# Patient Record
Sex: Female | Born: 1980
Health system: Southern US, Community
[De-identification: ages and names within clinical notes are randomized; demographics above are authoritative.]

## PROBLEM LIST (undated history)

## (undated) ENCOUNTER — Inpatient Hospital Stay (HOSPITAL_COMMUNITY): Payer: Self-pay

## (undated) DIAGNOSIS — Z9889 Other specified postprocedural states: Secondary | ICD-10-CM

## (undated) DIAGNOSIS — K219 Gastro-esophageal reflux disease without esophagitis: Secondary | ICD-10-CM

## (undated) DIAGNOSIS — IMO0002 Reserved for concepts with insufficient information to code with codable children: Secondary | ICD-10-CM

## (undated) DIAGNOSIS — I509 Heart failure, unspecified: Secondary | ICD-10-CM

## (undated) DIAGNOSIS — Z8489 Family history of other specified conditions: Secondary | ICD-10-CM

## (undated) DIAGNOSIS — J9690 Respiratory failure, unspecified, unspecified whether with hypoxia or hypercapnia: Secondary | ICD-10-CM

## (undated) DIAGNOSIS — J45909 Unspecified asthma, uncomplicated: Secondary | ICD-10-CM

## (undated) DIAGNOSIS — F419 Anxiety disorder, unspecified: Secondary | ICD-10-CM

## (undated) DIAGNOSIS — R112 Nausea with vomiting, unspecified: Secondary | ICD-10-CM

## (undated) DIAGNOSIS — J42 Unspecified chronic bronchitis: Secondary | ICD-10-CM

## (undated) DIAGNOSIS — G709 Myoneural disorder, unspecified: Secondary | ICD-10-CM

## (undated) DIAGNOSIS — G8929 Other chronic pain: Secondary | ICD-10-CM

## (undated) DIAGNOSIS — M549 Dorsalgia, unspecified: Secondary | ICD-10-CM

## (undated) DIAGNOSIS — T4145XA Adverse effect of unspecified anesthetic, initial encounter: Secondary | ICD-10-CM

## (undated) DIAGNOSIS — G43909 Migraine, unspecified, not intractable, without status migrainosus: Secondary | ICD-10-CM

## (undated) DIAGNOSIS — C539 Malignant neoplasm of cervix uteri, unspecified: Secondary | ICD-10-CM

## (undated) DIAGNOSIS — J189 Pneumonia, unspecified organism: Secondary | ICD-10-CM

## (undated) DIAGNOSIS — T8859XA Other complications of anesthesia, initial encounter: Secondary | ICD-10-CM

## (undated) HISTORY — PX: CERVICAL CONE BIOPSY: SUR198

## (undated) HISTORY — PX: KNEE ARTHROSCOPY: SHX127

## (undated) HISTORY — PX: ANTERIOR LUMBAR FUSION: SHX1170

## (undated) HISTORY — PX: TONSILLECTOMY: SUR1361

## (undated) HISTORY — PX: LUMBAR FUSION: SHX111

## (undated) HISTORY — PX: ADENOIDECTOMY, TONSILLECTOMY AND MYRINGOTOMY WITH TUBE PLACEMENT: SHX5716

## (undated) HISTORY — DX: Respiratory failure, unspecified, unspecified whether with hypoxia or hypercapnia: J96.90

---
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
## 1998-03-04 ENCOUNTER — Emergency Department (HOSPITAL_COMMUNITY): Admission: EM | Admit: 1998-03-04 | Discharge: 1998-03-04 | Payer: Self-pay | Admitting: Emergency Medicine

## 1998-03-04 ENCOUNTER — Encounter: Payer: Self-pay | Admitting: Emergency Medicine

## 1998-05-22 ENCOUNTER — Emergency Department (HOSPITAL_COMMUNITY): Admission: EM | Admit: 1998-05-22 | Discharge: 1998-05-22 | Payer: Self-pay | Admitting: Emergency Medicine

## 1998-05-22 ENCOUNTER — Encounter: Payer: Self-pay | Admitting: Emergency Medicine

## 1998-06-01 ENCOUNTER — Encounter: Payer: Self-pay | Admitting: Neurology

## 1998-06-01 ENCOUNTER — Ambulatory Visit (HOSPITAL_COMMUNITY): Admission: RE | Admit: 1998-06-01 | Discharge: 1998-06-01 | Payer: Self-pay | Admitting: Neurology

## 1998-06-15 HISTORY — PX: LUMBAR DISC SURGERY: SHX700

## 1998-07-06 ENCOUNTER — Inpatient Hospital Stay (HOSPITAL_COMMUNITY): Admission: RE | Admit: 1998-07-06 | Discharge: 1998-07-09 | Payer: Self-pay | Admitting: Neurosurgery

## 1998-09-14 ENCOUNTER — Encounter: Admission: RE | Admit: 1998-09-14 | Discharge: 1998-12-13 | Payer: Self-pay | Admitting: Neurosurgery

## 1998-10-10 ENCOUNTER — Ambulatory Visit (HOSPITAL_COMMUNITY): Admission: RE | Admit: 1998-10-10 | Discharge: 1998-10-10 | Payer: Self-pay | Admitting: Neurosurgery

## 1998-10-26 ENCOUNTER — Inpatient Hospital Stay (HOSPITAL_COMMUNITY): Admission: RE | Admit: 1998-10-26 | Discharge: 1998-10-30 | Payer: Self-pay | Admitting: Neurosurgery

## 1998-11-10 ENCOUNTER — Encounter: Admission: RE | Admit: 1998-11-10 | Discharge: 1998-11-10 | Payer: Self-pay | Admitting: Obstetrics & Gynecology

## 1998-12-04 ENCOUNTER — Other Ambulatory Visit: Admission: RE | Admit: 1998-12-04 | Discharge: 1998-12-04 | Payer: Self-pay | Admitting: Obstetrics

## 1998-12-04 ENCOUNTER — Encounter (INDEPENDENT_AMBULATORY_CARE_PROVIDER_SITE_OTHER): Payer: Self-pay

## 1998-12-15 ENCOUNTER — Emergency Department (HOSPITAL_COMMUNITY): Admission: EM | Admit: 1998-12-15 | Discharge: 1998-12-15 | Payer: Self-pay | Admitting: Emergency Medicine

## 1999-01-12 ENCOUNTER — Encounter: Admission: RE | Admit: 1999-01-12 | Discharge: 1999-01-12 | Payer: Self-pay | Admitting: Neurosurgery

## 1999-03-30 ENCOUNTER — Encounter: Admission: RE | Admit: 1999-03-30 | Discharge: 1999-03-30 | Payer: Self-pay | Admitting: Neurosurgery

## 1999-04-09 ENCOUNTER — Encounter: Admission: RE | Admit: 1999-04-09 | Discharge: 1999-04-09 | Payer: Self-pay | Admitting: Obstetrics & Gynecology

## 1999-05-03 ENCOUNTER — Ambulatory Visit (HOSPITAL_COMMUNITY): Admission: RE | Admit: 1999-05-03 | Discharge: 1999-05-03 | Payer: Self-pay | Admitting: Obstetrics & Gynecology

## 1999-05-03 ENCOUNTER — Encounter (INDEPENDENT_AMBULATORY_CARE_PROVIDER_SITE_OTHER): Payer: Self-pay | Admitting: Specialist

## 1999-05-06 ENCOUNTER — Inpatient Hospital Stay (HOSPITAL_COMMUNITY): Admission: EM | Admit: 1999-05-06 | Discharge: 1999-05-06 | Payer: Self-pay | Admitting: Obstetrics & Gynecology

## 1999-05-18 ENCOUNTER — Encounter: Admission: RE | Admit: 1999-05-18 | Discharge: 1999-05-18 | Payer: Self-pay | Admitting: Obstetrics & Gynecology

## 1999-06-17 ENCOUNTER — Ambulatory Visit (HOSPITAL_COMMUNITY): Admission: RE | Admit: 1999-06-17 | Discharge: 1999-06-17 | Payer: Self-pay | Admitting: Neurosurgery

## 1999-07-06 ENCOUNTER — Encounter: Admission: RE | Admit: 1999-07-06 | Discharge: 1999-07-06 | Payer: Self-pay | Admitting: Obstetrics & Gynecology

## 1999-09-30 ENCOUNTER — Encounter: Admission: RE | Admit: 1999-09-30 | Discharge: 1999-09-30 | Payer: Self-pay | Admitting: Obstetrics

## 1999-12-23 ENCOUNTER — Encounter: Admission: RE | Admit: 1999-12-23 | Discharge: 1999-12-23 | Payer: Self-pay | Admitting: Obstetrics & Gynecology

## 1999-12-23 ENCOUNTER — Encounter: Admission: RE | Admit: 1999-12-23 | Discharge: 1999-12-23 | Payer: Self-pay | Admitting: Obstetrics

## 2000-02-01 ENCOUNTER — Encounter: Admission: RE | Admit: 2000-02-01 | Discharge: 2000-02-01 | Payer: Self-pay | Admitting: Obstetrics & Gynecology

## 2000-02-01 ENCOUNTER — Other Ambulatory Visit: Admission: RE | Admit: 2000-02-01 | Discharge: 2000-02-01 | Payer: Self-pay | Admitting: Obstetrics & Gynecology

## 2000-02-28 ENCOUNTER — Encounter (INDEPENDENT_AMBULATORY_CARE_PROVIDER_SITE_OTHER): Payer: Self-pay | Admitting: Specialist

## 2000-02-28 ENCOUNTER — Ambulatory Visit (HOSPITAL_COMMUNITY): Admission: RE | Admit: 2000-02-28 | Discharge: 2000-02-28 | Payer: Self-pay | Admitting: Obstetrics & Gynecology

## 2000-04-21 ENCOUNTER — Encounter: Admission: RE | Admit: 2000-04-21 | Discharge: 2000-04-21 | Payer: Self-pay | Admitting: Obstetrics & Gynecology

## 2000-06-19 ENCOUNTER — Encounter: Payer: Self-pay | Admitting: Emergency Medicine

## 2000-06-19 ENCOUNTER — Emergency Department (HOSPITAL_COMMUNITY): Admission: EM | Admit: 2000-06-19 | Discharge: 2000-06-19 | Payer: Self-pay | Admitting: Emergency Medicine

## 2000-07-13 ENCOUNTER — Encounter: Admission: RE | Admit: 2000-07-13 | Discharge: 2000-07-13 | Payer: Self-pay | Admitting: Obstetrics

## 2000-09-09 ENCOUNTER — Ambulatory Visit (HOSPITAL_COMMUNITY): Admission: RE | Admit: 2000-09-09 | Discharge: 2000-09-09 | Payer: Self-pay | Admitting: Neurosurgery

## 2000-09-26 ENCOUNTER — Ambulatory Visit (HOSPITAL_COMMUNITY): Admission: RE | Admit: 2000-09-26 | Discharge: 2000-09-26 | Payer: Self-pay | Admitting: Neurosurgery

## 2000-10-25 ENCOUNTER — Other Ambulatory Visit: Admission: RE | Admit: 2000-10-25 | Discharge: 2000-10-25 | Payer: Self-pay | Admitting: *Deleted

## 2000-10-27 ENCOUNTER — Encounter: Admission: RE | Admit: 2000-10-27 | Discharge: 2001-01-25 | Payer: Self-pay

## 2001-02-09 ENCOUNTER — Encounter: Admission: RE | Admit: 2001-02-09 | Discharge: 2001-05-10 | Payer: Self-pay | Admitting: Anesthesiology

## 2001-03-13 ENCOUNTER — Encounter: Admission: RE | Admit: 2001-03-13 | Discharge: 2001-03-13 | Payer: Self-pay | Admitting: Obstetrics & Gynecology

## 2001-04-30 ENCOUNTER — Encounter: Payer: Self-pay | Admitting: Emergency Medicine

## 2001-04-30 ENCOUNTER — Emergency Department (HOSPITAL_COMMUNITY): Admission: EM | Admit: 2001-04-30 | Discharge: 2001-04-30 | Payer: Self-pay

## 2001-05-15 ENCOUNTER — Encounter (INDEPENDENT_AMBULATORY_CARE_PROVIDER_SITE_OTHER): Payer: Self-pay | Admitting: *Deleted

## 2001-05-15 ENCOUNTER — Encounter: Admission: RE | Admit: 2001-05-15 | Discharge: 2001-05-15 | Payer: Self-pay | Admitting: *Deleted

## 2001-05-29 ENCOUNTER — Encounter: Admission: RE | Admit: 2001-05-29 | Discharge: 2001-05-29 | Payer: Self-pay | Admitting: *Deleted

## 2001-08-21 ENCOUNTER — Encounter: Admission: RE | Admit: 2001-08-21 | Discharge: 2001-08-21 | Payer: Self-pay | Admitting: *Deleted

## 2001-11-13 ENCOUNTER — Encounter: Admission: RE | Admit: 2001-11-13 | Discharge: 2001-11-13 | Payer: Self-pay | Admitting: *Deleted

## 2002-01-15 ENCOUNTER — Emergency Department (HOSPITAL_COMMUNITY): Admission: EM | Admit: 2002-01-15 | Discharge: 2002-01-15 | Payer: Self-pay | Admitting: Emergency Medicine

## 2002-02-03 ENCOUNTER — Emergency Department (HOSPITAL_COMMUNITY): Admission: EM | Admit: 2002-02-03 | Discharge: 2002-02-03 | Payer: Self-pay | Admitting: Emergency Medicine

## 2002-02-21 ENCOUNTER — Emergency Department (HOSPITAL_COMMUNITY): Admission: EM | Admit: 2002-02-21 | Discharge: 2002-02-21 | Payer: Self-pay | Admitting: Emergency Medicine

## 2002-03-21 ENCOUNTER — Encounter: Payer: Self-pay | Admitting: Emergency Medicine

## 2002-03-21 ENCOUNTER — Emergency Department (HOSPITAL_COMMUNITY): Admission: EM | Admit: 2002-03-21 | Discharge: 2002-03-21 | Payer: Self-pay | Admitting: Emergency Medicine

## 2002-04-04 ENCOUNTER — Other Ambulatory Visit: Admission: RE | Admit: 2002-04-04 | Discharge: 2002-04-04 | Payer: Self-pay | Admitting: Obstetrics and Gynecology

## 2002-04-04 ENCOUNTER — Encounter (INDEPENDENT_AMBULATORY_CARE_PROVIDER_SITE_OTHER): Payer: Self-pay | Admitting: *Deleted

## 2002-04-04 ENCOUNTER — Encounter: Admission: RE | Admit: 2002-04-04 | Discharge: 2002-04-04 | Payer: Self-pay | Admitting: Obstetrics and Gynecology

## 2002-05-01 ENCOUNTER — Ambulatory Visit: Admission: RE | Admit: 2002-05-01 | Discharge: 2002-05-01 | Payer: Self-pay | Admitting: Gynecologic Oncology

## 2002-07-30 ENCOUNTER — Encounter: Payer: Self-pay | Admitting: Emergency Medicine

## 2002-07-30 ENCOUNTER — Emergency Department (HOSPITAL_COMMUNITY): Admission: EM | Admit: 2002-07-30 | Discharge: 2002-07-30 | Payer: Self-pay | Admitting: Emergency Medicine

## 2003-08-19 ENCOUNTER — Emergency Department (HOSPITAL_COMMUNITY): Admission: EM | Admit: 2003-08-19 | Discharge: 2003-08-19 | Payer: Self-pay | Admitting: Emergency Medicine

## 2004-02-15 DIAGNOSIS — I509 Heart failure, unspecified: Secondary | ICD-10-CM

## 2004-02-15 HISTORY — DX: Heart failure, unspecified: I50.9

## 2004-03-03 ENCOUNTER — Ambulatory Visit (HOSPITAL_COMMUNITY): Admission: RE | Admit: 2004-03-03 | Discharge: 2004-03-03 | Payer: Self-pay | Admitting: Obstetrics

## 2004-03-15 ENCOUNTER — Inpatient Hospital Stay (HOSPITAL_COMMUNITY): Admission: AD | Admit: 2004-03-15 | Discharge: 2004-03-15 | Payer: Self-pay | Admitting: Obstetrics & Gynecology

## 2004-06-28 ENCOUNTER — Ambulatory Visit (HOSPITAL_COMMUNITY): Admission: RE | Admit: 2004-06-28 | Discharge: 2004-06-28 | Payer: Self-pay | Admitting: Obstetrics & Gynecology

## 2004-09-08 ENCOUNTER — Ambulatory Visit (HOSPITAL_COMMUNITY): Admission: RE | Admit: 2004-09-08 | Discharge: 2004-09-08 | Payer: Self-pay | Admitting: Obstetrics & Gynecology

## 2004-09-18 ENCOUNTER — Inpatient Hospital Stay (HOSPITAL_COMMUNITY): Admission: AD | Admit: 2004-09-18 | Discharge: 2004-09-22 | Payer: Self-pay | Admitting: Obstetrics

## 2004-09-19 ENCOUNTER — Encounter (INDEPENDENT_AMBULATORY_CARE_PROVIDER_SITE_OTHER): Payer: Self-pay | Admitting: *Deleted

## 2004-09-28 ENCOUNTER — Inpatient Hospital Stay (HOSPITAL_COMMUNITY): Admission: AD | Admit: 2004-09-28 | Discharge: 2004-10-01 | Payer: Self-pay | Admitting: Obstetrics & Gynecology

## 2004-09-28 ENCOUNTER — Encounter (INDEPENDENT_AMBULATORY_CARE_PROVIDER_SITE_OTHER): Payer: Self-pay | Admitting: Cardiovascular Disease

## 2004-09-28 ENCOUNTER — Encounter: Payer: Self-pay | Admitting: Emergency Medicine

## 2004-09-30 ENCOUNTER — Encounter: Payer: Self-pay | Admitting: Cardiovascular Disease

## 2005-05-18 ENCOUNTER — Encounter: Admission: RE | Admit: 2005-05-18 | Discharge: 2005-05-18 | Payer: Self-pay | Admitting: *Deleted

## 2005-08-31 ENCOUNTER — Emergency Department (HOSPITAL_COMMUNITY): Admission: EM | Admit: 2005-08-31 | Discharge: 2005-08-31 | Payer: Self-pay | Admitting: Emergency Medicine

## 2005-09-22 ENCOUNTER — Emergency Department (HOSPITAL_COMMUNITY): Admission: EM | Admit: 2005-09-22 | Discharge: 2005-09-22 | Payer: Self-pay | Admitting: Emergency Medicine

## 2006-02-02 ENCOUNTER — Emergency Department (HOSPITAL_COMMUNITY): Admission: EM | Admit: 2006-02-02 | Discharge: 2006-02-02 | Payer: Self-pay | Admitting: Emergency Medicine

## 2006-02-04 ENCOUNTER — Emergency Department (HOSPITAL_COMMUNITY): Admission: EM | Admit: 2006-02-04 | Discharge: 2006-02-05 | Payer: Self-pay | Admitting: Emergency Medicine

## 2006-03-12 ENCOUNTER — Emergency Department (HOSPITAL_COMMUNITY): Admission: EM | Admit: 2006-03-12 | Discharge: 2006-03-12 | Payer: Self-pay | Admitting: Emergency Medicine

## 2006-04-15 LAB — HM MAMMOGRAPHY: HM Mammogram: NEGATIVE

## 2006-05-14 ENCOUNTER — Emergency Department (HOSPITAL_COMMUNITY): Admission: EM | Admit: 2006-05-14 | Discharge: 2006-05-14 | Payer: Self-pay | Admitting: Emergency Medicine

## 2006-05-17 ENCOUNTER — Emergency Department (HOSPITAL_COMMUNITY): Admission: EM | Admit: 2006-05-17 | Discharge: 2006-05-18 | Payer: Self-pay | Admitting: Emergency Medicine

## 2006-05-23 ENCOUNTER — Emergency Department (HOSPITAL_COMMUNITY): Admission: EM | Admit: 2006-05-23 | Discharge: 2006-05-23 | Payer: Self-pay | Admitting: Emergency Medicine

## 2006-05-24 ENCOUNTER — Encounter: Admission: RE | Admit: 2006-05-24 | Discharge: 2006-05-24 | Payer: Self-pay | Admitting: *Deleted

## 2006-05-26 ENCOUNTER — Ambulatory Visit (HOSPITAL_COMMUNITY): Admission: RE | Admit: 2006-05-26 | Discharge: 2006-05-26 | Payer: Self-pay | Admitting: Emergency Medicine

## 2006-05-26 ENCOUNTER — Encounter: Admission: RE | Admit: 2006-05-26 | Discharge: 2006-05-26 | Payer: Self-pay | Admitting: *Deleted

## 2006-07-13 ENCOUNTER — Emergency Department (HOSPITAL_COMMUNITY): Admission: EM | Admit: 2006-07-13 | Discharge: 2006-07-14 | Payer: Self-pay | Admitting: Emergency Medicine

## 2006-07-24 ENCOUNTER — Encounter (INDEPENDENT_AMBULATORY_CARE_PROVIDER_SITE_OTHER): Payer: Self-pay | Admitting: Diagnostic Radiology

## 2006-07-24 ENCOUNTER — Encounter: Admission: RE | Admit: 2006-07-24 | Discharge: 2006-07-24 | Payer: Self-pay | Admitting: Family Medicine

## 2006-08-14 ENCOUNTER — Inpatient Hospital Stay (HOSPITAL_COMMUNITY): Admission: AD | Admit: 2006-08-14 | Discharge: 2006-08-14 | Payer: Self-pay | Admitting: Obstetrics & Gynecology

## 2006-08-23 ENCOUNTER — Ambulatory Visit: Payer: Self-pay | Admitting: Obstetrics and Gynecology

## 2006-09-01 ENCOUNTER — Ambulatory Visit: Payer: Self-pay | Admitting: Obstetrics & Gynecology

## 2006-11-17 ENCOUNTER — Encounter: Admission: RE | Admit: 2006-11-17 | Discharge: 2006-12-11 | Payer: Self-pay | Admitting: Neurosurgery

## 2006-12-07 ENCOUNTER — Emergency Department (HOSPITAL_COMMUNITY): Admission: EM | Admit: 2006-12-07 | Discharge: 2006-12-07 | Payer: Self-pay | Admitting: Emergency Medicine

## 2006-12-21 ENCOUNTER — Ambulatory Visit: Payer: Self-pay | Admitting: Cardiology

## 2007-01-08 ENCOUNTER — Ambulatory Visit (HOSPITAL_COMMUNITY): Admission: RE | Admit: 2007-01-08 | Discharge: 2007-01-08 | Payer: Self-pay | Admitting: Neurosurgery

## 2007-08-01 ENCOUNTER — Encounter (INDEPENDENT_AMBULATORY_CARE_PROVIDER_SITE_OTHER): Payer: Self-pay | Admitting: Orthopedic Surgery

## 2007-08-01 ENCOUNTER — Ambulatory Visit: Payer: Self-pay

## 2007-08-07 ENCOUNTER — Inpatient Hospital Stay (HOSPITAL_COMMUNITY): Admission: RE | Admit: 2007-08-07 | Discharge: 2007-08-12 | Payer: Self-pay | Admitting: Orthopedic Surgery

## 2008-03-25 ENCOUNTER — Encounter: Admission: RE | Admit: 2008-03-25 | Discharge: 2008-03-25 | Payer: Self-pay | Admitting: Orthopedic Surgery

## 2008-09-08 ENCOUNTER — Encounter: Admission: RE | Admit: 2008-09-08 | Discharge: 2008-09-08 | Payer: Self-pay | Admitting: Orthopedic Surgery

## 2008-10-27 ENCOUNTER — Emergency Department (HOSPITAL_COMMUNITY): Admission: EM | Admit: 2008-10-27 | Discharge: 2008-10-27 | Payer: Self-pay | Admitting: Emergency Medicine

## 2009-01-13 ENCOUNTER — Inpatient Hospital Stay (HOSPITAL_COMMUNITY): Admission: RE | Admit: 2009-01-13 | Discharge: 2009-01-16 | Payer: Self-pay | Admitting: Ophthalmology

## 2009-10-17 ENCOUNTER — Emergency Department (HOSPITAL_COMMUNITY): Admission: EM | Admit: 2009-10-17 | Discharge: 2009-10-17 | Payer: Self-pay | Admitting: Emergency Medicine

## 2009-11-13 ENCOUNTER — Emergency Department (HOSPITAL_COMMUNITY): Admission: EM | Admit: 2009-11-13 | Discharge: 2009-11-13 | Payer: Self-pay | Admitting: Emergency Medicine

## 2010-03-07 ENCOUNTER — Encounter: Payer: Self-pay | Admitting: Obstetrics & Gynecology

## 2010-03-07 ENCOUNTER — Encounter: Payer: Self-pay | Admitting: Orthopedic Surgery

## 2010-03-07 ENCOUNTER — Encounter: Payer: Self-pay | Admitting: Obstetrics

## 2010-03-08 ENCOUNTER — Encounter: Payer: Self-pay | Admitting: Orthopedic Surgery

## 2010-05-18 LAB — BASIC METABOLIC PANEL
BUN: 3 mg/dL — ABNORMAL LOW (ref 6–23)
CO2: 25 mEq/L (ref 19–32)
CO2: 28 mEq/L (ref 19–32)
Calcium: 8.1 mg/dL — ABNORMAL LOW (ref 8.4–10.5)
Calcium: 8.1 mg/dL — ABNORMAL LOW (ref 8.4–10.5)
Calcium: 8.3 mg/dL — ABNORMAL LOW (ref 8.4–10.5)
Chloride: 110 mEq/L (ref 96–112)
Chloride: 111 mEq/L (ref 96–112)
Creatinine, Ser: 0.54 mg/dL (ref 0.4–1.2)
Creatinine, Ser: 0.62 mg/dL (ref 0.4–1.2)
Creatinine, Ser: 0.67 mg/dL (ref 0.4–1.2)
GFR calc Af Amer: >60 mL/min (ref >60)
GFR calc Af Amer: >60 mL/min (ref >60)
GFR calc non Af Amer: >60 mL/min (ref >60)
Glucose, Bld: 124 mg/dL — ABNORMAL HIGH (ref 70–99)
Sodium: 141 mEq/L (ref 135–145)
Sodium: 141 mEq/L (ref 135–145)

## 2010-05-18 LAB — CBC
Hemoglobin: 9.7 g/dL — ABNORMAL LOW (ref 12.0–15.0)
MCHC: 33.7 g/dL (ref 30.0–36.0)
MCHC: 34.4 g/dL (ref 30.0–36.0)
MCV: 96 fL (ref 78.0–100.0)
Platelets: 182 10*3/uL (ref 150–400)
RBC: 2.98 MIL/uL — ABNORMAL LOW (ref 3.87–5.11)
RBC: 2.99 MIL/uL — ABNORMAL LOW (ref 3.87–5.11)
RBC: 3.19 MIL/uL — ABNORMAL LOW (ref 3.87–5.11)
RDW: 12.9 % (ref 11.5–15.5)
WBC: 7.4 10*3/uL (ref 4.0–10.5)

## 2010-05-18 LAB — VITAMIN D 25 HYDROXY (VIT D DEFICIENCY, FRACTURES): Vit D, 25-Hydroxy: 21 ng/mL — ABNORMAL LOW (ref 30–89)

## 2010-05-19 LAB — DIFFERENTIAL
Basophils Absolute: 0.1 10*3/uL (ref 0.0–0.1)
Eosinophils Relative: 6 % — ABNORMAL HIGH (ref 0–5)
Lymphocytes Relative: 39 % (ref 12–46)
Neutro Abs: 3.3 10*3/uL (ref 1.7–7.7)
Neutrophils Relative %: 48 % (ref 43–77)

## 2010-05-19 LAB — CBC
HCT: 40.5 % (ref 36.0–46.0)
MCV: 93.3 fL (ref 78.0–100.0)
RBC: 4.34 MIL/uL (ref 3.87–5.11)
WBC: 6.9 10*3/uL (ref 4.0–10.5)

## 2010-05-19 LAB — COMPREHENSIVE METABOLIC PANEL
BUN: 13 mg/dL (ref 6–23)
CO2: 24 mEq/L (ref 19–32)
Chloride: 108 mEq/L (ref 96–112)
Creatinine, Ser: 0.74 mg/dL (ref 0.4–1.2)
GFR calc non Af Amer: >60 mL/min (ref >60)
Glucose, Bld: 100 mg/dL — ABNORMAL HIGH (ref 70–99)
Total Bilirubin: 0.3 mg/dL (ref 0.3–1.2)

## 2010-05-19 LAB — URINALYSIS, ROUTINE W REFLEX MICROSCOPIC
Bilirubin Urine: NEGATIVE
Hgb urine dipstick: NEGATIVE
Specific Gravity, Urine: 1.028 (ref 1.005–1.030)
Urobilinogen, UA: 0.2 mg/dL (ref 0.0–1.0)

## 2010-05-19 LAB — PROTIME-INR: INR: 0.95 (ref 0.00–1.49)

## 2010-05-19 LAB — HCG, SERUM, QUALITATIVE: Preg, Serum: NEGATIVE

## 2010-05-19 LAB — TYPE AND SCREEN: Antibody Screen: NEGATIVE

## 2010-05-19 LAB — APTT: aPTT: 24 seconds (ref 24–37)

## 2010-06-29 NOTE — Assessment & Plan Note (Signed)
[facility]                            CARDIOLOGY OFFICE NOTE   [name], [name]                 MRN:          [id number]  DATE:12/21/2006                            DOB:          1980/10/10    REFERRING PHYSICIAN:  [name], M.D.   REASON FOR CONSULTATION:  History of cardiomyopathy.   HISTORY OF PRESENT ILLNESS:  The patient is a 30 year old woman  previously followed by [name], M.D. with a history of mild  cardiomyopathy documented by echocardiography.  I do not have complete  information at hand, but based on what I have been able to determine,  the patient was diagnosed with a mild cardiomyopathy soon after the  delivery of her son back in August of 2006.  An echocardiogram performed  at that time and read by Dr. [name] demonstrated an ejection fraction  of 40-50% with moderate hypokinesis of the mid septum.  No other major  valvular abnormalities were noted.  The patient was treated medically  and also referred for an inpatient Myoview which was performed in August  of 2006 demonstrating at that time a normal left ventricular ejection  fraction of 57% with no evidence of inducible ischemia.  The patient  states that she saw Dr. [name] back in the office until October of 2006  and then reports that she became uncomfortable continuing follow-up  there and stopped all of her medications.  She states that she has not  had any cardiac follow-up since that time.   The patient states that she was referred here due to concerns about a  low blood pressure although the most recent available records indicate  a blood pressure recorded at 112/76.   She states that there was  concern about whether she needed to continue on the medicines she was  previously placed on by Dr. [name] and comes in today to discuss this  issue further.   In terms of symptoms, the patient does describe occasional chest  tightness and wheezing  and reports a known history of asthma for which  she uses p.r.n. albuterol.  She states that she has not smoked since  2006.  She reports that these symptoms have not changed in terms of  frequency or consistency.  She otherwise has no progressive dyspnea on  exertion, orthopnea, PND, or lower extremity edema.  Her  electrocardiogram today in the office is normal showing sinus rhythm at  95 beats per minute.   After reviewing the available records and speaking with the patient, it  is certainly possible that what she was noted to have back in 2006 was a  mild peripartum cardiomyopathy.  I do not see this clearly delineated,  but would be suspicious of such.  She reports that she has no plans of  being pregnant again and is on Depo-Provera for prophylaxis.  Of note  however, the Myoview assessment of ejection fraction at that time was  normal.   ALLERGIES:  No known drug allergies.   CURRENT MEDICATIONS:  1. Depo-Provera.  2. Hydrocodone.  3. Valium.  4. Multivitamin.   PAST MEDICAL HISTORY:  As outlined above.  The patient reports previous  back and knee surgery in 2000 and also 2005.  No history of  cardiovascular disease with normal Myoview in 2006.  No obvious history  of diabetes mellitus or hyperlipidemia.   FAMILY HISTORY:  The patient reports that her brother had a myocardial  infarction at age 11 in the setting of methamphetamine use and crack  cocaine use.  She reports that her father in his 12's has heart  problems and high blood pressure.  No obvious other premature  cardiovascular disease identified.   REVIEW OF SYSTEMS:  As described in history of present illness.  The  patient has problems with seasonal allergies and asthma as already  indicated.  She complains of general fatigue at times.  Otherwise  noncontributory.   SOCIAL HISTORY:  The patient has one child.  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  She previously smoked cigarettes but quit in January of  2006.  She denies any recreational drug use or alcohol use at this time.  She  drinks one to two cans of caffeinated beverage a day.  She is not  exercising.   PHYSICAL EXAMINATION:  VITAL SIGNS:  Blood pressure today 121/80, heart  rate 94, weight 204 pounds.  With a chaperone the patient was otherwise  examined and found to be overweight.  HEENT:  Conjunctivae and lids are normal.  Oropharynx clear.  NECK:  Supple.  No elevated jugular venous pressure.  No loud bruits.  No thyromegaly or thyroid tenderness.  LUNGS:  Clear.  Unlabored breathing.  No wheezing noted.  HEART:  Regular rate and rhythm.  No loud murmur.  No S3 gallop or  pericardial rub.  ABDOMEN:  Nontender.  Normal active bowel sounds.  EXTREMITIES:  No pitting edema.  Distal pulses are 2+.  SKIN:  Warm and dry.  MUSCULOSKELETAL:  No kyphosis is noted.  NEUROPSYCHIATRIC:  The patient is alert and oriented x3.   IMPRESSION:  1. Previously documented left ventricular ejection fraction of 40-50%      based on echocardiography in 2006, possibly indicating a mild      peripartum cardiomyopathy, although of note the patient had a      normal ejection fraction of 57% by myocardial perfusion study at      the same time.  In any event she was treated medically by Dr.      [name] and had limited follow-up, although has been on no specific      medications over the last two years.  She has some symptoms of      shortness of breath, chest tightness, and wheezing in the setting      of asthma and reports no change in pattern or frequency with this      over the last few years.  She reports similar symptoms at the time      when she had a normal Myoview without evidence of active ischemia.      Her blood pressure is relatively normal at this point and I am not      aware of any other major cardiac risk factors.  She has stopped      smoking.  She is not certain whether she has had a cholesterol      obtained or not.  I discussed  all of these issues with her today      and I have recommended a follow-up two-dimensional echocardiogram      to reassess  cardiac structure and function.  If this study reveals      normal left ventricular function, I would anticipate basic risk      factor modification strategies.  In that case, she could continue      to follow up with Dr. Tamela Oddi and have a lipid profile      obtained.  Clearly continuing with smoking cessation is beneficial      and a regular exercise regimen would also be considered.  If she      has evidence of cardiomyopathy then we can discuss further medical      therapy.  2. We will inform the patient of her echocardiogram results and if      normal, anticipate continued follow-up with Dr. Tamela Oddi.      Otherwise we will plan to see her back in the office.     Jonelle Sidle, MD  Electronically Signed    SGM/MedQ  DD: 12/21/2006  DT: 12/21/2006  Job #: [id number]   cc:   [name], M.D.

## 2010-06-29 NOTE — Assessment & Plan Note (Signed)
NAMEMADALINE, LEFEBER NO.:  [id number]   MEDICAL RECORD NO.:  [id number]          PATIENT TYPE:  WOC   LOCATION:  [facility]         FACILITY:  [facility]   PHYSICIAN:  Elsie Lincoln, MD      DATE OF BIRTH:  29-May-1980   DATE OF SERVICE:  08/23/2006                                  CLINIC NOTE   The patient is a 30 year old female with chronic pelvic pain and normal  pelvic ultrasound.  Her IUD is in place; and she thinks that this could  possibly be contributing to her pelvic pain.  She would like it removed.  However, today, when we placed a speculum the string could not be  visualized.  We tried to straighten the uterine cervical canal by  placing a single-tooth tenaculum on the anterior lip of the cervix.   The patient was unable to tolerate any manipulation; and so she would  like to go to the operating room.  We will proceed with this.  She does  have a history of being on lots of narcotics, for her pelvic pain, so  her pain tolerance is rather low; anesthesia needs aware of this.  We  will schedule her shortly for hysteroscopy and removal of IUD.  H&P to  be done the day of surgery.           ______________________________  Elsie Lincoln, MD     KL/MEDQ  D:  08/23/2006  T:  08/24/2006  Job:  [id number]

## 2010-06-29 NOTE — Op Note (Signed)
NAMEALANNIE, [name] NO.:  [id number]   MEDICAL RECORD NO.:  [id number]          PATIENT TYPE:  INP   LOCATION:  3022                         FACILITY:  [facility]   PHYSICIAN:  Nelda Severe, MD      DATE OF BIRTH:  [birth year]   DATE OF PROCEDURE:  08/07/2007  DATE OF DISCHARGE:                               OPERATIVE REPORT   SURGEON:  Nelda Severe, MD.   ASSISTANT:  Lianne Cure, PA-C.   PREOPERATIVE DIAGNOSES:  1. Status post L5-S1 posterior body fusion with Ray cages, possible      pseudoarthrosis.  2. Lumbar spondylosis.  3. Right lumbar scoliosis.   POSTOPERATIVE DIAGNOSES:  1. Status post L5-S1 fusion - solid.  2. Lumbar spondylosis.  3. Lumbar scoliosis.   OPERATIVE NOTE:  The patient was placed under general endotracheal  anesthesia.  A Foley catheter was placed in the bladder.  Vancomycin was  infused prophylactically.  Sequential compression devices were placed on  both lower extremities.  The patient was positioned prone on the [place]  frame.  Care was taken to position the upper extremities so as to avoid  hyperflexion and abduction of the shoulders and so as to avoid  hyperflexion of the elbows.  The upper extremities were padded with foam  from axillae to hands.  The knees, shins, ankle, and thighs were  supported on pillows.   The proposed midline skin incision was marked with a skin marker  incorporating the previous posterior incision in an ellipse.  The lumbar  area was prepared using DuraPrep and draped in rectangular fashion.  The  drapes were secured with Ioban.  The skin incision was scored into the  dermis with a scalpel including elliptical excision pattern for the  previous midline scar.  The subcutaneous tissue was injected with a  mixture of 0.25% plain Marcaine and 1% lidocaine with epinephrine.  The  incision was then deepened into the subcutaneous fat and the ellipse  excised using cutting current.  Incision was carried  down to the  thoracolumbar fascia.  The thoracolumbar fascia was then detached  bilaterally from T11 through S2 and mobilized using a cutting current  Cobb elevators.  A cross-table lateral radiograph was taken with a  Kocher on spinous process and I was not sure whether it was actually on  the L4 or L3 spinous process.  Ultimately, a hole was made in the  pedicle of what turned out to be L4 and a cross-table lateral radiograph  taken positively identifying the level.  It was evident that there was  motion at the L4-5 level, but not at the L5-S1 level.  Subsequently in  the surgery, it became also evident that the L5-S1 facet joints were  solidly fused on either side.   Once we had the exposure which in the lumbar area was out lateral to the  transverse processes from L1 through the sacral ala bilaterally, we made  pedicle holes.  First on the right side, the pedicles were made starting  at T10 proximally and working our way distally to S1.  Each hole was  carefully palpated to  make sure it was circumferentially intact.  We  then placed screws from T10-S1.  4.5-mm diameter screws were used in the  thoracic and upper lumbar spine because the pedicles were extremely  small in cross-sectional diameter on the CT scan which had been done  preoperatively.  We subsequently stimulated each screw and distal EMG  activity was recorded.  In each instance, the amount of current  necessary to elicit distal EMG activity was above the threshold level  for concern.  In other words, it is highly unlikely, given the test  results that there would be any contact between screw and nerve root on  the right side at any level.   We then placed screws on the left side beginning of T10 proximally and  worked our way to S1.  The pedicles at L1-L2 were so small that although  I made an attempt to place a pedicle hole, I did not persist because it  was quite obvious that this would be technically impossible.   Therefore,  we did not place any screws at L1 and L2 on the left side.  Screws were  placed from T10 through S1.  In each case, each hole was carefully  palpated to make sure it was circumferentially intact.   We then took AP and lateral radiographs.  I was concerned about the  position of the right T10 screw and the right L1 screw.  The T10 screw  was removed and the hole palpated and there were no breaches of the  hole.  The screw was replaced.  I decided against removing the screw at  L1 because I was concerned that I would not have as good fixation  placing another screw and the only concern was that the tip might be in  the adjacent disk space,.   I then harvested a bone graft in the right posterior iliac crest through  a separate incision.  Incision was carried down onto the iliac crest.  The gluteal muscle was mobilized and a Cytogeneticist placed.  A  moderately large quantity of graft was harvested using an osteotome,  gouge, and acetabular reamer.  The bony void was packed with Gelfoam.  The closure was performed later in the case at the time of closure of  the central wound using three #1 Vicryl figure-of-eight interrupted  sutures.   Next, we contoured rods for both the right and left sides and placed  them provisionally.   Bone marrow, about 15 mL, was aspirated from the left posterior iliac  crest using an 18-gauge needle and mixed with 20 mL of beta-tricalcium  phosphate which was then mixed with the bone graft.  We also harvested  all the spinous processes and these were morselized.   Next, we used a high-speed bur to decorticate the lamina from T10-L5 and  to resect the facet joints and remove the articular surfaces of the  facet joints bilaterally from T10 through L4-5.  The beta-tricalcium  phosphate was mixed with the autogenous graft.  It was then packed in  posteriorly from T10-L5 and posterolaterally between the transverse  processes from L1-L5 bilaterally.   Cross-table and PA radiographs were  then taken which appeared satisfactory.  We then torqued all screws at  each level.   A 15-gauge Blake drain was then placed in the subfascial layer and  brought out through the skin to the right side in the central wound.  The thoracolumbar fascia was closed using continuous interrupted #1  Vicryl suture.  A 1/8th-inch Hemovac was placed in the subcutaneous  layer of the bone graft harvest wound into the central wound and out  through the skin proximally into the right side.  Both drains were  secured with 2-0 nylon in basket weave fashion.  The subcutaneous layer  in both wounds was then closed using multiple interrupted 0 and 2-0  Vicryl sutures.  The skin of both wounds was then closed using a  subcuticular 3-0 undyed Vicryl running suture.  The skin edges  reinforced with Steri-Strips.  Antibiotic ointment dressing was applied  and secured with OpSite.   The estimated blood loss 700 mL.  200 mL of Cell Saver blood (packed red  blood cells) was returned.  The sponge and needle counts were correct.  There were no intraoperative complications.   The patient was placed in her bed, awakened and transferred to recovery  room.  In the recovery room, she had good bilateral knee flexion, foot  and ankle dorsiflexion, and plantar flexion.      Nelda Severe, MD  Electronically Signed     MT/MEDQ  D:  08/07/2007  T:  08/08/2007  Job:  [id number]

## 2010-06-29 NOTE — H&P (Signed)
[name], [name]          ACCOUNT NO.:  [id number]   MEDICAL RECORD NO.:  [id number]          PATIENT TYPE:  INP   LOCATION:  NA                           FACILITY:  [facility]   PHYSICIAN:  Nelda Severe, MD      DATE OF BIRTH:  [birth year]   DATE OF ADMISSION:  DATE OF DISCHARGE:                              HISTORY & PHYSICAL   This is a 30 year old female who has thoracolumbar pain with posterior  bilateral leg pain.   ALLERGIES:  She has no known drug allergies.   CURRENT MEDICATIONS:  Include hydrocodone 10, 1-2 q.4 p.r.n. for pain  control and Valium 10 mg 1 q.6 p.r.n. for anxiety.   PAST MEDICAL HISTORY:  Depression, anxiety, and asthma.   PAST SURGICAL HISTORY:  Knee scope in March 2005 and C-section.   SOCIAL HISTORY:  She does smoke about 6 cigarettes a day.  She is trying  to quit.   REVIEW OF SYSTEMS:  She report shortness of breath at times with  activities, wheezing.  No fever or chills.  No bowel or bladder  incontinence.  No hemoptysis.  No chest pain.  No seizures, otherwise  negative.   PHYSICAL EXAMINATION:  VITAL SIGNS:  Today, she is 5 foot 5, weighs 205  pounds, her temperature is 98.7, pulse 85, respirations 18, and blood  pressure 119/84.  HEENT:  She is normocephalic.  In appearance, alert, and oriented x3.  pupils equal, round, and reactive to light.  NECK:  Supple.  No adenopathy noted.  Full range of motion.  CHEST:  Clear to auscultation.  There is no wheezing noted today.  HEART:  Regular rate and rhythm.  No murmurs noted.  ABDOMEN:  Soft and nontender to palpation.  Positive bowel sounds are  auscultated.  SKIN:  Clean and dry.  No rashes noted.  NEURO:  Grossly, she is neurovascularly motor intact, bilateral lower  extremities.   DIAGNOSES:  Scoliosis, spondylosis, and thoracolumbar spine.   PLAN:  Lumbar fusion T10 through S1 with iliac crest bone graft.      Lianne Cure, P.A.      Nelda Severe, MD  Electronically  Signed    MC/MEDQ  D:  08/01/2007  T:  08/02/2007  Job:  [id number]

## 2010-07-02 NOTE — Consult Note (Signed)
[facility]  Patient:    [name], [name] Visit Number: [id number] MRN: [id number]          Service Type: PMG Location: [facility] Attending Physician:  Sondra Come Dictated by:   Dr. Resa Miner. Date: 11/22/00 Admit Date:  10/27/2000                            Consultation Report  Ms. Shrieves returns to clinic today for re-evaluation.  She is status post left S1-2 transforaminal epidural steroid injection on 11/06/00, for persistent right lower extremity S1 radicular pain.  The patient states that she initially had no pain relief with the injection, however, on the third day she began to feel significant relief of the pain which she states lasted only one day.  She continues to take hydrocodone 7.5 mg b.i.d. or t.i.d., and has gone through 30 pills in two weeks.  She has not started taking Elavil as was prescribed on October 31, 2000, nor has she been to see the behavioral health psychologist.  She states that she is waiting to hear whether or not her insurance covers this.  Her pain today is a 6 to 7/10 on a subjective scale.  Her function and quality of life indices have improved modestly. Sleep is still fair to poor.  She continues to work.  She denies any bowel or bladder dysfunction.  Denies any new neurologic problems.  PHYSICAL EXAMINATION:  An obese female in no acute distress.  Increased lumbar lordosis.  Tenderness to palpation bilateral lumbar paraspinals.  Manual muscle testing is 5/5 bilateral lower extremities.  Sensory examination is decreased light touch on the posterior left thigh and posterolateral left calf and lateral foot.  Muscle stretch reflexes are 1+/4 bilateral patellar, medial hamstrings, and right Achilles, and 0/4 left Achilles.  Straight leg raising is equivocal on the left.  IMPRESSION:  Degenerative disc disease of the lumbar spine, status post L4-5 and L5-S1 diskectomies with L5-S1 fusion.  Persistent right  lower extremity radicular pain in the S1 distribution.  PLAN: 1. It is appropriate at this time to repeat the transforaminal epidural    steroid injection.  I would also consider adding an interlaminar epidural    injection at L5-S1 as well.  The patient states that she is not feeling    well today, and apparently was out late last night at a music concert.  She    requested to forego any further injections today, and wishes to reschedule    these at a later time. 2. A prescription for Lortab 7.5 mg/500 mg one p.o. b.i.d. p.r.n. severe pain.    The patient was counseled on the use of narcotic analgesia, and instructed    that this is for short-term use only, and she understands that 60 tabs    without refills will last her one month.  She understands that I will not    renew this prior to one month.  Long-term goal is to wean her from narcotic    analgesia, and she understands.  She understands that non-narcotic    alternatives and interventional procedures are to improve her symptoms and    function and decrease her narcotic use. 3. Restart Neurontin 300 mg one p.o. t.i.d. 4. The patient is instructed to fill her Elavil prescription for sleep. 5. The patient is instructed to follow with behavioral health psychology. 6. The patient will return to clinic in 1 to 2  weeks for repeat injections.  The patient was educated on the above findings and recommendations and understands.  There were no barriers to communication. Dictated by:   Dr. Andrey Campanile Attending Physician:  Sondra Come DD:  11/22/00 TD:  11/22/00 Job: [id number]

## 2010-07-02 NOTE — Discharge Summary (Signed)
[name], [name]          ACCOUNT NO.:  [id number]   MEDICAL RECORD NO.:  [id number]          PATIENT TYPE:  INP   LOCATION:  5022                         FACILITY:  [facility]   PHYSICIAN:  Nelda Severe, MD      DATE OF BIRTH:  06/24/1980   DATE OF ADMISSION:  08/07/2007  DATE OF DISCHARGE:  08/12/2007                               DISCHARGE SUMMARY   PREOPERATIVE DIAGNOSIS:  Lumbar spondylosis with scoliosis.   PROCEDURE:  T10-S1 lumbar fusion with iliac crest bone graft by Dr.  Nelda Severe.   POSTOPERATIVE BLOOD LOSS:  700 mL.   She was given 200 Cell Saver and packed red blood cells.  She was  grossly neurovascularly and motor intact postoperatively.  Postop day  #1, the patient was stable and afebrile.  Vital signs were stable.  Drain output was 365 mL total from JP and Hemovac.  Her hemoglobin was  stable at 9.0.  She is grossly neurovascularly motor intact.  Urinalysis  was done and it was negative for bacteria infection.  We did order a  TLSO brace from [employer].  She did have some nausea and was given  Phenergan, this resolved her nausea.  Post day #2, drains were kept in,  a total cc was out was 216, hemoglobin 8.8, afebrile.  Dressing was  clean, dry, and intact.  Grossly neurovascularly and motor intact.  She  had slight decreased sensation in right anterior thigh.  We KVO'd her  fluids and discontinued her Foley.  I want her to use the TLSO when out  of bed walking.  On August 10, 2007, the drains were discontinued.  Dry  dressing was placed over the wound.  The incision site was clean, dry,  and intact.  The patient was making advances through formal therapy with  min assist of donning and doffing the TLSO and ambulation.  Hemoglobin  dropped to 8.2 on August 11, 2007.  She was asymptomatic.  She had no  dizziness, no sweating.  She was able to ambulate.  She has been out of  bed with her brace on, now ambulating independently with standby.  On  August 12, 2007, she  had a T-max of 97, positive bowel sounds, eating a  regular diet, and she was deceased home with Norco 10 and rolling  walker, 3-in-1 toilet, and asked to follow up with Dr. Nelda Severe in  the office in approximately 4 weeks.  She is instructed to call her  local pharmacy for a refill on her medication.   DIAGNOSIS:  Lumbar spondylosis, scoliosis.   PLAN:  Walk for exercise, use a TLSO brace when up walking.  Follow up  in the four weeks' time.  Continue on her home medications to include a  multivitamin and Norco 10.  If she has troubles or concerns, she is  welcome to call us any time.   DISPOSITION:  Stable.   DIET:  Regular.      Lianne Cure, P.A.      Nelda Severe, MD  Electronically Signed    MC/MEDQ  D:  11/08/2007  T:  11/09/2007  Job:  [id number] 

## 2010-07-02 NOTE — Procedures (Signed)
[facility]  Patient:    [name], [name] Visit Number: [id number] MRN: [id number]          Service Type: PMG Location: [facility] Attending Physician:  Sondra Come Dictated by:   Sondra Come, D.O. Admit Date:  10/27/2000   CC:         Cristi Loron, M.D.   Procedure Report  HISTORY:  Ms. Gunkel returns to clinic today and is scheduled for a left S1 transforaminal epidural steroid injection.  The patient was initially seen on October 30, 2000 for low-back pain with left lower extremity S1 radicular symptoms.  The patient is status post L4-5 and L5-S1 diskectomy with subsequently L5-S1 fusion for a disk herniation.  The patient denies any interval change since last visit one week ago.  She continues to take Lortab 7.5 mg 2-3 times per day.  Her pain has essentially been unchanged.  She was given prescriptions for Elavil 25 mg at bedtime and one for [facility] psychology, which she has not filled yet secondary to a recent death in the family.  The patient denies bowel and bladder dysfunction.  Health and history form and 14-point review of systems was reviewed.  PHYSICAL EXAMINATION:  NEUROLOGIC:  5/5 bilateral lower extremity strength.  Sensory reveals decreased light touch on the posterior left thigh and posterolateral left calf and lateral foot.  Muscle stretch reflexes are 2+/4 bilateral patellar and medial hamstrings, 2+/4 right Achilles, 0/4 left Achilles.  Straight leg raise is positive on the left.  IMPRESSION:  Degenerative disk disease of the lumbar spine, status post L4-5 and L5 diskectomies with L5-S1 fusion.  Persistent right lower extremity S1 radicular pain.  PLAN: 1. Left S1 transforaminal epidural steroid injection.  Procedure was    described to the patient and informed consent was obtained.  The patient    was brought back to the fluoroscopy suite and placed on the table in prone    position.  Vital signs  were stable.  Skin was prepped in usual sterile    fashion.  Skin and subcutaneous tissue was anesthetized with 3 cc of    preservative-free 1% lidocaine.  Under direct fluoroscopic guidance, a    22-gauge 3-1/2 inch spinal needle was advanced into the left S1 neural    foramen.  Needle placement was confirmed in multiple planes.  Also, after    negative aspirate, 0.55 cc of Isovue-200 M was injected revealing a left S1    epiradiculogram.  This was then injected with 1 cc of preservative-free    Depo-Medrol 40 mg per cc plus 1 cc of preservative-free 1% lidocaine    without complication.  The patient tolerated the procedure well.  Discharge    instructions were given. 2. A prescription for Norco 7.5 mg/325 mg one p.o. q.6h. as needed for severe    pain. 3. The patient instructed to fill prescription of Elavil. 4. The patient instructed to make appointment for [facility]    psychology. 5. The patient to return to clinic in two weeks for reevaluation and possible    second injection.  The patient was also counseled on the use of narcotic    medications and that the goal of the injections was to decrease her    narcotic usage.  She understands.  The patient was educated on the above findings and recommendations and understands.  There were no barriers to communication. Dictated by:   Sondra Come, D.O. Attending Physician:  Andrey Campanile,  [place] DD:  11/06/00 TD:  11/06/00 Job: [id number]

## 2010-07-02 NOTE — Discharge Summary (Signed)
[name], [name]          ACCOUNT NO.:  [id number]   MEDICAL RECORD NO.:  [id number]          PATIENT TYPE:  INP   LOCATION:  9371                          [facility]ILITY[facility]  [facility]   PHYSICIAN:  [name], M.D.DATE OF BIRTH:  10/16/1980   DATE OF ADMISSION:  09/28/2004  DATE OF DISCHARGE:  10/01/2004                                 DISCHARGE SUMMARY   CHIEF COMPLAINT:  The patient is a 30 year old, Caucasian female who  presented to the [facility] Department after she felt that she  had a spider bite and had a fever.   HISTORY OF PRESENT ILLNESS:  Please see the above.   PAST MEDICAL HISTORY:  Asthma.   SOCIAL HISTORY:  She denies alcohol, recreational drug use.  She reports  history of tobacco use.   PAST OB HISTORY:  She is status post a recent delivery.   MEDICATIONS:  Prenatal vitamins.   PHYSICAL EXAMINATION:  VITAL SIGNS:  Blood pressure 130's-150's/90's, pulse  90-100, respirations 22-30, afebrile, oxygen saturation on room air 93%.  LUNGS:  Clear to auscultation.   LABORATORY DATA:  White blood cell count 8.4, hemoglobin 8.4, hematocrit  24.7.  Electrolytes normal.  Cardiac enzymes normal. B natriuretic peptide  elevated at 132.  A spiral CT scan of the upper chest failed to demonstrate  an acute pulmonary embolus.  However, there were diffuse interstitial and  patchy alveolar opacities suspicious for pulmonary edema with small  bilateral pleural effusions.   At this point parenteral antibiotics were initiated in the emergency  department and she was also given Lasix.   ASSESSMENT:  Postpartum, rule out severe preeclampsia with secondary  pulmonary edema and transient left ventricular dysfunction.   PLAN:  Transfer to [facility] and continue diuresis, magnesium  sulfate.   HOSPITAL COURSE:  Anesthesia was consulted secondary to the patient's back  pain and their exam and evaluation was unremarkable.  A 2-D echo was  performed and was  normal.  Cardiology was consulted.  An ACE inhibitor and a  beta blocker were initiated as tolerated.  She gradually improved.  The 2-D  echo was remarkable for septal hypokinesis with an ejection fraction of 50%  and at this point a nuclear stress test was recommended.  The nuclear stress  test was negative for ischemia.  She remained stable and was discharged to  home on August 18.   DISCHARGE DIAGNOSES:  Possible severe preeclampsia with secondary pulmonary  edema and transient left ventricular dysfunction versus cardiomyopathy.   CONDITION:  Stable.   DIET:  Low-salt, low-fat, fluid restrictions.   ACTIVITY:  Ad lib.  Walking was encouraged.   DISCHARGE MEDICATIONS:  Lasix, potassium chloride, beta blocker, ACE  inhibitor.   DISPOSITION:  The patient was to follow up with cardiology in two weeks.  The patient was to follow up at office in several days.      [name], M.D.  Electronically Signed     LAJ/MEDQ  D:  12/29/2004  T:  12/30/2004  Job:  [id number]

## 2010-07-02 NOTE — Op Note (Signed)
NAMELIORA, MYLES          ACCOUNT NO.:  [id number]   MEDICAL RECORD NO.:  [id number]          PATIENT TYPE:  INP   LOCATION:  9134                          [facility]ACILITY:  [facility]   PHYSICIAN:  Charles A. Clearance Coots, M.D.DATE OF BIRTH:  [birth year]   DATE OF PROCEDURE:  09/19/2004  DATE OF DISCHARGE:                                 OPERATIVE REPORT   PREOPERATIVE DIAGNOSES:  1.  Failed induction of labor after premature spontaneous rupture of      membranes.  2.  History of cone biopsy of the cervix x2 with Cicatrix of the cervix.   POSTOPERATIVE DIAGNOSES:  1.  Failed induction of labor after premature spontaneous rupture of      membranes.  2.  History of cone biopsy of the cervix x2 with Cicatrix of the cervix.  3.  Status post rupture of the Cicatrix of the cervix manually prior to      induction of labor   PROCEDURE:  Primary low transverse cesarean section.   SURGEON:  Charles A. Clearance Coots, M.D.   ASSISTANT:  Lysle Pearl, ORT   ANESTHESIA:  General after failed spinal.   ESTIMATED BLOOD LOSS:  800 mL.   IV FLUIDS:  2000 mL.   URINE OUTPUT:  400 mL clear.   COMPLICATIONS:  None.   FINDINGS:  Viable female at 11:52.  Apgars of 8 at one minute, 9 at five  minutes, weight of 6 pounds and 6 ounces.  Normal uterus, ovaries and  fallopian tubes.   DESCRIPTION OF PROCEDURE:  The patient was brought to the operating room and  after satisfactory general endotracheal anesthesia, after failed attempt at  spinal anesthesia, the abdomen was prepped and draped in the usual sterile  fashion.  Pfannenstiel's skin incision was made with the scalpel that was  deepened down to the fascia with a scalpel.  The fascia was nicked in the  midline and the fascial incision was extended to left and to the right with  curved Mayo scissors. Superior and inferior fascial edges were taken off the  rectus muscle with sharp dissection. The rectus muscle was bluntly divided  in the midline.  The peritoneum  was entered digitally and was digitally  extended to the left and to the right. The bladder blade was positioned.  The vesicouterine fold and peritoneum above the reflection of the urinary  bladder was grasped with forceps and was incised and undermined with  Metzenbaum scissors.  The incision was extended to the left and to the right  with Metzenbaum scissors.  The bladder flap was bluntly developed and the  bladder blade repositioned and from the urinary bladder placed in a well out  of the operative field.  The uterus was entered transversely in the lower  uterine segment with a scalpel.  Clear amniotic fluid was expelled.  The  uterine incision was extended to the left and to the right with digital  traction.  The vertex was then delivered with the aid of fundal pressure  from the assistant. The delivery was completed with the aid of fundal  pressure from the assistant.  The infant's mouth  and nose were suctioned  with a suction bulb and the umbilical cord was doubly clamped and cut, and  the infant was handed off to the nursery staff.  Cord blood was obtained,  and the placenta was spontaneously expelled from the uterine cavity intact.  The endometrial surface was thoroughly debrided with a dry lap sponge.  The  edges of the uterine incision were grasped with ring forceps, and the uterus  was closed with continuous interlocking suture of 0 Monocryl.  Hemostasis  was excellent.  The pelvic cavity was thoroughly irrigated with warm saline  solution.  The abdomen was then closed as follows. The peritoneum was closed  with continuous suture of 2-0 Monocryl.  The fascia was closed with  continuous suture of 0 PDS from each corner to the center.  The subcutaneous  tissue was thoroughly irrigated with warm saline solution. All areas of  subcutaneous bleeding were coagulated with the Bovie.  The skin was then  closed with a continuous subcuticular suture of 3-0 Monocryl.  A sterile  bandage  was applied to the incision closure.  The surgical technician  indicated that all needle, sponge and instrument counts were correct.  The  patient tolerated procedure well and was transported to recovery in  satisfactory condition.      Charles A. Clearance Coots, M.D.  Electronically Signed     CAH/MEDQ  D:  09/19/2004  T:  09/19/2004  Job:  [id number]

## 2010-07-02 NOTE — Discharge Summary (Signed)
NAMENONNA, RENNINGER          ACCOUNT NO.:  [id number]   MEDICAL RECORD NO.:  [id number]          PATIENT TYPE:  INP   LOCATION:  9134                          [facility]   PHYSICIAN:  Charles A. Clearance Coots, M.D.DATE OF BIRTH:  1980/07/27   DATE OF ADMISSION:  09/18/2004  DATE OF DISCHARGE:  09/22/2004                                 DISCHARGE SUMMARY   ADMITTING DIAGNOSES:  1.  Thirty-nine weeks' gestation.  2.  Premature rupture of membranes.   DISCHARGE DIAGNOSES:  1.  Thirty-nine weeks' gestation.  2.  Premature rupture of membranes.  3.  Status post primary low transverse cesarean section on September 19, 2004,      for failed induction of labor after spontaneous rupture of membranes.      Delivered a viable female infant at 11:52.  Apgars of 8 at one minute and      9 at five minutes, weight of 6 pounds and 6 ounces.  Mother and infant      discharged home in good condition.   REASON FOR ADMISSION:  A 30 year old G2, P0, white female with estimated  date of confinement of September 25, 2004, presented with spontaneous rupture  of membranes, no uterine contractions.  The patient's prenatal care has been  uncomplicated.  The gyn history was significant for cervical dysplasia and  cone biopsy x 2 in 1999 and 2000.  The patient is also Rh negative.   PAST MEDICAL HISTORY:   SURGERY:  1.  Cone biopsy.  2.  Knee surgery.  3.  Emergency lumbar fusion x 2.   ILLNESSES:  1.  Asthma.  2.  Degenerative joint disease of back.   MEDICATIONS:  Prenatal vitamins, albuterol inhaler, Protonix.   ALLERGIES:  No known drug allergies.SULFA.   SOCIAL HISTORY:  Married, positive tobacco, negative alcohol or recreational  drugs.   PHYSICAL EXAMINATION:  GENERAL:  Obese white female in no acute distress.  VITAL SIGNS:  Temperature 97, pulse 107, blood pressure 117/73.  ABDOMEN:  Obese, gravid, nontender.  CERVIX:  Fingertip, 80% effaced, negative 2 station, vertex.   ADMITTING  LABORATORY VALUES:  Hemoglobin 12.4, hematocrit 36.8, white blood  cell count 13,100, platelets 268,000.   HOSPITAL COURSE:  The patient was admitted and started on IV Pitocin for  induction of labor.  Because of previous cone biopsies, there was a cicatrix  of the cervix that was popped with dressing forceps.  An IUPC was inserted  for management of labor.  The patient had [place] units of 190-200 after  insertion of IUPC and with adequate Pitocin, and she made absolutely no  significant progress in labor at all.  And after 24 hours of Pitocin and  adequate labor for greater than 8 hours, a decision was made to proceed with  cesarean section delivery for failed induction of labor after spontaneous  rupture of membranes.  The patient underwent a primary low transverse  cesarean section on September 19, 2004.  There were no intraoperative  complications.  Postoperative course was uncomplicated.  The patient was  discharged home on postop day #3 in good condition.  DISCHARGE LABORATORY VALUES:  Hemoglobin 8.9, hematocrit 26.4, white blood  cell count 10,400, platelets 215,000.   DISCHARGE DISPOSITION:   MEDICATIONS:  Tylox and ibuprofen was prescribed for pain.  Continue  prenatal vitamins.  Iron was prescribed.   Routine written instructions were given for obstetrical discharge after  cesarean section.  The patient is to call office for a follow up appointment  in 2 weeks.      Charles A. Clearance Coots, M.D.  Electronically Signed     CAH/MEDQ  D:  12/03/2004  T:  12/04/2004  Job:  [id number]

## 2010-07-02 NOTE — Consult Note (Signed)
[name], [name]                      ACCOUNT NO.:  [id number]   MEDICAL RECORD NO.:  [id number]                   PATIENT TYPE:   LOCATION:                                       FACILITY:   PHYSICIAN:  John T. Kyla Balzarine, M.D.                 DATE OF BIRTH:   DATE OF CONSULTATION:  DATE OF DISCHARGE:                                   CONSULTATION   HISTORY OF PRESENT ILLNESS:  This 30 year old, 0 gravida woman is referred  by Dr. Kristen Loader for evaluation of CIN.   PAST MEDICAL HISTORY:  The patient is a very poor historian and we only have  one sheet of history from Dr. Okey Dupre. She has a long standing history of  cervical dysplasia or carcinoma in situ treated with CKC times two, the last  in 2002. Apparently, there was a concern regarding the use of biopsy because  of a metal cage placed in her back following trauma. PAP smear in September  of 2003 was compatible with low grade SIL. PAP smear on February 19th  revealed high SIL, CIN 1:2. Additionally, the patient has a two week history  of lower abdominal pain. She had been on Depo Provera in many years and  stopped in December. She had the onset of menses on February 7th and  developed sharp pain at about the same time. She had evaluation in the  [facility] emergency room and was refereed again to Dr.  Okey Dupre. Ultrasound was performed in his office which apparently revealed a  very small, complex cyst with a plan for his to repeat that in approximately  4-6 weeks.   PAST MEDICAL HISTORY:  Frequent upper respiratory infections. Traumatic back  injury requiring stabilization with a spinal cage and requiring several re-  operations. Cold knife conization times two.   MEDICATIONS:  Currently none.   ALLERGIES:  No known drug allergies.   FAMILY HISTORY:  Mother and two sisters have undergone hysterectomies for  cervical cancer. On close questioning these diagnoses are very unlikely  and most likely, they  were treated for non-invasive cervical dysplasia.   SOCIAL HISTORY:  Currently single sexual partner. Denis sexually transmitted  disease or condyloma. She is a smoker. Denies Ethanol.   REVIEW OF SYSTEMS:  Otherwise negative.   PHYSICAL EXAMINATION:  VITAL SIGNS: Stable and afebrile. Weight 210 pounds.  GENERAL: Alert and oriented times three and in no acute distress.  HEENT: Clear.  LUNGS: Fields are clear.  HEART: Sounds are regular.  BACK: No cardiovascular tenderness.  ABDOMEN: Obese, soft and benign without ascites, tenderness, mass or  organomegaly.  EXTREMITIES: Full range of motion  without edema.  GU: Pelvic, external genitalia and BUS are normal to inspection and  palpation. The bladder and urethra are well supported and there are no gross  lesions. Cervix is somewhat flush with the upper vagina and scarred from  prior conization.  Bimanual and rectovaginal exams reveals mobile small  uterus without adnexal pathology.   PROCEDURE:  After verbal informed consent was obtained I performed  colposcopy of the upper vagina and cervix. There was marked scarring of the  cervix but there was attached white epithelium at approximately 4:00 to 5:00  o'clock with punctation and mosaicism. The biopsy was obtained.   ASSESSMENT:  Likely moderate dysplasia.   PAST FAMILY PSYCHIATRIC HISTORY:  We will treat the patient with vagina  Efudex for three months, using a TIW schedule. We will re-evaluate her a  month off of therapy, with the hope that we could treat less of her cervix.  We will also contact her neurosurgeon so that we can settle the issue as to  whether she can undergo electrosurgery of the cervix.                                               John T. Kyla Balzarine, M.D.    JTS/MEDQ  D:  05/01/2002  T:  05/02/2002  Job:  [id number]   cc:   Michele Mcalpine D. Okey Dupre, M.D.  [address]  [place]  [place] [address]  Fax: [phone]   Telford Nab, R.N.

## 2010-07-02 NOTE — Op Note (Signed)
[facility]  Patient:    [name], [name]                   MRN: [id number] Proc. Date: 02/28/00 Attending:  [name], M.D. CC:         [facility] Outpatient Clinic, [facility]   Operative Report  PREOPERATIVE DIAGNOSIS:       High-grade cervical dysplasia persistent, status post previous conization.  POSTOPERATIVE DIAGNOSIS:      High-grade cervical dysplasia persistent, status post previous conization.  PROCEDURE:                    Cold knife conization.  SURGEONS:                     [name], M.D.                               Charles A. Clearance Coots, M.D.  ANESTHESIA:                   Laryngeal mask airway.  COMPLICATIONS:                None.  ESTIMATED BLOOD LOSS:         Less than 50 cc.  FINDINGS:                     Upon application of the Lugol solution, there was a rather large Lugol negative area involving the exocervix circumferentially.  The single-tooth tenaculum was then applied to the anterior lip of the cervix.  Stay sutures were then placed at 3 and 9 oclock respectively.  The cervix was then infiltrated with a dilute epinephrine solution in four quadrants.  A cone biopsy was then performed; however, the specimen was fragmented.  An endocervical curettage was then performed. Gelfoam was then placed into the cone base.  U-stitch sutures were placed anteriorly and posteriorly for hemostasis.  Adequate hemostasis was noted. All the instruments were then removed from the vagina.  The patient tolerated the procedure well.  The patient was taken to the PACU in stable condition. DD:  02/28/00 TD:  02/28/00 Job: [id number]

## 2010-07-02 NOTE — H&P (Signed)
[facility]  Patient:    [name], [name]                        MRN: [id number] Attending:  [name], M.D.            History and Physical  CHIEF COMPLAINT:              The patient is a 30 year old para 0 with persistent highgrade dysplasia status post conization procedure.  HISTORY OF PRESENT ILLNESS:   The patient is status post a cold knife conization in March2001.  The pathology from the cone specimen demonstrated CIN III with endocervical margin involvement and ectocervical margin involvement.  Therewas endocervical gland involvement by dysplasia as well. Subsequent Papsmear in November demonstrated CIN II.  A colposcopic examination performed today is consistent with multifocal high grade dysplasia.  The examination today was not satisfactory and a biopsy and endocervical curettingsare pending.  ALLERGIES:                    CODEINE GI upset, questionable allergy.  MEDICATIONS:                  Valium, Demerol, Phenergan.  PAST SURGICAL HISTORY:        She is status post two back surgeries with a metal plate in situ and tonsil and adenoids.  PAST MEDICAL HISTORY:         Remarkable for asthma.  PAST OBSTETRICAL HISTORY:     As above.  PAST GYNECOLOGICAL HISTORY:   As above.  FAMILY HISTORY:               Remarkable for cervical cancer.  PHYSICAL EXAMINATION:  VITAL SIGNS:                  Pulse 93, blood pressure 121/74, weight 198 pounds.  LUNGS:                        Clear toauscultation bilaterally.  HEART:                        Regular rateand rhythm.  ABDOMEN:                      Soft, nontender.  No organomegaly.  PELVIC:                       Colposcopic examination was performed today. See the above.  EXTREMITIES:                  No cyanosis, clubbing, or edema.  ASSESSMENT:                   Persistent high grade dysplasia status post conization.  PLAN:            Will check the biopsy results and  endocervical curettings from today.  Will proceed with a cold knife conization. DD:  02/01/00 TD:  02/01/00 Job: [id number]

## 2010-07-02 NOTE — Op Note (Signed)
[facility]  Patient:    [name], [name]                      MRN: [id number] Proc. Date: 05/03/99 Adm. Date:  [id number] Disc. Date: [id number] Attending:  Antionette Char CC:         GYN Outpatient clinic                           Operative Report  PREOPERATIVE DIAGNOSIS:       High grade cervical dysplasia.  POSTOPERATIVE DIAGNOSIS:      High grade cervical dysplasia.  OPERATION:                    Cold knife conization.  SURGEON:                      [name], M.D.  ASSISTANT:  ANESTHESIA:                   Laryngeal mask airway.  ESTIMATED BLOOD LOSS:         Less than 50 cc.  COMPLICATIONS:                None.  FINDINGS:                     Upon application of lugols stain, there was a large circumferential portion of the exocervix that was lugols negative.  DESCRIPTION OF PROCEDURE:     The patient was taken to the operating room where  general anesthesia was administered without difficulty.  She was placed in the dorsal lithotomy position and draped in the usual sterile fashion.  A weighted speculum was then placed in the vagina and a Sims retractor was placed anteriorly for retraction.  Lugols stain was then applied with the above findings noted.  single tooth tenaculum was applied to the anterior lip of the cervix.  Stay sutures were then placed at 3 and 9 oclock using 0 Vicryl figure-of-eight sutures.  The  single tooth tenaculum was then removed.  The cervix was then infiltrated with lidocaine with a dilute epinephrine solution, total of 8 cc was used.  The scalpel was then used to incise the cervix circumferentially and a cone specimen was then removed.  A 2-0 chromic suture was then used in a pursestring-type manner involving the cervical mucosa circumferentially to achieve hemostasis.  Monsels was applied to the cone base.  The patient tolerated the procedure well.  All sponge, needle, and instrument  counts were correct.  The patient was taken to the PACU in stable condition.  Pathology; cervical cone biopsy. DD:  05/05/99 TD:  05/06/99 Job: [id number]
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
## 2010-07-02 NOTE — Consult Note (Signed)
[facility]  Patient:    BROOKELIN, FELBER Visit Number: [id number] MRN: [id number]          Service Type: PMG Location: [facility] Attending Physician:  Sondra Come Proc. Date: 10/30/00 Admit Date:  10/27/2000   CC:         Cristi Loron, M.D.   Consultation Report  REFERRING PHYSICIAN:  Cristi Loron, M.D.  CHIEF COMPLAINT:  Back and left lower extremity pain.  HISTORY OF PRESENT ILLNESS:  Ms. Streb is a pleasant 30 year old right-hand dominant female who was kindly referred by Dr. Lovell Sheehan for pain management.  Patient states that she is status post L-5 and L5-S1 diskectomy in May 2000 by Dr. Lovell Sheehan.  Postoperatively she reinjured her back and underwent L5-S1 fusion in September 2000 for recurrent L5-S1 herniated disk. She continues to complain of back pain with pain and numbness radiating into her left posterior thigh, posterolateral leg, and lateral foot.  She has been treated primarily with medications and physical therapy, although she has not had therapy for greater than a year.  She continues to work, as she owns an [employer], but her pain is causing her to be depressed, causing her functional quality of life indices to decline, and she seems frustrated.  Her sleep is also poor.  Currently her pain is a 7/10 on a subjective scale and described as constant, throbbing, sharp, burning, stabbing, with associated numbness and weakness.  Her symptoms are made worse with walking, bending, and sitting, as well as working, and improved with heat and ice.  She is currently taking Lortab 7.5 mg two to three per day and has 10-15 left.  She says this gives her some mild relief.  She is also taking cyclobenzaprine 10 mg, which offers no relief, and so she discontinued this on her own.  She has tried Neurontin in the past, cannot remember the dose, and it does not offer any relief.  She takes [facility] Powder as needed, and that gives her  some mild relief. She denies bowel and bladder dysfunction.  She denies fever, chills, night sweats, or weight loss.  REVIEW OF SYSTEMS:  Health and history form, a 14-point review of systems was reviewed.  PAST MEDICAL HISTORY:  Cervical cancer and low back pain with L5-S1 and L4-5 disk herniations.  PAST SURGICAL HISTORY:  Lumbar spine surgery x 2 as described.  Cone biopsy x 2.  FAMILY HISTORY:  Heart disease, cancer, diabetes, high blood pressure.  SOCIAL HISTORY:  Smokes one-half pack of cigarettes per day, and I counseled her on the importance of smoking cessation in terms of her overall pain management.  Alcohol:  Occasionally.  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  She enjoys her job.  ALLERGIES:  ULTRAM.  MEDICATIONS:  Lortab 7.5 mg two to three per day, and BC Powder p.r.n.  PHYSICAL EXAMINATION:  VITAL SIGNS:  Blood pressure 122/72, pulse 94, respirations 16, O2 saturation 99%.  GENERAL:  A healthy female in no acute distress.  Mood and affect are depressed and flat.  Gait is normal.  MUSCULOSKELETAL:  Examination of the patients lumbar spine reveals mild scoliosis with a lower right hemipelvis.  There is increased lumbar lordosis. Palpatory examination reveals ropy, tight paraspinal muscles on the left with tenderness to palpation greater than the right.  Range of motion of the lumbar spine reveals limited flexion secondary to pain and pulling into the left lower extremity.  Extension is full without pain.  Rotation is full bilaterally without pain.  Rotation plus extension is full bilaterally with contralateral pain on the left.  Straight leg raise is positive on the left at 45 degrees, negative on the right, and Pearlean Brownie is negative bilaterally.  Manual muscle testing is 5/5 bilateral lower extremities.  Sensory exam reveals decreased light touch on the posterior left thigh, posterolateral left calf, and lateral left foot.  Muscle stretch  reflexes are 2+/4 bilateral patellar and medial hamstrings as well as right Achilles, 0/4 left Achilles.  Pedal pulses are present and equal bilaterally.  There is no heat, erythema, or edema in the lower extremities.  Skin is intact in the lower extremities.  DIAGNOSTIC STUDIES:  MRI report dated Jun 17, 1999, is an MRI of the lumbar spine which reveals postsurgical changes at L4-5 and L5-S1.  There is hemilaminectomy on the left at L4-5.  There is threaded interbody cage placement at L5-S1.  There is no unusual epidural fibrosis noted.  There is no recurrent herniated nucleus pulposus at L4-5.  There is a small ligamentous protrusion at L3-4 without nerve root encroachment.  IMPRESSION: 1. Degenerative spinal disease of the lumbar spine, status post L4-5 and L5-S1    diskectomy with subsequent L5-S1 fusion for disk herniation.  The patient    has persistent low back pain with pain in an S1 radicular pattern on the    left. 2. Nonrestorative sleep disorder. 3. Multiple psychosocial stressors, with depressed mood.  PLAN: 1. Will schedule the patient for a trial of left S1 selective nerve root    block/transforaminal epidural steroid injection.  The procedure was    described to patient, and she agrees to proceed with this. 2. Elavil 25 mg one p.o. q.h.s., #30 with one refill for sleep. 3. Behavioral Health psychology for stress management, coping strategies,    behavior modification. 4. Patient to return to clinic for injection.  Patient was educated on above findings and recommendations and understands. There were no barriers to communication.Attending Physician:  Sondra Come DD:  10/30/00 TD:  10/30/00 Job: [id number]

## 2010-07-08 ENCOUNTER — Other Ambulatory Visit: Payer: Self-pay | Admitting: Obstetrics & Gynecology

## 2010-07-08 DIAGNOSIS — Z803 Family history of malignant neoplasm of breast: Secondary | ICD-10-CM

## 2010-07-08 DIAGNOSIS — Z1231 Encounter for screening mammogram for malignant neoplasm of breast: Secondary | ICD-10-CM

## 2010-07-16 ENCOUNTER — Ambulatory Visit: Payer: Self-pay

## 2010-07-23 ENCOUNTER — Ambulatory Visit: Payer: Self-pay

## 2010-11-07 ENCOUNTER — Emergency Department (HOSPITAL_COMMUNITY): Payer: Medicare Other

## 2010-11-07 ENCOUNTER — Inpatient Hospital Stay (HOSPITAL_COMMUNITY)
Admission: EM | Admit: 2010-11-07 | Discharge: 2010-11-11 | DRG: 202 | Disposition: A | Discharge status: Home / Self Care | Payer: Medicare Other | Attending: Internal Medicine | Admitting: Internal Medicine

## 2010-11-07 DIAGNOSIS — D72829 Elevated white blood cell count, unspecified: Secondary | ICD-10-CM | POA: Diagnosis present

## 2010-11-07 DIAGNOSIS — Z79899 Other long term (current) drug therapy: Secondary | ICD-10-CM

## 2010-11-07 DIAGNOSIS — K219 Gastro-esophageal reflux disease without esophagitis: Secondary | ICD-10-CM | POA: Diagnosis present

## 2010-11-07 DIAGNOSIS — Z833 Family history of diabetes mellitus: Secondary | ICD-10-CM

## 2010-11-07 DIAGNOSIS — J9600 Acute respiratory failure, unspecified whether with hypoxia or hypercapnia: Secondary | ICD-10-CM | POA: Diagnosis present

## 2010-11-07 DIAGNOSIS — F17200 Nicotine dependence, unspecified, uncomplicated: Secondary | ICD-10-CM | POA: Diagnosis present

## 2010-11-07 DIAGNOSIS — R000 Tachycardia, unspecified: Secondary | ICD-10-CM | POA: Diagnosis present

## 2010-11-07 DIAGNOSIS — T380X5A Adverse effect of glucocorticoids and synthetic analogues, initial encounter: Secondary | ICD-10-CM | POA: Diagnosis present

## 2010-11-07 DIAGNOSIS — M5380 Other specified dorsopathies, site unspecified: Secondary | ICD-10-CM | POA: Diagnosis present

## 2010-11-07 DIAGNOSIS — G8929 Other chronic pain: Secondary | ICD-10-CM | POA: Diagnosis present

## 2010-11-07 DIAGNOSIS — J45901 Unspecified asthma with (acute) exacerbation: Principal | ICD-10-CM | POA: Diagnosis present

## 2010-11-07 DIAGNOSIS — G43909 Migraine, unspecified, not intractable, without status migrainosus: Secondary | ICD-10-CM | POA: Diagnosis present

## 2010-11-07 DIAGNOSIS — E876 Hypokalemia: Secondary | ICD-10-CM | POA: Diagnosis not present

## 2010-11-07 DIAGNOSIS — Z8541 Personal history of malignant neoplasm of cervix uteri: Secondary | ICD-10-CM

## 2010-11-07 LAB — CBC
Hemoglobin: 15 g/dL (ref 12.0–15.0)
Platelets: 273 10*3/uL (ref 150–400)
RBC: 4.56 MIL/uL (ref 3.87–5.11)

## 2010-11-07 LAB — BASIC METABOLIC PANEL
CO2: 25 mEq/L (ref 19–32)
GFR calc non Af Amer: >60 mL/min (ref >60)
Glucose, Bld: 90 mg/dL (ref 70–99)
Potassium: 3.5 mEq/L (ref 3.5–5.1)
Sodium: 136 mEq/L (ref 135–145)

## 2010-11-07 LAB — DIFFERENTIAL
Basophils Absolute: 0 10*3/uL (ref 0.0–0.1)
Lymphocytes Relative: 19 % (ref 12–46)
Monocytes Absolute: 1.1 10*3/uL — ABNORMAL HIGH (ref 0.1–1.0)
Neutro Abs: 6.6 10*3/uL (ref 1.7–7.7)

## 2010-11-08 LAB — PRO B NATRIURETIC PEPTIDE: Pro B Natriuretic peptide (BNP): 35.4 pg/mL (ref 0–125)

## 2010-11-08 LAB — COMPREHENSIVE METABOLIC PANEL
ALT: 30 U/L (ref 0–35)
AST: 19 U/L (ref 0–37)
CO2: 23 mEq/L (ref 19–32)
Calcium: 9.8 mg/dL (ref 8.4–10.5)
Chloride: 100 mEq/L (ref 96–112)
GFR calc Af Amer: >60 mL/min (ref >60)
GFR calc non Af Amer: >60 mL/min (ref >60)
Glucose, Bld: 184 mg/dL — ABNORMAL HIGH (ref 70–99)
Sodium: 135 mEq/L (ref 135–145)
Total Bilirubin: 0.2 mg/dL — ABNORMAL LOW (ref 0.3–1.2)

## 2010-11-08 LAB — CBC
Hemoglobin: 14.1 g/dL (ref 12.0–15.0)
MCH: 32.2 pg (ref 26.0–34.0)
MCV: 95.7 fL (ref 78.0–100.0)
RBC: 4.38 MIL/uL (ref 3.87–5.11)
WBC: 9.4 10*3/uL (ref 4.0–10.5)

## 2010-11-08 LAB — LIPID PANEL
LDL Cholesterol: 169 mg/dL — ABNORMAL HIGH (ref 0–99)
Triglycerides: 123 mg/dL (ref <150)
VLDL: 25 mg/dL (ref 0–40)

## 2010-11-08 LAB — HEMOGLOBIN A1C
Hgb A1c MFr Bld: 5.6 % (ref <5.7)
Mean Plasma Glucose: 114 mg/dL (ref <117)

## 2010-11-08 LAB — TSH: TSH: 0.377 u[IU]/mL (ref 0.350–4.500)

## 2010-11-09 DIAGNOSIS — I509 Heart failure, unspecified: Secondary | ICD-10-CM

## 2010-11-09 LAB — DIFFERENTIAL
Basophils Absolute: 0 10*3/uL (ref 0.0–0.1)
Basophils Relative: 0 % (ref 0–1)
Eosinophils Absolute: 0 10*3/uL (ref 0.0–0.7)
Eosinophils Relative: 0 % (ref 0–5)
Lymphocytes Relative: 7 % — ABNORMAL LOW (ref 12–46)
Monocytes Absolute: 0.5 10*3/uL (ref 0.1–1.0)

## 2010-11-09 LAB — COMPREHENSIVE METABOLIC PANEL
AST: 20 U/L (ref 0–37)
Albumin: 3.9 g/dL (ref 3.5–5.2)
Alkaline Phosphatase: 64 U/L (ref 39–117)
BUN: 12 mg/dL (ref 6–23)
Chloride: 98 mEq/L (ref 96–112)
Potassium: 3.4 mEq/L — ABNORMAL LOW (ref 3.5–5.1)
Total Bilirubin: 0.1 mg/dL — ABNORMAL LOW (ref 0.3–1.2)
Total Protein: 7.6 g/dL (ref 6.0–8.3)

## 2010-11-09 LAB — CBC
HCT: 41.7 % (ref 36.0–46.0)
MCHC: 34.3 g/dL (ref 30.0–36.0)
Platelets: 289 10*3/uL (ref 150–400)
RDW: 12.8 % (ref 11.5–15.5)
WBC: 15.5 10*3/uL — ABNORMAL HIGH (ref 4.0–10.5)

## 2010-11-09 LAB — MAGNESIUM: Magnesium: 2 mg/dL (ref 1.5–2.5)

## 2010-11-10 LAB — CBC
HCT: 41.2 % (ref 36.0–46.0)
MCH: 32.5 pg (ref 26.0–34.0)
MCV: 96.3 fL (ref 78.0–100.0)
RBC: 4.28 MIL/uL (ref 3.87–5.11)
WBC: 13.9 10*3/uL — ABNORMAL HIGH (ref 4.0–10.5)

## 2010-11-10 LAB — DIFFERENTIAL
Eosinophils Absolute: 0 10*3/uL (ref 0.0–0.7)
Eosinophils Relative: 0 % (ref 0–5)
Lymphocytes Relative: 10 % — ABNORMAL LOW (ref 12–46)
Lymphs Abs: 1.3 10*3/uL (ref 0.7–4.0)
Monocytes Relative: 4 % (ref 3–12)
Neutrophils Relative %: 87 % — ABNORMAL HIGH (ref 43–77)

## 2010-11-10 LAB — COMPREHENSIVE METABOLIC PANEL
BUN: 18 mg/dL (ref 6–23)
CO2: 28 mEq/L (ref 19–32)
Calcium: 9.7 mg/dL (ref 8.4–10.5)
Chloride: 98 mEq/L (ref 96–112)
Creatinine, Ser: 0.63 mg/dL (ref 0.50–1.10)
GFR calc Af Amer: >60 mL/min (ref >60)
GFR calc non Af Amer: >60 mL/min (ref >60)
Total Bilirubin: 0.2 mg/dL — ABNORMAL LOW (ref 0.3–1.2)

## 2010-11-10 LAB — MAGNESIUM: Magnesium: 2.1 mg/dL (ref 1.5–2.5)

## 2010-11-11 LAB — COMPREHENSIVE METABOLIC PANEL
ALT: 28
AST: 21
Alkaline Phosphatase: 45
CO2: 25
Calcium: 9.8
GFR calc Af Amer: >60
GFR calc non Af Amer: >60
Potassium: 5
Sodium: 139

## 2010-11-11 LAB — ABO/RH: ABO/RH(D): A NEG

## 2010-11-11 LAB — CBC
HCT: 24.1 — ABNORMAL LOW
HCT: 25.4 — ABNORMAL LOW
Hemoglobin: 8.2 — ABNORMAL LOW
MCHC: 34.3
MCV: 94.9
MCV: 95.3
MCV: 95.6
MCV: 95.9
Platelets: 258 10*3/uL (ref 150–400)
Platelets: 172
Platelets: 177
RBC: 4.2 MIL/uL (ref 3.87–5.11)
RBC: 2.48 — ABNORMAL LOW
RBC: 2.51 — ABNORMAL LOW
RBC: 4.6
RDW: 12.8 % (ref 11.5–15.5)
RDW: 13.1
WBC: 10.6 10*3/uL — ABNORMAL HIGH (ref 4.0–10.5)
WBC: 10.2
WBC: 11.7 — ABNORMAL HIGH
WBC: 7.9
WBC: 8.6
WBC: 9.4

## 2010-11-11 LAB — POCT I-STAT 7, (LYTES, BLD GAS, ICA,H+H)
Acid-base deficit: 3 — ABNORMAL HIGH
Calcium, Ion: 1.14
O2 Saturation: 100
Potassium: 4.1
Sodium: 138

## 2010-11-11 LAB — BASIC METABOLIC PANEL
BUN: 3 — ABNORMAL LOW
BUN: <1 — ABNORMAL LOW
CO2: 28
Calcium: 8 — ABNORMAL LOW
Chloride: 104
Chloride: 107
Chloride: 109
Creatinine, Ser: 0.64
Creatinine, Ser: 0.65
GFR calc Af Amer: >60
GFR calc non Af Amer: >60
Glucose, Bld: 119 — ABNORMAL HIGH
Potassium: 3.8

## 2010-11-11 LAB — URINALYSIS, ROUTINE W REFLEX MICROSCOPIC
Glucose, UA: NEGATIVE
Hgb urine dipstick: NEGATIVE
Specific Gravity, Urine: 1.023

## 2010-11-11 LAB — DIFFERENTIAL
Eosinophils Absolute: 0.3
Eosinophils Relative: 3
Lymphs Abs: 2.5
Monocytes Relative: 5

## 2010-11-11 LAB — URINE CULTURE

## 2010-11-11 LAB — MAGNESIUM: Magnesium: 2 mg/dL (ref 1.5–2.5)

## 2010-11-11 LAB — TYPE AND SCREEN: Antibody Screen: NEGATIVE

## 2010-11-11 LAB — PROTIME-INR: Prothrombin Time: 12.2

## 2010-11-23 NOTE — Discharge Summary (Signed)
[name], [name]          ACCOUNT NO.:  [id number]  MEDICAL RECORD NO.:  [id number]  LOCATION:  1338                         FACILITY:  [facility]  PHYSICIAN:  Marinda Elk, M.D.DATE OF BIRTH:  [birth year]  DATE OF ADMISSION:  11/07/2010 DATE OF DISCHARGE:  11/11/2010                              DISCHARGE SUMMARY   PRIMARY CARE PHYSICIAN:  [name], M.D.  DISCHARGE DIAGNOSES: 1. Acute hypoxic respiratory failure, secondary to asthma     exacerbation. 2. Hypokalemia.  DISCHARGE MEDICATIONS: 1. Advair 1 puff b.i.d. 2. Prednisone taper. 3. Albuterol inhaled 2 puffs q.4 h. p.r.n. 4. Hydrocortisone 2 tablets q.6 h. p.r.n. 5. Imitrex 50 mg by mouth as needed. 6. Multivitamin tablet daily. 7. Valium 5 mg 1-2 tablets q.6 h. P.r.n. 8. Fish oil 1 capsule daily.  PROCEDURES PERFORMED:  Chest x-ray that showed mild nonspecific accentuation of the interstitial marking.  This could represent pneumonitis.  BRIEF ADMITTING HISTORY AND PHYSICAL:  This is a 30 year old female with past medical history significant for asthma, has not been taking her medication for a year, came to the emergency room complaining of shortness of breath and wheezing.  She has had sick contacts.  Her husband and son who has had some kind of viral upper respiratory infection.  Please refer to dictation from November 08, 2010, for further details.  LABORATORY DATA ON ADMISSION:  Sodium 136, potassium 3.5, chloride 98, bicarbonate 25, glucose of 90, BUN of 5, creatinine 0.5, calcium 9.5. CBC demonstrated white blood cell count of 9.9, hemoglobin of 15, platelet count is 273.  IMAGING:  Chest x-ray showed as above.  BRIEF HOSPITAL COURSE: 1. Acute hypoxic respiratory failure secondary to asthma exacerbation.     She was admitted to the hospital, started on IV steroids and     inhalers.  Her saturations improved by the next day.  She was     started on IV antibiotic secondary to an  increase in her white     count.  This was stopped by the next day because she had no fever.     Her white count came down.  This is probably secondary to steroids.     She will be sent out on Advair, which she was supposed to be at     home and then de-escalated down to Solu-Medrol when she follows up     with her primary care doctor. 2. Hypokalemia that is probably secondary to the albuterol use.  This     was repleted. 3. Leukocytosis probably secondary to steroids.  She had no fever.     She had no infiltrate on chest x-ray.  She was feeling quite well,     so her antibiotics were stopped.  She was sent out on no     antibiotics.  Vitals on day of discharge shows a temperature of 97,     pulse 72, respirations 16, blood pressure 109/48, she was     saturating 100% on room air.  Labs on day of discharge shows a     magnesium was 2.0, white count of 10.6, hemoglobin of 13.5,     platelet count 258.     Marinda Elk,  M.D.     AF/MEDQ  D:  11/11/2010  T:  11/11/2010  Job:  [id number]  cc:   [name], M.D. Fax: [phone]  Electronically Signed by Marinda Elk M.D. on 11/23/2010 08:23:08 AM

## 2010-11-25 NOTE — H&P (Signed)
[name], LUZADER NO.:  [id number]  MEDICAL RECORD NO.:  [id number]  LOCATION:  WLED                         FACILITY[facility]  [facility]  PHYSICIAN:  Rosanna Randy, MDDATE OF BIRTH:  1980-10-21  DATE OF ADMISSION:  11/07/2010 DATE OF DISCHARGE:                             HISTORY & PHYSICAL   CHIEF COMPLAINT:  Shortness of breath, difficulty breathing.  HISTORY OF PRESENT ILLNESS:  The patient is a 30 year old female with a past medical history significant for asthma, currently not taking any medications for it who came into the emergency department complaining of significant shortness of breath and wheezing.  The patient reports that this difficulty breathing and shortness of breath that started yesterday night and have just slowly progressively worsened to the point that she is having now some wheezing and difficulty breathing even at rest.  The patient reports having some fever and also some whitish/clear productive cough since Thursday.  Regarding sick contacts, husband and son have been having some viral gastroenteritis/slash upper respiratory infection.  The patient denies any nausea or vomiting but poor having some decreased appetite and some stomach reflux discomfort going along with her other symptoms.  The patient came into the emergency department for further evaluation and treatment.  In the ED, a chest x-ray was unrevealing, but the patient was found to despite nebulizer treatment been with a oxygen saturation in the mid 80s on room air.  At that moment, [facility] hospitalist was called for further evaluation and treatment and to admit this patient for asthma exacerbation.  ALLERGIES:  No known drug allergy.  PAST MEDICAL HISTORY:  Significant for, 1. Asthma, currently not taking any medications previously using     Advair twice a day with p.r.n. albuterol. 2. Chronic back pain with a multiple back surgeries.  The patient is     using hydrocodone  7.5 mg 2 tablets every 8/6 hours as needed for     pain. 3. The patient have also a history of cervical cancer status post     surgical removal. 4. History of migraines on Imitrex for it and has also history of a     muscle spasm associated with her back pain for what she use up to     three times a day 5 mg of Valium.  MEDICATIONS:  Complete med rec has not been done at the moment of this dictation, but according to the patient she is using, 1. P.r.n. Valium. 2. P.r.n. hydrocodone 7.5 two tablets q.8 h/q.6 h. as needed and is     also on Imitrex 50 mg. 3. She also use Tylenol for pain as well.  SOCIAL HISTORY:  She lives in [place] with her husband and her son. She is on disability secondary to her chronic back pain.  She smokes about four to five cigarettes per day cutting down for a pack per day. Alcohol was denied and also illicit drugs.  FAMILY HISTORY:  Father with history of a heart problems, diabetes, lung cancer, and also renal disease currently on hemodialysis.  Her mother with a history of cervical cancer and also COPD.  REVIEW OF SYSTEMS:  Otherwise negative except as mentioned already on HPI.  PHYSICAL EXAM:  VITAL SIGNS:  Temperature 98.7, heart rate 98, respiratory rate 17, blood pressure 111/61, and oxygen saturation 96% on 2 liters. GENERAL:  The patient was lying in bed, currently in no acute distress, able to speak in full sentences. HEAD:  Normocephalic.  No trauma. EYES:  PERRLA.  Extraocular muscles intact.  No icterus and no nystagmus.  Moist mucous membranes.  No erythema.  No exudates.  No discharges out of her nostrils. NECK:  Supple.  No thyromegaly. RESPIRATORY:  Positive rhonchi and wheezing diffusely, especially on expiration with long expiratory phase.  There was a decrease in the air movement bilaterally especially at her bases. HEART:  Mild tachycardia.  No murmurs, no gallops, no rubs. ABDOMEN:  Soft, nontender, no guarding.  Positive  bowel sounds.  No distention. EXTREMITIES:  No edema, no cyanosis, no clubbing. SKIN:  No rash, no petechiae. NEUROLOGIC:  Alert, awake, and oriented x3.  Cranial nerves II through XII grossly intact.  No focal neurologic deficit was appreciated. Muscle strength 5/5 bilaterally symmetrically.  PERTINENT LABORATORY DATA:  A BMET have shown a sodium of 136, potassium 3.5, chloride 98, bicarb 25, glucose 90, BUN 5, creatinine 0.54, calcium 9.5.  A CBC demonstrating a white blood cells of 9.9, hemoglobin 15.0, platelets 273,000.  An chest x-ray demonstrated mild nonspecific accentuation of the interstitial markings.  ASSESSMENT AND PLAN.: 1. Asthma with hypoxia on room air, that improved significantly after     nebulizer treatment and one dose of Solu-Medrol in the ED.  At this     point, the patient is going to be admitted for further evaluation     and treatment to a regular bed.  We are going to continue IV Solu-     Medrol.  Since she is still actively wheezing, we will go ahead and     give 1 gram of magnesium and we are going to continue nebulizer     treatment with albuterol and Atrovent.  At this point, the patient     is going to need at least salmeterol and discharge as part of     maintenance treatment for her asthma and have a close follow up by     a pulmonologist as an outpatient.  The patient was advised to stop     smoking. 2. Chronic back pain.  We are going to continue her home medications     which consist of a hydrocodone 7.5 two tablets q.8/q.6 h on daily     basis. 3. Muscle spasm associated with her chronic back pain.  We are going     to continue the use of 5 mg Valium as needed for muscle spasm.. 4. Tobacco abuse.  We are going to provide a nicotine patch and we are     going to also to provide smoking cessation counseling. 5. First-degree relative with a history of diabetes.  At this point,     we are going to check a hemoglobin A1c and since the patient did      not have any primary care physician, we will go ahead and do some     risk stratification for her with a fasting lipid profile, TSH,     complete metabolic panel, and magnesium level. 6. Gastroesophageal reflux disease.  We will go ahead and start the     patient on Protonix 40 mg by mouth daily. 7. Migraines.  We will use p.r.n. Imitrex. 8. For DVT prophylaxis, we are going to use Lovenox.  Further     treatment and tests based on the patient's evolution throughout     this hospitalization.     Rosanna Randy, MD     CEM/MEDQ  D:  11/08/2010  T:  11/08/2010  Job:  [id number]  cc:   [name], M.D. Fax: [phone]  Electronically Signed by Vassie Loll MD on 11/25/2010 08:04:24 AM

## 2010-12-01 LAB — GC/CHLAMYDIA PROBE AMP, GENITAL
Chlamydia, DNA Probe: NEGATIVE
GC Probe Amp, Genital: NEGATIVE

## 2010-12-01 LAB — URINALYSIS, ROUTINE W REFLEX MICROSCOPIC
Glucose, UA: NEGATIVE
Hgb urine dipstick: NEGATIVE
Specific Gravity, Urine: >1.03 — ABNORMAL HIGH
pH: 5.5

## 2010-12-01 LAB — WET PREP, GENITAL
Clue Cells Wet Prep HPF POC: NONE SEEN
Trich, Wet Prep: NONE SEEN

## 2011-04-18 ENCOUNTER — Other Ambulatory Visit: Payer: Self-pay | Admitting: Orthopedic Surgery

## 2011-04-18 DIAGNOSIS — M549 Dorsalgia, unspecified: Secondary | ICD-10-CM

## 2011-04-20 ENCOUNTER — Other Ambulatory Visit: Payer: Medicare Other

## 2011-09-29 ENCOUNTER — Other Ambulatory Visit (HOSPITAL_COMMUNITY): Payer: Self-pay | Admitting: Orthopedic Surgery

## 2011-09-29 DIAGNOSIS — M545 Low back pain: Secondary | ICD-10-CM

## 2011-09-30 ENCOUNTER — Inpatient Hospital Stay (HOSPITAL_COMMUNITY): Admission: RE | Admit: 2011-09-30 | Payer: Medicare Other | Source: Ambulatory Visit

## 2011-09-30 ENCOUNTER — Encounter (HOSPITAL_COMMUNITY): Payer: Self-pay | Admitting: Pharmacy Technician

## 2011-10-03 ENCOUNTER — Other Ambulatory Visit: Payer: Self-pay | Admitting: Orthopedic Surgery

## 2011-10-03 ENCOUNTER — Encounter (HOSPITAL_COMMUNITY): Payer: Self-pay | Admitting: *Deleted

## 2011-10-03 MED ORDER — CHLORHEXIDINE GLUCONATE 4 % EX LIQD: 60.0000 mL | Freq: Once | CUTANEOUS | Status: DC

## 2011-10-03 MED ORDER — SODIUM CHLORIDE 0.9 % IV SOLN: 75.0000 mL/h | INTRAVENOUS | Status: DC

## 2011-10-03 MED ORDER — DEXTROSE 5 % IV SOLN
3.0000 g | INTRAVENOUS | Status: AC
  Administered 2011-10-04 (×2): 3 g via INTRAVENOUS
  Filled 2011-10-03: qty 3000

## 2011-10-03 NOTE — Progress Notes (Signed)
Pt said that she was admitted to the hospital in Sept of 2012 with resp infection.  Pt has history of Asthma uses Advair and Albuterol inhalers.  Pt does not have a medical Dr.  Annabell Howells gyn- Dr Tamela Oddi writes prescripitions for Inhalers. Pt denies any chest pain or shortness of breath at this time. Pt was seen in 2009 for chest pain and had a 2D echo, pt states that cardiologist said that she only needs to see them prn.  Dr Stefano Gaul notified of the above information, no new orders given.

## 2011-10-04 ENCOUNTER — Inpatient Hospital Stay (HOSPITAL_COMMUNITY): Payer: Medicare Other

## 2011-10-04 ENCOUNTER — Encounter (HOSPITAL_COMMUNITY): Payer: Self-pay | Admitting: Certified Registered"

## 2011-10-04 ENCOUNTER — Ambulatory Visit (HOSPITAL_COMMUNITY)
Admission: RE | Admit: 2011-10-04 | Discharge: 2011-10-04 | Disposition: A | Discharge status: Home / Self Care | Payer: Medicare Other | Source: Ambulatory Visit | Attending: Orthopedic Surgery | Admitting: Orthopedic Surgery

## 2011-10-04 ENCOUNTER — Inpatient Hospital Stay (HOSPITAL_COMMUNITY): Payer: Medicare Other | Admitting: Certified Registered"

## 2011-10-04 ENCOUNTER — Inpatient Hospital Stay (HOSPITAL_COMMUNITY)
Admission: RE | Admit: 2011-10-04 | Discharge: 2011-10-08 | DRG: 460 | Disposition: A | Discharge status: Home / Self Care | Payer: Medicare Other | Source: Ambulatory Visit | Attending: Orthopedic Surgery | Admitting: Orthopedic Surgery

## 2011-10-04 ENCOUNTER — Encounter (HOSPITAL_COMMUNITY): Payer: Self-pay | Admitting: *Deleted

## 2011-10-04 ENCOUNTER — Encounter (HOSPITAL_COMMUNITY)
Admission: RE | Disposition: A | Discharge status: Home / Self Care | Payer: Self-pay | Source: Ambulatory Visit | Attending: Orthopedic Surgery

## 2011-10-04 DIAGNOSIS — G8929 Other chronic pain: Secondary | ICD-10-CM | POA: Diagnosis present

## 2011-10-04 DIAGNOSIS — M549 Dorsalgia, unspecified: Secondary | ICD-10-CM

## 2011-10-04 DIAGNOSIS — T84498A Other mechanical complication of other internal orthopedic devices, implants and grafts, initial encounter: Principal | ICD-10-CM | POA: Diagnosis present

## 2011-10-04 DIAGNOSIS — I509 Heart failure, unspecified: Secondary | ICD-10-CM | POA: Diagnosis present

## 2011-10-04 DIAGNOSIS — J45901 Unspecified asthma with (acute) exacerbation: Secondary | ICD-10-CM | POA: Diagnosis present

## 2011-10-04 DIAGNOSIS — Z981 Arthrodesis status: Secondary | ICD-10-CM

## 2011-10-04 DIAGNOSIS — D72829 Elevated white blood cell count, unspecified: Secondary | ICD-10-CM | POA: Diagnosis not present

## 2011-10-04 DIAGNOSIS — Z91199 Patient's noncompliance with other medical treatment and regimen due to unspecified reason: Secondary | ICD-10-CM

## 2011-10-04 DIAGNOSIS — Y832 Surgical operation with anastomosis, bypass or graft as the cause of abnormal reaction of the patient, or of later complication, without mention of misadventure at the time of the procedure: Secondary | ICD-10-CM | POA: Diagnosis present

## 2011-10-04 DIAGNOSIS — J441 Chronic obstructive pulmonary disease with (acute) exacerbation: Secondary | ICD-10-CM | POA: Diagnosis not present

## 2011-10-04 DIAGNOSIS — F17200 Nicotine dependence, unspecified, uncomplicated: Secondary | ICD-10-CM | POA: Diagnosis present

## 2011-10-04 DIAGNOSIS — Z9119 Patient's noncompliance with other medical treatment and regimen: Secondary | ICD-10-CM

## 2011-10-04 DIAGNOSIS — M545 Low back pain: Secondary | ICD-10-CM

## 2011-10-04 DIAGNOSIS — M5440 Lumbago with sciatica, unspecified side: Secondary | ICD-10-CM | POA: Diagnosis present

## 2011-10-04 HISTORY — DX: Adverse effect of unspecified anesthetic, initial encounter: T41.45XA

## 2011-10-04 HISTORY — DX: Unspecified asthma, uncomplicated: J45.909

## 2011-10-04 HISTORY — DX: Nausea with vomiting, unspecified: R11.2

## 2011-10-04 HISTORY — DX: Reserved for concepts with insufficient information to code with codable children: IMO0002

## 2011-10-04 HISTORY — PX: SPINAL FUSION: SHX223

## 2011-10-04 HISTORY — DX: Family history of other specified conditions: Z84.89

## 2011-10-04 HISTORY — DX: Other specified postprocedural states: Z98.890

## 2011-10-04 HISTORY — DX: Heart failure, unspecified: I50.9

## 2011-10-04 HISTORY — DX: Other complications of anesthesia, initial encounter: T88.59XA

## 2011-10-04 LAB — CBC
HCT: 41 % (ref 36.0–46.0)
Hemoglobin: 14 g/dL (ref 12.0–15.0)
MCH: 32 pg (ref 26.0–34.0)
MCV: 93.8 fL (ref 78.0–100.0)
Platelets: 244 10*3/uL (ref 150–400)
RBC: 4.37 MIL/uL (ref 3.87–5.11)
WBC: 8.5 10*3/uL (ref 4.0–10.5)

## 2011-10-04 LAB — URINALYSIS, ROUTINE W REFLEX MICROSCOPIC
Hgb urine dipstick: NEGATIVE
Nitrite: NEGATIVE
Protein, ur: NEGATIVE mg/dL
Specific Gravity, Urine: 1.038 — ABNORMAL HIGH (ref 1.005–1.030)
Urobilinogen, UA: 0.2 mg/dL (ref 0.0–1.0)

## 2011-10-04 LAB — APTT: aPTT: 25 seconds (ref 24–37)

## 2011-10-04 LAB — COMPREHENSIVE METABOLIC PANEL
AST: 17 U/L (ref 0–37)
Albumin: 3.8 g/dL (ref 3.5–5.2)
BUN: 9 mg/dL (ref 6–23)
CO2: 25 mEq/L (ref 19–32)
Calcium: 9.5 mg/dL (ref 8.4–10.5)
Creatinine, Ser: 0.65 mg/dL (ref 0.50–1.10)
GFR calc non Af Amer: >90 mL/min (ref >90)

## 2011-10-04 LAB — TYPE AND SCREEN: Antibody Screen: NEGATIVE

## 2011-10-04 LAB — PROTIME-INR
INR: 1.01 (ref 0.00–1.49)
Prothrombin Time: 13.5 seconds (ref 11.6–15.2)

## 2011-10-04 LAB — SURGICAL PCR SCREEN
MRSA, PCR: NEGATIVE
Staphylococcus aureus: NEGATIVE

## 2011-10-04 SURGERY — FUSION, SPINE, 2 OR MORE LEVELS, POSTERIOR APPROACH
Anesthesia: General | Site: Spine Lumbar | Laterality: Bilateral | Wound class: Clean

## 2011-10-04 MED ORDER — NALOXONE HCL 0.4 MG/ML IJ SOLN: 0.4000 mg | INTRAMUSCULAR | Status: DC | PRN

## 2011-10-04 MED ORDER — BISACODYL 10 MG RE SUPP
10.0000 mg | Freq: Every day | RECTAL | Status: DC | PRN
  Administered 2011-10-08: 10 mg via RECTAL
  Filled 2011-10-04: qty 1

## 2011-10-04 MED ORDER — SODIUM CHLORIDE 0.9 % IJ SOLN: 3.0000 mL | INTRAMUSCULAR | Status: DC | PRN

## 2011-10-04 MED ORDER — PROMETHAZINE HCL 25 MG/ML IJ SOLN: 6.2500 mg | INTRAMUSCULAR | Status: DC | PRN

## 2011-10-04 MED ORDER — HYDROMORPHONE HCL PF 1 MG/ML IJ SOLN
INTRAMUSCULAR | Status: AC
  Filled 2011-10-04: qty 2

## 2011-10-04 MED ORDER — SODIUM CHLORIDE 0.9 % IV SOLN
INTRAVENOUS | Status: DC | PRN
  Administered 2011-10-04: 13:00:00 via INTRAVENOUS

## 2011-10-04 MED ORDER — PROMETHAZINE HCL 25 MG/ML IJ SOLN: 25.0000 mg | Freq: Four times a day (QID) | INTRAMUSCULAR | Status: DC | PRN

## 2011-10-04 MED ORDER — SUFENTANIL CITRATE 50 MCG/ML IV SOLN
INTRAVENOUS | Status: DC | PRN
Start: 2011-10-04 — End: 2011-10-04
  Administered 2011-10-04 (×2): 10 ug via INTRAVENOUS
  Administered 2011-10-04: 5 ug via INTRAVENOUS
  Administered 2011-10-04 (×3): 10 ug via INTRAVENOUS
  Administered 2011-10-04: 5 ug via INTRAVENOUS
  Administered 2011-10-04: 10 ug via INTRAVENOUS
  Administered 2011-10-04: 20 ug via INTRAVENOUS
  Administered 2011-10-04: 10 ug via INTRAVENOUS

## 2011-10-04 MED ORDER — SENNA 8.6 MG PO TABS
1.0000 | ORAL_TABLET | Freq: Two times a day (BID) | ORAL | Status: DC
  Administered 2011-10-04 – 2011-10-08 (×8): 8.6 mg via ORAL
  Filled 2011-10-04 (×9): qty 1

## 2011-10-04 MED ORDER — METHYLPREDNISOLONE SODIUM SUCC 125 MG IJ SOLR
60.0000 mg | Freq: Four times a day (QID) | INTRAMUSCULAR | Status: DC
  Administered 2011-10-04 – 2011-10-06 (×7): 60 mg via INTRAVENOUS
  Filled 2011-10-04 (×4): qty 0.96
  Filled 2011-10-04: qty 2
  Filled 2011-10-04 (×6): qty 0.96
  Filled 2011-10-04 (×2): qty 2

## 2011-10-04 MED ORDER — HEPARIN SODIUM (PORCINE) 1000 UNIT/ML IJ SOLN
INTRAMUSCULAR | Status: AC
  Filled 2011-10-04: qty 1

## 2011-10-04 MED ORDER — KETAMINE HCL 10 MG/ML IJ SOLN
INTRAMUSCULAR | Status: DC | PRN
  Administered 2011-10-04 (×17): 5 mg via INTRAVENOUS
  Administered 2011-10-04: 1 mg via INTRAVENOUS
  Administered 2011-10-04: 4 mg via INTRAVENOUS
  Administered 2011-10-04 (×4): 5 mg via INTRAVENOUS

## 2011-10-04 MED ORDER — LEVALBUTEROL HCL 0.63 MG/3ML IN NEBU
0.6300 mg | INHALATION_SOLUTION | RESPIRATORY_TRACT | Status: DC | PRN
  Administered 2011-10-07 – 2011-10-08 (×2): 0.63 mg via RESPIRATORY_TRACT
  Filled 2011-10-04 (×2): qty 3

## 2011-10-04 MED ORDER — MUPIROCIN 2 % EX OINT: TOPICAL_OINTMENT | Freq: Two times a day (BID) | CUTANEOUS | Status: DC

## 2011-10-04 MED ORDER — METHYLPREDNISOLONE SODIUM SUCC 125 MG IJ SOLR: 60.0000 mg | Freq: Four times a day (QID) | INTRAMUSCULAR | Status: DC

## 2011-10-04 MED ORDER — GLYCOPYRROLATE 0.2 MG/ML IJ SOLN
INTRAMUSCULAR | Status: DC | PRN
  Administered 2011-10-04: 0.4 mg via INTRAVENOUS

## 2011-10-04 MED ORDER — ONDANSETRON HCL 4 MG/2ML IJ SOLN
4.0000 mg | INTRAMUSCULAR | Status: DC | PRN
Start: 2011-10-04 — End: 2011-10-08
  Administered 2011-10-04: 4 mg via INTRAVENOUS

## 2011-10-04 MED ORDER — LIDOCAINE-EPINEPHRINE 1 %-1:100000 IJ SOLN
INTRAMUSCULAR | Status: AC
  Filled 2011-10-04: qty 1

## 2011-10-04 MED ORDER — MUPIROCIN 2 % EX OINT
TOPICAL_OINTMENT | CUTANEOUS | Status: AC
  Administered 2011-10-04: 1 via NASAL
  Filled 2011-10-04: qty 22

## 2011-10-04 MED ORDER — PROPOFOL 10 MG/ML IV EMUL
INTRAVENOUS | Status: DC | PRN
  Administered 2011-10-04 (×2): 20 mg via INTRAVENOUS
  Administered 2011-10-04: 200 mg via INTRAVENOUS

## 2011-10-04 MED ORDER — 0.9 % SODIUM CHLORIDE (POUR BTL) OPTIME
TOPICAL | Status: DC | PRN
  Administered 2011-10-04: 1000 mL

## 2011-10-04 MED ORDER — FLEET ENEMA 7-19 GM/118ML RE ENEM: 1.0000 | ENEMA | Freq: Once | RECTAL | Status: AC | PRN

## 2011-10-04 MED ORDER — FENTANYL CITRATE 0.05 MG/ML IJ SOLN
INTRAMUSCULAR | Status: DC | PRN
  Administered 2011-10-04: 25 ug via INTRAVENOUS

## 2011-10-04 MED ORDER — ONDANSETRON HCL 4 MG/2ML IJ SOLN
4.0000 mg | Freq: Four times a day (QID) | INTRAMUSCULAR | Status: DC | PRN
  Filled 2011-10-04: qty 2

## 2011-10-04 MED ORDER — VANCOMYCIN HCL 1000 MG IV SOLR
INTRAVENOUS | Status: AC
  Filled 2011-10-04: qty 1000

## 2011-10-04 MED ORDER — SCOPOLAMINE 1 MG/3DAYS TD PT72
MEDICATED_PATCH | TRANSDERMAL | Status: DC | PRN
  Administered 2011-10-04: 1 via TRANSDERMAL

## 2011-10-04 MED ORDER — LEVALBUTEROL HCL 0.63 MG/3ML IN NEBU
0.6300 mg | INHALATION_SOLUTION | Freq: Four times a day (QID) | RESPIRATORY_TRACT | Status: DC
  Administered 2011-10-04 – 2011-10-07 (×10): 0.63 mg via RESPIRATORY_TRACT
  Filled 2011-10-04 (×15): qty 3

## 2011-10-04 MED ORDER — ALBUTEROL SULFATE (5 MG/ML) 0.5% IN NEBU: 2.5000 mg | INHALATION_SOLUTION | RESPIRATORY_TRACT | Status: DC | PRN

## 2011-10-04 MED ORDER — ZOLPIDEM TARTRATE 5 MG PO TABS: 5.0000 mg | ORAL_TABLET | Freq: Every evening | ORAL | Status: DC | PRN

## 2011-10-04 MED ORDER — SODIUM CHLORIDE 0.9 % IR SOLN
Status: DC | PRN
  Administered 2011-10-04: 11:00:00

## 2011-10-04 MED ORDER — LIDOCAINE-EPINEPHRINE (PF) 1 %-1:200000 IJ SOLN
INTRAMUSCULAR | Status: DC | PRN
  Administered 2011-10-04: 17 mL

## 2011-10-04 MED ORDER — NEOSTIGMINE METHYLSULFATE 1 MG/ML IJ SOLN
INTRAMUSCULAR | Status: DC | PRN
  Administered 2011-10-04: 3 mg via INTRAVENOUS

## 2011-10-04 MED ORDER — ALBUTEROL SULFATE (5 MG/ML) 0.5% IN NEBU: 2.5000 mg | INHALATION_SOLUTION | Freq: Four times a day (QID) | RESPIRATORY_TRACT | Status: DC

## 2011-10-04 MED ORDER — ROCURONIUM BROMIDE 100 MG/10ML IV SOLN
INTRAVENOUS | Status: DC | PRN
  Administered 2011-10-04 (×5): 5 mg via INTRAVENOUS
  Administered 2011-10-04: 30 mg via INTRAVENOUS
  Administered 2011-10-04: 10 mg via INTRAVENOUS
  Administered 2011-10-04: 5 mg via INTRAVENOUS

## 2011-10-04 MED ORDER — MIDAZOLAM HCL 5 MG/5ML IJ SOLN
INTRAMUSCULAR | Status: DC | PRN
  Administered 2011-10-04 (×2): 2 mg via INTRAVENOUS

## 2011-10-04 MED ORDER — HYDROCODONE-ACETAMINOPHEN 10-325 MG PO TABS
1.0000 | ORAL_TABLET | ORAL | Status: DC | PRN
  Administered 2011-10-04 – 2011-10-08 (×13): 2 via ORAL
  Filled 2011-10-04 (×2): qty 2
  Filled 2011-10-04: qty 1
  Filled 2011-10-04 (×11): qty 2

## 2011-10-04 MED ORDER — BUPIVACAINE LIPOSOME 1.3 % IJ SUSP
20.0000 mL | Freq: Once | INTRAMUSCULAR | Status: AC
  Administered 2011-10-04: 20 mL
  Filled 2011-10-04: qty 20

## 2011-10-04 MED ORDER — SUMATRIPTAN SUCCINATE 25 MG PO TABS
25.0000 mg | ORAL_TABLET | ORAL | Status: DC | PRN
  Filled 2011-10-04: qty 1

## 2011-10-04 MED ORDER — SUCCINYLCHOLINE CHLORIDE 20 MG/ML IJ SOLN
INTRAMUSCULAR | Status: DC | PRN
  Administered 2011-10-04: 120 mg via INTRAVENOUS

## 2011-10-04 MED ORDER — FLUTICASONE-SALMETEROL 500-50 MCG/DOSE IN AEPB
1.0000 | INHALATION_SPRAY | Freq: Two times a day (BID) | RESPIRATORY_TRACT | Status: DC
  Administered 2011-10-08: 1 via RESPIRATORY_TRACT
  Filled 2011-10-04: qty 14

## 2011-10-04 MED ORDER — VANCOMYCIN HCL 1000 MG IV SOLR
INTRAVENOUS | Status: DC | PRN
  Administered 2011-10-04: 1000 mg

## 2011-10-04 MED ORDER — BUPIVACAINE HCL (PF) 0.25 % IJ SOLN
INTRAMUSCULAR | Status: AC
  Filled 2011-10-04: qty 30

## 2011-10-04 MED ORDER — HYDROMORPHONE 0.3 MG/ML IV SOLN
INTRAVENOUS | Status: DC
  Administered 2011-10-04: 15:00:00 via INTRAVENOUS
  Administered 2011-10-04: 4.8 mg via INTRAVENOUS
  Administered 2011-10-05 (×2): via INTRAVENOUS
  Administered 2011-10-05: 3.92 mg via INTRAVENOUS
  Administered 2011-10-05: 20:00:00 via INTRAVENOUS
  Administered 2011-10-05: 3.6 mg via INTRAVENOUS
  Administered 2011-10-05: 4.8 mg via INTRAVENOUS
  Administered 2011-10-05: 3.5 mg via INTRAVENOUS
  Administered 2011-10-05: 4.5 mg via INTRAVENOUS
  Administered 2011-10-06: 3.9 mg via INTRAVENOUS
  Administered 2011-10-06: 2.1 mg via INTRAVENOUS
  Administered 2011-10-06: 3.6 mg via INTRAVENOUS
  Administered 2011-10-06: 4.2 mg via INTRAVENOUS
  Administered 2011-10-06: 02:00:00 via INTRAVENOUS
  Administered 2011-10-06: 7.39 mg via INTRAVENOUS
  Administered 2011-10-06: 4.2 mg via INTRAVENOUS
  Administered 2011-10-07: 1.8 mg via INTRAVENOUS
  Administered 2011-10-07: 3.3 mg via INTRAVENOUS
  Administered 2011-10-07: 2.1 mg via INTRAVENOUS
  Administered 2011-10-07: 03:00:00 via INTRAVENOUS
  Administered 2011-10-07: 2.7 mg via INTRAVENOUS
  Administered 2011-10-07: 4.5 mg via INTRAVENOUS
  Administered 2011-10-07: 5 mg via INTRAVENOUS
  Administered 2011-10-08: 03:00:00 via INTRAVENOUS
  Administered 2011-10-08: 4.5 mg via INTRAVENOUS
  Administered 2011-10-08: 1.5 mg via INTRAVENOUS
  Filled 2011-10-04 (×11): qty 25

## 2011-10-04 MED ORDER — PANTOPRAZOLE SODIUM 40 MG IV SOLR
40.0000 mg | Freq: Every day | INTRAVENOUS | Status: DC
  Administered 2011-10-04: 40 mg via INTRAVENOUS
  Filled 2011-10-04 (×2): qty 40

## 2011-10-04 MED ORDER — DIPHENHYDRAMINE HCL 50 MG/ML IJ SOLN: 12.5000 mg | Freq: Four times a day (QID) | INTRAMUSCULAR | Status: DC | PRN

## 2011-10-04 MED ORDER — SODIUM CHLORIDE 0.9 % IV SOLN: 250.0000 mL | INTRAVENOUS | Status: DC

## 2011-10-04 MED ORDER — THROMBIN 20000 UNITS EX SOLR
OROMUCOSAL | Status: DC | PRN
  Administered 2011-10-04: 11:00:00 via TOPICAL

## 2011-10-04 MED ORDER — SODIUM CHLORIDE 0.9 % IJ SOLN: 9.0000 mL | INTRAMUSCULAR | Status: DC | PRN

## 2011-10-04 MED ORDER — SODIUM CHLORIDE 0.9 % IJ SOLN
3.0000 mL | Freq: Two times a day (BID) | INTRAMUSCULAR | Status: DC
  Administered 2011-10-05 – 2011-10-06 (×3): 3 mL via INTRAVENOUS

## 2011-10-04 MED ORDER — HYDROMORPHONE 0.3 MG/ML IV SOLN
INTRAVENOUS | Status: AC
  Administered 2011-10-04: 22:00:00
  Filled 2011-10-04: qty 25

## 2011-10-04 MED ORDER — DOCUSATE SODIUM 100 MG PO CAPS
100.0000 mg | ORAL_CAPSULE | Freq: Two times a day (BID) | ORAL | Status: DC
  Administered 2011-10-04 – 2011-10-08 (×8): 100 mg via ORAL
  Filled 2011-10-04 (×10): qty 1

## 2011-10-04 MED ORDER — DIPHENHYDRAMINE HCL 12.5 MG/5ML PO ELIX: 12.5000 mg | ORAL_SOLUTION | Freq: Four times a day (QID) | ORAL | Status: DC | PRN

## 2011-10-04 MED ORDER — HYDROMORPHONE HCL PF 1 MG/ML IJ SOLN
0.2500 mg | INTRAMUSCULAR | Status: DC | PRN
  Administered 2011-10-04 – 2011-10-08 (×5): 0.5 mg via INTRAVENOUS
  Filled 2011-10-04: qty 1

## 2011-10-04 MED ORDER — KCL IN DEXTROSE-NACL 20-5-0.45 MEQ/L-%-% IV SOLN
INTRAVENOUS | Status: DC
  Administered 2011-10-04 – 2011-10-05 (×2): via INTRAVENOUS
  Filled 2011-10-04 (×9): qty 1000

## 2011-10-04 MED ORDER — ALBUTEROL SULFATE (5 MG/ML) 0.5% IN NEBU
INHALATION_SOLUTION | RESPIRATORY_TRACT | Status: AC
  Filled 2011-10-04: qty 0.5

## 2011-10-04 MED ORDER — ONDANSETRON HCL 4 MG/2ML IJ SOLN
INTRAMUSCULAR | Status: DC | PRN
  Administered 2011-10-04: 4 mg via INTRAVENOUS

## 2011-10-04 MED ORDER — PROPOFOL 10 MG/ML IV EMUL
INTRAVENOUS | Status: DC | PRN
  Administered 2011-10-04: 50 ug/kg/min via INTRAVENOUS

## 2011-10-04 MED ORDER — THROMBIN 20000 UNITS EX SOLR
CUTANEOUS | Status: AC
  Filled 2011-10-04: qty 20000

## 2011-10-04 MED ORDER — DOUBLE ANTIBIOTIC 500-10000 UNIT/GM EX OINT
TOPICAL_OINTMENT | CUTANEOUS | Status: AC
  Filled 2011-10-04: qty 1

## 2011-10-04 MED ORDER — ALBUTEROL SULFATE HFA 108 (90 BASE) MCG/ACT IN AERS
INHALATION_SPRAY | RESPIRATORY_TRACT | Status: DC | PRN
  Administered 2011-10-04 (×4): 2 via RESPIRATORY_TRACT
  Administered 2011-10-04: 3 via RESPIRATORY_TRACT
  Administered 2011-10-04 (×4): 2 via RESPIRATORY_TRACT

## 2011-10-04 MED ORDER — DEXTROSE 5 % IV SOLN
3.0000 g | Freq: Once | INTRAVENOUS | Status: DC
  Filled 2011-10-04: qty 3000

## 2011-10-04 MED ORDER — MEPERIDINE HCL 25 MG/ML IJ SOLN: 6.2500 mg | INTRAMUSCULAR | Status: DC | PRN

## 2011-10-04 MED ORDER — MENTHOL 3 MG MT LOZG: 1.0000 | LOZENGE | OROMUCOSAL | Status: DC | PRN

## 2011-10-04 MED ORDER — LACTATED RINGERS IV SOLN
INTRAVENOUS | Status: DC
  Administered 2011-10-04 (×3): via INTRAVENOUS

## 2011-10-04 MED ORDER — ACETAMINOPHEN 325 MG PO TABS: 650.0000 mg | ORAL_TABLET | ORAL | Status: DC | PRN

## 2011-10-04 MED ORDER — KETAMINE HCL 100 MG/ML IJ SOLN
INTRAMUSCULAR | Status: AC
  Filled 2011-10-04: qty 1

## 2011-10-04 MED ORDER — CEFAZOLIN SODIUM-DEXTROSE 2-3 GM-% IV SOLR
2.0000 g | Freq: Three times a day (TID) | INTRAVENOUS | Status: AC
  Administered 2011-10-04 – 2011-10-05 (×2): 2 g via INTRAVENOUS
  Filled 2011-10-04 (×3): qty 50

## 2011-10-04 MED ORDER — DIAZEPAM 5 MG PO TABS
5.0000 mg | ORAL_TABLET | Freq: Four times a day (QID) | ORAL | Status: DC | PRN
  Administered 2011-10-04 – 2011-10-06 (×6): 10 mg via ORAL
  Administered 2011-10-06 (×2): 5 mg via ORAL
  Administered 2011-10-06 – 2011-10-08 (×4): 10 mg via ORAL
  Filled 2011-10-04: qty 1
  Filled 2011-10-04 (×8): qty 2
  Filled 2011-10-04: qty 1
  Filled 2011-10-04 (×3): qty 2

## 2011-10-04 MED ORDER — BUPIVACAINE HCL (PF) 0.25 % IJ SOLN
INTRAMUSCULAR | Status: DC | PRN
  Administered 2011-10-04: 27 mL

## 2011-10-04 MED ORDER — LIDOCAINE HCL (CARDIAC) 20 MG/ML IV SOLN
INTRAVENOUS | Status: DC | PRN
  Administered 2011-10-04: 20 mg via INTRAVENOUS

## 2011-10-04 MED ORDER — ACETAMINOPHEN 650 MG RE SUPP: 650.0000 mg | RECTAL | Status: DC | PRN

## 2011-10-04 MED ORDER — FLUTICASONE-SALMETEROL 500-50 MCG/DOSE IN AEPB
1.0000 | INHALATION_SPRAY | Freq: Two times a day (BID) | RESPIRATORY_TRACT | Status: DC
  Administered 2011-10-04 – 2011-10-07 (×7): 1 via RESPIRATORY_TRACT
  Filled 2011-10-04: qty 14

## 2011-10-04 MED ORDER — MIDAZOLAM HCL 2 MG/2ML IJ SOLN
INTRAMUSCULAR | Status: AC
  Administered 2011-10-04: 0.5 mg
  Filled 2011-10-04: qty 2

## 2011-10-04 MED ORDER — IPRATROPIUM BROMIDE 0.02 % IN SOLN
0.5000 mg | Freq: Four times a day (QID) | RESPIRATORY_TRACT | Status: DC
  Administered 2011-10-04 – 2011-10-08 (×14): 0.5 mg via RESPIRATORY_TRACT
  Filled 2011-10-04 (×15): qty 2.5

## 2011-10-04 MED ORDER — MIDAZOLAM HCL 2 MG/2ML IJ SOLN
0.5000 mg | Freq: Once | INTRAMUSCULAR | Status: AC | PRN
  Administered 2011-10-04: 0.5 mg via INTRAVENOUS

## 2011-10-04 MED ORDER — PHENYLEPHRINE HCL 10 MG/ML IJ SOLN
INTRAMUSCULAR | Status: DC | PRN
  Administered 2011-10-04 (×3): 40 ug via INTRAVENOUS

## 2011-10-04 MED ORDER — ALBUTEROL SULFATE (5 MG/ML) 0.5% IN NEBU
INHALATION_SOLUTION | RESPIRATORY_TRACT | Status: AC
  Administered 2011-10-04: 2.5 mg via RESPIRATORY_TRACT
  Filled 2011-10-04: qty 0.5

## 2011-10-04 MED ORDER — PHENOL 1.4 % MT LIQD: 1.0000 | OROMUCOSAL | Status: DC | PRN

## 2011-10-04 SURGICAL SUPPLY — 97 items
BLADE SURG ROTATE 9660 (MISCELLANEOUS) IMPLANT
BUR MATCHSTICK NEURO 3.0 LAGG (BURR) IMPLANT
CARTRIDGE OIL MAESTRO DRILL (MISCELLANEOUS) ×1 IMPLANT
CLOTH BEACON ORANGE TIMEOUT ST (SAFETY) ×2 IMPLANT
CONNECTOR ROD 5.5 OPEN/CLOSED (Connector) ×8 IMPLANT
CONT SPECI 4OZ STER CLIK (MISCELLANEOUS) ×2 IMPLANT
CORDS BIPOLAR (ELECTRODE) ×2 IMPLANT
COVER SURGICAL LIGHT HANDLE (MISCELLANEOUS) ×2 IMPLANT
DECANTER SPIKE VIAL GLASS SM (MISCELLANEOUS) ×4 IMPLANT
DERMABOND ADVANCED (GAUZE/BANDAGES/DRESSINGS) ×1
DERMABOND ADVANCED .7 DNX12 (GAUZE/BANDAGES/DRESSINGS) ×1 IMPLANT
DIFFUSER DRILL AIR PNEUMATIC (MISCELLANEOUS) ×2 IMPLANT
DRAIN CHANNEL 15F RND FF W/TCR (WOUND CARE) IMPLANT
DRAIN TLS ROUND 10FR (DRAIN) IMPLANT
DRAPE PROXIMA HALF (DRAPES) ×8 IMPLANT
DRAPE SURG 17X23 STRL (DRAPES) ×8 IMPLANT
DRAPE TABLE COVER HEAVY DUTY (DRAPES) ×2 IMPLANT
DRSG MEPILEX BORDER 4X12 (GAUZE/BANDAGES/DRESSINGS) ×2 IMPLANT
DRSG MEPILEX BORDER 4X4 (GAUZE/BANDAGES/DRESSINGS) ×2 IMPLANT
DRSG OPSITE 11X17.75 LRG (GAUZE/BANDAGES/DRESSINGS) ×2 IMPLANT
DURAPREP 26ML APPLICATOR (WOUND CARE) ×2 IMPLANT
ELECT BLADE 4.0 EZ CLEAN MEGAD (MISCELLANEOUS) ×2
ELECT CAUTERY BLADE 6.4 (BLADE) ×2 IMPLANT
ELECT REM PT RETURN 9FT ADLT (ELECTROSURGICAL) ×2
ELECTRODE BLDE 4.0 EZ CLN MEGD (MISCELLANEOUS) ×1 IMPLANT
ELECTRODE REM PT RTRN 9FT ADLT (ELECTROSURGICAL) ×1 IMPLANT
EVACUATOR 1/8 PVC DRAIN (DRAIN) IMPLANT
EVACUATOR SILICONE 100CC (DRAIN) ×2 IMPLANT
GAUZE SPONGE 4X4 16PLY XRAY LF (GAUZE/BANDAGES/DRESSINGS) ×8 IMPLANT
GLOVE BIO SURGEON STRL SZ 6.5 (GLOVE) ×2 IMPLANT
GLOVE BIOGEL PI IND STRL 6.5 (GLOVE) ×1 IMPLANT
GLOVE BIOGEL PI IND STRL 7.5 (GLOVE) ×1 IMPLANT
GLOVE BIOGEL PI IND STRL 9 (GLOVE) ×1 IMPLANT
GLOVE BIOGEL PI INDICATOR 6.5 (GLOVE) ×1
GLOVE BIOGEL PI INDICATOR 7.5 (GLOVE) ×1
GLOVE BIOGEL PI INDICATOR 9 (GLOVE) ×1
GLOVE SS BIOGEL STRL SZ 7 (GLOVE) ×1 IMPLANT
GLOVE SS BIOGEL STRL SZ 8.5 (GLOVE) ×1 IMPLANT
GLOVE SUPERSENSE BIOGEL SZ 7 (GLOVE) ×1
GLOVE SUPERSENSE BIOGEL SZ 8.5 (GLOVE) ×1
GOWN PREVENTION PLUS XLARGE (GOWN DISPOSABLE) ×2 IMPLANT
GOWN STRL NON-REIN LRG LVL3 (GOWN DISPOSABLE) ×8 IMPLANT
KIT BASIN OR (CUSTOM PROCEDURE TRAY) ×2 IMPLANT
KIT POSITION SURG JACKSON T1 (MISCELLANEOUS) ×2 IMPLANT
KIT ROOM TURNOVER OR (KITS) ×2 IMPLANT
KIT STIMULAN RAPID CURE  10CC (Orthopedic Implant) ×1 IMPLANT
KIT STIMULAN RAPID CURE 10CC (Orthopedic Implant) ×1 IMPLANT
LONG ROD TEMPLATE ×2 IMPLANT
MANIFOLD NEPTUNE II (INSTRUMENTS) ×2 IMPLANT
MARKER SKIN DUAL TIP RULER LAB (MISCELLANEOUS) ×2 IMPLANT
NEEDLE 22X1 1/2 (OR ONLY) (NEEDLE) ×2 IMPLANT
NEEDLE SPNL 22GX3.5 QUINCKE BK (NEEDLE) ×2 IMPLANT
NS IRRIG 1000ML POUR BTL (IV SOLUTION) ×2 IMPLANT
OIL CARTRIDGE MAESTRO DRILL (MISCELLANEOUS) ×2
PACK LAMINECTOMY ORTHO (CUSTOM PROCEDURE TRAY) ×2 IMPLANT
PACK UNIVERSAL I (CUSTOM PROCEDURE TRAY) ×2 IMPLANT
PAD ARMBOARD 7.5X6 YLW CONV (MISCELLANEOUS) ×4 IMPLANT
PATTIES SURGICAL .5 X.5 (GAUZE/BANDAGES/DRESSINGS) ×2 IMPLANT
PATTIES SURGICAL .5 X1 (DISPOSABLE) ×2 IMPLANT
PATTIES SURGICAL .75X.75 (GAUZE/BANDAGES/DRESSINGS) IMPLANT
PATTIES SURGICAL 1X1 (DISPOSABLE) IMPLANT
PENCIL BUTTON HOLSTER BLD 10FT (ELECTRODE) ×2 IMPLANT
PUTTY OP1 (Orthopedic Implant) ×2 IMPLANT
Polyaxial Screw ×4 IMPLANT
RASP HELIOCORDIAL MED (MISCELLANEOUS) ×2 IMPLANT
ROD CURVED 85MM (Rod) ×2 IMPLANT
ROD CURVED 95MM (Rod) ×2 IMPLANT
ROD STRAIGHT 200MM (Rod) ×2 IMPLANT
SCREW POLYAXIAL 5.5X45MM (Screw) ×4 IMPLANT
SCREW POLYAXIAL 6.5X45MM (Screw) ×6 IMPLANT
SCREW POLYAXIAL 7.5X45 (Screw) ×2 IMPLANT
SCREW SET ATR (Screw) ×8 IMPLANT
SET SCREW (Screw) ×8 IMPLANT
SET SCREW VRST (Screw) ×8 IMPLANT
SPONGE NEURO XRAY DETECT 1X3 (DISPOSABLE) ×2 IMPLANT
SPONGE SURGIFOAM ABS GEL 100 (HEMOSTASIS) IMPLANT
STAPLER VISISTAT (STAPLE) ×2 IMPLANT
SURGIFLO TRUKIT (HEMOSTASIS) ×4 IMPLANT
SUT ETHILON 2 0 FS 18 (SUTURE) ×4 IMPLANT
SUT VIC AB 0 CTX 18 (SUTURE) ×2 IMPLANT
SUT VIC AB 1 CT1 18XCR BRD 8 (SUTURE) ×1 IMPLANT
SUT VIC AB 1 CT1 8-18 (SUTURE) ×1
SUT VIC AB 1 CTX 18 (SUTURE) ×2 IMPLANT
SUT VIC AB 1 CTX 36 (SUTURE) ×1
SUT VIC AB 1 CTX36XBRD ANBCTR (SUTURE) ×1 IMPLANT
SUT VIC AB 2-0 CT1 18 (SUTURE) ×4 IMPLANT
SUT VIC AB 2-0 CT1 27 (SUTURE) ×3
SUT VIC AB 2-0 CT1 TAPERPNT 27 (SUTURE) ×3 IMPLANT
SUT VIC AB 3-0 X1 27 (SUTURE) ×6 IMPLANT
SYR BULB IRRIGATION 50ML (SYRINGE) ×2 IMPLANT
SYR CONTROL 10ML LL (SYRINGE) ×2 IMPLANT
SYSTEM CHEST DRAIN TLS 7FR (DRAIN) IMPLANT
TOWEL OR 17X24 6PK STRL BLUE (TOWEL DISPOSABLE) ×2 IMPLANT
TOWEL OR 17X26 10 PK STRL BLUE (TOWEL DISPOSABLE) ×2 IMPLANT
TRAY FOLEY CATH 14FR (SET/KITS/TRAYS/PACK) ×2 IMPLANT
TUBE SUCT ARGYLE STRL (TUBING) ×4 IMPLANT
WATER STERILE IRR 1000ML POUR (IV SOLUTION) ×8 IMPLANT

## 2011-10-04 NOTE — Brief Op Note (Signed)
Preoperative diagnosis: L4-5 pseudoarthrosis, possible L3-4 pseudarthrosis status post thoracolumbar fusion for scoliosis, broken rods  Postoperative diagnosis: Same  Procedure: Repair pseudoarthrosis, L3-4, L4-5 posterior fusion with local autograft and OP 1; revision pedicle screw and rod fixation L3-4, L4-5, L5-S1, displacing a bilateral rod distally to proximally.  Estimated blood loss: 500 cc  No complications

## 2011-10-04 NOTE — Anesthesia Preprocedure Evaluation (Addendum)
Anesthesia Evaluation  Patient identified by MRN, date of birth, ID band  Reviewed: Allergy & Precautions, H&P , NPO status , Patient's Chart, lab work & pertinent test results  History of Anesthesia Complications (+) PONV and DIFFICULT AIRWAY  Airway Mallampati: II TM Distance: <3 FB Neck ROM: Full    Dental No notable dental hx. (+) Teeth Intact and Dental Advisory Given,    Pulmonary asthma (daily wheezing and inhalers) , Current Smoker,    + wheezing      Cardiovascular +CHF (h/o CHF with pre-eclampsia, ECHO '12 normal LVF, normal valves, EF 60-65%) Rhythm:Regular Rate:Normal     Neuro/Psych  Headaches (migraines 3/months),    GI/Hepatic negative GI ROS, Neg liver ROS,   Endo/Other  Morbid obesity  Renal/GU negative Renal ROS     Musculoskeletal   Abdominal (+) + obese,   Peds  Hematology   Anesthesia Other Findings   Reproductive/Obstetrics LMP 09/25/11                          Anesthesia Physical Anesthesia Plan  ASA: II  Anesthesia Plan: General   Post-op Pain Management:    Induction: Intravenous  Airway Management Planned: Oral ETT  Additional Equipment:   Intra-op Plan:   Post-operative Plan: Extubation in OR  Informed Consent: I have reviewed the patients History and Physical, chart, labs and discussed the procedure including the risks, benefits and alternatives for the proposed anesthesia with the patient or authorized representative who has indicated his/her understanding and acceptance.   Dental advisory given  Plan Discussed with: CRNA and Surgeon  Anesthesia Plan Comments: (Plan routine monitors, GETA)        Anesthesia Quick Evaluation

## 2011-10-04 NOTE — Anesthesia Procedure Notes (Signed)
Procedure Name: Intubation Date/Time: 10/04/2011 8:54 AM Performed by: Jefm Miles E Pre-anesthesia Checklist: Patient identified, Timeout performed, Emergency Drugs available, Suction available and Patient being monitored Patient Re-evaluated:Patient Re-evaluated prior to inductionOxygen Delivery Method: Circle system utilized Preoxygenation: Pre-oxygenation with 100% oxygen Intubation Type: IV induction Ventilation: Mask ventilation without difficulty Laryngoscope Size: Mac and 3 Grade View: Grade I Tube type: Oral Tube size: 7.0 mm Number of attempts: 1 Airway Equipment and Method: Stylet Placement Confirmation: ETT inserted through vocal cords under direct vision,  breath sounds checked- equal and bilateral and positive ETCO2 Secured at: 22 cm Tube secured with: Tape Dental Injury: Teeth and Oropharynx as per pre-operative assessment  Comments: Intubation with Mac 3, Grade 1 view and inserted a size 7.0 ETT without any difficulties.

## 2011-10-04 NOTE — Transfer of Care (Addendum)
Immediate Anesthesia Transfer of Care Note  Patient: [name]  Procedure(s) Performed: Procedure(s) (LRB): FUSION POSTERIOR SPINAL MULTILEVEL (Bilateral)  Patient Location: PACU  Anesthesia Type: General  Level of Consciousness: awake, oriented and patient cooperative  Airway & Oxygen Therapy: Patient Spontanous Breathing and Patient connected to face mask oxygen  Post-op Assessment: Report given to PACU RN  Post vital signs: Reviewed and stable  Complications: No apparent anesthesia complications

## 2011-10-04 NOTE — Interval H&P Note (Signed)
History physical was dictated this morning in no update is necessary.

## 2011-10-04 NOTE — H&P (Signed)
Chief complaint: Increasing back pain and spinal deformity  His present illness this gentleman has had a total of 4 surgeries. I have done to, 2 previous lumbar spine operations were done. She's had a thoracolumbar fusion for scoliosis. Recently she has had increasing low back pain having been in a steady chronically painful state for some time. She's also had marked increase in deformity with severe imbalance to the right side in the coronal plane.  Recent x-ray show fracture of rods at the L4 level. This is obviously associated with an underlying pseudoarthrosis.  Review of systems she takes Imitrex for migraine headaches. The other medications she takes are Norco and Valium, prescribed by.  Physical examination: She is moderately obese. She stands listing significantly to the right side. This causes her significant difficulty with walking.  Cardiovascular-rhythm regular, no peripheral edema, normal heart sounds  Her spirits are a-no adventitious breath sounds  Abdomen-soft nontender no organomegaly palpated  Muscle skeletal/neurological imbalance in coronal plane of spine, list the left no neurovascular deficits lower to damage  Diagnosis status post thoracolumbar fusion for spinal deformity, pseudoarthrosis with increasing deformity  Discussion this time we have to intervene surgically. She will the lumbar osteotomy to correct the deformity and re\re fixation to and room grafting to get the fusion to heal.  Risks include death, heart attack, stroke, phlebitis leg or pelvic veins, pulmonary him with, pneumonia. As it risks discussed include neurologic injury, spinal fluid leak, wound hematoma, failure to fuse again.

## 2011-10-04 NOTE — Anesthesia Postprocedure Evaluation (Signed)
  Anesthesia Post-op Note  Patient: [name]  Procedure(s) Performed: Procedure(s) (LRB): FUSION POSTERIOR SPINAL MULTILEVEL (Bilateral)  Patient Location: PACU  Anesthesia Type: General  Level of Consciousness: sedated  Airway and Oxygen Therapy: Patient Spontanous Breathing and Patient connected to nasal cannula oxygen  Post-op Pain: mild  Post-op Assessment: Post-op Vital signs reviewed, Patient's Cardiovascular Status Stable, Respiratory Function Stable, Patent Airway, No signs of Nausea or vomiting and Pain level controlled  Post-op Vital Signs: Reviewed and stable  Complications: No apparent anesthesia complications

## 2011-10-04 NOTE — H&P (View-Only) (Signed)
Pt said that she was admitted to the hospital in Sept of 2012 with resp infection.  Pt has history of Asthma uses Advair and Albuterol inhalers.  Pt does not have a medical Dr.  Pts gyn- Dr Jackson Moore writes prescripitions for Inhalers. Pt denies any chest pain or shortness of breath at this time. Pt was seen in 2009 for chest pain and had a 2D echo, pt states that cardiologist said that she only needs to see them prn.  Dr Stringer notified of the above information, no new orders given.                                  

## 2011-10-04 NOTE — Consult Note (Signed)
Medical Consultation   [name]  WUJ:[id number]  DOB: 1980/09/28  DOA: 10/04/2011  PCP: No primary provider on file.  Requesting physician: Dr Alveda Reasons  Reason for consultation: Wheezing and shortness of breath post-op and during the surgery   History of Present Illness: Patient is a 31 year old Caucasian female with past medical history significant for asthma, chronic back pain who underwent posterior fusion L4-5, L5-S1 today under general anesthesia. Medicine consult was obtained for diffuse wheezing noted during the surgery and postop. Patient was seen in the PACU, alert and awake after the surgery. She states that she is on Proventil inhaler and Advair at home. Patient has had previous admissions for asthma exacerbations. She denies any nausea, vomiting, fevers and productive cough. In the PACU, patient still had diffuse wheezing and on oxygen supplementation via nasal cannula. She is also an active nicotine user.  Allergies:  No Known Allergies    Past Medical History  Diagnosis Date  . Complication of anesthesia   . Difficult intubation     "needs small tube"  . PONV (postoperative nausea and vomiting)   . Family history of anesthesia complication     Nausea  . Asthma   . CHF (congestive heart failure)     "with pregnancy, toxcemia 2006"  not seeing a cardiologist now.  . Shortness of breath     Asthma  . Headache     takes Imitrex- last one 1 week ago  . Cancer     cervical ca  . Arthritis   . Degenerative disk disease     Past Surgical History  Procedure Date  . [facility]     x2  . Lumbar fusion 2000     2000 fusion  Total of 5 back surgeries  . Knee arthroscopy   . Tonsillectomy     and tubes    Social History:  reports that she has been smoking.  She smokes about 6-7 cigarettes daily for last 2 years. She reports that she drinks alcohol occasionally. She reports that she does not use illicit drugs. She lives at home with her  husband.  Family History reviewed.  Patient father has a history of heart problems, diabetes, lung cancer and also renal disease currently hemodialysis. Patient's mother has a history of COPD and cervical cancer.  Review of Systems:   Constitutional: Denies fever, chills, diaphoresis, appetite change and fatigue.  HEENT: Denies photophobia, eye pain, redness, hearing loss, ear pain, congestion, sore throat, rhinorrhea, sneezing, mouth sores, trouble swallowing, neck pain, neck stiffness and tinnitus.   Respiratory: Please see history of present illness  Cardiovascular: Denies chest pain, palpitations and leg swelling.  Gastrointestinal: Denies nausea, vomiting, abdominal pain, diarrhea, constipation, blood in stool and abdominal distention.  Genitourinary: Denies dysuria, urgency, frequency, hematuria, flank pain and difficulty urinating.  Musculoskeletal: Denies myalgias, back pain, joint swelling, arthralgias and gait problem.  Skin: Denies pallor, rash and wound.  Neurological: Patient is postop today after spinal surgery Hematological: Denies adenopathy. Easy bruising, personal or family bleeding history  Psychiatric/Behavioral: Denies suicidal ideation, mood changes, confusion, nervousness, sleep disturbance and agitation   Physical Exam: Blood pressure 99/38, pulse 80, temperature 97 F (36.1 C), temperature source Oral, resp. rate 15, last menstrual period 09/25/2011, SpO2 100.00%.  General: Alert and awake, oriented seen in PACU, not in any acute distress, completing full sentences. HEENT: normocephalic, atraumatic, anicteric sclera, pupils reactive to light and accommodation, EOMI, oropharynx clear CVS: S1-S2 clear, no murmur rubs or gallops Chest: Diffuse  bilateral wheezing but completing full sentences  Abdomen: soft nontender, nondistended, normal bowel sounds, no organomegaly Extremities: no cyanosis, clubbing or edema noted bilaterally Neuro: Not assessed Psych: alert and  oriented, stable mood and affect Skin: Dressing intact on the back  Labs on Admission:  Basic Metabolic Panel:  Lab 10/04/11 1610  NA 137  K 3.9  CL 101  CO2 25  GLUCOSE 101*  BUN 9  CREATININE 0.65  CALCIUM 9.5  MG --  PHOS --   Liver Function Tests:  Lab 10/04/11 0633  AST 17  ALT 20  ALKPHOS 57  BILITOT 0.2*  PROT 6.8  ALBUMIN 3.8   CBC:  Lab 10/04/11 0640  WBC 8.5  NEUTROABS --  HGB 14.0  HCT 41.0  MCV 93.8  PLT 244   Inpatient Medications:   Scheduled Meds:    . albuterol      . albuterol      . albuterol      . bupivacaine liposome  20 mL Infiltration Once  .  ceFAZolin (ANCEF) IV  3 g Intravenous 60 min Pre-Op  . HYDROmorphone      . HYDROmorphone PCA 0.3 mg/mL   Intravenous Q4H  . HYDROmorphone PCA 0.3 mg/mL      . midazolam      . mupirocin ointment   Nasal BID  . mupirocin ointment      . DISCONTD:  ceFAZolin (ANCEF) IV  3 g Intravenous Once  . DISCONTD: chlorhexidine  60 mL Topical Once   Continuous Infusions:    . lactated ringers    . DISCONTD: sodium chloride       Radiological Exams on Admission: Dg Chest 2 View  10/04/2011  *RADIOLOGY REPORT*  Clinical Data: Preop for lumbar fusion.  Asthma.  Smoker.  Cough.  CHEST - 2 VIEW  Comparison: 11/07/2010  Findings: The heart size and pulmonary vascularity are normal. The lungs appear clear and expanded without focal air space disease or consolidation. No blunting of the costophrenic angles.  No pneumothorax.  Mediastinal contours appear intact.  Postoperative changes in the thoracolumbar spine.  Degenerative changes in the thoracic spine.  IMPRESSION: No evidence of active pulmonary disease.   Original Report Authenticated By: Marlon Pel, M.D.    Dg Lumbar Spine 2-3 Views  10/04/2011  *RADIOLOGY REPORT*  Clinical Data: Lumbar fusion revision.  LUMBAR SPINE - 2-3 VIEW  Comparison: 10/04/2011 CT  Findings: Initial image demonstrates posterior fusion changes from the lower thoracic  spine to the sacrum.  The second PA view and cross-table lateral views demonstrate revision of the posterior fusion.  No hardware or bony complicating feature.  IMPRESSION: Interval revision of the posterior fusion.  No complicating feature.   Original Report Authenticated By: Cyndie Chime, M.D.    Ct Lumbar Spine Wo Contrast  10/04/2011  *RADIOLOGY REPORT*  Clinical Data: Preop for lumbar spinal fusion.  Pseudoarthrosis and rod fracture.  CT LUMBAR SPINE WITHOUT CONTRAST,CT THORACIC SPINE WITHOUT CONTRAST  Technique:  Multidetector CT imaging of the lumbar spine was performed without intravenous contrast administration. Multiplanar CT image reconstructions were also generated.,Technique: Multidetector CT imaging of the thoracic spine was performed without  Comparison: CT myelogram 01/08/2007.  Findings: There are pedicle screws and posterior rods extending from T9-S1.  The rods are fractured at L4.  Interbody ray cage fusion noted at L5-S1.  The pedicle screws appear well-positioned and no obvious pedicle screw fractures or malposition.  There appears to be solid posterior fusion down  to L4.  Incomplete posterior fusion/pseudoarthrosis at L4-5.  Solid appearing interbody ray cage fusion at L5-S1 without complicating features.  There is mild bony foraminal encroachment bilaterally.  IMPRESSION:  1.  Fracture of the Harrington rods and L4. 2.  Incomplete posterior fusion/pseudoarthrosis at L4-5. 3.  Solid appearing interbody ray cage fusion at L5-S1. 4.  No spinal or foraminal stenosis is demonstrated except for mild bony foraminal encroachment at L5-S1 bilaterally.   Original Report Authenticated By: P. Loralie Champagne, M.D.    Ct T Spine Ltd Wo Or W/ Cm  10/04/2011  *RADIOLOGY REPORT*  Clinical Data: Preop for lumbar spinal fusion.  Pseudoarthrosis and rod fracture.  CT LUMBAR SPINE WITHOUT CONTRAST,CT THORACIC SPINE WITHOUT CONTRAST  Technique:  Multidetector CT imaging of the lumbar spine was performed  without intravenous contrast administration. Multiplanar CT image reconstructions were also generated.,Technique: Multidetector CT imaging of the thoracic spine was performed without  Comparison: CT myelogram 01/08/2007.  Findings: There are pedicle screws and posterior rods extending from T9-S1.  The rods are fractured at L4.  Interbody ray cage fusion noted at L5-S1.  The pedicle screws appear well-positioned and no obvious pedicle screw fractures or malposition.  There appears to be solid posterior fusion down to L4.  Incomplete posterior fusion/pseudoarthrosis at L4-5.  Solid appearing interbody ray cage fusion at L5-S1 without complicating features.  There is mild bony foraminal encroachment bilaterally.  IMPRESSION:  1.  Fracture of the Harrington rods and L4. 2.  Incomplete posterior fusion/pseudoarthrosis at L4-5. 3.  Solid appearing interbody ray cage fusion at L5-S1. 4.  No spinal or foraminal stenosis is demonstrated except for mild bony foraminal encroachment at L5-S1 bilaterally.   Original Report Authenticated By: P. Loralie Champagne, M.D.     Impression/Recommendations Principal Problem:  *Back pain status post lumbar spinal fusion surgery today - Management per neurosurgery, primary service  Active Problems:  Asthma exacerbation: Patient has a prior history of noncompliance per the chart review however the stress, anesthesia and surgery probably precipitated her asthma exacerbation. - She's not in any acute respiratory distress currently, I will place her on scheduled albuterol and Atrovent nebulizers breathing treatments. - Continue O2 via nasal cannula, placed back on Advair at her home dose - Given that she is diffusely wheezing, I placed her on IV steroids today. If tolerating diet tomorrow, she can be changed to oral prednisone.    [facility] hospitalist service will continue to follow the patient, Thank you for this consultation.  Time Spent  45 minutes  Fareedah Mahler M.D. [facility] 10/04/2011, 5:44 PM

## 2011-10-04 NOTE — Preoperative (Signed)
Beta Blockers   Reason not to administer Beta Blockers:Not Applicable 

## 2011-10-04 NOTE — Op Note (Signed)
Preoperative diagnosis: Status post thoracolumbar fusion for scoliosis; L4-5 pseudoarthrosis, possible L3-4 pseudarthrosis, recurrent deformity failed fixation-rods broken bilaterally at L4-5  Post procedure diagnosis: Same  Operative procedure: Posterior fusion L4-5, L5-S1 utilizing localized graft and OP1; vision L3-S1 fixation bilaterally; and distal to proximal rods bilaterally  Operative procedure: Patient was placed under general endotracheal anesthesia. Cefazolin was infused intravenously for prophylaxis against infection. Sequential compression devices were placed on both lower extremities. A Foley catheter was placed in the bladder. Neuro monitoring electrodes were attached to the scalp, upper extremities lower extremities  She was positioned prone on a Jackson frame. Care was taken to position the upper extremites so as to avoid hyperflexion of the elbows and so as to avoid hyperflexion and hyperabduction of the shoulders bilaterally. The upper extremities were padded with foam axillae to hands. The thighs knees shins and ankles were supported on pillows. Prior to preparation of the skin, a postero-anterior radiograph was taken. This showed that the deformity which had progressed subsequent to rod the fracture, completely reduced on the table. Therefore I concluded that we would not have to perform an osteotomy, but that we would probably do a posterior interbody fusion.  Hair was clipped from the lumbar area.  The thoracolumbar area was prepped with DuraPrep and draped in the standard fashion. The drains were secured with Ioban. Prior to preparation and draping the previous midline incision had been marked with a skin marker.  A timeout was held during which the usual information was confirmed/discussed.  The skin was scored in elliptical fashion  around the scar of the lower 50% of the incision. The subcutaneous tissue was injected with a mixture of quarter percent plain Marcaine and 1%  lidocaine with epinephrine. The scar was then excised with cutting current. Dissection was carried down through the adipose layer to the thoracolumbar fascia. The lumbar fascia was incised vertically in the midline. The incision was deepened through paraspinal scar and muscle to the posterior aspect of the lumbar spine in the more proximal part of the wound.  Scar  and muscle were mobilized bilaterally until the pedicle screws and rods could be identified. We then worked our way distally using the implants as reference points, so as to prevent  making the incision  because there was a laminectomy previously L5-S1.  Once the bilateral rods and screws had been identified, from L3-S1, the set screws were removed from the pedicle screws. The broken piece of distal rod, intact at L5-S1, broken at between L4 and 5 were removed. We dealt with the right side first. I then cut with rod with a high-speed cutting bur easily proximal to the L3 screw. We were then able to remove the remaining rod at L3 and 4.  I then removed the L3, L4, L5 and S1 pedicle screws on the right side. I used an osteotome to remove bone from the area of the L4-5 facet  joint on the right side.  Bone was decorticated at the L3-4 and L4-5 levels and a bone  harvested for use as graft. Each pedicle hole was tapped and similar sized screw placed from that which was removed. A side to side connector was placed on the rod between the 3 and L4 screws and a side to side connector was placed on the distal end of the proximal rod which I had amputated just above the L3 screw. Once the main rod from L3-S1 had been provisionally fixed but the set screws not yet torqued, a rod  measuring about 70 mm was contoured so that it could catch between side to side connectors, thereby connecting the upper thoracolumbar construct to the lower lumbar construct.  We then did essentially the same thing on the patient's left side. 6 screws were removed. The distal fracture  the rod was removed. The rod was amputated just above the L3 pedicle screw proximally. The pedicle holes were tapped and the screws placed at each level. The side connectors were placed as described for the right side and a ultimately short rod contoured and placed between the upper and lower construct.  Once we had taken crosstable lateral radiograph and a PA radiograph showed satisfactory position of the implants, all the set screws were torqued, pedicle screw set screw and side connectors and screws.  We had a large quantity of local graft which was harvested and which was mixed with 10 cc of OP 1. This was then packed into the interlaminar spaces at L3-4 L4-5 as well as into the area of the resected prostatic joint L4-5 bilaterally.  We then placed 10 cc of stimuli and beats impregnated vancomycin in the wound after placing a slab of Gelfoam over the graft material.  A 15-gauge Blake drain was placed subfascially and brought up through the skin to the right side where it was secured with a 2-0 nylon suture. The lumbar fascia was then reapposed using interrupted mattress sutures of #1 Vicryl. A 1/8 inch Hemovac drain was placed in the subcutaneous layer and also brought out through the skin to the right side where was also secured with a 2-0 nylon suture. The subcutaneous layer was closed using multiple interrupted inverted 0 Vicryl sutures and 2-0 Vicryl sutures. Skin was then closed using 3-0 undyed Vicryl in subcuticular fashion.  Mepilex dressings were then applied.  Estimated blood loss is estimated 500 cc. There were no intraoperative complications. The sponge and needle counts were correct.  Initially, when I realized I was not going to have to perform a posterior osteotomy, I thought that I would perform posterior interbody fusion. However since this would necessitate a laminectomy, exposing the nerve roots, and subtracting from the total amount posterior bone to which graft to be applied I  decided to not proceed with the posterior interbody fusion. Therefore it is my plan to strongly suggest to the patient that we do a delayed anterior interbody fusion at L4-5 and possibly L3-4.  At he time of dictation she is not yet been transferred to the postanesthetic recovery room and therefore no examination is reported here.  He then contoured a titanium rod of appropriate length and placed a side to side connector on the rod between the L3 and 4 Bruce.

## 2011-10-05 ENCOUNTER — Inpatient Hospital Stay (HOSPITAL_COMMUNITY): Payer: Medicare Other

## 2011-10-05 ENCOUNTER — Encounter (HOSPITAL_COMMUNITY): Payer: Self-pay | Admitting: Orthopedic Surgery

## 2011-10-05 LAB — BASIC METABOLIC PANEL
Calcium: 8.9 mg/dL (ref 8.4–10.5)
GFR calc non Af Amer: >90 mL/min (ref >90)
Glucose, Bld: 194 mg/dL — ABNORMAL HIGH (ref 70–99)
Sodium: 134 mEq/L — ABNORMAL LOW (ref 135–145)

## 2011-10-05 LAB — CBC
Hemoglobin: 11.7 g/dL — ABNORMAL LOW (ref 12.0–15.0)
MCH: 32.6 pg (ref 26.0–34.0)
MCHC: 34.4 g/dL (ref 30.0–36.0)
RDW: 12.4 % (ref 11.5–15.5)

## 2011-10-05 MED ORDER — VARENICLINE TARTRATE 0.5 MG PO TABS
0.5000 mg | ORAL_TABLET | Freq: Every day | ORAL | Status: AC
  Administered 2011-10-06 – 2011-10-08 (×3): 0.5 mg via ORAL
  Filled 2011-10-05 (×3): qty 1

## 2011-10-05 MED ORDER — VARENICLINE TARTRATE 0.5 MG PO TABS
0.5000 mg | ORAL_TABLET | Freq: Two times a day (BID) | ORAL | Status: DC
  Administered 2011-10-08: 0.5 mg via ORAL
  Filled 2011-10-05 (×2): qty 1

## 2011-10-05 MED ORDER — VARENICLINE TARTRATE 1 MG PO TABS: 1.0000 mg | ORAL_TABLET | Freq: Two times a day (BID) | ORAL | Status: DC

## 2011-10-05 MED ORDER — LEVOFLOXACIN IN D5W 750 MG/150ML IV SOLN
750.0000 mg | INTRAVENOUS | Status: DC
  Administered 2011-10-05 – 2011-10-08 (×4): 750 mg via INTRAVENOUS
  Filled 2011-10-05 (×7): qty 150

## 2011-10-05 MED ORDER — PANTOPRAZOLE SODIUM 40 MG PO TBEC
40.0000 mg | DELAYED_RELEASE_TABLET | Freq: Every day | ORAL | Status: DC
  Administered 2011-10-05 – 2011-10-07 (×3): 40 mg via ORAL
  Filled 2011-10-05 (×3): qty 1

## 2011-10-05 MED FILL — Sodium Chloride Irrigation Soln 0.9%: Qty: 3000 | Status: AC

## 2011-10-05 MED FILL — Sodium Chloride IV Soln 0.9%: INTRAVENOUS | Qty: 1000 | Status: AC

## 2011-10-05 MED FILL — Heparin Sodium (Porcine) Inj 1000 Unit/ML: INTRAMUSCULAR | Qty: 30 | Status: AC

## 2011-10-05 NOTE — Progress Notes (Signed)
Patient ID: [name]  female  WUJ:[id number]    DOB: 03/05/1980    DOA: 10/04/2011  PCP: No primary provider on file.   [facility]  CONSULT FOLLOW-UP  Subjective: Post-op day 1, sounding congested and wheezing still, receiving breathing Rx. Coughing, no fevers/chills, nausea, abd pain  Objective: Weight change:   Intake/Output Summary (Last 24 hours) at 10/05/11 0837 Last data filed at 10/05/11 0640  Gross per 24 hour  Intake   4580 ml  Output   1955 ml  Net   2625 ml   Blood pressure 94/46, pulse 92, temperature 98.4 F (36.9 C), temperature source Oral, resp. rate 18, last menstrual period 09/25/2011, SpO2 100.00%.  Physical Exam: General: Alert and awake, oriented x3, not in any acute distress. HEENT: anicteric sclera, pupils reactive to light and accommodation, EOMI CVS: S1-S2 clear, no murmur rubs or gallops Chest: somewhat congested, wheezing b/l Abdomen: soft nontender, nondistended, normal bowel sounds, no organomegaly Extremities: no cyanosis, clubbing or edema noted bilaterally   Lab Results: Basic Metabolic Panel:  Lab 10/05/11 9562 10/04/11 0633  NA 134* 137  K 3.7 3.9  CL 100 101  CO2 24 25  GLUCOSE 194* 101*  BUN 4* 9  CREATININE 0.54 0.65  CALCIUM 8.9 9.5  MG -- --  PHOS -- --   Liver Function Tests:  Lab 10/04/11 0633  AST 17  ALT 20  ALKPHOS 57  BILITOT 0.2*  PROT 6.8  ALBUMIN 3.8   CBC:  Lab 10/05/11 0500 10/04/11 0640  WBC 13.1* 8.5  NEUTROABS -- --  HGB 11.7* 14.0  HCT 34.0* 41.0  MCV 94.7 93.8  PLT 236 244     Micro Results: Recent Results (from the past 240 hour(s))  SURGICAL PCR SCREEN     Status: Normal   Collection Time   10/04/11  6:41 AM      Component Value Range Status Comment   MRSA, PCR NEGATIVE  NEGATIVE Final    Staphylococcus aureus NEGATIVE  NEGATIVE Final     Studies/Results: Dg Chest 2 View  10/04/2011  *RADIOLOGY REPORT*  Clinical Data: Preop for lumbar fusion.  Asthma.  Smoker.  Cough.   CHEST - 2 VIEW  Comparison: 11/07/2010  Findings: The heart size and pulmonary vascularity are normal. The lungs appear clear and expanded without focal air space disease or consolidation. No blunting of the costophrenic angles.  No pneumothorax.  Mediastinal contours appear intact.  Postoperative changes in the thoracolumbar spine.  Degenerative changes in the thoracic spine.  IMPRESSION: No evidence of active pulmonary disease.   Original Report Authenticated By: Marlon Pel, M.D.    Dg Lumbar Spine 2-3 Views  10/04/2011  *RADIOLOGY REPORT*  Clinical Data: Lumbar fusion revision.  LUMBAR SPINE - 2-3 VIEW  Comparison: 10/04/2011 CT  Findings: Initial image demonstrates posterior fusion changes from the lower thoracic spine to the sacrum.  The second PA view and cross-table lateral views demonstrate revision of the posterior fusion.  No hardware or bony complicating feature.  IMPRESSION: Interval revision of the posterior fusion.  No complicating feature.   Original Report Authenticated By: Cyndie Chime, M.D.    Ct Lumbar Spine Wo Contrast  10/04/2011  *RADIOLOGY REPORT*  Clinical Data: Preop for lumbar spinal fusion.  Pseudoarthrosis and rod fracture.  CT LUMBAR SPINE WITHOUT CONTRAST,CT THORACIC SPINE WITHOUT CONTRAST  Technique:  Multidetector CT imaging of the lumbar spine was performed without intravenous contrast administration. Multiplanar CT image reconstructions were also generated.,Technique: Multidetector  CT imaging of the thoracic spine was performed without  Comparison: CT myelogram 01/08/2007.  Findings: There are pedicle screws and posterior rods extending from T9-S1.  The rods are fractured at L4.  Interbody ray cage fusion noted at L5-S1.  The pedicle screws appear well-positioned and no obvious pedicle screw fractures or malposition.  There appears to be solid posterior fusion down to L4.  Incomplete posterior fusion/pseudoarthrosis at L4-5.  Solid appearing interbody ray cage fusion  at L5-S1 without complicating features.  There is mild bony foraminal encroachment bilaterally.  IMPRESSION:  1.  Fracture of the Harrington rods and L4. 2.  Incomplete posterior fusion/pseudoarthrosis at L4-5. 3.  Solid appearing interbody ray cage fusion at L5-S1. 4.  No spinal or foraminal stenosis is demonstrated except for mild bony foraminal encroachment at L5-S1 bilaterally.   Original Report Authenticated By: P. Loralie Champagne, M.D.    Ct T Spine Ltd Wo Or W/ Cm  10/04/2011  *RADIOLOGY REPORT*  Clinical Data: Preop for lumbar spinal fusion.  Pseudoarthrosis and rod fracture.  CT LUMBAR SPINE WITHOUT CONTRAST,CT THORACIC SPINE WITHOUT CONTRAST  Technique:  Multidetector CT imaging of the lumbar spine was performed without intravenous contrast administration. Multiplanar CT image reconstructions were also generated.,Technique: Multidetector CT imaging of the thoracic spine was performed without  Comparison: CT myelogram 01/08/2007.  Findings: There are pedicle screws and posterior rods extending from T9-S1.  The rods are fractured at L4.  Interbody ray cage fusion noted at L5-S1.  The pedicle screws appear well-positioned and no obvious pedicle screw fractures or malposition.  There appears to be solid posterior fusion down to L4.  Incomplete posterior fusion/pseudoarthrosis at L4-5.  Solid appearing interbody ray cage fusion at L5-S1 without complicating features.  There is mild bony foraminal encroachment bilaterally.  IMPRESSION:  1.  Fracture of the Harrington rods and L4. 2.  Incomplete posterior fusion/pseudoarthrosis at L4-5. 3.  Solid appearing interbody ray cage fusion at L5-S1. 4.  No spinal or foraminal stenosis is demonstrated except for mild bony foraminal encroachment at L5-S1 bilaterally.   Original Report Authenticated By: P. Loralie Champagne, M.D.     Medications: Scheduled Meds:   . albuterol      . bupivacaine liposome  20 mL Infiltration Once  .  ceFAZolin (ANCEF) IV  3 g  Intravenous 60 min Pre-Op  .  ceFAZolin (ANCEF) IV  2 g Intravenous Q8H  . docusate sodium  100 mg Oral BID  . Fluticasone-Salmeterol  1 puff Inhalation Q12H  . Fluticasone-Salmeterol  1 puff Inhalation BID  . HYDROmorphone      . HYDROmorphone PCA 0.3 mg/mL   Intravenous Q4H  . HYDROmorphone PCA 0.3 mg/mL      . ipratropium  0.5 mg Nebulization Q6H  . levalbuterol  0.63 mg Nebulization Q6H  . levofloxacin (LEVAQUIN) IV  750 mg Intravenous Q24H  . methylPREDNISolone (SOLU-MEDROL) injection  60 mg Intravenous Q6H  . midazolam      . mupirocin ointment   Nasal BID  . pantoprazole (PROTONIX) IV  40 mg Intravenous QHS  . senna  1 tablet Oral BID  . sodium chloride  3 mL Intravenous Q12H  . DISCONTD: albuterol  2.5 mg Nebulization Q6H  . DISCONTD: albuterol      . DISCONTD:  ceFAZolin (ANCEF) IV  3 g Intravenous Once  . DISCONTD: chlorhexidine  60 mL Topical Once  . DISCONTD: methylPREDNISolone (SOLU-MEDROL) injection  60 mg Intravenous Q6H   Continuous Infusions:   . sodium chloride    .  dextrose 5 % and 0.45 % NaCl with KCl 20 mEq/L 100 mL/hr at 10/04/11 2143  . lactated ringers    . DISCONTD: sodium chloride       Assessment/Plan:   Principal Problem:  *Back pain status post lumbar spinal fusion surgery PO day #1 - Management per neurosurgery, primary service   Active Problems:  Acute Asthma exacerbation: Patient has a prior history of noncompliance per the chart review however the stress, anesthesia and surgery probably precipitated her asthma exacerbation. She may have aspirated during surgery as she sounds somewhat congested today. - Will get portable CXR, leukocytosis likely sec to steroids.  - continue scheduled xopenex and Atrovent nebulizers breathing treatments, advair, IV steroids, O2 via [place] - Discussed with RT, added flutter valve  - I placed her on levaquin to reduce bronchial inflammation along with IV steroids. Levaquin can be DC'ed if no fevers or PNA on CXR  and doing better tomorrow.  [facility] hospitalist service will continue to follow the patient.   DVT Prophylaxis: SCD's  Code Status:  Disposition:per primary service   LOS: 1 day   RAI,RIPUDEEP M.D. [facility] 10/05/2011, 8:37 AM Pager: [phone]  If 7PM-7AM, please contact night-coverage [email or web address] Password [email or web address]

## 2011-10-05 NOTE — Evaluation (Signed)
Physical Therapy Evaluation Patient Details Name: [name] MRN: [id number] DOB: November 05, 1980 Today's Date: 10/05/2011 Time: 4098-1191 PT Time Calculation (min): 18 min  PT Assessment / Plan / Recommendation Clinical Impression  Pt is a 31 y.o. female s/p lumbar spine sx.  Patient is in significant amount of pain at this time.  Patient demonstrates good functional mobility but presents with some deficits in activity tolerance and safety awareness for spinal precautions.  Expressed importance of maintaining precautions.  Patient will benefit from continued skilled PT to maximize independence for d/c home and to ensure safe negotiation of stairs.      PT Assessment  Patient needs continued PT services    Follow Up Recommendations  No PT follow up    Barriers to Discharge        Equipment Recommendations  3 in 1 bedside comode;Rolling walker with 5" wheels;Tub/shower bench    Recommendations for Other Services     Frequency Min 5X/week    Precautions / Restrictions Precautions Precautions: Back Precaution Booklet Issued: Yes (comment)   Pertinent Vitals/Pain 9.5/10      Mobility  Bed Mobility Bed Mobility: Sit to Sidelying Left;Sit to Supine Sit to Supine: 3: Mod assist;HOB flat Sit to Sidelying Left: 3: Mod assist Details for Bed Mobility Assistance: vc for back precautions and log rolling Transfers Sit to Stand: 5: Supervision;With upper extremity assist;From chair/3-in-1 Stand to Sit: 5: Supervision;With upper extremity assist;To bed Ambulation/Gait Ambulation/Gait Assistance: 5: Supervision Ambulation Distance (Feet): 12 Feet Assistive device: Rolling walker;None Gait Pattern: Step-to pattern;Antalgic;Decreased stride length Gait velocity: decreased General Gait Details: rigid posture    Exercises     PT Diagnosis: Difficulty walking;Abnormality of gait;Acute pain  PT Problem List: Decreased strength;Decreased activity tolerance;Decreased  mobility;Decreased safety awareness;Pain PT Treatment Interventions: Stair training;Gait training;Patient/family education;Therapeutic activities;Functional mobility training   PT Goals Acute Rehab PT Goals PT Goal Formulation: With patient Time For Goal Achievement: 10/10/11 Potential to Achieve Goals: Good Pt will go Supine/Side to Sit: with modified independence PT Goal: Supine/Side to Sit - Progress: Goal set today Pt will Ambulate: >150 feet;with modified independence PT Goal: Ambulate - Progress: Goal set today Pt will Go Up / Down Stairs: 3-5 stairs;with modified independence PT Goal: Up/Down Stairs - Progress: Goal set today  Visit Information  Last PT Received On: 10/05/11 Assistance Needed: +1    Subjective Data  Subjective: I've never felt this much pain before ever Patient Stated Goal: to go home   Prior Functioning  Home Living Lives With: Spouse Available Help at Discharge: Family;Friend(s);Available PRN/intermittently Type of Home: House Home Access: Stairs to enter [employer] of Steps: 2 Entrance Stairs-Rails: None Home Layout: One level Bathroom Shower/Tub: Sport and exercise psychologist: Standard Bathroom Accessibility: No Home Adaptive Equipment: None Prior Function Level of Independence: Independent Able to Take Stairs?: Yes Communication Communication: No difficulties Dominant Hand: Right    Cognition  Overall Cognitive Status: Appears within functional limits for tasks assessed/performed Arousal/Alertness: Awake/alert Orientation Level: Appears intact for tasks assessed Behavior During Session: [facility] for tasks performed    Extremity/Trunk Assessment Right Upper Extremity Assessment RUE ROM/Strength/Tone: Within functional levels Left Upper Extremity Assessment LUE ROM/Strength/Tone: Within functional levels Right Lower Extremity Assessment RLE ROM/Strength/Tone: WFL for tasks assessed RLE Sensation: WFL - Light Touch (reports some modest  numbness) Left Lower Extremity Assessment LLE ROM/Strength/Tone: WFL for tasks assessed LLE Sensation: WFL - Light Touch Trunk Assessment Trunk Assessment: Other exceptions (scoliotic)   Balance    End of Session PT - End  of Session Equipment Utilized During Treatment: Gait belt Activity Tolerance: Patient limited by pain;Patient limited by fatigue Patient left: in bed;with call bell/phone within reach;with family/visitor present Nurse Communication: Mobility status  GP     Fabio Asa 10/05/2011, 4:32 PM Charlotte Crumb, PT DPT  [phone]

## 2011-10-05 NOTE — Progress Notes (Signed)
Occupational Therapy Evaluation Patient Details Name: [name] MRN: [id number] DOB: [birth year] Today's Date: 10/05/2011 Time: 4098-1191 OT Time Calculation (min): 18 min  OT Assessment / Plan / Recommendation Clinical Impression  31 yo s/p rod removal/placement and L4-5, L5 - S1 fusion. Pt will benefit from skilled OT to max independence with ADL and functional mobility for ADL to facilitate D/C with husband.     OT Assessment  Patient needs continued OT Services    Follow Up Recommendations  No OT follow up    Barriers to Discharge None    Equipment Recommendations  3 in 1 bedside comode;Rolling walker with 5" wheels;Tub/shower bench    Recommendations for Other Services    Frequency  Min 3X/week    Precautions / Restrictions Precautions Precautions: Back Precaution Booklet Issued: Yes (comment)   Pertinent Vitals/Pain 5    ADL  Eating/Feeding: Simulated;Independent Where Assessed - Eating/Feeding: Chair Grooming: Performed;Modified independent Where Assessed - Grooming: Unsupported standing Upper Body Bathing: Simulated;Supervision/safety;Set up Where Assessed - Upper Body Bathing: Supported sitting Lower Body Bathing: Simulated;Moderate assistance Where Assessed - Lower Body Bathing: Supported sit to stand Upper Body Dressing: Simulated;Min guard Where Assessed - Upper Body Dressing: Supported sitting Lower Body Dressing: Simulated;Moderate assistance Where Assessed - Lower Body Dressing: Supported sit to Pharmacist, hospital: Performed;Minimal Dentist Method: Sit to Barista: Comfort height toilet Toileting - Clothing Manipulation and Hygiene: Simulated;Minimal assistance Where Assessed - Engineer, mining and Hygiene: Sit to stand from 3-in-1 or toilet Transfers/Ambulation Related to ADLs: S RW level ADL Comments: Will benefit from AE.    OT Diagnosis: Generalized weakness;Acute pain  OT  Problem List: Decreased strength;Decreased activity tolerance;Decreased knowledge of use of DME or AE;Decreased knowledge of precautions;Pain OT Treatment Interventions: Self-care/ADL training;DME and/or AE instruction;Therapeutic activities;Patient/family education   OT Goals Acute Rehab OT Goals OT Goal Formulation: With patient Time For Goal Achievement: 10/12/11 Potential to Achieve Goals: Good ADL Goals Pt Will Perform Lower Body Bathing: with supervision;with caregiver independent in assisting;Sit to stand from chair;with adaptive equipment ADL Goal: Lower Body Bathing - Progress: Goal set today Pt Will Perform Lower Body Dressing: with supervision;Unsupported;with adaptive equipment;Sit to stand from chair ADL Goal: Lower Body Dressing - Progress: Goal set today Pt Will Transfer to Toilet: with modified independence;Ambulation;with DME ADL Goal: Toilet Transfer - Progress: Goal set today Pt Will Perform Toileting - Clothing Manipulation: with modified independence;Standing ADL Goal: Toileting - Clothing Manipulation - Progress: Goal set today Pt Will Perform Toileting - Hygiene: with modified independence;Sit to stand from 3-in-1/toilet;with adaptive equipment;with cueing (comment type and amount) ADL Goal: Toileting - Hygiene - Progress: Goal set today Pt Will Perform Tub/Shower Transfer: with supervision;with caregiver independent in assisting;with DME;Maintaining back safety precautions ADL Goal: Tub/Shower Transfer - Progress: Goal set today  Visit Information  Last OT Received On: 10/05/11 Assistance Needed: +1 PT/OT Co-Evaluation/Treatment: Yes    Subjective Data      Prior Functioning  Vision/Perception  Home Living Lives With: Spouse Available Help at Discharge: Family;Friend(s);Available PRN/intermittently Type of Home: House Home Access: Stairs to enter [employer] of Steps: 2 Entrance Stairs-Rails: None Home Layout: One level Bathroom Shower/Tub:  Sport and exercise psychologist: Standard Bathroom Accessibility: No Home Adaptive Equipment: None Prior Function Level of Independence: Independent Able to Take Stairs?: Yes Communication Communication: No difficulties Dominant Hand: Right      Cognition  Overall Cognitive Status: Appears within functional limits for tasks assessed/performed Arousal/Alertness: Awake/alert Orientation Level: Appears intact for tasks  assessed Behavior During Session: [facility] for tasks performed    Extremity/Trunk Assessment Right Upper Extremity Assessment RUE ROM/Strength/Tone: Within functional levels Left Upper Extremity Assessment LUE ROM/Strength/Tone: Within functional levels Trunk Assessment Trunk Assessment: Other exceptions (scoliotic)   Mobility Bed Mobility Bed Mobility: Sit to Sidelying Left;Sit to Supine Sit to Supine: 3: Mod assist;HOB flat Sit to Sidelying Left: 3: Mod assist Details for Bed Mobility Assistance: vc for back precautions and log rolling Transfers Transfers: Sit to Stand;Stand to Sit Sit to Stand: 5: Supervision;With upper extremity assist;From chair/3-in-1 Stand to Sit: 5: Supervision;With upper extremity assist;To bed   Exercise    Balance    End of Session OT - End of Session Activity Tolerance: Patient limited by pain Patient left: in bed;with call bell/phone within reach;with family/visitor present Nurse Communication: Mobility status  GO     Lailee Hoelzel,HILLARY 10/05/2011, 3:54 PM [facility], OTR/L  [phone] 10/05/2011

## 2011-10-05 NOTE — Progress Notes (Signed)
Seen in 5N32 this evening  She requested Nictine patch this Am and apparently has resumed smoking unbeknownst to me  S: C/o right sided low back pain  O:Semi-sitting in bed  A: tobacco adiction, asthmatic and s/p re-grafting lumbar fusion  P: talked about smoking cessation. Will order Chantix.

## 2011-10-05 NOTE — Progress Notes (Signed)
UR COMPLETED  

## 2011-10-06 MED ORDER — METHYLPREDNISOLONE SODIUM SUCC 40 MG IJ SOLR
40.0000 mg | Freq: Four times a day (QID) | INTRAMUSCULAR | Status: DC
  Administered 2011-10-06 – 2011-10-07 (×2): 40 mg via INTRAVENOUS
  Filled 2011-10-06 (×7): qty 1

## 2011-10-06 NOTE — Progress Notes (Signed)
Orthopedic Tech Progress Note Patient Details:  [name] February 24, 1980 [id number]  Patient ID: [name], female   DOB: [birth year], 31 y.o.   MRN: [id number]   [name] 10/06/2011, 4:09 PM

## 2011-10-06 NOTE — Progress Notes (Signed)
Patient ID: [name]  female  ZOX:[id number]    DOB: 23-Jul-1980    DOA: 10/04/2011  PCP: No primary provider on file.   [facility] MEDICINE  CONSULT FOLLOW-UP  Subjective:  Coughing, no fevers/chills, nausea, abd pain  Objective: Weight change:   Intake/Output Summary (Last 24 hours) at 10/06/11 0906 Last data filed at 10/06/11 0656  Gross per 24 hour  Intake   2363 ml  Output    320 ml  Net   2043 ml   Blood pressure 104/42, pulse 83, temperature 97.8 F (36.6 C), temperature source Oral, resp. rate 18, height 5\' 2"  (1.575 m), weight 81.647 kg (180 lb), last menstrual period 09/25/2011, SpO2 100.00%.  Physical Exam: General: Alert and awake, oriented x3, not in any acute distress. HEENT: anicteric sclera, pupils reactive to light and accommodation, EOMI CVS: S1-S2 clear, no murmur rubs or gallops Chest: somewhat congested, wheezing b/l Abdomen: soft nontender, nondistended, normal bowel sounds, no organomegaly Extremities: no cyanosis, clubbing or edema noted bilaterally   Lab Results: Basic Metabolic Panel:  Lab 10/05/11 8119 10/04/11 0633  NA 134* 137  K 3.7 3.9  CL 100 101  CO2 24 25  GLUCOSE 194* 101*  BUN 4* 9  CREATININE 0.54 0.65  CALCIUM 8.9 9.5  MG -- --  PHOS -- --   Liver Function Tests:  Lab 10/04/11 0633  AST 17  ALT 20  ALKPHOS 57  BILITOT 0.2*  PROT 6.8  ALBUMIN 3.8   CBC:  Lab 10/05/11 0500 10/04/11 0640  WBC 13.1* 8.5  NEUTROABS -- --  HGB 11.7* 14.0  HCT 34.0* 41.0  MCV 94.7 93.8  PLT 236 244     Micro Results: Recent Results (from the past 240 hour(s))  SURGICAL PCR SCREEN     Status: Normal   Collection Time   10/04/11  6:41 AM      Component Value Range Status Comment   MRSA, PCR NEGATIVE  NEGATIVE Final    Staphylococcus aureus NEGATIVE  NEGATIVE Final     Studies/Results: Dg Chest 2 View  10/04/2011  *RADIOLOGY REPORT*  Clinical Data: Preop for lumbar fusion.  Asthma.  Smoker.  Cough.  CHEST - 2 VIEW   Comparison: 11/07/2010  Findings: The heart size and pulmonary vascularity are normal. The lungs appear clear and expanded without focal air space disease or consolidation. No blunting of the costophrenic angles.  No pneumothorax.  Mediastinal contours appear intact.  Postoperative changes in the thoracolumbar spine.  Degenerative changes in the thoracic spine.  IMPRESSION: No evidence of active pulmonary disease.   Original Report Authenticated By: Marlon Pel, M.D.    Dg Lumbar Spine 2-3 Views  10/04/2011  *RADIOLOGY REPORT*  Clinical Data: Lumbar fusion revision.  LUMBAR SPINE - 2-3 VIEW  Comparison: 10/04/2011 CT  Findings: Initial image demonstrates posterior fusion changes from the lower thoracic spine to the sacrum.  The second PA view and cross-table lateral views demonstrate revision of the posterior fusion.  No hardware or bony complicating feature.  IMPRESSION: Interval revision of the posterior fusion.  No complicating feature.   Original Report Authenticated By: Cyndie Chime, M.D.    Ct Lumbar Spine Wo Contrast  10/04/2011  *RADIOLOGY REPORT*  Clinical Data: Preop for lumbar spinal fusion.  Pseudoarthrosis and rod fracture.  CT LUMBAR SPINE WITHOUT CONTRAST,CT THORACIC SPINE WITHOUT CONTRAST  Technique:  Multidetector CT imaging of the lumbar spine was performed without intravenous contrast administration. Multiplanar CT image reconstructions were also generated.,Technique:  Multidetector CT imaging of the thoracic spine was performed without  Comparison: CT myelogram 01/08/2007.  Findings: There are pedicle screws and posterior rods extending from T9-S1.  The rods are fractured at L4.  Interbody ray cage fusion noted at L5-S1.  The pedicle screws appear well-positioned and no obvious pedicle screw fractures or malposition.  There appears to be solid posterior fusion down to L4.  Incomplete posterior fusion/pseudoarthrosis at L4-5.  Solid appearing interbody ray cage fusion at L5-S1  without complicating features.  There is mild bony foraminal encroachment bilaterally.  IMPRESSION:  1.  Fracture of the Harrington rods and L4. 2.  Incomplete posterior fusion/pseudoarthrosis at L4-5. 3.  Solid appearing interbody ray cage fusion at L5-S1. 4.  No spinal or foraminal stenosis is demonstrated except for mild bony foraminal encroachment at L5-S1 bilaterally.   Original Report Authenticated By: P. Loralie Champagne, M.D.    Ct T Spine Ltd Wo Or W/ Cm  10/04/2011  *RADIOLOGY REPORT*  Clinical Data: Preop for lumbar spinal fusion.  Pseudoarthrosis and rod fracture.  CT LUMBAR SPINE WITHOUT CONTRAST,CT THORACIC SPINE WITHOUT CONTRAST  Technique:  Multidetector CT imaging of the lumbar spine was performed without intravenous contrast administration. Multiplanar CT image reconstructions were also generated.,Technique: Multidetector CT imaging of the thoracic spine was performed without  Comparison: CT myelogram 01/08/2007.  Findings: There are pedicle screws and posterior rods extending from T9-S1.  The rods are fractured at L4.  Interbody ray cage fusion noted at L5-S1.  The pedicle screws appear well-positioned and no obvious pedicle screw fractures or malposition.  There appears to be solid posterior fusion down to L4.  Incomplete posterior fusion/pseudoarthrosis at L4-5.  Solid appearing interbody ray cage fusion at L5-S1 without complicating features.  There is mild bony foraminal encroachment bilaterally.  IMPRESSION:  1.  Fracture of the Harrington rods and L4. 2.  Incomplete posterior fusion/pseudoarthrosis at L4-5. 3.  Solid appearing interbody ray cage fusion at L5-S1. 4.  No spinal or foraminal stenosis is demonstrated except for mild bony foraminal encroachment at L5-S1 bilaterally.   Original Report Authenticated By: P. Loralie Champagne, M.D.     Medications: Scheduled Meds:    . docusate sodium  100 mg Oral BID  . Fluticasone-Salmeterol  1 puff Inhalation Q12H  . Fluticasone-Salmeterol   1 puff Inhalation BID  . HYDROmorphone PCA 0.3 mg/mL   Intravenous Q4H  . ipratropium  0.5 mg Nebulization Q6H  . levalbuterol  0.63 mg Nebulization Q6H  . levofloxacin (LEVAQUIN) IV  750 mg Intravenous Q24H  . methylPREDNISolone (SOLU-MEDROL) injection  60 mg Intravenous Q6H  . mupirocin ointment   Nasal BID  . pantoprazole  40 mg Oral Q1200  . senna  1 tablet Oral BID  . sodium chloride  3 mL Intravenous Q12H  . varenicline  0.5 mg Oral Daily   Followed by  . varenicline  0.5 mg Oral BID   Followed by  . varenicline  1 mg Oral BID  . DISCONTD: pantoprazole (PROTONIX) IV  40 mg Intravenous QHS   Continuous Infusions:    . sodium chloride    . dextrose 5 % and 0.45 % NaCl with KCl 20 mEq/L 30 mL/hr at 10/06/11 0600  . DISCONTD: lactated ringers       Assessment/Plan:   Principal Problem:  *Back pain status post lumbar spinal fusion surgery PO day #1 - Management per neurosurgery, primary service   Active Problems:  Acute Asthma exacerbation: Patient has a prior history of noncompliance per  the chart review however the stress, anesthesia and surgery probably precipitated her asthma exacerbation. She may have aspirated during surgery as she sounds somewhat congested today. -  CXR shows bronchitis changes, started her on levaquin, recommend completing the course of antibiotic for atleast 5 days.  leukocytosis likely sec to steroids.  - continue scheduled xopenex and Atrovent nebulizers breathing treatments, advair,   And taper IV Steroids. - Discussed with RT, added flutter valve    [facility] hospitalist service will continue to follow the patient.   DVT Prophylaxis: SCD's  Code Status:  Disposition:per primary service   LOS: 2 days   Kimia Finan M.D. [facility] 10/06/2011, 9:06 AM Pager: [phone]  If 7PM-7AM, please contact night-coverage [email or web address] Password [email or web address]

## 2011-10-06 NOTE — Progress Notes (Signed)
Seen in room 5N32 tonight  S: TLSO was provided today, Was up with PT and went on stairs  O: seems comfortable in bed, drains contain serosanguinous fluid  A: satisfactory  P: drains out, hopefully home tomorrow.

## 2011-10-06 NOTE — Care Management Note (Signed)
    Page 1 of 1   10/06/2011     10:42:14 AM   CARE MANAGEMENT NOTE 10/06/2011  Patient:  [name], [name]   Account Number:  [id number]  Date Initiated:  10/06/2011  Documentation initiated by:  Anette Guarneri  Subjective/Objective Assessment:   POD#2 s/p Post. Lumb. Fusion  lives with SO, on limited income     Action/Plan:   home with self care   Anticipated DC Date:  10/07/2011   Anticipated DC Plan:  HOME/SELF CARE         Choice offered to / List presented to:  C-1 Patient   DME arranged  Levan Hurst      DME agency  [facility]        Status of service:  Completed, signed off Medicare Important Message given?   (If response is "NO", the following Medicare IM given date fields will be blank) Date Medicare IM given:   Date Additional Medicare IM given:    Discharge Disposition:  HOME/SELF CARE  Per UR Regulation:  Reviewed for med. necessity/level of care/duration of stay  If discussed at Long Length of Stay Meetings, dates discussed:    Comments:  10/06/11  10:35  Anette Guarneri RN/CM Spoke with patient regarding d/c needs. PT recommends RW/3n1/tub shower bench Per Ms. Narayanan she has hand rails on each side of toilet and does not need 3n1, would like RW. Since Medicare will not cover tub shower bench and cost is $60 patient declined tub shower bench Contacted [facility] arranged for RW to be delivered to room prioir to discharge Patient also expressed need for new TLSO brace, checked with [employer] and per [employer], medicare will cover brace if ordered while patient in hosp. Left message for Dr. Alveda Reasons requesting order if brace needed. Patient also needs reacher but per patient she can't afford, spoke with [facility], per Irving Burton they have indigent fund and will supply reacher to patient.

## 2011-10-06 NOTE — Progress Notes (Signed)
Referral received for SNF. Chart reviewed and CSW has spoken with RNCM who indicates that patient is for DC to home with Home Health and DME. Patient asked to speak to a Child psychotherapist- when visited- she had questions re: [facility]  Patient also wanted to talk to a financial counselor re: her hospital bills and stated that she had been told on last admission that she would receive financial papers in the mail to fill out. Patient stated she never received papers.  Message left for [name]- Financial Counselor to contact patient regarding financial questions and needs. Patient was very Adult nurse.  No CSW needs identified.   CSW to sign off. Please re-consult if CSW needs arise.  [name]. [name]  [phone]

## 2011-10-06 NOTE — Progress Notes (Signed)
Occupational Therapy Treatment Patient Details Name: [name] MRN: [id number] DOB: Jul 12, 1980 Today's Date: 10/06/2011 Time: 4098-1191 OT Time Calculation (min): 17 min  OT Assessment / Plan / Recommendation Comments on Treatment Session Session focused on AE but limited by pain    Follow Up Recommendations  No OT follow up;Supervision/Assistance - 24 hour    Barriers to Discharge       Equipment Recommendations  3 in 1 bedside comode;Rolling walker with 5" wheels;Tub/shower seat    Recommendations for Other Services    Frequency     Plan Discharge plan remains appropriate    Precautions / Restrictions Precautions Precautions: Back Precaution Booklet Issued: Yes (comment) Restrictions Weight Bearing Restrictions: No   Pertinent Vitals/Pain 9/10 back pain. RN provided medication    ADL  ADL Comments: Pt in 9/10 back pain and declining OB with therapy at this time, but with multiple questions re: AE. Demonstrated/educated pt on AE. Pt able to verbalize understanding, but declines practice. Pt states she donated all of her AE from a previous back sx and does not have money buy AE at this time. Will provide pt with indigent equipment (reacher, shoe horn, sponge and toileting aid). Also, educated pt on shower transfers with plans to perform next session    OT Diagnosis:    OT Problem List:   OT Treatment Interventions:     OT Goals ADL Goals ADL Goal: Lower Body Bathing - Progress: Progressing toward goals ADL Goal: Lower Body Dressing - Progress: Progressing toward goals ADL Goal: Toileting - Hygiene - Progress: Progressing toward goals ADL Goal: Tub/Shower Transfer - Progress: Progressing toward goals  Visit Information  Last OT Received On: 10/06/11 Assistance Needed: +1    Subjective Data      Prior Functioning       Cognition  Overall Cognitive Status: Appears within functional limits for tasks assessed/performed Arousal/Alertness:  Awake/alert Orientation Level: Appears intact for tasks assessed Behavior During Session: [facility] for tasks performed    Mobility Transfers Sit to Stand: 5: Supervision;From chair/3-in-1 Stand to Sit: 5: Supervision;To chair/3-in-1;With armrests   Exercises    Balance    End of Session OT - End of Session Activity Tolerance: Patient limited by pain Patient left: in bed;with call bell/phone within reach Nurse Communication: Mobility status  GO     Nadege Carriger 10/06/2011, 4:06 PM

## 2011-10-06 NOTE — Progress Notes (Signed)
Orthopedic Tech Progress Note Patient Details:  [name] 01-Apr-1980 [id number]  Patient ID: [name], female   DOB: 1980-05-04, 31 y.o.   MRN: [id number] Brace order completed by Storm Frisk, Laikynn Pollio 10/06/2011, 4:00 PM

## 2011-10-06 NOTE — Progress Notes (Signed)
Physical Therapy Treatment Patient Details Name: [name] MRN: [id number] DOB: 1980/11/04 Today's Date: 10/06/2011 Time: 8657-8469 PT Time Calculation (min): 19 min  PT Assessment / Plan / Recommendation Comments on Treatment Session  Pt able to perform stair negotiation with assistance.  Pt had difficulty with foot clearance on stairs and will benefit from continued practice to ensure safety upon discharge.    Follow Up Recommendations  No PT follow up    Barriers to Discharge        Equipment Recommendations  3 in 1 bedside comode;Rolling walker with 5" wheels;Tub/shower bench    Recommendations for Other Services    Frequency Min 5X/week   Plan Discharge plan remains appropriate    Precautions / Restrictions Precautions Precautions: Back Precaution Booklet Issued: Yes (comment) Restrictions Weight Bearing Restrictions: No   Pertinent Vitals/Pain 8/10    Mobility  Transfers Transfers: Sit to Stand;Stand to Sit Sit to Stand: 5: Supervision;From chair/3-in-1 Stand to Sit: 5: Supervision;To chair/3-in-1;With armrests Ambulation/Gait Ambulation/Gait Assistance: 5: Supervision Ambulation Distance (Feet): 300 Feet Assistive device: Other (Comment) (with IV pole) Ambulation/Gait Assistance Details: Patient able to ambulate using IV pole but complains of some tingling in her LE Gait Pattern: Step-to pattern;Antalgic;Decreased stride length Gait velocity: decreased General Gait Details: rigid posture Stairs: Yes Stairs Assistance: 4: Min assist Stairs Assistance Details (indicate cue type and reason): hand held assist Stair Management Technique: No rails;Forwards;Step to pattern Number of Stairs: 4       PT Goals Acute Rehab PT Goals PT Goal Formulation: With patient Pt will Ambulate: >150 feet;with modified independence PT Goal: Ambulate - Progress: Progressing toward goal Pt will Go Up / Down Stairs: 3-5 stairs;with modified independence PT Goal:  Up/Down Stairs - Progress: Progressing toward goal  Visit Information  Last PT Received On: 10/06/11 Assistance Needed: +1    Subjective Data  Subjective: I'm in so much pain Patient Stated Goal: to go home   Cognition  Overall Cognitive Status: Appears within functional limits for tasks assessed/performed Arousal/Alertness: Awake/alert Orientation Level: Appears intact for tasks assessed Behavior During Session: [facility] for tasks performed    Balance     End of Session PT - End of Session Equipment Utilized During Treatment: Gait belt Activity Tolerance: Patient limited by pain;Patient limited by fatigue Patient left: in bed;with call bell/phone within reach;with family/visitor present Nurse Communication: Mobility status   GP     Fabio Asa 10/06/2011, 3:14 PM Charlotte Crumb, PT DPT  [phone]

## 2011-10-07 ENCOUNTER — Inpatient Hospital Stay (HOSPITAL_COMMUNITY): Payer: Medicare Other

## 2011-10-07 MED ORDER — VARENICLINE TARTRATE 0.5 MG PO TABS: 0.5000 mg | ORAL_TABLET | Freq: Two times a day (BID) | ORAL | Status: AC

## 2011-10-07 MED ORDER — LORAZEPAM 2 MG/ML IJ SOLN: 1.0000 mg | INTRAMUSCULAR | Status: DC | PRN

## 2011-10-07 MED ORDER — DM-GUAIFENESIN ER 30-600 MG PO TB12
1.0000 | ORAL_TABLET | Freq: Two times a day (BID) | ORAL | Status: DC
  Administered 2011-10-07 – 2011-10-08 (×3): 1 via ORAL
  Filled 2011-10-07 (×5): qty 1

## 2011-10-07 MED ORDER — LORAZEPAM 2 MG/ML IJ SOLN
INTRAMUSCULAR | Status: AC
  Administered 2011-10-07: 1 mg
  Filled 2011-10-07: qty 1

## 2011-10-07 MED ORDER — HYDROCODONE-ACETAMINOPHEN 10-325 MG PO TABS: 1.0000 | ORAL_TABLET | ORAL | Status: DC | PRN

## 2011-10-07 MED ORDER — FLUTICASONE-SALMETEROL 500-50 MCG/DOSE IN AEPB: 1.0000 | INHALATION_SPRAY | Freq: Two times a day (BID) | RESPIRATORY_TRACT | Status: DC

## 2011-10-07 MED ORDER — ALBUTEROL SULFATE HFA 108 (90 BASE) MCG/ACT IN AERS: 1.0000 | INHALATION_SPRAY | Freq: Four times a day (QID) | RESPIRATORY_TRACT | Status: DC | PRN

## 2011-10-07 MED ORDER — VARENICLINE TARTRATE 0.5 MG PO TABS: 0.5000 mg | ORAL_TABLET | Freq: Every day | ORAL | Status: DC

## 2011-10-07 MED ORDER — METHYLPREDNISOLONE SODIUM SUCC 125 MG IJ SOLR
60.0000 mg | Freq: Four times a day (QID) | INTRAMUSCULAR | Status: DC
  Administered 2011-10-07 – 2011-10-08 (×4): 60 mg via INTRAVENOUS
  Filled 2011-10-07 (×2): qty 0.96
  Filled 2011-10-07 (×3): qty 2
  Filled 2011-10-07 (×3): qty 0.96

## 2011-10-07 MED ORDER — LEVALBUTEROL HCL 0.63 MG/3ML IN NEBU
0.6300 mg | INHALATION_SOLUTION | RESPIRATORY_TRACT | Status: DC
  Administered 2011-10-07 – 2011-10-08 (×4): 0.63 mg via RESPIRATORY_TRACT
  Filled 2011-10-07 (×13): qty 3

## 2011-10-07 MED ORDER — ALBUTEROL SULFATE HFA 108 (90 BASE) MCG/ACT IN AERS: 2.0000 | INHALATION_SPRAY | Freq: Four times a day (QID) | RESPIRATORY_TRACT | Status: DC | PRN

## 2011-10-07 NOTE — Progress Notes (Addendum)
Called by RT to see pt with c/o SOB and wheezing.  On arrival pt sitting on the side of the bed with Rt & primary RN present.  Pt's Sats 100% on 2L but in distress RR 35-40 with very limited air movement & coughing up multiple plugs.  Xopenex nebs given as well as IV Ativan & pt relaxed & began to have better air movement.  HR 15-120 BP 95\58 after PCA & IV ativan.  NP with [facility] paged & awaiting return call.  Dr. Alveda Reasons updated & gave order for IV Ativan.  Pt breathing much easier, maintaining Sats, & more relaxed.  Spoke with NP Schorr with [facility].

## 2011-10-07 NOTE — Progress Notes (Signed)
PT Cancellation Note  Treatment cancelled today due to patient's refusal to participate, states she had a massive asthma attack lasting over an hour and is feeling too weak and in too much pain to do anything today.  Fabio Asa 10/07/2011, 10:56 AM Charlotte Crumb, PT DPT  [phone]

## 2011-10-07 NOTE — Progress Notes (Signed)
Patient ID: [name]  female  ZOX:[id number]    DOB: [birth year]    DOA: 10/04/2011  PCP: No primary provider on file.   [facility] MEDICINE  CONSULT FOLLOW-UP  Subjective:  RR this morning, for severe coughing spells, leading to bronchospasm,and diffuse bilateral wheezes, with good oxygen saturations. She was given xopenex nebs, and is resting comfortably.   Objective: Weight change:   Intake/Output Summary (Last 24 hours) at 10/07/11 0859 Last data filed at 10/07/11 0000  Gross per 24 hour  Intake    403 ml  Output     45 ml  Net    358 ml   Blood pressure 104/43, pulse 120, temperature 98.2 F (36.8 C), temperature source Oral, resp. rate 18, height 5\' 2"  (1.575 m), weight 81.647 kg (180 lb), last menstrual period 09/25/2011, SpO2 99.00%.  Physical Exam: General: Alert and awake, oriented x3, not in any acute distress. HEENT: anicteric sclera, pupils reactive to light and accommodation, EOMI CVS: S1-S2 clear, no murmur rubs or gallops Chest: somewhat congested, wheezing insp and esp bialteral upper lobes. Abdomen: soft nontender, nondistended, normal bowel sounds, no organomegaly Extremities: no cyanosis, clubbing or edema noted bilaterally   Lab Results: Basic Metabolic Panel:  Lab 10/05/11 8119 10/04/11 0633  NA 134* 137  K 3.7 3.9  CL 100 101  CO2 24 25  GLUCOSE 194* 101*  BUN 4* 9  CREATININE 0.54 0.65  CALCIUM 8.9 9.5  MG -- --  PHOS -- --   Liver Function Tests:  Lab 10/04/11 0633  AST 17  ALT 20  ALKPHOS 57  BILITOT 0.2*  PROT 6.8  ALBUMIN 3.8   CBC:  Lab 10/05/11 0500 10/04/11 0640  WBC 13.1* 8.5  NEUTROABS -- --  HGB 11.7* 14.0  HCT 34.0* 41.0  MCV 94.7 93.8  PLT 236 244     Micro Results: Recent Results (from the past 240 hour(s))  SURGICAL PCR SCREEN     Status: Normal   Collection Time   10/04/11  6:41 AM      Component Value Range Status Comment   MRSA, PCR NEGATIVE  NEGATIVE Final    Staphylococcus aureus NEGATIVE  NEGATIVE  Final     Studies/Results: Dg Chest 2 View  10/04/2011  *RADIOLOGY REPORT*  Clinical Data: Preop for lumbar fusion.  Asthma.  Smoker.  Cough.  CHEST - 2 VIEW  Comparison: 11/07/2010  Findings: The heart size and pulmonary vascularity are normal. The lungs appear clear and expanded without focal air space disease or consolidation. No blunting of the costophrenic angles.  No pneumothorax.  Mediastinal contours appear intact.  Postoperative changes in the thoracolumbar spine.  Degenerative changes in the thoracic spine.  IMPRESSION: No evidence of active pulmonary disease.   Original Report Authenticated By: Marlon Pel, M.D.    Dg Lumbar Spine 2-3 Views  10/04/2011  *RADIOLOGY REPORT*  Clinical Data: Lumbar fusion revision.  LUMBAR SPINE - 2-3 VIEW  Comparison: 10/04/2011 CT  Findings: Initial image demonstrates posterior fusion changes from the lower thoracic spine to the sacrum.  The second PA view and cross-table lateral views demonstrate revision of the posterior fusion.  No hardware or bony complicating feature.  IMPRESSION: Interval revision of the posterior fusion.  No complicating feature.   Original Report Authenticated By: Cyndie Chime, M.D.    Ct Lumbar Spine Wo Contrast  10/04/2011  *RADIOLOGY REPORT*  Clinical Data: Preop for lumbar spinal fusion.  Pseudoarthrosis and rod fracture.  CT LUMBAR SPINE  WITHOUT CONTRAST,CT THORACIC SPINE WITHOUT CONTRAST  Technique:  Multidetector CT imaging of the lumbar spine was performed without intravenous contrast administration. Multiplanar CT image reconstructions were also generated.,Technique: Multidetector CT imaging of the thoracic spine was performed without  Comparison: CT myelogram 01/08/2007.  Findings: There are pedicle screws and posterior rods extending from T9-S1.  The rods are fractured at L4.  Interbody ray cage fusion noted at L5-S1.  The pedicle screws appear well-positioned and no obvious pedicle screw fractures or malposition.   There appears to be solid posterior fusion down to L4.  Incomplete posterior fusion/pseudoarthrosis at L4-5.  Solid appearing interbody ray cage fusion at L5-S1 without complicating features.  There is mild bony foraminal encroachment bilaterally.  IMPRESSION:  1.  Fracture of the Harrington rods and L4. 2.  Incomplete posterior fusion/pseudoarthrosis at L4-5. 3.  Solid appearing interbody ray cage fusion at L5-S1. 4.  No spinal or foraminal stenosis is demonstrated except for mild bony foraminal encroachment at L5-S1 bilaterally.   Original Report Authenticated By: P. Loralie Champagne, M.D.    Ct T Spine Ltd Wo Or W/ Cm  10/04/2011  *RADIOLOGY REPORT*  Clinical Data: Preop for lumbar spinal fusion.  Pseudoarthrosis and rod fracture.  CT LUMBAR SPINE WITHOUT CONTRAST,CT THORACIC SPINE WITHOUT CONTRAST  Technique:  Multidetector CT imaging of the lumbar spine was performed without intravenous contrast administration. Multiplanar CT image reconstructions were also generated.,Technique: Multidetector CT imaging of the thoracic spine was performed without  Comparison: CT myelogram 01/08/2007.  Findings: There are pedicle screws and posterior rods extending from T9-S1.  The rods are fractured at L4.  Interbody ray cage fusion noted at L5-S1.  The pedicle screws appear well-positioned and no obvious pedicle screw fractures or malposition.  There appears to be solid posterior fusion down to L4.  Incomplete posterior fusion/pseudoarthrosis at L4-5.  Solid appearing interbody ray cage fusion at L5-S1 without complicating features.  There is mild bony foraminal encroachment bilaterally.  IMPRESSION:  1.  Fracture of the Harrington rods and L4. 2.  Incomplete posterior fusion/pseudoarthrosis at L4-5. 3.  Solid appearing interbody ray cage fusion at L5-S1. 4.  No spinal or foraminal stenosis is demonstrated except for mild bony foraminal encroachment at L5-S1 bilaterally.   Original Report Authenticated By: P. Loralie Champagne,  M.D.     Medications: Scheduled Meds:    . dextromethorphan-guaiFENesin  1 tablet Oral BID  . docusate sodium  100 mg Oral BID  . Fluticasone-Salmeterol  1 puff Inhalation Q12H  . Fluticasone-Salmeterol  1 puff Inhalation BID  . HYDROmorphone PCA 0.3 mg/mL   Intravenous Q4H  . ipratropium  0.5 mg Nebulization Q6H  . levalbuterol  0.63 mg Nebulization Q4H  . levofloxacin (LEVAQUIN) IV  750 mg Intravenous Q24H  . LORazepam      . methylPREDNISolone (SOLU-MEDROL) injection  60 mg Intravenous Q6H  . mupirocin ointment   Nasal BID  . pantoprazole  40 mg Oral Q1200  . senna  1 tablet Oral BID  . sodium chloride  3 mL Intravenous Q12H  . varenicline  0.5 mg Oral Daily   Followed by  . varenicline  0.5 mg Oral BID   Followed by  . varenicline  1 mg Oral BID  . DISCONTD: levalbuterol  0.63 mg Nebulization Q6H  . DISCONTD: methylPREDNISolone (SOLU-MEDROL) injection  60 mg Intravenous Q6H  . DISCONTD: methylPREDNISolone (SOLU-MEDROL) injection  40 mg Intravenous Q6H   Continuous Infusions:    . sodium chloride    . dextrose 5 %  and 0.45 % NaCl with KCl 20 mEq/L 30 mL/hr at 10/06/11 0600  . DISCONTD: lactated ringers       Assessment/Plan:   Principal Problem:  *Back pain status post lumbar spinal fusion surgery PO day #3 - Management per neurosurgery, primary service   Active Problems:  Acute Asthma exacerbation: Patient has a prior history of noncompliance per the chart review however the stress, anesthesia and surgery probably precipitated her asthma exacerbation. She may have aspirated during surgery as she sounds somewhat congested. - Repeat CXR shows bronchitis changes, started her on levaquin, recommend completing the course of antibiotic for atleast 5 days.  leukocytosis likely sec to steroids.  - continue scheduled xopenex and Atrovent nebulizers breathing treatments, advair, . Since she had worsening of the bronchospasm with cough , ihave increased her steroids back to  60 mg Q6hrs.  Continue to monitor.  - Discussed with RT, added flutter valve    [facility] hospitalist service will continue to follow the patient.   DVT Prophylaxis: SCD's  Code Status:  Disposition:per primary service   LOS: 3 days   Renee Erb M.D. [facility] 10/07/2011, 8:59 AM Pager: [phone]  If 7PM-7AM, please contact night-coverage [email or web address] Password [email or web address]

## 2011-10-07 NOTE — Progress Notes (Signed)
Pt started having severe coughing and difficulty breathing. Resp. tx called. Rapid response nurse called. Pt coughing up 7 to 8 mucous plugs. Sats in high 90's throughout episode. Pulse up to 140. Pt not moving air throughout lower lungs. Wheezes audible. xopenex treatment started. Dr. Alveda Reasons notified. Ordered mucinex, ativan and requested hospitalist evaluate pt.   Pt improved after ativan 1 mg. In bed. Respirations 18, Sat 98%, pulse 117, BP 98/40 Waiting for hospitalist to evaluate pt. Wheezing present throughout lungs.

## 2011-10-07 NOTE — Progress Notes (Signed)
Occupational Therapy Treatment Patient Details Name: [name] MRN: [id number] DOB: April 04, 1980 Today's Date: 10/07/2011 Time: 4098-1191 OT Time Calculation (min): 34 min  OT Assessment / Plan / Recommendation Comments on Treatment Session Session focused on shower transfer and stair training    Follow Up Recommendations  No OT follow up;Supervision/Assistance - 24 hour    Barriers to Discharge       Equipment Recommendations  3 in 1 bedside comode;Rolling walker with 5" wheels;Tub/shower seat    Recommendations for Other Services    Frequency     Plan Discharge plan remains appropriate    Precautions / Restrictions Precautions Precautions: Back Precaution Comments: Pt able to verbalize all 3 back precautions.  Required reinforcement cues with activity to adhere to precautions Required Braces or Orthoses: Spinal Brace Spinal Brace: Lumbar corset Restrictions Weight Bearing Restrictions: No   Pertinent Vitals/Pain Pt with no c/o pain during session    ADL  Tub/Shower Transfer: Simulated;Minimal assistance Tub/Shower Transfer Method: Ambulating (backward entry into shower with RW; also perfomed HHA) Tub/Shower Transfer Equipment: Walk in shower;Shower seat with back Equipment Used: Back brace;Rolling walker Transfers/Ambulation Related to ADLs: Supervision RW level ADL Comments: Pt with questions re: stairs. Pt educated on how to ascend and descend stairs with use of cane (PT in room supervising). Indigent supplies delieved to room    OT Diagnosis:    OT Problem List:   OT Treatment Interventions:     OT Goals ADL Goals ADL Goal: Toilet Transfer - Progress: Progressing toward goals ADL Goal: Tub/Shower Transfer - Progress: Progressing toward goals  Visit Information  Last OT Received On: 10/07/11 Assistance Needed: +1    Subjective Data      Prior Functioning       Cognition  Overall Cognitive Status: Appears within functional limits for tasks  assessed/performed Arousal/Alertness: Awake/alert Orientation Level: Appears intact for tasks assessed Behavior During Session: [facility] for tasks performed    Mobility Bed Mobility Bed Mobility: Not assessed Left Sidelying to Sit: 5: Supervision;HOB elevated;With rails Sitting - Scoot to Edge of Bed: 5: Supervision Details for Bed Mobility Assistance: Cues to reinforce back precations.  Pt sitting up on EOB before HOB could be flattened.   Transfers Sit to Stand: 5: Supervision;With armrests;From chair/3-in-1 Stand to Sit: 5: Supervision;With armrests;To chair/3-in-1 Details for Transfer Assistance: Cues to reinforce back precautions & technique.    Exercises    Balance    End of Session OT - End of Session Equipment Utilized During Treatment: Gait belt;Back brace Activity Tolerance: Patient tolerated treatment well Patient left: in chair;with call bell/phone within reach;with family/visitor present Nurse Communication: Mobility status  GO     Duvan Mousel 10/07/2011, 4:13 PM

## 2011-10-07 NOTE — Progress Notes (Signed)
Physical Therapy Treatment Patient Details Name: PENI RUPARD MRN: [id number] DOB: [birth year] Today's Date: 10/07/2011 Time: 8469-6295 PT Time Calculation (min): 24 min  PT Assessment / Plan / Recommendation Comments on Treatment Session  Pt required encouragement to participate in session but then agreeable to participate due to needing to use bathroom.      Follow Up Recommendations  No PT follow up    Barriers to Discharge        Equipment Recommendations  3 in 1 bedside comode;Rolling walker with 5" wheels;Tub/shower seat    Recommendations for Other Services    Frequency Min 5X/week   Plan Discharge plan remains appropriate    Precautions / Restrictions Precautions Precautions: Back Precaution Comments: Pt able to verbalize all 3 back precautions.  Required reinforcement cues with activity to adhere to precautions Required Braces or Orthoses: Spinal Brace Restrictions Weight Bearing Restrictions: No       Mobility  Bed Mobility Bed Mobility: Left Sidelying to Sit;Sitting - Scoot to Edge of Bed Left Sidelying to Sit: 5: Supervision;HOB elevated;With rails Sitting - Scoot to Edge of Bed: 5: Supervision Details for Bed Mobility Assistance: Cues to reinforce back precations.  Pt sitting up on EOB before HOB could be flattened.   Transfers Transfers: Sit to Stand;Stand to Sit Sit to Stand: 5: Supervision;With upper extremity assist;From bed;From toilet Stand to Sit: 5: Supervision;With upper extremity assist;With armrests;To chair/3-in-1;To toilet Details for Transfer Assistance: Cues to reinforce back precautions & technique.  Ambulation/Gait Ambulation/Gait Assistance: 5: Supervision Ambulation Distance (Feet): 200 Feet Assistive device: Other (Comment) (pushed IV pole) Ambulation/Gait Assistance Details: Pt deferring use of RW at this time & choosing to hold onto IV for UE support.  1st part of distance pt used bil UE's support & then only unilateral UE support  heading back to room.  Cues to reinforce back precautions, tall posture Gait Pattern: Step-through pattern;Decreased stride length Gait velocity: decreased      PT Goals Acute Rehab PT Goals Time For Goal Achievement: 10/10/11 Potential to Achieve Goals: Good PT Goal: Supine/Side to Sit - Progress: Progressing toward goal PT Goal: Ambulate - Progress: Progressing toward goal  Visit Information  Last PT Received On: 10/07/11 Assistance Needed: +1    Subjective Data  Subjective: "this is my 5th back surgery"   Cognition  Overall Cognitive Status: Appears within functional limits for tasks assessed/performed Arousal/Alertness: Awake/alert Orientation Level: Appears intact for tasks assessed Behavior During Session: [facility] for tasks performed    Balance     End of Session PT - End of Session Equipment Utilized During Treatment: Gait belt;Back brace Activity Tolerance: Patient tolerated treatment well Patient left: in chair;with call bell/phone within reach Nurse Communication: Mobility status     Verdell Face, [place] [phone] 10/07/2011

## 2011-10-07 NOTE — Progress Notes (Signed)
Event: Notified by Rapid Response RN who had been called to eval pt during an apparent asthma attack after significant  coughing episode  . Initially noted w/ inspir/exp wheezing and not moving air well. Did not desaturate. Pt had received Xopenex nebs x 2. Dr Alveda Reasons was also notified and ordered IV Ativan.  NP to bedside. Subjective: Pt noted resting in bed. Reports continued severe back pain. Objective: Continues to cough up significant mucous plugs. Air movement improved w/ persistent bil exp wheezing. BP-98/58 (after Ativan) P-115 (90 prior to episode), R-24 w/ 02 sat of 96-100% on 2L [place]. CXR from 10/05/11 IMPRESSION:  Diffuse interstitial prominence and peribronchial thickening with  borderline heart size. Question interstitial edema versus atypical  (viral/mycoplasma) infection.  Original Report Authenticated By: Cyndie Chime, M.D Pt able to speak in full sentences.  Noted resting w/ eyes closed after dose of IV Dilaudid per PCA pump. Assessment/Plan: 1. Asthma attack s/p extensive coughing. RT to repeat neb at 0700. Notified Dr Blake Divine who has agreed to re-evaluate pt after repeat neb.   Evaluated and examined the patient, ordered a CXR and increased her solumedrol to 60 mg Q6hrs.   Kathlen Mody, MD [phone]

## 2011-10-07 NOTE — Progress Notes (Signed)
Seen and evaluated in room 5N32  Events of early AM reviewed.  She seems fine now.  Drains are out.  Dressing dry.  Has progressed satisfactorily with PT  Will go home tomorrow, not discharged today secondary to events of early this AM - +++bronchospasm/mucous plugs etc.

## 2011-10-08 LAB — BASIC METABOLIC PANEL
BUN: 10 mg/dL (ref 6–23)
GFR calc non Af Amer: >90 mL/min (ref >90)
Glucose, Bld: 150 mg/dL — ABNORMAL HIGH (ref 70–99)
Potassium: 4.1 mEq/L (ref 3.5–5.1)

## 2011-10-08 LAB — CBC
HCT: 31.2 % — ABNORMAL LOW (ref 36.0–46.0)
Hemoglobin: 10.2 g/dL — ABNORMAL LOW (ref 12.0–15.0)
MCHC: 32.7 g/dL (ref 30.0–36.0)

## 2011-10-08 MED ORDER — LEVALBUTEROL HCL 0.63 MG/3ML IN NEBU: 0.6300 mg | INHALATION_SOLUTION | Freq: Four times a day (QID) | RESPIRATORY_TRACT | Status: DC | PRN

## 2011-10-08 MED ORDER — IPRATROPIUM BROMIDE 0.02 % IN SOLN: 0.5000 mg | Freq: Three times a day (TID) | RESPIRATORY_TRACT | Status: DC

## 2011-10-08 MED ORDER — BUDESONIDE-FORMOTEROL FUMARATE 160-4.5 MCG/ACT IN AERO: 2.0000 | INHALATION_SPRAY | Freq: Two times a day (BID) | RESPIRATORY_TRACT | Status: DC

## 2011-10-08 MED ORDER — HYDROCODONE-ACETAMINOPHEN 10-325 MG PO TABS: 1.0000 | ORAL_TABLET | ORAL | Status: AC | PRN

## 2011-10-08 MED ORDER — IPRATROPIUM BROMIDE 0.02 % IN SOLN: 0.5000 mg | Freq: Four times a day (QID) | RESPIRATORY_TRACT | Status: DC | PRN

## 2011-10-08 MED ORDER — LEVALBUTEROL HCL 0.63 MG/3ML IN NEBU
0.6300 mg | INHALATION_SOLUTION | Freq: Three times a day (TID) | RESPIRATORY_TRACT | Status: DC
  Filled 2011-10-08 (×3): qty 3

## 2011-10-08 MED ORDER — PREDNISONE (PAK) 10 MG PO TABS: ORAL_TABLET | ORAL | Status: DC

## 2011-10-08 NOTE — Progress Notes (Signed)
RT completed protocol assessment of patient.  Patient states that she has a history of asthma and takes Advair twice daily at home.  She also uses Albuterol PRN at home. Based on score of assessment, RT is changing treatments to xopenex and atrovent Three times daily.

## 2011-10-08 NOTE — Progress Notes (Signed)
Discharge instructions and Prescriptions reviewed with patient.  Patient requested prescriptions to get faxed to local pharmacy.  Pharmacy called and stated they had received the fax.  Patient verbalized understanding regarding discharge instructions.  Back brace, walker, and all belongings were taken with patient.  Patient discussed using home chair to shower.  Patient will follow-up with Dr. Alveda Reasons as instructed and she was instructed to call should any issues arise prior to follow-up appointment.  Rafiq Bucklin N

## 2011-10-08 NOTE — Progress Notes (Signed)
Physical Therapy Treatment Patient Details Name: [name] MRN: [id number] DOB: [birth year] Today's Date: 10/08/2011 Time: 1478-2956 PT Time Calculation (min): 31 min  PT Assessment / Plan / Recommendation Comments on Treatment Session  Pt with improved mobility today.  Pt states she is hoping to d/c today.  No PT follow up needed.    Follow Up Recommendations  No PT follow up    Barriers to Discharge        Equipment Recommendations  3 in 1 bedside comode;Rolling walker with 5" wheels;Tub/shower seat    Recommendations for Other Services    Frequency Min 5X/week   Plan Discharge plan remains appropriate    Precautions / Restrictions Precautions Precautions: Back Precaution Comments: Pt able to verbalize all 3 back precautions.  Adhered to precautions during treatment. Required Braces or Orthoses: Spinal Brace Spinal Brace: Lumbar corset Restrictions Weight Bearing Restrictions: No   Pertinent Vitals/Pain 8/10 back pain-used PCA twice during 30 minute treatment    Mobility  Bed Mobility Bed Mobility: Not assessed Transfers Transfers: Sit to Stand;Stand to Sit Sit to Stand: 6: Modified independent (Device/Increase time);With upper extremity assist;From bed;From chair/3-in-1;From toilet Stand to Sit: 6: Modified independent (Device/Increase time);With upper extremity assist;With armrests;To chair/3-in-1;To toilet Ambulation/Gait Ambulation/Gait Assistance: 5: Supervision Ambulation Distance (Feet): 200 Feet Assistive device: Rolling walker;Other (Comment) (versus IV pole) Ambulation/Gait Assistance Details: Pt appears more balanced with RW but prefers IV pole.  Pt states she has SPC at home if needed.  Gait steady with RW. Gait Pattern: Step-through pattern;Decreased stride length Gait velocity: decreased General Gait Details: rigid posture Stairs: Yes Stairs Assistance: 5: Supervision Stair Management Technique: No rails;Forwards;Step to pattern Number of  Stairs: 6      PT Goals Acute Rehab PT Goals Time For Goal Achievement: 10/10/11 Potential to Achieve Goals: Good PT Goal: Ambulate - Progress: Progressing toward goal PT Goal: Up/Down Stairs - Progress: Progressing toward goal  Visit Information  Last PT Received On: 10/08/11 Assistance Needed: +1    Subjective Data  Subjective: "This is my 5th back surgery."   Cognition  Overall Cognitive Status: Appears within functional limits for tasks assessed/performed Arousal/Alertness: Awake/alert Orientation Level: Appears intact for tasks assessed Behavior During Session: [facility] for tasks performed        End of Session PT - End of Session Equipment Utilized During Treatment: Back brace Activity Tolerance: Patient tolerated treatment well Patient left: in chair;with call bell/phone within reach;with family/visitor present Nurse Communication: Mobility status        Newell Coral 10/08/2011, 10:50 AM  Newell Coral, PTA [phone]

## 2011-10-08 NOTE — Progress Notes (Signed)
[facility] (OTS)  Subjective: 4 Days Post-Op Procedure(s) (LRB): FUSION POSTERIOR SPINAL MULTILEVEL (Bilateral)  Doing better  No new issues Breathing ok Pt unable to afford advair as it is not cover, symbicort is covered Discussed with pt the importance of establishing care with pcp for long term management of asthma/copd  Objective: Current Vitals Blood pressure 119/62, pulse 78, temperature 97.6 F (36.4 C), temperature source Oral, resp. rate 16, height 5\' 2"  (1.575 m), weight 81.647 kg (180 lb), last menstrual period 09/25/2011, SpO2 98.00%. Vital signs in last 24 hours: Temp:  [97.6 F (36.4 C)-98.2 F (36.8 C)] 97.6 F (36.4 C) (08/24 0552) Pulse Rate:  [78-93] 78  (08/24 0552) Resp:  [16-20] 16  (08/24 0800) BP: (103-129)/(54-68) 119/62 mmHg (08/24 0552) SpO2:  [95 %-99 %] 98 % (08/24 0824)  Intake/Output from previous day: 08/23 0701 - 08/24 0700 In: 1720 [P.O.:1360; I.V.:360] Out: -   LABS  Basename 10/08/11 0505  HGB 10.2*    Basename 10/08/11 0505  WBC 13.8*  RBC 3.25*  HCT 31.2*  PLT 254    Basename 10/08/11 0505  NA 139  K 4.1  CL 102  CO2 26  BUN 10  CREATININE 0.58  GLUCOSE 150*  CALCIUM 9.3   No results found for this basename: LABPT:2,INR:2 in the last 72 hours    Physical Exam  ZOX:WRUEAVW comfortably in chair Lungs:Exp Wheezes B, sating well Cardiac:reg Abd: NT, +BS Ext:   Lower Extremities   TEDs in place   Swelling controlled   EHL, FHL, AT, PT, peroneals, gastroc motor intact, quad and hamstring motor intact B   + DP pulses B    Imaging Dg Chest 2 View  10/07/2011  *RADIOLOGY REPORT*  Clinical Data: Shortness of breath.  CHEST - 2 VIEW  Comparison: 10/05/2011  Findings: Mild peribronchial thickening.  Improving interstitial prominence, likely improving interstitial edema.  Heart is upper limits normal in size.  No confluent opacities or effusions. Postoperative changes in the lower thoracic and  visualized upper lumbar spine.  IMPRESSION: Improving interstitial prominence.  Continued peribronchial thickening.   Original Report Authenticated By: Cyndie Chime, M.D.     Assessment/Plan: 4 Days Post-Op Procedure(s) (LRB): FUSION POSTERIOR SPINAL MULTILEVEL (Bilateral)  31 y/o female s/p repair pseudoarthrosis L3-4, L4-5 posterior fusion, revision pedicle screw and rod fixation L3-4, L4-5, L5-S1, displacing a bilateral rod distally to proximally.  1. L4-5 pseudoarthrosis, possible L3-4 pseudarthrosis status post thoracolumbar fusion for scoliosis, broken rods  Pts d/c delayed yesterday due to acute asthma exacerbation  Better today and ready to leave  Wearing brace, continue with TLSO 2. Asthma/COPD  Pt needs PCP for follow up   Stable for time being  Pt on levaquin per IM, finish course  Changed rx for advair to symbicort due to cost, pt was on advair 500/50 1 puff bid, this converts to symbicort 160/4.5 2 puffs bid 3. Dispo  D/c home today   Mearl Latin, PA-C [facility] [phone] (P) 10/08/2011, 10:47 AM

## 2011-10-10 ENCOUNTER — Encounter (HOSPITAL_COMMUNITY): Payer: Self-pay

## 2011-10-10 NOTE — Progress Notes (Signed)
Patient is discharged on 10/08/11 with prednisone taper and nebs for her asthma .  Kathlen Mody, MD [phone]

## 2011-10-12 NOTE — Discharge Summary (Signed)
NAMENIKI, PAYMENT          ACCOUNT NO.:  [id number]  MEDICAL RECORD NO.:  [id number]  LOCATION:                                 FACILITY:  PHYSICIAN:  Nelda Severe, MD      DATE OF BIRTH:  [birth year]  DATE OF ADMISSION:  10/03/2011 DATE OF DISCHARGE:  10/08/2011                              DISCHARGE SUMMARY   DISCHARGE DIAGNOSES:  Status post thoracolumbar fusion with L4-5 and possible L3-4 pseudoarthrosis.  She was taken to the operating room the day of admission where posterior revision of fusion and fixation was carried out at the L4-5 level. Postoperatively, her course was without event, except for an event of bronchospasm, severe coughing, and induction of asthma associated with retained mucus plugs.  Her discharge was delayed for 1 day because of this till October 08, 2011.  She will be followed in the office in approximately 3 weeks.  Discharge medications and prescriptions include Norco, Chantix, and albuterol inhaler.  She will also be prescribed Robaxin.  Her incision is dry.  Drains had been removed.  She is stable with physical therapy for ambulation including stairs. She is discharged to the care of her husband.     Nelda Severe, MD     MT/MEDQ  D:  10/07/2011  T:  10/08/2011  Job:  [id number]

## 2011-12-14 ENCOUNTER — Encounter (HOSPITAL_COMMUNITY): Payer: Self-pay | Admitting: Pharmacy Technician

## 2011-12-16 ENCOUNTER — Encounter (HOSPITAL_COMMUNITY)
Admission: RE | Admit: 2011-12-16 | Discharge: 2011-12-16 | Payer: Medicare Other | Source: Ambulatory Visit | Attending: Orthopedic Surgery | Admitting: Orthopedic Surgery

## 2011-12-16 NOTE — Pre-Procedure Instructions (Signed)
20 Ary J Gotts  12/16/2011   Your procedure is scheduled on:  Tuesday, November 5th  Report to [facility] at 0530 AM.  Call this number if you have problems the morning of surgery: [phone]   Remember:   Do not eat food or drink:After Midnight.   Take these medicines the morning of surgery with A SIP OF WATER: inhalers, valium if needed, hydrocodone if needed   Do not wear jewelry, make-up or nail polish.  Do not wear lotions, powders, or perfumes.   Do not shave 48 hours prior to surgery. Men may shave face and neck.  Do not bring valuables to the hospital.  Contacts, dentures or bridgework may not be worn into surgery.  Leave suitcase in the car. After surgery it may be brought to your room.  For patients admitted to the hospital, checkout time is 11:00 AM the day of discharge.   Patients discharged the day of surgery will not be allowed to drive home.   Special Instructions: Shower using CHG 2 nights before surgery and the night before surgery.  If you shower the day of surgery use CHG.  Use special wash - you have one bottle of CHG for all showers.  You should use approximately 1/3 of the bottle for each shower.   Please read over the following fact sheets that you were given: Pain Booklet, Coughing and Deep Breathing, Blood Transfusion Information, MRSA Information and Surgical Site Infection Prevention

## 2011-12-16 NOTE — Progress Notes (Signed)
Dr. Toni Arthurs office notified regarding need for orders for surgery.

## 2011-12-20 ENCOUNTER — Inpatient Hospital Stay (HOSPITAL_COMMUNITY): Admission: RE | Admit: 2011-12-20 | Payer: Medicare Other | Source: Ambulatory Visit | Admitting: Orthopedic Surgery

## 2011-12-20 ENCOUNTER — Encounter: Payer: Self-pay | Admitting: Vascular Surgery

## 2011-12-20 ENCOUNTER — Encounter (HOSPITAL_COMMUNITY): Admission: RE | Payer: Self-pay | Source: Ambulatory Visit

## 2011-12-20 SURGERY — ANTERIOR LUMBAR FUSION 2 LEVELS
Anesthesia: General

## 2011-12-21 ENCOUNTER — Encounter: Payer: Medicare Other | Admitting: Vascular Surgery

## 2011-12-27 ENCOUNTER — Encounter: Payer: Self-pay | Admitting: Vascular Surgery

## 2011-12-28 ENCOUNTER — Encounter: Payer: Self-pay | Admitting: Vascular Surgery

## 2011-12-28 ENCOUNTER — Ambulatory Visit (INDEPENDENT_AMBULATORY_CARE_PROVIDER_SITE_OTHER): Payer: Medicare Other | Admitting: Vascular Surgery

## 2011-12-28 VITALS — BP 113/55 | HR 88 | Ht 62.0 in | Wt 179.5 lb

## 2011-12-28 DIAGNOSIS — IMO0002 Reserved for concepts with insufficient information to code with codable children: Secondary | ICD-10-CM

## 2011-12-28 NOTE — Progress Notes (Signed)
[facility]  Patient name: [name] MRN: [id number] DOB: 03-14-1980 Sex: female  REASON FOR CONSULT: evaluate for anterior retroperitoneal exposure of L4-L5 and L5-S1. Referred by Dr. Nelda Severe.  HPI: [name] is a 31 y.o. female with a long history of low back pain. She states that she has had 5 prior operations on her back from a posterior approach. Her lower back pain is aggravated by activity and any movement. She has tried physical therapy pain medicine exercise without any significant relief. She is scheduled for anterior lumbar interbody fusion at 2 levels and we were asked to assist with exposure she has had a previous C-section but no other abdominal surgery. She denies claudication or rest pain.  Past Medical History  Diagnosis Date  . Complication of anesthesia   . Difficult intubation     "needs small tube"  . PONV (postoperative nausea and vomiting)   . Family history of anesthesia complication     Nausea  . Asthma   . CHF (congestive heart failure)     "with pregnancy, toxcemia 2006"  not seeing a cardiologist now.  . Shortness of breath     Asthma  . Headache     takes Imitrex- last one 1 week ago  . Cancer     cervical ca  . Arthritis   . Degenerative disk disease   . Respiratory failure     History reviewed. No pertinent family history.  SOCIAL HISTORY: History  Substance Use Topics  . Smoking status: Current Every Day Smoker -- 0.2 packs/day for 1 years    Types: Cigarettes  . Smokeless tobacco: Never Used     Comment: pt states that she is taking chantix  . Alcohol Use: Yes     Comment: seldom rarley    No Known Allergies  Current Outpatient Prescriptions  Medication Sig Dispense Refill  . albuterol (PROVENTIL HFA;VENTOLIN HFA) 108 (90 BASE) MCG/ACT inhaler Inhale 2 puffs into the lungs every 6 (six) hours as needed for wheezing.  1 Inhaler  2  . albuterol (PROVENTIL HFA;VENTOLIN HFA) 108 (90  BASE) MCG/ACT inhaler Inhale 1-2 puffs into the lungs every 6 (six) hours as needed. For shortness of breath  1 Inhaler  1  . budesonide-formoterol (SYMBICORT) 160-4.5 MCG/ACT inhaler Inhale 2 puffs into the lungs 2 (two) times daily.  1 Inhaler  1  . diazepam (VALIUM) 5 MG tablet Take 5-10 mg by mouth every 6 (six) hours as needed. For anxiety      . HYDROcodone-acetaminophen (NORCO) 10-325 MG per tablet Take 2 tablets by mouth every 4 (four) hours as needed. For pain      . SUMAtriptan (IMITREX) 25 MG tablet Take 25 mg by mouth every 2 (two) hours as needed. For migraines      . varenicline (CHANTIX) 0.5 MG tablet Take 1 tablet (0.5 mg total) by mouth 2 (two) times daily.  8 tablet  0  . varenicline (CHANTIX) 1 MG tablet Take 1 mg by mouth 2 (two) times daily.       REVIEW OF SYSTEMS: Arly.Keller ] denotes positive finding; [  ] denotes negative finding  CARDIOVASCULAR:  [ ]  chest pain   Arly.Keller ] chest pressure   [ ]  palpitations   Arly.Keller ] orthopnea   [ ]  dyspnea on exertion   [ ]  claudication   [ ]  rest pain   [ ]  DVT   [ ]  phlebitis PULMONARY:   [ ]   productive cough   Arly.Keller ] asthma   [ ]  wheezing NEUROLOGIC:   Arly.Keller ] weakness  Arly.Keller ] paresthesias  [ ]  aphasia  [ ]  amaurosis  [ ]  dizziness HEMATOLOGIC:   [ ]  bleeding problems   [ ]  clotting disorders MUSCULOSKELETAL:  [ ]  joint pain   [ ]  joint swelling [ ]  leg swelling GASTROINTESTINAL: [ ]   blood in stool  [ ]   hematemesis GENITOURINARY:  [ ]   dysuria  [ ]   hematuria PSYCHIATRIC:  [ ]  history of major depression INTEGUMENTARY:  [ ]  rashes  [ ]  ulcers CONSTITUTIONAL:  [ ]  fever   [ ]  chills  PHYSICAL EXAM: Filed Vitals:   12/28/11 1100  BP: 113/55  Pulse: 88  Height: 5\' 2"  (1.575 m)  Weight: 179 lb 8 oz (81.421 kg)  SpO2: 98%   Body mass index is 32.83 kg/(m^2). GENERAL: The patient is a well-nourished female, in no acute distress. The vital signs are documented above. CARDIOVASCULAR: There is a regular rate and rhythm. I do not detect carotid bruits.  She has palpable femoral and dorsalis pedis pulses. I cannot palpate a left posterior tibial pulse I can't palpate a right posterior tibial pulse. She has no significant lower extremity swelling. PULMONARY: There is good air exchange bilaterally without wheezing or rales. ABDOMEN: Soft and non-tender with normal pitched bowel sounds.  MUSCULOSKELETAL: There are no major deformities or cyanosis. NEUROLOGIC: No focal weakness or paresthesias are detected. SKIN: There are no ulcers or rashes noted. PSYCHIATRIC: The patient has a normal affect.  DATA:  I have reviewed her CT of the lumbar spine from August of this year which shows a fracture of a Harrington rods at L4. There is incomplete posterior fusion with pseudoarthrosis at L4-L5 area  MEDICAL ISSUES: This patient appears to be a reasonable candidate for anterior retroperitoneal exposure of L4-L5 and L5-S1.I have reviewed our role in exposure of the spine in order to allow anterior lumbar interbody fusion at the appropriate levels. We have discussed the potential complications of surgery, including but not limited to, arterial or venous injury, thrombosis, or bleeding. Her risk of significant vascular injury is 3-4%.We have also discussed the potential risks of wound healing problems, the development of a hernia, nerve injury, leg swelling, or other unpredictable medical problems. All the patient's questions were answered and they are agreeable to proceed.  Anavi Branscum S [facility] [place]per: [phone]

## 2012-01-02 ENCOUNTER — Encounter (HOSPITAL_COMMUNITY): Payer: Self-pay | Admitting: Pharmacy Technician

## 2012-01-10 ENCOUNTER — Encounter (HOSPITAL_COMMUNITY): Admission: RE | Admit: 2012-01-10 | Payer: Medicare Other | Source: Ambulatory Visit

## 2012-01-13 ENCOUNTER — Encounter (HOSPITAL_COMMUNITY)
Admission: RE | Admit: 2012-01-13 | Discharge: 2012-01-13 | Disposition: A | Discharge status: Home / Self Care | Payer: Medicare Other | Source: Ambulatory Visit | Attending: Orthopedic Surgery | Admitting: Orthopedic Surgery

## 2012-01-13 ENCOUNTER — Ambulatory Visit (HOSPITAL_COMMUNITY)
Admission: RE | Admit: 2012-01-13 | Discharge: 2012-01-13 | Disposition: A | Discharge status: Home / Self Care | Payer: Medicare Other | Source: Ambulatory Visit | Attending: Orthopedic Surgery | Admitting: Orthopedic Surgery

## 2012-01-13 ENCOUNTER — Encounter (HOSPITAL_COMMUNITY): Payer: Self-pay

## 2012-01-13 DIAGNOSIS — M4120 Other idiopathic scoliosis, site unspecified: Secondary | ICD-10-CM | POA: Insufficient documentation

## 2012-01-13 DIAGNOSIS — Z01818 Encounter for other preprocedural examination: Secondary | ICD-10-CM | POA: Insufficient documentation

## 2012-01-13 DIAGNOSIS — Z01812 Encounter for preprocedural laboratory examination: Secondary | ICD-10-CM | POA: Insufficient documentation

## 2012-01-13 LAB — SURGICAL PCR SCREEN
MRSA, PCR: NEGATIVE
Staphylococcus aureus: NEGATIVE

## 2012-01-13 LAB — HCG, SERUM, QUALITATIVE: Preg, Serum: NEGATIVE

## 2012-01-13 LAB — CBC
HCT: 40.5 % (ref 36.0–46.0)
Hemoglobin: 13.3 g/dL (ref 12.0–15.0)
MCH: 30.6 pg (ref 26.0–34.0)
MCHC: 32.8 g/dL (ref 30.0–36.0)
RDW: 13.2 % (ref 11.5–15.5)

## 2012-01-13 LAB — BASIC METABOLIC PANEL
BUN: 11 mg/dL (ref 6–23)
Calcium: 9.3 mg/dL (ref 8.4–10.5)
Creatinine, Ser: 0.7 mg/dL (ref 0.50–1.10)
GFR calc Af Amer: >90 mL/min (ref >90)
GFR calc non Af Amer: >90 mL/min (ref >90)

## 2012-01-13 NOTE — Pre-Procedure Instructions (Signed)
20 CALEAH TORTORELLI  01/13/2012   Your procedure is scheduled on:  01-17-2012  Report to [facility] at 5:30AM.  Call this number if you have problems the morning of surgery: [phone]   Remember:   Do not eat food or drink:After Midnight.      Take these medicines the morning of surgery with A SIP OF WATER: inhalers as needed,diazepam(valium) as needed,pain medication as needed,chantix,   Do not wear jewelry, make-up or nail polish.  Do not wear lotions, powders, or perfumes.   Do not shave 48 hours prior to surgery.   Do not bring valuables to the hospital.  Contacts, dentures or bridgework may not be worn into surgery.  Leave suitcase in the car. After surgery it may be brought to your room.   For patients admitted to the hospital, checkout time is 11:00 AM the day of discharge.   Patients discharged the day of surgery will not be allowed to drive home.    Special Instructions: Shower using CHG 2 nights before surgery and the night before surgery.  If you shower the day of surgery use CHG.  Use special wash - you have one bottle of CHG for all showers.  You should use approximately 1/3 of the bottle for each shower.   Please read over the following fact sheets that you were given: Pain Booklet, Coughing and Deep Breathing, Blood Transfusion Information, MRSA Information and Surgical Site Infection Prevention

## 2012-01-16 ENCOUNTER — Other Ambulatory Visit: Payer: Self-pay | Admitting: Orthopedic Surgery

## 2012-01-16 MED ORDER — DEXTROSE 5 % IV SOLN
3.0000 g | INTRAVENOUS | Status: AC
  Administered 2012-01-17: 3 g via INTRAVENOUS
  Filled 2012-01-16: qty 3000

## 2012-01-16 NOTE — Progress Notes (Signed)
LEFT MESSAGE  WITH SHERRY AT DR TOOKE'S OFFICE FOR ORDERS AND MESSAGE LEFT  FOR DR. DICKSON (NEED ORDERS). 

## 2012-01-16 NOTE — Consult Note (Signed)
Anesthesia chart review: Patient is a 31 year old female posted for L3-4, L4-5 anterior lumbar fusion by Dr. Alveda Reasons on 01/17/12.  (Anterior exposure by Dr. Edilia Bo.)  She is s/p PLIF on 10/04/11.  Other history includes obesity, smoking, asthma, headaches, CHF/toxemia with pregnancy in 2006, cervical cancer status post cone biopsy, degenerative disc disease. For anesthesia history, she reports postoperative nausea and vomiting and need for a need for a small ETT.  (Anesthesia comments from her surgery in August 2013 included, "Intubation with Mac 3, Grade 1 view and inserted a size 7.0 ETT without any difficulties."  Preoperative labs noted.  Negative serum pregnancy test.  Chest x-ray on 01/13/2012 showed no active disease. Stable postsurgical changes lower thoracic and lumbar spine. Minimal thoracic dextroscoliosis.  EKG on 01/13/2012 showed normal sinus rhythm, possible left atrial enlargement.   Echo on 08/01/07 showed: - Overall left ventricular systolic function was at the lower limits of normal. Left ventricular ejection fraction was estimated to be 55 %. There was no diagnostic evidence of left ventricular regional wall motion abnormalities.  Nuclear stress test on 09/30/2004 showed no evidence of inducible left ventricular myocardial ischemia, mild LV chamber enlargement with LVEF calculated at 57%.  She will be evaluated by her assigned anesthesiologist on the day of surgery to discuss the definitive anesthesia plan.  Anticipate she can proceed in no significant change in her status.  Shonna Chock, PA-C

## 2012-01-17 ENCOUNTER — Inpatient Hospital Stay (HOSPITAL_COMMUNITY)
Admission: RE | Admit: 2012-01-17 | Discharge: 2012-01-20 | DRG: 460 | Disposition: A | Discharge status: Home / Self Care | Payer: Medicare Other | Source: Ambulatory Visit | Attending: Orthopedic Surgery | Admitting: Orthopedic Surgery

## 2012-01-17 ENCOUNTER — Encounter (HOSPITAL_COMMUNITY)
Admission: RE | Disposition: A | Discharge status: Home / Self Care | Payer: Self-pay | Source: Ambulatory Visit | Attending: Orthopedic Surgery

## 2012-01-17 ENCOUNTER — Encounter (HOSPITAL_COMMUNITY): Payer: Self-pay | Admitting: Vascular Surgery

## 2012-01-17 ENCOUNTER — Inpatient Hospital Stay (HOSPITAL_COMMUNITY): Payer: Medicare Other

## 2012-01-17 ENCOUNTER — Encounter (HOSPITAL_COMMUNITY): Payer: Self-pay | Admitting: Surgery

## 2012-01-17 ENCOUNTER — Inpatient Hospital Stay (HOSPITAL_COMMUNITY): Payer: Medicare Other | Admitting: Vascular Surgery

## 2012-01-17 DIAGNOSIS — Z981 Arthrodesis status: Secondary | ICD-10-CM

## 2012-01-17 DIAGNOSIS — IMO0002 Reserved for concepts with insufficient information to code with codable children: Secondary | ICD-10-CM

## 2012-01-17 DIAGNOSIS — J45901 Unspecified asthma with (acute) exacerbation: Secondary | ICD-10-CM

## 2012-01-17 DIAGNOSIS — T84498A Other mechanical complication of other internal orthopedic devices, implants and grafts, initial encounter: Principal | ICD-10-CM | POA: Diagnosis present

## 2012-01-17 DIAGNOSIS — M549 Dorsalgia, unspecified: Secondary | ICD-10-CM

## 2012-01-17 DIAGNOSIS — I509 Heart failure, unspecified: Secondary | ICD-10-CM | POA: Diagnosis present

## 2012-01-17 DIAGNOSIS — J45909 Unspecified asthma, uncomplicated: Secondary | ICD-10-CM | POA: Diagnosis present

## 2012-01-17 DIAGNOSIS — F17200 Nicotine dependence, unspecified, uncomplicated: Secondary | ICD-10-CM | POA: Diagnosis present

## 2012-01-17 DIAGNOSIS — Y831 Surgical operation with implant of artificial internal device as the cause of abnormal reaction of the patient, or of later complication, without mention of misadventure at the time of the procedure: Secondary | ICD-10-CM | POA: Diagnosis present

## 2012-01-17 HISTORY — DX: Other chronic pain: G89.29

## 2012-01-17 HISTORY — DX: Gastro-esophageal reflux disease without esophagitis: K21.9

## 2012-01-17 HISTORY — PX: ANTERIOR LUMBAR FUSION: SHX1170

## 2012-01-17 HISTORY — DX: Dorsalgia, unspecified: M54.9

## 2012-01-17 HISTORY — DX: Pneumonia, unspecified organism: J18.9

## 2012-01-17 HISTORY — DX: Migraine, unspecified, not intractable, without status migrainosus: G43.909

## 2012-01-17 HISTORY — DX: Unspecified chronic bronchitis: J42

## 2012-01-17 HISTORY — DX: Malignant neoplasm of cervix uteri, unspecified: C53.9

## 2012-01-17 HISTORY — DX: Anxiety disorder, unspecified: F41.9

## 2012-01-17 LAB — HEPATIC FUNCTION PANEL
AST: 15 U/L (ref 0–37)
Albumin: 3.8 g/dL (ref 3.5–5.2)
Alkaline Phosphatase: 53 U/L (ref 39–117)
Total Bilirubin: 0.3 mg/dL (ref 0.3–1.2)
Total Protein: 7 g/dL (ref 6.0–8.3)

## 2012-01-17 SURGERY — ANTERIOR LUMBAR FUSION 2 LEVELS
Anesthesia: General | Site: Spine Lumbar | Laterality: Bilateral | Wound class: Clean

## 2012-01-17 MED ORDER — FLEET ENEMA 7-19 GM/118ML RE ENEM: 1.0000 | ENEMA | Freq: Once | RECTAL | Status: AC | PRN

## 2012-01-17 MED ORDER — SODIUM CHLORIDE 0.9 % IJ SOLN: 3.0000 mL | Freq: Two times a day (BID) | INTRAMUSCULAR | Status: DC

## 2012-01-17 MED ORDER — THROMBIN 20000 UNITS EX SOLR
CUTANEOUS | Status: DC | PRN
  Administered 2012-01-17: 09:00:00 via TOPICAL

## 2012-01-17 MED ORDER — HEMOSTATIC AGENTS (NO CHARGE) OPTIME
TOPICAL | Status: DC | PRN
  Administered 2012-01-17 (×2): 1 via TOPICAL

## 2012-01-17 MED ORDER — FLEET ENEMA 7-19 GM/118ML RE ENEM: 1.0000 | ENEMA | Freq: Once | RECTAL | Status: DC | PRN

## 2012-01-17 MED ORDER — DIAZEPAM 5 MG PO TABS
5.0000 mg | ORAL_TABLET | Freq: Four times a day (QID) | ORAL | Status: DC | PRN
  Administered 2012-01-17: 10 mg via ORAL
  Administered 2012-01-18 (×2): 5 mg via ORAL
  Administered 2012-01-18 – 2012-01-20 (×3): 10 mg via ORAL
  Filled 2012-01-17 (×3): qty 2
  Filled 2012-01-17 (×2): qty 1
  Filled 2012-01-17: qty 2

## 2012-01-17 MED ORDER — HYDROMORPHONE BOLUS VIA INFUSION
INTRAVENOUS | Status: DC | PRN
  Administered 2012-01-17 (×2): 0.5 mg via INTRAVENOUS

## 2012-01-17 MED ORDER — FENTANYL CITRATE 0.05 MG/ML IJ SOLN
INTRAMUSCULAR | Status: DC | PRN
  Administered 2012-01-17: 100 ug via INTRAVENOUS
  Administered 2012-01-17 (×4): 50 ug via INTRAVENOUS
  Administered 2012-01-17: 100 ug via INTRAVENOUS
  Administered 2012-01-17 (×4): 50 ug via INTRAVENOUS
  Administered 2012-01-17: 100 ug via INTRAVENOUS
  Administered 2012-01-17: 50 ug via INTRAVENOUS

## 2012-01-17 MED ORDER — PHENOL 1.4 % MT LIQD: 1.0000 | OROMUCOSAL | Status: DC | PRN

## 2012-01-17 MED ORDER — VARENICLINE TARTRATE 1 MG PO TABS
1.0000 mg | ORAL_TABLET | Freq: Two times a day (BID) | ORAL | Status: DC
  Administered 2012-01-17 – 2012-01-20 (×7): 1 mg via ORAL
  Filled 2012-01-17 (×10): qty 1

## 2012-01-17 MED ORDER — SCOPOLAMINE 1 MG/3DAYS TD PT72: 1.0000 | MEDICATED_PATCH | TRANSDERMAL | Status: DC

## 2012-01-17 MED ORDER — METHOCARBAMOL 100 MG/ML IJ SOLN: 500.0000 mg | Freq: Four times a day (QID) | INTRAVENOUS | Status: DC | PRN

## 2012-01-17 MED ORDER — ONDANSETRON HCL 4 MG/2ML IJ SOLN: 4.0000 mg | INTRAMUSCULAR | Status: DC | PRN

## 2012-01-17 MED ORDER — DIPHENHYDRAMINE HCL 12.5 MG/5ML PO ELIX: 12.5000 mg | ORAL_SOLUTION | Freq: Four times a day (QID) | ORAL | Status: DC | PRN

## 2012-01-17 MED ORDER — SCOPOLAMINE 1 MG/3DAYS TD PT72
MEDICATED_PATCH | TRANSDERMAL | Status: DC | PRN
  Administered 2012-01-17: 1 via TRANSDERMAL

## 2012-01-17 MED ORDER — SODIUM CHLORIDE 0.9 % IV SOLN
250.0000 mL | INTRAVENOUS | Status: DC
  Administered 2012-01-19: 250 mL via INTRAVENOUS

## 2012-01-17 MED ORDER — ACETAMINOPHEN 325 MG PO TABS: 650.0000 mg | ORAL_TABLET | ORAL | Status: DC | PRN

## 2012-01-17 MED ORDER — ACETAMINOPHEN 650 MG RE SUPP: 650.0000 mg | RECTAL | Status: DC | PRN

## 2012-01-17 MED ORDER — NEOSTIGMINE METHYLSULFATE 1 MG/ML IJ SOLN
INTRAMUSCULAR | Status: DC | PRN
  Administered 2012-01-17: 5 mg via INTRAVENOUS

## 2012-01-17 MED ORDER — BUDESONIDE-FORMOTEROL FUMARATE 160-4.5 MCG/ACT IN AERO
2.0000 | INHALATION_SPRAY | Freq: Two times a day (BID) | RESPIRATORY_TRACT | Status: DC
  Administered 2012-01-17 – 2012-01-20 (×5): 2 via RESPIRATORY_TRACT
  Filled 2012-01-17: qty 6

## 2012-01-17 MED ORDER — SODIUM CHLORIDE 0.9 % IV SOLN: 75.0000 mL/h | INTRAVENOUS | Status: DC

## 2012-01-17 MED ORDER — KCL IN DEXTROSE-NACL 20-5-0.45 MEQ/L-%-% IV SOLN: INTRAVENOUS | Status: DC

## 2012-01-17 MED ORDER — ROCURONIUM BROMIDE 100 MG/10ML IV SOLN
INTRAVENOUS | Status: DC | PRN
  Administered 2012-01-17: 50 mg via INTRAVENOUS

## 2012-01-17 MED ORDER — ARTIFICIAL TEARS OP OINT
TOPICAL_OINTMENT | OPHTHALMIC | Status: DC | PRN
  Administered 2012-01-17: 1 via OPHTHALMIC

## 2012-01-17 MED ORDER — LIDOCAINE HCL (CARDIAC) 20 MG/ML IV SOLN
INTRAVENOUS | Status: DC | PRN
  Administered 2012-01-17: 60 mg via INTRAVENOUS

## 2012-01-17 MED ORDER — PROPOFOL 10 MG/ML IV BOLUS
INTRAVENOUS | Status: DC | PRN
  Administered 2012-01-17: 180 mg via INTRAVENOUS

## 2012-01-17 MED ORDER — VECURONIUM BROMIDE 10 MG IV SOLR
INTRAVENOUS | Status: DC | PRN
  Administered 2012-01-17: 1 mg via INTRAVENOUS
  Administered 2012-01-17 (×2): 2 mg via INTRAVENOUS
  Administered 2012-01-17: 1 mg via INTRAVENOUS
  Administered 2012-01-17: 2 mg via INTRAVENOUS
  Administered 2012-01-17 (×2): 1 mg via INTRAVENOUS

## 2012-01-17 MED ORDER — SODIUM CHLORIDE 0.9 % IJ SOLN: 9.0000 mL | INTRAMUSCULAR | Status: DC | PRN

## 2012-01-17 MED ORDER — PANTOPRAZOLE SODIUM 40 MG IV SOLR
40.0000 mg | Freq: Every day | INTRAVENOUS | Status: DC
  Administered 2012-01-17 – 2012-01-19 (×3): 40 mg via INTRAVENOUS
  Filled 2012-01-17 (×4): qty 40

## 2012-01-17 MED ORDER — DEXAMETHASONE SODIUM PHOSPHATE 4 MG/ML IJ SOLN
INTRAMUSCULAR | Status: DC | PRN
  Administered 2012-01-17: 4 mg via INTRAVENOUS

## 2012-01-17 MED ORDER — PANTOPRAZOLE SODIUM 40 MG IV SOLR: 40.0000 mg | Freq: Every day | INTRAVENOUS | Status: DC

## 2012-01-17 MED ORDER — THROMBIN 20000 UNITS EX SOLR
CUTANEOUS | Status: AC
  Filled 2012-01-17: qty 20000

## 2012-01-17 MED ORDER — HYDROMORPHONE 0.3 MG/ML IV SOLN
INTRAVENOUS | Status: AC
  Administered 2012-01-17: 12:00:00
  Filled 2012-01-17: qty 25

## 2012-01-17 MED ORDER — MEPERIDINE HCL 25 MG/ML IJ SOLN: 6.2500 mg | INTRAMUSCULAR | Status: DC | PRN

## 2012-01-17 MED ORDER — KCL IN DEXTROSE-NACL 20-5-0.45 MEQ/L-%-% IV SOLN
INTRAVENOUS | Status: DC
  Administered 2012-01-17 – 2012-01-18 (×2): via INTRAVENOUS
  Administered 2012-01-18: 100 mL/h via INTRAVENOUS
  Administered 2012-01-19 – 2012-01-20 (×2): via INTRAVENOUS
  Filled 2012-01-17 (×9): qty 1000

## 2012-01-17 MED ORDER — METHOCARBAMOL 100 MG/ML IJ SOLN
500.0000 mg | Freq: Four times a day (QID) | INTRAVENOUS | Status: DC | PRN
  Administered 2012-01-17: 500 mg via INTRAVENOUS
  Filled 2012-01-17: qty 5

## 2012-01-17 MED ORDER — ONDANSETRON HCL 4 MG/2ML IJ SOLN: 4.0000 mg | Freq: Four times a day (QID) | INTRAMUSCULAR | Status: DC | PRN

## 2012-01-17 MED ORDER — ZOLPIDEM TARTRATE 5 MG PO TABS: 5.0000 mg | ORAL_TABLET | Freq: Every evening | ORAL | Status: DC | PRN

## 2012-01-17 MED ORDER — SODIUM CHLORIDE 0.9 % IJ SOLN: 3.0000 mL | INTRAMUSCULAR | Status: DC | PRN

## 2012-01-17 MED ORDER — LACTATED RINGERS IV SOLN
INTRAVENOUS | Status: DC | PRN
  Administered 2012-01-17 (×3): via INTRAVENOUS

## 2012-01-17 MED ORDER — HYDROMORPHONE HCL PF 1 MG/ML IJ SOLN
INTRAMUSCULAR | Status: AC
  Filled 2012-01-17: qty 2

## 2012-01-17 MED ORDER — BISACODYL 10 MG RE SUPP: 10.0000 mg | Freq: Every day | RECTAL | Status: DC | PRN

## 2012-01-17 MED ORDER — DOUBLE ANTIBIOTIC 500-10000 UNIT/GM EX OINT
TOPICAL_OINTMENT | CUTANEOUS | Status: AC
  Filled 2012-01-17: qty 1

## 2012-01-17 MED ORDER — NALOXONE HCL 0.4 MG/ML IJ SOLN: 0.4000 mg | INTRAMUSCULAR | Status: DC | PRN

## 2012-01-17 MED ORDER — SENNA 8.6 MG PO TABS: 1.0000 | ORAL_TABLET | Freq: Two times a day (BID) | ORAL | Status: DC

## 2012-01-17 MED ORDER — ONDANSETRON HCL 4 MG/2ML IJ SOLN
INTRAMUSCULAR | Status: DC | PRN
  Administered 2012-01-17: 4 mg via INTRAVENOUS

## 2012-01-17 MED ORDER — DOCUSATE SODIUM 100 MG PO CAPS: 100.0000 mg | ORAL_CAPSULE | Freq: Two times a day (BID) | ORAL | Status: DC

## 2012-01-17 MED ORDER — LIDOCAINE HCL (PF) 1 % IJ SOLN
INTRAMUSCULAR | Status: AC
  Filled 2012-01-17: qty 30

## 2012-01-17 MED ORDER — CEFAZOLIN SODIUM 1-5 GM-% IV SOLN: 1.0000 g | Freq: Three times a day (TID) | INTRAVENOUS | Status: DC

## 2012-01-17 MED ORDER — ALBUTEROL SULFATE HFA 108 (90 BASE) MCG/ACT IN AERS
INHALATION_SPRAY | RESPIRATORY_TRACT | Status: DC | PRN
  Administered 2012-01-17: 3 via RESPIRATORY_TRACT

## 2012-01-17 MED ORDER — MENTHOL 3 MG MT LOZG: 1.0000 | LOZENGE | OROMUCOSAL | Status: DC | PRN

## 2012-01-17 MED ORDER — 0.9 % SODIUM CHLORIDE (POUR BTL) OPTIME
TOPICAL | Status: DC | PRN
  Administered 2012-01-17: 1000 mL

## 2012-01-17 MED ORDER — MIDAZOLAM HCL 5 MG/5ML IJ SOLN
INTRAMUSCULAR | Status: DC | PRN
  Administered 2012-01-17: 2 mg via INTRAVENOUS

## 2012-01-17 MED ORDER — HYDROCODONE-ACETAMINOPHEN 10-325 MG PO TABS
1.0000 | ORAL_TABLET | ORAL | Status: DC | PRN
Start: 2012-01-17 — End: 2012-01-20
  Administered 2012-01-17 – 2012-01-20 (×12): 2 via ORAL
  Filled 2012-01-17 (×12): qty 2

## 2012-01-17 MED ORDER — SUMATRIPTAN SUCCINATE 25 MG PO TABS
25.0000 mg | ORAL_TABLET | ORAL | Status: DC | PRN
  Administered 2012-01-20: 25 mg via ORAL
  Filled 2012-01-17 (×2): qty 1

## 2012-01-17 MED ORDER — DIPHENHYDRAMINE HCL 50 MG/ML IJ SOLN: 12.5000 mg | Freq: Four times a day (QID) | INTRAMUSCULAR | Status: DC | PRN

## 2012-01-17 MED ORDER — ACETAMINOPHEN 325 MG PO TABS
650.0000 mg | ORAL_TABLET | ORAL | Status: DC | PRN
  Administered 2012-01-20: 650 mg via ORAL
  Filled 2012-01-17: qty 2

## 2012-01-17 MED ORDER — MIDAZOLAM HCL 2 MG/2ML IJ SOLN: 0.5000 mg | Freq: Once | INTRAMUSCULAR | Status: DC | PRN

## 2012-01-17 MED ORDER — INFLUENZA VIRUS VACC SPLIT PF IM SUSP
0.5000 mL | INTRAMUSCULAR | Status: AC
  Filled 2012-01-17: qty 0.5

## 2012-01-17 MED ORDER — BISACODYL 10 MG RE SUPP
10.0000 mg | Freq: Every day | RECTAL | Status: DC | PRN
  Administered 2012-01-20: 10 mg via RECTAL
  Filled 2012-01-17: qty 1

## 2012-01-17 MED ORDER — PROMETHAZINE HCL 25 MG/ML IJ SOLN: 6.2500 mg | INTRAMUSCULAR | Status: DC | PRN

## 2012-01-17 MED ORDER — HYDROMORPHONE HCL PF 1 MG/ML IJ SOLN
INTRAMUSCULAR | Status: AC
  Filled 2012-01-17: qty 1

## 2012-01-17 MED ORDER — CHLORHEXIDINE GLUCONATE 4 % EX LIQD: 60.0000 mL | Freq: Once | CUTANEOUS | Status: DC

## 2012-01-17 MED ORDER — METHOCARBAMOL 500 MG PO TABS
500.0000 mg | ORAL_TABLET | Freq: Four times a day (QID) | ORAL | Status: DC | PRN
  Administered 2012-01-18: 500 mg via ORAL
  Filled 2012-01-17 (×2): qty 1

## 2012-01-17 MED ORDER — DOCUSATE SODIUM 100 MG PO CAPS
100.0000 mg | ORAL_CAPSULE | Freq: Two times a day (BID) | ORAL | Status: DC
  Administered 2012-01-17 – 2012-01-20 (×7): 100 mg via ORAL
  Filled 2012-01-17 (×8): qty 1

## 2012-01-17 MED ORDER — ALBUTEROL SULFATE HFA 108 (90 BASE) MCG/ACT IN AERS
2.0000 | INHALATION_SPRAY | Freq: Four times a day (QID) | RESPIRATORY_TRACT | Status: DC | PRN
  Administered 2012-01-19: 2 via RESPIRATORY_TRACT

## 2012-01-17 MED ORDER — HYDROMORPHONE 0.3 MG/ML IV SOLN: INTRAVENOUS | Status: DC

## 2012-01-17 MED ORDER — BUPIVACAINE HCL (PF) 0.25 % IJ SOLN
INTRAMUSCULAR | Status: AC
  Filled 2012-01-17: qty 30

## 2012-01-17 MED ORDER — CEFAZOLIN SODIUM 1-5 GM-% IV SOLN
1.0000 g | Freq: Three times a day (TID) | INTRAVENOUS | Status: AC
  Administered 2012-01-17 – 2012-01-18 (×2): 1 g via INTRAVENOUS
  Filled 2012-01-17 (×2): qty 50

## 2012-01-17 MED ORDER — GLYCOPYRROLATE 0.2 MG/ML IJ SOLN
INTRAMUSCULAR | Status: DC | PRN
  Administered 2012-01-17: .8 mg via INTRAVENOUS

## 2012-01-17 MED ORDER — SODIUM CHLORIDE 0.9 % IR SOLN
Status: DC | PRN
  Administered 2012-01-17: 09:00:00

## 2012-01-17 MED ORDER — HYDROMORPHONE 0.3 MG/ML IV SOLN
INTRAVENOUS | Status: DC
  Administered 2012-01-17: 2.7 mg via INTRAVENOUS
  Administered 2012-01-17: 4.5 mg via INTRAVENOUS
  Administered 2012-01-18: 2.7 mg via INTRAVENOUS
  Administered 2012-01-18: 7.5 mg via INTRAVENOUS
  Administered 2012-01-18: 2.4 mg via INTRAVENOUS
  Administered 2012-01-19: 5.3 mg via INTRAVENOUS
  Administered 2012-01-19: 4.43 mg via INTRAVENOUS
  Administered 2012-01-19: 1.8 mg via INTRAVENOUS
  Administered 2012-01-19: 1.5 mg via INTRAVENOUS
  Administered 2012-01-19: 1.2 mg via INTRAVENOUS
  Administered 2012-01-19: 18:00:00 via INTRAVENOUS
  Administered 2012-01-19: 2.7 mg via INTRAVENOUS
  Administered 2012-01-20: 01:00:00 via INTRAVENOUS
  Administered 2012-01-20: 3.41 mg via INTRAVENOUS
  Administered 2012-01-20: 1.2 mg via INTRAVENOUS
  Administered 2012-01-20: 4.2 mg via INTRAVENOUS
  Filled 2012-01-17 (×7): qty 25

## 2012-01-17 MED ORDER — SENNA 8.6 MG PO TABS
1.0000 | ORAL_TABLET | Freq: Two times a day (BID) | ORAL | Status: DC
  Administered 2012-01-17 – 2012-01-20 (×7): 8.6 mg via ORAL
  Filled 2012-01-17 (×8): qty 1

## 2012-01-17 MED ORDER — HYDROMORPHONE HCL PF 1 MG/ML IJ SOLN
0.2500 mg | INTRAMUSCULAR | Status: DC | PRN
  Administered 2012-01-17: 0.5 mg via INTRAVENOUS
  Administered 2012-01-17: 1 mg via INTRAVENOUS
  Administered 2012-01-17 (×2): 0.5 mg via INTRAVENOUS

## 2012-01-17 MED ORDER — METHOCARBAMOL 500 MG PO TABS: 500.0000 mg | ORAL_TABLET | Freq: Four times a day (QID) | ORAL | Status: DC | PRN

## 2012-01-17 MED ORDER — KETOROLAC TROMETHAMINE 30 MG/ML IJ SOLN: 15.0000 mg | Freq: Once | INTRAMUSCULAR | Status: DC | PRN

## 2012-01-17 MED ORDER — SODIUM CHLORIDE 0.9 % IV SOLN: 250.0000 mL | INTRAVENOUS | Status: DC

## 2012-01-17 SURGICAL SUPPLY — 99 items
ALIF W/SCREWS STANDARD 10MM (Orthopedic Implant) ×6 IMPLANT
BAG DECANTER FOR FLEXI CONT (MISCELLANEOUS) ×3 IMPLANT
BENZOIN TINCTURE PRP APPL 2/3 (GAUZE/BANDAGES/DRESSINGS) ×3 IMPLANT
BLADE SURG ROTATE 9660 (MISCELLANEOUS) ×3 IMPLANT
BUR MATCHSTICK NEURO 3.0 LAGG (BURR) IMPLANT
CARTRIDGE OIL MAESTRO DRILL (MISCELLANEOUS) IMPLANT
CLOTH BEACON ORANGE TIMEOUT ST (SAFETY) ×3 IMPLANT
CLSR STERI-STRIP ANTIMIC 1/2X4 (GAUZE/BANDAGES/DRESSINGS) ×6 IMPLANT
CONT SPECI 4OZ STER CLIK (MISCELLANEOUS) ×6 IMPLANT
CORDS BIPOLAR (ELECTRODE) ×6 IMPLANT
COVER SURGICAL LIGHT HANDLE (MISCELLANEOUS) ×3 IMPLANT
DECANTER SPIKE VIAL GLASS SM (MISCELLANEOUS) IMPLANT
DERMABOND ADVANCED (GAUZE/BANDAGES/DRESSINGS)
DERMABOND ADVANCED .7 DNX12 (GAUZE/BANDAGES/DRESSINGS) IMPLANT
DIFFUSER DRILL AIR PNEUMATIC (MISCELLANEOUS) IMPLANT
DRAIN CHANNEL 15F RND FF W/TCR (WOUND CARE) IMPLANT
DRAIN TLS ROUND 10FR (DRAIN) IMPLANT
DRAPE PROXIMA HALF (DRAPES) ×6 IMPLANT
DRAPE SURG 17X23 STRL (DRAPES) ×9 IMPLANT
DRAPE TABLE COVER HEAVY DUTY (DRAPES) IMPLANT
DRSG MEPILEX BORDER 4X12 (GAUZE/BANDAGES/DRESSINGS) ×3 IMPLANT
DRSG MEPILEX BORDER 4X4 (GAUZE/BANDAGES/DRESSINGS) IMPLANT
DRSG MEPILEX BORDER 4X8 (GAUZE/BANDAGES/DRESSINGS) IMPLANT
DRSG OPSITE 11X17.75 LRG (GAUZE/BANDAGES/DRESSINGS) IMPLANT
DURAPREP 26ML APPLICATOR (WOUND CARE) ×3 IMPLANT
ELECT BLADE 4.0 EZ CLEAN MEGAD (MISCELLANEOUS) ×3
ELECT CAUTERY BLADE 6.4 (BLADE) ×3 IMPLANT
ELECT REM PT RETURN 9FT ADLT (ELECTROSURGICAL) ×3
ELECTRODE BLDE 4.0 EZ CLN MEGD (MISCELLANEOUS) ×2 IMPLANT
ELECTRODE REM PT RTRN 9FT ADLT (ELECTROSURGICAL) ×2 IMPLANT
EVACUATOR 1/8 PVC DRAIN (DRAIN) IMPLANT
EVACUATOR SILICONE 100CC (DRAIN) IMPLANT
FILTER STRAW FLUID ASPIR (MISCELLANEOUS) IMPLANT
GAUZE SPONGE 4X4 16PLY XRAY LF (GAUZE/BANDAGES/DRESSINGS) ×3 IMPLANT
GLOVE BIO SURGEON STRL SZ7.5 (GLOVE) ×3 IMPLANT
GLOVE BIOGEL PI IND STRL 7.0 (GLOVE) ×4 IMPLANT
GLOVE BIOGEL PI IND STRL 7.5 (GLOVE) IMPLANT
GLOVE BIOGEL PI IND STRL 9 (GLOVE) ×2 IMPLANT
GLOVE BIOGEL PI INDICATOR 7.0 (GLOVE) ×2
GLOVE BIOGEL PI INDICATOR 7.5 (GLOVE)
GLOVE BIOGEL PI INDICATOR 9 (GLOVE) ×1
GLOVE ECLIPSE 7.5 STRL STRAW (GLOVE) ×3 IMPLANT
GLOVE SS BIOGEL STRL SZ 7 (GLOVE) IMPLANT
GLOVE SS BIOGEL STRL SZ 8.5 (GLOVE) ×2 IMPLANT
GLOVE SUPERSENSE BIOGEL SZ 7 (GLOVE)
GLOVE SUPERSENSE BIOGEL SZ 8.5 (GLOVE) ×1
GLOVE SURG SS PI 6.5 STRL IVOR (GLOVE) ×6 IMPLANT
GOWN PREVENTION PLUS XLARGE (GOWN DISPOSABLE) ×9 IMPLANT
GOWN PREVENTION PLUS XXLARGE (GOWN DISPOSABLE) ×3 IMPLANT
GOWN STRL NON-REIN LRG LVL3 (GOWN DISPOSABLE) IMPLANT
IV CATH 14GX2 1/4 (CATHETERS) IMPLANT
KIT BASIN OR (CUSTOM PROCEDURE TRAY) ×3 IMPLANT
KIT POSITION SURG JACKSON T1 (MISCELLANEOUS) IMPLANT
KIT ROOM TURNOVER OR (KITS) ×3 IMPLANT
NEEDLE 27GAX1X1/2 (NEEDLE) IMPLANT
NEEDLE SPNL 22GX3.5 QUINCKE BK (NEEDLE) ×3 IMPLANT
NS IRRIG 1000ML POUR BTL (IV SOLUTION) ×3 IMPLANT
OIL CARTRIDGE MAESTRO DRILL (MISCELLANEOUS)
PACK LAMINECTOMY ORTHO (CUSTOM PROCEDURE TRAY) ×3 IMPLANT
PACK UNIVERSAL I (CUSTOM PROCEDURE TRAY) ×3 IMPLANT
PAD ARMBOARD 7.5X6 YLW CONV (MISCELLANEOUS) ×6 IMPLANT
PATTIES SURGICAL .5 X1 (DISPOSABLE) IMPLANT
PATTIES SURGICAL .75X.75 (GAUZE/BANDAGES/DRESSINGS) IMPLANT
PATTIES SURGICAL 1X1 (DISPOSABLE) IMPLANT
PUTTY BONE BIOATIVE 10CC (Putty) ×3 IMPLANT
SCREW .5X25 (Screw) ×18 IMPLANT
SPONGE INTESTINAL PEANUT (DISPOSABLE) ×6 IMPLANT
SPONGE LAP 18X18 X RAY DECT (DISPOSABLE) ×6 IMPLANT
SPONGE NEURO XRAY DETECT 1X3 (DISPOSABLE) IMPLANT
SPONGE SURGIFOAM ABS GEL 100 (HEMOSTASIS) ×6 IMPLANT
SURGIFLO TRUKIT (HEMOSTASIS) ×3 IMPLANT
SUT ETHILON 2 0 FS 18 (SUTURE) IMPLANT
SUT VIC AB 0 CT1 27 (SUTURE) ×2
SUT VIC AB 0 CT1 27XBRD ANBCTR (SUTURE) ×4 IMPLANT
SUT VIC AB 1 CT1 18XCR BRD 8 (SUTURE) ×2 IMPLANT
SUT VIC AB 1 CT1 27 (SUTURE) ×4
SUT VIC AB 1 CT1 27XBRD ANBCTR (SUTURE) ×8 IMPLANT
SUT VIC AB 1 CT1 8-18 (SUTURE) ×1
SUT VIC AB 1 CTX 18 (SUTURE) IMPLANT
SUT VIC AB 1 CTX 36 (SUTURE) ×1
SUT VIC AB 1 CTX36XBRD ANBCTR (SUTURE) ×2 IMPLANT
SUT VIC AB 2-0 CT1 18 (SUTURE) ×3 IMPLANT
SUT VIC AB 2-0 CT1 27 (SUTURE) ×4
SUT VIC AB 2-0 CT1 TAPERPNT 27 (SUTURE) ×8 IMPLANT
SUT VIC AB 3-0 SH 27 (SUTURE) ×5
SUT VIC AB 3-0 SH 27X BRD (SUTURE) ×6 IMPLANT
SUT VIC AB 3-0 SH 27XBRD (SUTURE) ×4 IMPLANT
SUT VIC AB 3-0 X1 27 (SUTURE) ×3 IMPLANT
SYR 3ML LL SCALE MARK (SYRINGE) IMPLANT
SYR BULB IRRIGATION 50ML (SYRINGE) ×3 IMPLANT
SYR CONTROL 10ML LL (SYRINGE) ×3 IMPLANT
SYRINGE 10CC LL (SYRINGE) ×3 IMPLANT
SYSTEM CHEST DRAIN TLS 7FR (DRAIN) IMPLANT
TOWEL OR 17X24 6PK STRL BLUE (TOWEL DISPOSABLE) ×3 IMPLANT
TOWEL OR 17X26 10 PK STRL BLUE (TOWEL DISPOSABLE) ×3 IMPLANT
TRAY FOLEY CATH 14FR (SET/KITS/TRAYS/PACK) IMPLANT
TRAY FOLEY CATH 14FRSI W/METER (CATHETERS) ×3 IMPLANT
TUBE SUCT ARGYLE STRL (TUBING) ×6 IMPLANT
WATER STERILE IRR 1000ML POUR (IV SOLUTION) IMPLANT

## 2012-01-17 NOTE — H&P (Signed)
CC: back pain, s/p repair spinal fixation  History of present illness: This woman has had extensive surgery for thoracolumbar scoliosis. The summer of 2013 that she had broken posterior fixation indicating an underlying pseudoarthrosis. She was taken to the operating room in August 2013 where the broken fixation was repaired in pseudoarthroses identified at L3-4 and L4-5. She is admitted at this time for anterior interbody fusions at L3-4 and L4-5. Surgery has delayed for issues of personal health-related agent.  View of systems: She is an asthmatic. She has migraine headaches.  Social: He is a smoker, several cigarettes per day. She is married. Her husband is disabled by a chronic severe knee problem for which he has had a number of surgeries. Have a small child.  Physical examination: She appears healthy. She is cooperative and appropriate.  HEENT: Clear, normal extraocular motion, pharynx clear, no cervical lymphadenopathy  Cardiovascular: Regular rhythm, no peripheral edema, no murmurs appreciated, no carotid bruits appreciated.  Respiratory: No rhonchi this morning  Abdomen: Moderately obese, nontender, no masses palpated  Genitourinary: Not examined  Musculoskeletal/neurological: No neurologic abnormalities in either lower extremity.  Diagnosis: That is post repair of posterior spinal fixation, underlying L3-4 and L4-5 pseudoarthroses  Discussion: As a plan second stage procedure. We will utilize titanium cages, graft from the left iliac crest, and probably graft extender in the form of bile glass

## 2012-01-17 NOTE — Transfer of Care (Signed)
Immediate Anesthesia Transfer of Care Note  Patient: [name]  Procedure(s) Performed: Procedure(s) (LRB) with comments: ANTERIOR LUMBAR FUSION 2 LEVELS (Bilateral) - Anterior Lumbar 3-4, Lumbar 4-5 fusion with bone graft harvest  Patient Location: PACU  Anesthesia Type:General  Level of Consciousness: awake, alert , oriented and patient cooperative  Airway & Oxygen Therapy: Patient Spontanous Breathing and Patient connected to nasal cannula oxygen  Post-op Assessment: Report given to PACU RN, Post -op Vital signs reviewed and stable and Patient moving all extremities  Post vital signs: Reviewed and stable  Complications: No apparent anesthesia complications

## 2012-01-17 NOTE — Progress Notes (Signed)
UR COMPLETED  

## 2012-01-17 NOTE — H&P (View-Only) (Signed)
LEFT MESSAGE  WITH SHERRY AT DR TOOKE'S OFFICE FOR ORDERS AND MESSAGE LEFT  FOR DR. DICKSON (NEED ORDERS).

## 2012-01-17 NOTE — Anesthesia Preprocedure Evaluation (Signed)
Anesthesia Evaluation  Patient identified by MRN, date of birth, ID band Patient awake    Reviewed: Allergy & Precautions, H&P , NPO status , Patient's Chart, lab work & pertinent test results  History of Anesthesia Complications (+) PONV and DIFFICULT AIRWAY  Airway Mallampati: II TM Distance: <3 FB Neck ROM: Full    Dental No notable dental hx. (+) Teeth Intact and Dental Advisory Given,    Pulmonary neg shortness of breath, asthma (daily wheezing and inhalers) , Current Smoker,  Recent cold- symptoms subsiding- residual congestion present   + wheezing      Cardiovascular +CHF (h/o CHF with pre-eclampsia, ECHO '12 normal LVF, normal valves, EF 60-65%) Rhythm:Regular Rate:Normal     Neuro/Psych  Headaches (migraines 3/months),    GI/Hepatic negative GI ROS, Neg liver ROS,   Endo/Other  Morbid obesity  Renal/GU negative Renal ROS     Musculoskeletal   Abdominal (+) + obese,   Peds  Hematology   Anesthesia Other Findings   Reproductive/Obstetrics LMP 09/25/11                           Anesthesia Physical  Anesthesia Plan  ASA: II  Anesthesia Plan: General   Post-op Pain Management:    Induction: Intravenous  Airway Management Planned: Oral ETT  Additional Equipment:   Intra-op Plan:   Post-operative Plan: Extubation in OR  Informed Consent: I have reviewed the patients History and Physical, chart, labs and discussed the procedure including the risks, benefits and alternatives for the proposed anesthesia with the patient or authorized representative who has indicated his/her understanding and acceptance.   Dental advisory given  Plan Discussed with: CRNA and Surgeon  Anesthesia Plan Comments: (Plan routine monitors, GETA)        Anesthesia Quick Evaluation

## 2012-01-17 NOTE — Anesthesia Procedure Notes (Addendum)
Procedure Name: Intubation Date/Time: 01/17/2012 8:01 AM Performed by: Hermelinda Dellen A Pre-anesthesia Checklist: Patient identified, Emergency Drugs available, Suction available, Patient being monitored and Timeout performed Patient Re-evaluated:Patient Re-evaluated prior to inductionOxygen Delivery Method: Circle system utilized Preoxygenation: Pre-oxygenation with 100% oxygen (easy mask airway) Intubation Type: IV induction Ventilation: Mask ventilation without difficulty Laryngoscope Size: Mac and 3 Grade View: Grade I Tube type: Oral Tube size: 7.0 mm Number of attempts: 1 Airway Equipment and Method: Stylet Placement Confirmation: ETT inserted through vocal cords under direct vision,  positive ETCO2,  CO2 detector and breath sounds checked- equal and bilateral (Grade I view without difficulty, atraumatic intubation) Secured at: 22 cm Tube secured with: Tape Dental Injury: Teeth and Oropharynx as per pre-operative assessment

## 2012-01-17 NOTE — Interval H&P Note (Signed)
Update unnecessary, H&P dictated this morning

## 2012-01-17 NOTE — Anesthesia Postprocedure Evaluation (Signed)
Anesthesia Post Note  Patient: [name]  Procedure(s) Performed: Procedure(s) (LRB): ANTERIOR LUMBAR FUSION 2 LEVELS (Bilateral)  Anesthesia type: GA  Patient location: PACU  Post pain: Pain level controlled  Post assessment: Post-op Vital signs reviewed  Last Vitals:  Filed Vitals:   01/17/12 1300  BP:   Pulse: 101  Temp:   Resp: 14    Post vital signs: Reviewed  Level of consciousness: sedated  Complications: No apparent anesthesia complications

## 2012-01-17 NOTE — Op Note (Signed)
Dictation [id number]

## 2012-01-17 NOTE — Progress Notes (Signed)
Seen in Rec. Rm. At 12:05  Moves both feet and ankles well, good DP pulse left foot.

## 2012-01-17 NOTE — Brief Op Note (Signed)
Preoperative diagnosis: L3-4 and L4-5 pseudoarthrosis status post posterior spinal fusion for scoliosis  Postoperative diagnosis: The same  Operative procedure: Anterior interbody fusion L3-4 and L4-5, utilizing autogenous left anterior iliac crest graft and bile glass; insertion of Titan cages with anterior screw fixation  EBL: Approximately 250 cc  Complications: None

## 2012-01-18 ENCOUNTER — Inpatient Hospital Stay (HOSPITAL_COMMUNITY): Payer: Medicare Other

## 2012-01-18 ENCOUNTER — Encounter (HOSPITAL_COMMUNITY): Payer: Self-pay | Admitting: Orthopedic Surgery

## 2012-01-18 LAB — BASIC METABOLIC PANEL
BUN: 5 mg/dL — ABNORMAL LOW (ref 6–23)
Calcium: 7.7 mg/dL — ABNORMAL LOW (ref 8.4–10.5)
GFR calc non Af Amer: >90 mL/min (ref >90)
Glucose, Bld: 471 mg/dL — ABNORMAL HIGH (ref 70–99)

## 2012-01-18 LAB — CBC
HCT: 32.8 % — ABNORMAL LOW (ref 36.0–46.0)
Hemoglobin: 10.9 g/dL — ABNORMAL LOW (ref 12.0–15.0)
MCH: 31.2 pg (ref 26.0–34.0)
MCHC: 33.2 g/dL (ref 30.0–36.0)

## 2012-01-18 MED ORDER — HYDROMORPHONE HCL PF 1 MG/ML IJ SOLN
1.0000 mg | INTRAMUSCULAR | Status: AC
  Administered 2012-01-18: 1 mg via INTRAVENOUS
  Filled 2012-01-18: qty 1

## 2012-01-18 MED ORDER — RIVAROXABAN 10 MG PO TABS
10.0000 mg | ORAL_TABLET | Freq: Every day | ORAL | Status: DC
  Administered 2012-01-18 – 2012-01-19 (×2): 10 mg via ORAL
  Filled 2012-01-18 (×3): qty 1

## 2012-01-18 NOTE — Care Management Note (Signed)
    Page 1 of 2   01/18/2012     4:08:15 PM   CARE MANAGEMENT NOTE 01/18/2012  Patient:  [name], [name]   Account Number:  [id number]  Date Initiated:  01/18/2012  Documentation initiated by:  Anette Guarneri  Subjective/Objective Assessment:   PLIF  no HH PT/OT recommended  needs cane  is on xarelto     Action/Plan:   home with self care   Anticipated DC Date:  01/19/2012   Anticipated DC Plan:  HOME W HOME HEALTH SERVICES      DC Planning Services  CM consult      [facility] Choice  DURABLE MEDICAL EQUIPMENT   Choice offered to / List presented to:     DME arranged  [facility]      DME agency  [facility]        Status of service:   Medicare Important Message given?   (If response is "NO", the following Medicare IM given date fields will be blank) Date Medicare IM given:   Date Additional Medicare IM given:    Discharge Disposition:  HOME/SELF CARE  Per UR Regulation:  Reviewed for med. necessity/level of care/duration of stay  If discussed at Long Length of Stay Meetings, dates discussed:    Comments:  01/18/12  16:05 Anette Guarneri RN/CM per PT/OT recommendations, no f/u needed, did recommend cane for home use, DME ordered and will be delivered to room prior to d/c Patient is on Xarelto, contacted patient pharmacy/[facility] @ [phone], per pharmacy they do have Xarelto 10mg  in stock.

## 2012-01-18 NOTE — Progress Notes (Signed)
Seen in room 5 N25 tonight  Vital signs stable, afebrile  S: c/o pain and that her PCA has maxed out, but that when it was functioning the IV had infiltrated in her left arm.  Walked today with PT ant that apparently went well, no flatus yet, C/O right hip pain and asymmetry   O: in air bed semi-reclined  A: stable, pain control problematic  P:  Single IV dose of dilaudid, x-ray pelvis

## 2012-01-18 NOTE — Progress Notes (Signed)
INITIAL ADULT NUTRITION ASSESSMENT Date: 01/18/2012   Time: 4:01 PM Reason for Assessment: Nutrition Risk  ASSESSMENT: Female 31 y.o.  [id number] L4-5, L3-4 status post  posterior revision, instrumentation, and posterior fusion, status post  thoracolumbar fusion for scoliosis.    Hx:  Past Medical History  Diagnosis Date  . Complication of anesthesia   . Difficult intubation     "needs small tube"  . PONV (postoperative nausea and vomiting)   . Family history of anesthesia complication     Nausea  . Respiratory failure 11/2010; 09/2011  . Chronic back pain   . CHF (congestive heart failure) 2006    "with pregnancy, toxcemia "  not seeing a cardiologist now.  . Asthma     "very badly" (01/17/2012)  . Pneumonia     "~ once/yr" (01/17/2012)  . Chronic bronchitis     "@ least once/yr" (01/17/2012)  . Exertional dyspnea     Asthma  . GERD (gastroesophageal reflux disease)   . Migraines   . Degenerative disk disease   . Anxiety   . Cervical cancer 1999; 2000   Past Surgical History  Procedure Date  . Spinal fusion 10/04/2011    Procedure: FUSION POSTERIOR SPINAL MULTILEVEL;  Surgeon: Mat Carne, MD;  Location: [facility] OR;  Service: Orthopedics;  Laterality: Bilateral;  repair lumbar pseudoarthritis, revision segmental fixation, L3-L4, L4-L5  . Cervical cone biopsy 1999; 2000  . Adenoidectomy, tonsillectomy and myringotomy with tube placement 1983?  Marland Kitchen Tonsillectomy   . Knee arthroscopy 2004 X 2    "left X 2" (01/17/2012)  . Anterior lumbar fusion 01/17/2012  . Lumbar disc surgery 06/1998    "L4-5" (01/17/2012)  . Lumbar fusion 08/1998; 2010; 2012; 06/2011    "fusion cage; put rods in; reanchored screws; rod broke" (01/17/2012)  . Anterior lumbar fusion 01/17/2012    Procedure: ANTERIOR LUMBAR FUSION 2 LEVELS;  Surgeon: Mat Carne, MD;  Location: [facility] OR;  Service: Orthopedics;  Laterality: Bilateral;  Anterior Lumbar 3-4, Lumbar 4-5 fusion with bone graft  harvest    Related Meds:     . budesonide-formoterol  2 puff Inhalation BID  . [COMPLETED]  ceFAZolin (ANCEF) IV  1 g Intravenous Q8H  . docusate sodium  100 mg Oral BID  . [EXPIRED] HYDROmorphone      . HYDROmorphone PCA 0.3 mg/mL   Intravenous Q4H  . influenza  inactive virus vaccine  0.5 mL Intramuscular Tomorrow-1000  . pantoprazole (PROTONIX) IV  40 mg Intravenous QHS  . rivaroxaban  10 mg Oral Q supper  . senna  1 tablet Oral BID  . sodium chloride  3 mL Intravenous Q12H  . varenicline  1 mg Oral BID     Ht:  5'4"  Wt:  170-# per pt.  Ideal Wt:    54.5 kg % Ideal Wt: 142  Usual Wt:  Wt Readings from Last 10 Encounters:  01/13/12 178 lb 5 oz (80.882 kg)  12/28/11 179 lb 8 oz (81.421 kg)  10/06/11 180 lb (81.647 kg)  10/06/11 180 lb (81.647 kg)    % Usual Wt: 85  BMI=29  Labs:  CMP     Component Value Date/Time   NA 129* 01/18/2012 0625   K 5.4* 01/18/2012 0625   CL 98 01/18/2012 0625   CO2 24 01/18/2012 0625   GLUCOSE 471* 01/18/2012 0625   BUN 5* 01/18/2012 0625   CREATININE 0.54 01/18/2012 0625   CALCIUM 7.7* 01/18/2012 0625   PROT 7.0 01/17/2012 0715  ALBUMIN 3.8 01/17/2012 0715   AST 15 01/17/2012 0715   ALT 16 01/17/2012 0715   ALKPHOS 53 01/17/2012 0715   BILITOT 0.3 01/17/2012 0715   GFRNONAA >90 01/18/2012 0625   GFRAA >90 01/18/2012 0625    I/O last 3 completed shifts: In: 2778.3 [I.V.:2778.3] Out: 2950 [Urine:2750; Blood:200]     Diet Order: General  Supplements/Tube Feeding: none IVF:    sodium chloride   dextrose 5 % and 0.45 % NaCl with KCl 20 mEq/L Last Rate: 100 mL/hr (01/18/12 0423)    Estimated Nutritional Needs:   Kcal: 1650-1750 Protein: 75-85 gm Fluid: >2L  Food/Nutrition Related Hx: Pt states good intake prior to admit but with a 35# weight loss in the past 6 months due to stress worrying about upcoming surgeries.  Now eating small amounts but post op. Weight loss although unintentional was needed.  NUTRITION  DIAGNOSIS: -Inadequate oral intake (NI-2.1).  Status: Ongoing  RELATED TO: poor appetite  AS EVIDENCE BY: reported intake  MONITORING/EVALUATION(Goals): Intake, labs, weight Goal:  Intake of ?75% meals and supplements  EDUCATION NEEDS: -Education needs addressed-discussed need to increase protein intake for wound healing and recovery.  INTERVENTION: Continue current diet.  Encouraged po especially protein.  Dietitian [phone]  DOCUMENTATION CODES Per approved criteria  -Not Applicable    [name] 01/18/2012, 4:01 PM

## 2012-01-18 NOTE — Progress Notes (Signed)
Occupational Therapy Discharge Patient Details Name: FRADEL BALDONADO MRN: [id number] DOB: December 16, 1980 Today's Date: 01/18/2012 Time:  -     Patient discharged from OT services secondary to pt with multiple surgeries on back and does not want OT at this time. Pt requesting sock aide reacher and shoe horn. Pt informed of locations in community and [facility] to purchase these items. OT does not have these items to distribute to patient. Patient also reported having these items provided by hospital in August. Pt has additional items at home per patients self report..  Please see latest therapy progress note for current level of functioning and progress toward goals.    Progress and discharge plan discussed with patient and/or caregiver: Patient/Caregiver agrees with plan Husband to assist with all adls at home. Pt brushing teeth at sink on arrival with PT Lauren.     Harrel Carina [place] Pager: [phone]  01/18/2012, 11:00 AM

## 2012-01-18 NOTE — Op Note (Signed)
[name], [name] NO.:  [id number]  MEDICAL RECORD NO.:  [id number]  LOCATION:  5N25C                        FACILITY:  [facility]  PHYSICIAN:  Nelda Severe, MD      DATE OF BIRTH:  1981-02-06  DATE OF PROCEDURE: DATE OF DISCHARGE:                              OPERATIVE REPORT   SURGEON:  Nelda Severe, MD  ASSISTANT:  Jolaine Artist, RNFA  PREOPERATIVE DIAGNOSIS:  Pseudoarthrosis L4-5, L3-4 status post posterior revision, instrumentation, and posterior fusion, status post thoracolumbar fusion for scoliosis.  POSTOPERATIVE DIAGNOSIS:  Pseudoarthrosis L4-5, L3-4 status post posterior revision, instrumentation, and posterior fusion, status post thoracolumbar fusion for scoliosis.  PROCEDURE:  The patient was placed under general endotracheal anesthesia.  A Foley catheter was placed in the bladder.  Sequential compression leggings were applied bilaterally.  Prophylaxis with cefazolin intravenously was administered.  The patient was positioned supine on a Jackson flat top table with a Gelfoam bump under the left buttock.  A cross-table portable lateral radiograph was taken confirming the approximate level for placement of incision to expose L3-4 and L4-5 and confirmed the suspected level, which was marked with a skin marker.  The abdomen was prepped with DuraPrep and draped in rectangular fashion. The drapes were secured with Ioban.  A time-out was then held and the usual information was discussed/confirmed.  An oblique incision was made from a point at around the tip of the 10th rib on the left side distally to a point just distal to the umbilicus over the left rectus sheath.  Dissection was carried down through the adipose layer to the external oblique fascia.  The external oblique was cut in line with its fibers, and the incision brought onto the rectus sheath, separating the fibers of the external oblique fascia from the rectus sheath.  A longitudinal  incision was then made laterally in the rectus sheath.  The lateral edge of the rectus abdominis muscle mobilized and retracted towards the right side.  The posterior rectus sheath was incised using a 15-blade and during this process, small rent occurred in the peritoneum, which did enlarge.  The peritoneum was further mobilized off the posterior rectus sheath, and later in the case the peritoneal rent was repaired, but at this point, dampened lap sponge was placed in the defect to prevent the bowel contents from escaping.  Blunt dissection was carried to the retroperitoneum, the psoas muscle identified, the left ureter and the left common iliac artery and eventually the left common iliac vein identified.  The disk at L3-4 was initially identified, marked with a bent spinal needle and cross-table lateral radiograph taken to confirm the level. We then exposed the L4-5 disk below as well, mobilizing the common iliac vein past the midline, but not completely so as to avoid the need to ligate and divide a very large ilial lumbar vein.  Segmental vessel at L3 was identified and coagulated, vein only.  We then, having mobilized the proximal common iliac vein/distal vena cava off the anterior aspect of the L3-4 disk, placed a table mounted Thompson retractor and insistent held and additional Wiley vein retracted.  I excised the anterior annulus from the left side to a point about 1 cm  right of the midline.  This allowed me to enucleate the disk satisfactorily and prepare the endplates.  The smallest footprint Titan implant 10 mm height was chosen.  Not mentioned here is that we had already harvested bone graft before we made the abdominal incision.  This was done through a short incision just distal to the left anterior iliac crest.  Fascia was incised down onto the crest and the superior crest denuded of periosteum attachment at a point greater than 2 inches posterior to the  anterior-superior iliac spine.  The top of the crest was perforated using a medium gouge and then we removed satisfactory quantity of cancellous bone from between the tables of the ilium with curettes and gouges.  The gluteal fascia and abdominal fascial insertion into the iliac crest was never disrupted.  The iliac crest defect was then packed with Gelfoam which stopped bleeding and deep tissues closed using 1 figure-of-eight #1 Vicryl suture, and the subcutaneous layer closed with inverted 0-Vicryl suture.  Once we had prepared the L3-4 disk space, the graft was mixed with Bio- glass and placed in the appropriate cage, a cage designed for screw fixation.  I then impacted the cage into place and placed 2 inferiorly directed screws and 1 superiorly directed screw to retain the cage.  We then moved down 1 interspace, moving the retractors, etc.  The same procedure was carried out at the L4-5 level which was very narrow at this level.  Again the smallest available cage was used after preparation of the disk space with curettes and osteotome and broach. Again 2 screws were directed distally and 1 proximally.  The screw was packed with the same mixture of autogenous iliac crest bone and some bio- glass.  Cross-table lateral radiograph was taken which showed satisfactory position of the cages at the correct levels.  At the end of the procedure, an AP radiograph taken to evaluate for any retained instruments or sponges also demonstrated anterior lumbar spine very well and showed satisfactory position of the cages and fixation screws.  The report as to there being no retained sponges or instruments will be dictated by the radiologist, but I did not see any.  We then, by this time had removed all retractors.  Care was taken to retract with a handheld retractor while I inspected the ureter to make sure that it was intact and had not been damaged.  The only bleeding was from the L3-4 disk  space.  A piece of Gelfoam was placed anterior to the L3-4 disk space.  We then closed the rent in the peritoneum with 3-0 Vicryl in continuous fashion.  The rectus sheath was then repaired using #1 Vicryl suture in continuous and interrupted fashion throughout its length.  The external oblique fascia was then closed with continuous #1 Vicryl suture and in the area of the rectus sheath it was stitched down to the rectus sheath in a pants-over-vest fashion.  The subcutaneous layer was closed using multiple interrupted and inverted 0 and 2-0 Vicryl sutures.  The skin was closed using subcuticular 3-0 undyed Vicryl and the skin edges reinforced with Steri- Strips.  Similar closure was carried out and the bone graft harvested.  There were no intraoperative complications.  The blood loss estimated about 200 mL.  Sponge and needle counts were correct.  At the time of dictation, the patient had just arrived in the recovery room, but I had not yet examined her.  It should be mentioned that during retraction of  the iliac vessels, the pulse oximeter which had been placed on the left great toe alarmed, but as soon as retraction was released, the oxygen saturation was 99% with indication of good distal circulation.  The common iliac artery appeared normal and certainly did not show any evidence of any damage.     Nelda Severe, MD     MT/MEDQ  D:  01/17/2012  T:  01/18/2012  Job:  [id number]

## 2012-01-18 NOTE — Evaluation (Signed)
Physical Therapy Evaluation Patient Details Name: [name] MRN: [id number] DOB: 07-07-1980 Today's Date: 01/18/2012 Time: 1030-1106 PT Time Calculation (min): 36 min  PT Assessment / Plan / Recommendation Clinical Impression  Pt is a 31 y/o female admitted s/p L3-5 ALIF along with the below PT problem list. Pt would benefit from acute PT to maximize independence and facilitate d/c home without f/u PT needed.    PT Assessment  Patient needs continued PT services    Follow Up Recommendations  No PT follow up    Does the patient have the potential to tolerate intense rehabilitation      Barriers to Discharge None      Equipment Recommendations  Cane    Recommendations for Other Services     Frequency Min 5X/week    Precautions / Restrictions Precautions Precautions: Back;Fall Precaution Booklet Issued: Yes (comment) Precaution Comments: Educated pt on 3/3 back precautions with pt able to verbalize all three. Restrictions Weight Bearing Restrictions: No   Pertinent Vitals/Pain 8/10 in back radiating toward left LE. Pt repositioned and use of PCA encouraged.      Mobility  Bed Mobility Bed Mobility: Rolling Right;Rolling Left;Right Sidelying to Sit;Sit to Sidelying Right Rolling Right: 5: Supervision;With rail Rolling Left: 5: Supervision Right Sidelying to Sit: 4: Min guard;HOB flat;With rails Sit to Sidelying Right: 4: Min guard;With rail;HOB flat Details for Bed Mobility Assistance: Verbal cues for safest sequence with guarding for safety. Transfers Transfers: Sit to Stand;Stand to Sit (2 trials.) Sit to Stand: 4: Min guard;With upper extremity assist;From bed;From toilet Stand to Sit: 4: Min guard;With upper extremity assist;To chair/3-in-1;To bed Details for Transfer Assistance: Guarding for balance with cues for safest hand placement. Ambulation/Gait Ambulation/Gait Assistance: 4: Min guard Ambulation Distance (Feet): 200 Feet (150 feet and 50  feet.) Assistive device: Other (Comment) (Pushing IV pole.) Ambulation/Gait Assistance Details: Guarding for balance with cues for tall posture and safety. Gait Pattern: Step-through pattern;Decreased stride length;Trunk flexed;Antalgic Stairs: No Wheelchair Mobility Wheelchair Mobility: No    Shoulder Instructions     Exercises     PT Diagnosis: Difficulty walking;Acute pain  PT Problem List: Decreased strength;Decreased activity tolerance;Decreased balance;Decreased mobility;Decreased knowledge of use of DME;Decreased knowledge of precautions;Pain PT Treatment Interventions: DME instruction;Gait training;Stair training;Functional mobility training;Therapeutic activities;Balance training;Patient/family education   PT Goals Acute Rehab PT Goals PT Goal Formulation: With patient Time For Goal Achievement: 01/25/12 Potential to Achieve Goals: Good Pt will Roll Supine to Right Side: with modified independence PT Goal: Rolling Supine to Right Side - Progress: Goal set today Pt will Roll Supine to Left Side: with modified independence PT Goal: Rolling Supine to Left Side - Progress: Goal set today Pt will go Supine/Side to Sit: with modified independence PT Goal: Supine/Side to Sit - Progress: Goal set today Pt will go Sit to Supine/Side: with modified independence PT Goal: Sit to Supine/Side - Progress: Goal set today Pt will go Sit to Stand: with modified independence PT Goal: Sit to Stand - Progress: Goal set today Pt will go Stand to Sit: with modified independence PT Goal: Stand to Sit - Progress: Goal set today Pt will Ambulate: >150 feet;with modified independence;with least restrictive assistive device PT Goal: Ambulate - Progress: Goal set today Pt will Go Up / Down Stairs: 1-2 stairs;with modified independence;with least restrictive assistive device PT Goal: Up/Down Stairs - Progress: Goal set today  Visit Information  Last PT Received On: 01/18/12 Assistance Needed: +1     Subjective Data  Subjective: "I've been  up some already." Patient Stated Goal: Go home.   Prior Functioning  Home Living Lives With: Spouse;Family Available Help at Discharge: Family;Available 24 hours/day Type of Home: House Home Access: Stairs to enter [employer] of Steps: 2 Entrance Stairs-Rails: None Home Layout: One level Home Adaptive Equipment: Walker - rolling;Shower chair without back Prior Function Level of Independence: Independent Able to Take Stairs?: Yes Driving: Yes Vocation: Retired Musician: No difficulties    Cognition  Overall Cognitive Status: Appears within functional limits for tasks assessed/performed Arousal/Alertness: Awake/alert Orientation Level: Appears intact for tasks assessed Behavior During Session: [facility] for tasks performed    Extremity/Trunk Assessment Right Upper Extremity Assessment RUE ROM/Strength/Tone: Within functional levels RUE Sensation: WFL - Light Touch RUE Coordination: WFL - gross motor Left Upper Extremity Assessment LUE ROM/Strength/Tone: Within functional levels LUE Sensation: WFL - Light Touch LUE Coordination: WFL - gross motor Right Lower Extremity Assessment RLE ROM/Strength/Tone: Within functional levels RLE Sensation: WFL - Light Touch RLE Coordination: WFL - gross motor Left Lower Extremity Assessment LLE ROM/Strength/Tone: Within functional levels LLE Sensation: WFL - Light Touch LLE Coordination: WFL - gross motor Trunk Assessment Trunk Assessment: Normal   Balance Balance Balance Assessed: No  End of Session PT - End of Session Equipment Utilized During Treatment: Gait belt Activity Tolerance: Patient tolerated treatment well Patient left: in bed;with call bell/phone within reach Nurse Communication: Mobility status  GP     Cephus Shelling 01/18/2012, 11:22 AM  01/18/2012 Cephus Shelling, PT, DPT [phone]

## 2012-01-19 ENCOUNTER — Inpatient Hospital Stay (HOSPITAL_COMMUNITY): Payer: Medicare Other

## 2012-01-19 MED FILL — Sodium Chloride IV Soln 0.9%: INTRAVENOUS | Qty: 1000 | Status: AC

## 2012-01-19 MED FILL — Heparin Sodium (Porcine) Inj 1000 Unit/ML: INTRAMUSCULAR | Qty: 30 | Status: AC

## 2012-01-19 MED FILL — Sodium Chloride Irrigation Soln 0.9%: Qty: 3000 | Status: AC

## 2012-01-19 NOTE — Progress Notes (Signed)
Seen in  Room # 5N25  Afebrile, vital signs stable  S: c/o pain in incision and bone graft site ; has had a BM  O: lying in bed, moves lower ext's well  A: c/o pain, will need to get off PCA tomorrow

## 2012-01-19 NOTE — Progress Notes (Signed)
Physical Therapy Treatment Patient Details Name: [name] MRN: [id number] DOB: Mar 28, 1980 Today's Date: 01/19/2012 Time: 6578-4696 PT Time Calculation (min): 31 min  PT Assessment / Plan / Recommendation Comments on Treatment Session  Pt progressing well towards her goals and motivated to get well. Still with elevated pain levels.    Follow Up Recommendations  No PT follow up           Equipment Recommendations  Cane       Frequency Min 5X/week   Plan Discharge plan remains appropriate;Frequency remains appropriate    Precautions / Restrictions Precautions Precautions: Back;Fall Precaution Comments: pt able to state 3/3 back precautions       Mobility  Bed Mobility Rolling Right: 5: Supervision;With rail Right Sidelying to Sit: 5: Supervision;HOB flat;With rails Details for Bed Mobility Assistance: supervision for safety due to air mattress (deflated once pt was sidelying) and to help manage multiple lines Transfers Sit to Stand: 5: Supervision;From bed;With upper extremity assist Stand to Sit: 5: Supervision;To chair/3-in-1;With upper extremity assist;With armrests Details for Transfer Assistance: min vc's for hand placement with transfers and for safety with back precautions Ambulation/Gait Ambulation/Gait Assistance: 5: Supervision Ambulation Distance (Feet): 250 Feet Assistive device: Other (Comment) (used IV pole) Ambulation/Gait Assistance Details: min cues for upright posture (mostly due to IV pole position, once it was adjusted to the side and not in front of pt her posture improved) Gait Pattern: Step-through pattern;Decreased stride length;Narrow base of support;Trunk flexed Gait velocity: very decreased gait speed      PT Goals Acute Rehab PT Goals PT Goal: Rolling Supine to Right Side - Progress: Progressing toward goal PT Goal: Supine/Side to Sit - Progress: Progressing toward goal PT Goal: Sit to Stand - Progress: Progressing toward  goal PT Goal: Stand to Sit - Progress: Progressing toward goal PT Goal: Ambulate - Progress: Progressing toward goal  Visit Information  Last PT Received On: 01/19/12 Assistance Needed: +1    Subjective Data  Subjective: Reports being up multiple times to bathroom and walking a second time yesterday with her sisiter. Agreeable to therapy today. Rates pain as 7/10.   Cognition  Overall Cognitive Status: Appears within functional limits for tasks assessed/performed Arousal/Alertness: Awake/alert Orientation Level: Appears intact for tasks assessed Behavior During Session: [facility] for tasks performed       End of Session PT - End of Session Equipment Utilized During Treatment: Gait belt Activity Tolerance: Patient tolerated treatment well Patient left: in chair;with call bell/phone within reach Nurse Communication: Mobility status   GP     Sallyanne Kuster 01/19/2012, 12:37 PM  Sallyanne Kuster, PTA Office- [phone]

## 2012-01-19 NOTE — Progress Notes (Signed)
Spoke to patient about the cane.  Told her it would not be covered by her insurance because she recently received both a cane and rolling walker within 5 years from [facility] and insurance will not pay for another one.  She said she will find one somewhere else.   Orlan Leavens [phone]

## 2012-01-20 MED ORDER — DIAZEPAM 5 MG PO TABS: 5.0000 mg | ORAL_TABLET | Freq: Three times a day (TID) | ORAL | Status: DC | PRN

## 2012-01-20 MED ORDER — OXYCODONE HCL 15 MG PO TABS: 15.0000 mg | ORAL_TABLET | ORAL | Status: DC | PRN

## 2012-01-20 MED ORDER — OXYCODONE HCL 5 MG PO TABS
15.0000 mg | ORAL_TABLET | ORAL | Status: DC | PRN
  Administered 2012-01-20 (×2): 15 mg via ORAL
  Filled 2012-01-20 (×2): qty 3

## 2012-01-20 MED ORDER — RIVAROXABAN 10 MG PO TABS: 10.0000 mg | ORAL_TABLET | Freq: Every day | ORAL | Status: DC

## 2012-01-20 NOTE — Progress Notes (Signed)
Seen in room # 5N25 this afternoReady for discharge.  See D?C summary for details.

## 2012-01-20 NOTE — Discharge Summary (Signed)
Dictation [phone]

## 2012-01-20 NOTE — Progress Notes (Signed)
Not seen yet today, but will discontinue PCA at this time.  Will substitute Norco with oxycodone

## 2012-01-20 NOTE — Progress Notes (Signed)
Physical Therapy Treatment Patient Details Name: CLEOTILDE SPADACCINI MRN: [id number] DOB: May 13, 1980 Today's Date: 01/20/2012 Time: 4098-1191 PT Time Calculation (min): 29 min  PT Assessment / Plan / Recommendation Comments on Treatment Session  pt presents with L3-5 ALIF.  pt moves slowly, but well.  Just difficulty with pain control.      Follow Up Recommendations  No PT follow up     Does the patient have the potential to tolerate intense rehabilitation     Barriers to Discharge        Equipment Recommendations  Cane    Recommendations for Other Services    Frequency Min 5X/week   Plan Discharge plan remains appropriate;Frequency remains appropriate    Precautions / Restrictions Precautions Precautions: Back;Fall Precaution Comments: pt able to state 3/3 back precautions Restrictions Weight Bearing Restrictions: No   Pertinent Vitals/Pain Indicates pain is "tolerable".    Mobility  Bed Mobility Bed Mobility: Rolling Right;Right Sidelying to Sit;Sitting - Scoot to Edge of Bed Rolling Right: 5: Supervision;With rail Right Sidelying to Sit: 5: Supervision;With rails Sitting - Scoot to Edge of Bed: 5: Supervision Details for Bed Mobility Assistance: S and set-up needed due to air mattress and need for step stool to exit bed.   Transfers Transfers: Sit to Stand;Stand to Sit Sit to Stand: 5: Supervision;With upper extremity assist;From bed Stand to Sit: 5: Supervision;With upper extremity assist;To chair/3-in-1 Details for Transfer Assistance: cues fopr UE use only.   Ambulation/Gait Ambulation/Gait Assistance: 5: Supervision Ambulation Distance (Feet): 300 Feet Assistive device:  (IV pole) Ambulation/Gait Assistance Details: pt moves slowly and requests to hold IV pole.   Gait Pattern: Step-through pattern;Decreased stride length;Narrow base of support;Trunk flexed Stairs: No Wheelchair Mobility Wheelchair Mobility: No    Exercises     PT Diagnosis:    PT  Problem List:   PT Treatment Interventions:     PT Goals Acute Rehab PT Goals Time For Goal Achievement: 01/25/12 PT Goal: Rolling Supine to Right Side - Progress: Progressing toward goal PT Goal: Rolling Supine to Left Side - Progress: Progressing toward goal PT Goal: Sit to Stand - Progress: Progressing toward goal PT Goal: Stand to Sit - Progress: Progressing toward goal PT Goal: Ambulate - Progress: Progressing toward goal  Visit Information  Last PT Received On: 01/20/12 Assistance Needed: +1    Subjective Data  Subjective: pt notes doing well today and that she ambulated last night with Nsg.     Cognition  Overall Cognitive Status: Appears within functional limits for tasks assessed/performed Arousal/Alertness: Awake/alert Orientation Level: Appears intact for tasks assessed Behavior During Session: [facility] for tasks performed    Balance  Balance Balance Assessed: No  End of Session PT - End of Session Equipment Utilized During Treatment: Gait belt Activity Tolerance: Patient tolerated treatment well Patient left: in chair;with call bell/phone within reach Nurse Communication: Mobility status   GP     Sunny Schlein, [place] [phone] 01/20/2012, 10:39 AM

## 2012-01-23 NOTE — Discharge Summary (Signed)
NAMEGEORGIANN, [name] NO.:  [id number]  MEDICAL RECORD NO.:  [id number]  LOCATION:                                 FACILITY:  PHYSICIAN:  Nelda Severe, MD      DATE OF BIRTH:  March 29, 1980  DATE OF ADMISSION:  01/17/2012 DATE OF DISCHARGE:                              DISCHARGE SUMMARY   DISCHARGE DIAGNOSIS:  Status post thoracolumbar fusion for scoliosis, pseudoarthrosis L3-4 and L4-5.  This woman was admitted for planned second-stage anterior interbody fusions at L3-4 and L4-5, status post repair of pseudoarthrosis with revision of posterior instrumentation.  On the day of admission, she was taken to the operating room where anterior interbody fusions were carried at L3-4-L4-5 without complication.  Postoperatively, she had a good deal of pain, but it has progressed satisfactory with physical therapy.  Her wound is dry without drainage.  She is ambulatory.  She will be discharged on oxycodone IR, diazepam 10 mg, and Xarelto for the next 1 month, has prophylaxis against deep vein thrombophlebitis in view of the fact that her common iliac vein on the left side was retracted during the surgery.  She will return to the office in approximately 1 weeks' time for removal of staples.  Pain control is satisfactory at the present time.  CONDITION ON DISCHARGE:  Stable, ambulatory.     Nelda Severe, MD     MT/MEDQ  D:  01/20/2012  T:  01/21/2012  Job:  [id number]

## 2012-06-08 ENCOUNTER — Encounter: Payer: Self-pay | Admitting: Obstetrics & Gynecology

## 2012-06-12 ENCOUNTER — Encounter: Payer: Self-pay | Admitting: Obstetrics

## 2012-06-12 ENCOUNTER — Ambulatory Visit (INDEPENDENT_AMBULATORY_CARE_PROVIDER_SITE_OTHER): Payer: Medicare Other | Admitting: Obstetrics

## 2012-06-12 VITALS — BP 124/86 | HR 75 | Temp 97.9°F | Wt 172.0 lb

## 2012-06-12 DIAGNOSIS — L739 Follicular disorder, unspecified: Secondary | ICD-10-CM | POA: Insufficient documentation

## 2012-06-12 DIAGNOSIS — K219 Gastro-esophageal reflux disease without esophagitis: Secondary | ICD-10-CM | POA: Insufficient documentation

## 2012-06-12 DIAGNOSIS — L738 Other specified follicular disorders: Secondary | ICD-10-CM

## 2012-06-12 MED ORDER — AMOXICILLIN-POT CLAVULANATE 500-125 MG PO TABS: 1.0000 | ORAL_TABLET | Freq: Three times a day (TID) | ORAL | Status: DC

## 2012-06-12 MED ORDER — ESOMEPRAZOLE MAGNESIUM 40 MG PO CPDR: 40.0000 mg | DELAYED_RELEASE_CAPSULE | Freq: Every day | ORAL | Status: DC

## 2012-06-12 NOTE — Progress Notes (Signed)
Subjective:     [name] is a 32 y.o. female here for problem visit.  Current complaints: raised painful bump on right labia x 1 week.  Personal health questionnaire reviewed: yes.   Gynecologic History Patient's last menstrual period was 05/15/2012. Contraception: none    The following portions of the patient's history were reviewed and updated as appropriate: allergies, current medications, past family history, past medical history, past social history, past surgical history and problem list.  Review of Systems Pertinent items are noted in HPI.    Objective:    General appearance: alert and no distress Pelvic: Right labia.  Indurated area around shaved hair, tender.    Assessment:    Folliculitis.  GERD   Plan:    Education reviewed: Shaving and folliculitis.. Augmentin Rx.   Nexium Rx.

## 2012-06-12 NOTE — Patient Instructions (Signed)
Folliculitis GERD

## 2012-08-14 ENCOUNTER — Emergency Department (HOSPITAL_BASED_OUTPATIENT_CLINIC_OR_DEPARTMENT_OTHER)
Admission: EM | Admit: 2012-08-14 | Discharge: 2012-08-14 | Disposition: A | Discharge status: Home / Self Care | Payer: Medicare Other | Attending: Emergency Medicine | Admitting: Emergency Medicine

## 2012-08-14 ENCOUNTER — Encounter (HOSPITAL_BASED_OUTPATIENT_CLINIC_OR_DEPARTMENT_OTHER): Payer: Self-pay | Admitting: *Deleted

## 2012-08-14 DIAGNOSIS — F17200 Nicotine dependence, unspecified, uncomplicated: Secondary | ICD-10-CM | POA: Insufficient documentation

## 2012-08-14 DIAGNOSIS — F329 Major depressive disorder, single episode, unspecified: Secondary | ICD-10-CM | POA: Insufficient documentation

## 2012-08-14 DIAGNOSIS — I509 Heart failure, unspecified: Secondary | ICD-10-CM | POA: Insufficient documentation

## 2012-08-14 DIAGNOSIS — K219 Gastro-esophageal reflux disease without esophagitis: Secondary | ICD-10-CM | POA: Insufficient documentation

## 2012-08-14 DIAGNOSIS — Z8739 Personal history of other diseases of the musculoskeletal system and connective tissue: Secondary | ICD-10-CM | POA: Insufficient documentation

## 2012-08-14 DIAGNOSIS — G8929 Other chronic pain: Secondary | ICD-10-CM | POA: Insufficient documentation

## 2012-08-14 DIAGNOSIS — H109 Unspecified conjunctivitis: Secondary | ICD-10-CM | POA: Insufficient documentation

## 2012-08-14 DIAGNOSIS — F3289 Other specified depressive episodes: Secondary | ICD-10-CM | POA: Insufficient documentation

## 2012-08-14 DIAGNOSIS — F411 Generalized anxiety disorder: Secondary | ICD-10-CM | POA: Insufficient documentation

## 2012-08-14 DIAGNOSIS — Z79899 Other long term (current) drug therapy: Secondary | ICD-10-CM | POA: Insufficient documentation

## 2012-08-14 DIAGNOSIS — J45909 Unspecified asthma, uncomplicated: Secondary | ICD-10-CM | POA: Insufficient documentation

## 2012-08-14 DIAGNOSIS — H538 Other visual disturbances: Secondary | ICD-10-CM | POA: Insufficient documentation

## 2012-08-14 DIAGNOSIS — G43909 Migraine, unspecified, not intractable, without status migrainosus: Secondary | ICD-10-CM | POA: Insufficient documentation

## 2012-08-14 DIAGNOSIS — Z8709 Personal history of other diseases of the respiratory system: Secondary | ICD-10-CM | POA: Insufficient documentation

## 2012-08-14 DIAGNOSIS — Z8701 Personal history of pneumonia (recurrent): Secondary | ICD-10-CM | POA: Insufficient documentation

## 2012-08-14 DIAGNOSIS — Z8541 Personal history of malignant neoplasm of cervix uteri: Secondary | ICD-10-CM | POA: Insufficient documentation

## 2012-08-14 MED ORDER — FLUORESCEIN SODIUM 1 MG OP STRP
ORAL_STRIP | OPHTHALMIC | Status: AC
  Filled 2012-08-14: qty 1

## 2012-08-14 MED ORDER — AZITHROMYCIN 1 G PO PACK
2.0000 g | PACK | Freq: Once | ORAL | Status: AC
  Administered 2012-08-14: 2 g via ORAL
  Filled 2012-08-14: qty 2

## 2012-08-14 MED ORDER — TETRACAINE HCL 0.5 % OP SOLN
OPHTHALMIC | Status: AC
  Filled 2012-08-14: qty 2

## 2012-08-14 NOTE — ED Notes (Signed)
Patient states since Jun 29, 2012 she had pink eye and was seen and treated by Dr. Karleen Hampshire several times for the same.  States she continues to refill the prescription and now had drainage and pink sclera in both eyes.

## 2012-08-14 NOTE — ED Provider Notes (Signed)
History    CSN: [id number] Arrival date & time 08/14/12  1023  First MD Initiated Contact with Patient 08/14/12 1050     Chief Complaint  Patient presents with  . Conjunctivitis   (Consider location/radiation/quality/duration/timing/severity/associated sxs/prior Treatment) HPI  This is a 32 year old female comes in today complaining of conjunctivitis. She states that she had a right-sided conjunctivitis and was seen by her ophthalmologist. She was given eyedrops. This has decreased but she now has it in the other eye it has been ongoing despite eye drop use for over 2 weeks. She voices concerns of a chlamydial conjunctivitis. No known genital eye contact. She denies history of STD. She does not have any active vaginal discharge. She states her vision has been slightly blurry. She has not had fever. She denies pain she has had discharge from the eye. Past Medical History  Diagnosis Date  . Complication of anesthesia   . Difficult intubation     "needs small tube"  . PONV (postoperative nausea and vomiting)   . Family history of anesthesia complication     Nausea  . Respiratory failure 11/2010; 09/2011  . Chronic back pain   . CHF (congestive heart failure) 2006    "with pregnancy, toxcemia "  not seeing a cardiologist now.  . Asthma     "very badly" (01/17/2012)  . Pneumonia     "~ once/yr" (01/17/2012)  . Chronic bronchitis     "@ least once/yr" (01/17/2012)  . Exertional dyspnea     Asthma  . GERD (gastroesophageal reflux disease)   . Migraines   . Degenerative disk disease   . Anxiety   . Cervical cancer 1999; 2000   Past Surgical History  Procedure Laterality Date  . Spinal fusion  10/04/2011    Procedure: FUSION POSTERIOR SPINAL MULTILEVEL;  Surgeon: Mat Carne, MD;  [facility] OR;  Service: Orthopedics;  Laterality: Bilateral;  repair lumbar pseudoarthritis, revision segmental fixation, L3-L4, L4-L5  . Cervical cone biopsy  1999; 2000  . Adenoidectomy,  tonsillectomy and myringotomy with tube placement  1983?  Marland Kitchen Tonsillectomy    . Knee arthroscopy  2004 X 2    "left X 2" (01/17/2012)  . Anterior lumbar fusion  01/17/2012  . Lumbar disc surgery  06/1998    "L4-5" (01/17/2012)  . Lumbar fusion  08/1998; 2010; 2012; 06/2011    "fusion cage; put rods in; reanchored screws; rod broke" (01/17/2012)  . Anterior lumbar fusion  01/17/2012    Procedure: ANTERIOR LUMBAR FUSION 2 LEVELS;  Surgeon: Mat Carne, MD;  Location: [facility] OR;  Service: Orthopedics;  Laterality: Bilateral;  Anterior Lumbar 3-4, Lumbar 4-5 fusion with bone graft harvest   Family History  Problem Relation Age of Onset  . Heart disease Father   . Hypertension Father   . Diabetes Father   . Deep vein thrombosis Father   . Cancer     History  Substance Use Topics  . Smoking status: Current Every Day Smoker -- 0.50 packs/day for 5 years    Types: Cigarettes  . Smokeless tobacco: Never Used  . Alcohol Use: Yes     Comment: 01/17/2012 "a drink at the holidays"   OB History   Grav Para Term Preterm Abortions TAB SAB Ect Mult Living   1              Review of Systems  All other systems reviewed and are negative.    Allergies  Review of patient's allergies indicates no  known allergies.  Home Medications   Current Outpatient Rx  Name  Route  Sig  Dispense  Refill  . neomycin-polymyxin-dexamethasone (MAXITROL) 0.1 % ophthalmic suspension      1 drop 4 (four) times daily.         Marland Kitchen albuterol (PROVENTIL HFA;VENTOLIN HFA) 108 (90 BASE) MCG/ACT inhaler   Inhalation   Inhale 2 puffs into the lungs every 6 (six) hours as needed for wheezing.   1 Inhaler   2   . amoxicillin-clavulanate (AUGMENTIN) 500-125 MG per tablet   Oral   Take 1 tablet (500 mg total) by mouth 3 (three) times daily.   30 tablet   1   . budesonide-formoterol (SYMBICORT) 160-4.5 MCG/ACT inhaler   Inhalation   Inhale 2 puffs into the lungs 2 (two) times daily.   1 Inhaler   1   . diazepam  (VALIUM) 5 MG tablet   Oral   Take 1-2 tablets (5-10 mg total) by mouth every 8 (eight) hours as needed for anxiety (prn for muscl spasm).   100 tablet   1   . esomeprazole (NEXIUM) 40 MG capsule   Oral   Take 1 capsule (40 mg total) by mouth daily before breakfast.   30 capsule   11   . HYDROcodone-acetaminophen (NORCO) 10-325 MG per tablet   Oral   Take 1-2 tablets by mouth every 6 (six) hours as needed for pain.         . rivaroxaban (XARELTO) 10 MG TABS tablet   Oral   Take 1 tablet (10 mg total) by mouth daily with supper.   30 tablet   0   . SUMAtriptan (IMITREX) 25 MG tablet   Oral   Take 25 mg by mouth every 2 (two) hours as needed. For migraines          BP 123/77  Pulse 90  Temp(Src) 98.5 F (36.9 C) (Oral)  Resp 16  Ht 5\' 4"  (1.626 m)  Wt 170 lb (77.111 kg)  BMI 29.17 kg/m2  SpO2 99%  LMP 08/03/2012 Physical Exam  Nursing note and vitals reviewed. Constitutional: She is oriented to person, place, and time. She appears well-developed and well-nourished.  HENT:  Head: Normocephalic and atraumatic.  Right Ear: External ear normal.  Left Ear: External ear normal.  No preauricular adenopathy is noted  Eyes: EOM are normal. Pupils are equal, round, and reactive to light. Right eye exhibits no discharge. Left eye exhibits discharge.  Bilateral conjunctival injection with left worse than right.  Neck: Normal range of motion. Neck supple.  Cardiovascular: Normal rate and regular rhythm.   Pulmonary/Chest: Effort normal and breath sounds normal.  Abdominal: Soft. Bowel sounds are normal.  Musculoskeletal: Normal range of motion.  Neurological: She is alert and oriented to person, place, and time.  Skin: Skin is warm and dry.  Psychiatric: She has a normal mood and affect.    ED Course  Procedures (including critical care time) Labs Reviewed - No data to display No results found. No diagnosis found.  MDM  Patient treated with Zithromax for possible  chlamydial conjunctivitis she is to followup with her ophthalmologist.  Hilario Quarry, MD 08/15/12 2113

## 2013-03-19 ENCOUNTER — Emergency Department (HOSPITAL_BASED_OUTPATIENT_CLINIC_OR_DEPARTMENT_OTHER)
Admission: EM | Admit: 2013-03-19 | Discharge: 2013-03-19 | Disposition: A | Discharge status: Home / Self Care | Payer: Medicare HMO | Attending: Emergency Medicine | Admitting: Emergency Medicine

## 2013-03-19 ENCOUNTER — Emergency Department (HOSPITAL_BASED_OUTPATIENT_CLINIC_OR_DEPARTMENT_OTHER): Payer: Medicare HMO

## 2013-03-19 ENCOUNTER — Encounter (HOSPITAL_BASED_OUTPATIENT_CLINIC_OR_DEPARTMENT_OTHER): Payer: Self-pay | Admitting: Emergency Medicine

## 2013-03-19 DIAGNOSIS — S99929A Unspecified injury of unspecified foot, initial encounter: Secondary | ICD-10-CM

## 2013-03-19 DIAGNOSIS — Z981 Arthrodesis status: Secondary | ICD-10-CM | POA: Insufficient documentation

## 2013-03-19 DIAGNOSIS — K219 Gastro-esophageal reflux disease without esophagitis: Secondary | ICD-10-CM | POA: Insufficient documentation

## 2013-03-19 DIAGNOSIS — Y939 Activity, unspecified: Secondary | ICD-10-CM | POA: Insufficient documentation

## 2013-03-19 DIAGNOSIS — I509 Heart failure, unspecified: Secondary | ICD-10-CM | POA: Insufficient documentation

## 2013-03-19 DIAGNOSIS — M549 Dorsalgia, unspecified: Secondary | ICD-10-CM

## 2013-03-19 DIAGNOSIS — J45909 Unspecified asthma, uncomplicated: Secondary | ICD-10-CM | POA: Insufficient documentation

## 2013-03-19 DIAGNOSIS — W19XXXA Unspecified fall, initial encounter: Secondary | ICD-10-CM | POA: Insufficient documentation

## 2013-03-19 DIAGNOSIS — S8990XA Unspecified injury of unspecified lower leg, initial encounter: Secondary | ICD-10-CM | POA: Insufficient documentation

## 2013-03-19 DIAGNOSIS — G43909 Migraine, unspecified, not intractable, without status migrainosus: Secondary | ICD-10-CM | POA: Insufficient documentation

## 2013-03-19 DIAGNOSIS — G8929 Other chronic pain: Secondary | ICD-10-CM | POA: Insufficient documentation

## 2013-03-19 DIAGNOSIS — Y929 Unspecified place or not applicable: Secondary | ICD-10-CM | POA: Insufficient documentation

## 2013-03-19 DIAGNOSIS — Z9889 Other specified postprocedural states: Secondary | ICD-10-CM | POA: Insufficient documentation

## 2013-03-19 DIAGNOSIS — IMO0002 Reserved for concepts with insufficient information to code with codable children: Secondary | ICD-10-CM | POA: Insufficient documentation

## 2013-03-19 DIAGNOSIS — F17200 Nicotine dependence, unspecified, uncomplicated: Secondary | ICD-10-CM | POA: Insufficient documentation

## 2013-03-19 DIAGNOSIS — Z792 Long term (current) use of antibiotics: Secondary | ICD-10-CM | POA: Insufficient documentation

## 2013-03-19 DIAGNOSIS — S99919A Unspecified injury of unspecified ankle, initial encounter: Secondary | ICD-10-CM

## 2013-03-19 DIAGNOSIS — Z79899 Other long term (current) drug therapy: Secondary | ICD-10-CM | POA: Insufficient documentation

## 2013-03-19 DIAGNOSIS — S298XXA Other specified injuries of thorax, initial encounter: Secondary | ICD-10-CM | POA: Insufficient documentation

## 2013-03-19 DIAGNOSIS — Z8701 Personal history of pneumonia (recurrent): Secondary | ICD-10-CM | POA: Insufficient documentation

## 2013-03-19 DIAGNOSIS — Z8739 Personal history of other diseases of the musculoskeletal system and connective tissue: Secondary | ICD-10-CM | POA: Insufficient documentation

## 2013-03-19 DIAGNOSIS — Z8541 Personal history of malignant neoplasm of cervix uteri: Secondary | ICD-10-CM | POA: Insufficient documentation

## 2013-03-19 DIAGNOSIS — F411 Generalized anxiety disorder: Secondary | ICD-10-CM | POA: Insufficient documentation

## 2013-03-19 MED ORDER — HYDROCODONE-ACETAMINOPHEN 5-325 MG PO TABS: 2.0000 | ORAL_TABLET | ORAL | Status: DC | PRN

## 2013-03-19 MED ORDER — HYDROCODONE-ACETAMINOPHEN 5-325 MG PO TABS
2.0000 | ORAL_TABLET | Freq: Once | ORAL | Status: AC
  Administered 2013-03-19: 2 via ORAL
  Filled 2013-03-19: qty 2

## 2013-03-19 NOTE — ED Notes (Signed)
Pt c/o right leg numbness "chronic" and has caused her to fall 3 x in past week. Pt c/o pain to back, right hip and shooting down legs.

## 2013-03-19 NOTE — Discharge Instructions (Signed)
Hydrocodone as prescribed as needed for pain.  You should arrange followup with your neurosurgeon to discuss your current issues and discuss whether further surgery or imaging is indicated.   Chronic Back Pain  When back pain lasts longer than 3 months, it is called chronic back pain.People with chronic back pain often go through certain periods that are more intense (flare-ups).  CAUSES Chronic back pain can be caused by wear and tear (degeneration) on different structures in your back. These structures include:  The bones of your spine (vertebrae) and the joints surrounding your spinal cord and nerve roots (facets).  The strong, fibrous tissues that connect your vertebrae (ligaments). Degeneration of these structures may result in pressure on your nerves. This can lead to constant pain. HOME CARE INSTRUCTIONS  Avoid bending, heavy lifting, prolonged sitting, and activities which make the problem worse.  Take brief periods of rest throughout the day to reduce your pain. Lying down or standing usually is better than sitting while you are resting.  Take over-the-counter or prescription medicines only as directed by your caregiver. SEEK IMMEDIATE MEDICAL CARE IF:   You have weakness or numbness in one of your legs or feet.  You have trouble controlling your bladder or bowels.  You have nausea, vomiting, abdominal pain, shortness of breath, or fainting. Document Released: 03/10/2004 Document Revised: 04/25/2011 Document Reviewed: 01/15/2011 [facility] Patient Information 2014 [place], [place].

## 2013-03-19 NOTE — ED Provider Notes (Signed)
CSN: [id number]     Arrival date & time 03/19/13  0746 History   First MD Initiated Contact with Patient 03/19/13 [phone]     Chief Complaint  Patient presents with  . Fall   (Consider location/radiation/quality/duration/timing/severity/associated sxs/prior Treatment) HPI Comments: Patient is a 33 year old female with history of chronic back problems. She's apparently had multiple surgeries and has stabilizing rods in her back. Her surgeries have been done by Dr. Arnoldo Morale and Dr. Marcial Pacas from neurosurgery. She states that she's been having pain in her back and states that her right foot has been "cold" for the past several weeks. This has caused her to fall. She states that she fell and injured her knee and chest. She reports having severe pain in her back but denies any bowel or bladder complaints the  Patient is a 33 y.o. female presenting with fall. The history is provided by the patient.  Fall This is a new problem. The current episode started yesterday. The problem occurs constantly. The problem has not changed since onset.Associated symptoms include chest pain. Nothing aggravates the symptoms. Nothing relieves the symptoms. She has tried nothing for the symptoms. The treatment provided no relief.    Past Medical History  Diagnosis Date  . Complication of anesthesia   . Difficult intubation     "needs small tube"  . PONV (postoperative nausea and vomiting)   . Family history of anesthesia complication     Nausea  . Respiratory failure 11/2010; 09/2011  . Chronic back pain   . CHF (congestive heart failure) 2006    "with pregnancy, toxcemia "  not seeing a cardiologist now.  . Asthma     "very badly" (01/17/2012)  . Pneumonia     "~ once/yr" (01/17/2012)  . Chronic bronchitis     "@ least once/yr" (01/17/2012)  . Exertional dyspnea     Asthma  . GERD (gastroesophageal reflux disease)   . Migraines   . Degenerative disk disease   . Anxiety   . Cervical cancer 1999; 2000   Past  Surgical History  Procedure Laterality Date  . Spinal fusion  10/04/2011    Procedure: FUSION POSTERIOR SPINAL MULTILEVEL;  Surgeon: Lowella Grip, MD;  Location: [place];  Service: Orthopedics;  Laterality: Bilateral;  repair lumbar pseudoarthritis, revision segmental fixation, L3-L4, L4-L5  . Cervical cone biopsy  1999; 2000  . Adenoidectomy, tonsillectomy and myringotomy with tube placement  1983?  Marland Kitchen Tonsillectomy    . Knee arthroscopy  2004 X 2    "left X 2" (01/17/2012)  . Anterior lumbar fusion  01/17/2012  . Lumbar disc surgery  06/1998    "L4-5" (01/17/2012)  . Lumbar fusion  08/1998; 2010; 2012; 06/2011    "fusion cage; put rods in; reanchored screws; rod broke" (01/17/2012)  . Anterior lumbar fusion  01/17/2012    Procedure: ANTERIOR LUMBAR FUSION 2 LEVELS;  Surgeon: Lowella Grip, MD;  Location: [place];  Service: Orthopedics;  Laterality: Bilateral;  Anterior Lumbar 3-4, Lumbar 4-5 fusion with bone graft harvest   Family History  Problem Relation Age of Onset  . Heart disease Father   . Hypertension Father   . Diabetes Father   . Deep vein thrombosis Father   . Cancer     History  Substance Use Topics  . Smoking status: Current Every Day Smoker -- 0.50 packs/day for 5 years    Types: Cigarettes  . Smokeless tobacco: Never Used  . Alcohol Use: Yes     Comment:  01/17/2012 "a drink at the holidays"   OB History   Grav Para Term Preterm Abortions TAB SAB Ect Mult Living   1              Review of Systems  Cardiovascular: Positive for chest pain.  All other systems reviewed and are negative.    Allergies  Review of patient's allergies indicates no known allergies.  Home Medications   Current Outpatient Rx  Name  Route  Sig  Dispense  Refill  . EXPIRED: albuterol (PROVENTIL HFA;VENTOLIN HFA) 108 (90 BASE) MCG/ACT inhaler   Inhalation   Inhale 2 puffs into the lungs every 6 (six) hours as needed for wheezing.   1 Inhaler   2   . amoxicillin-clavulanate  (AUGMENTIN) 500-125 MG per tablet   Oral   Take 1 tablet (500 mg total) by mouth 3 (three) times daily.   30 tablet   1   . EXPIRED: budesonide-formoterol (SYMBICORT) 160-4.5 MCG/ACT inhaler   Inhalation   Inhale 2 puffs into the lungs 2 (two) times daily.   1 Inhaler   1   . diazepam (VALIUM) 5 MG tablet   Oral   Take 1-2 tablets (5-10 mg total) by mouth every 8 (eight) hours as needed for anxiety (prn for muscl spasm).   100 tablet   1   . esomeprazole (NEXIUM) 40 MG capsule   Oral   Take 1 capsule (40 mg total) by mouth daily before breakfast.   30 capsule   11   . HYDROcodone-acetaminophen (NORCO) 10-325 MG per tablet   Oral   Take 1-2 tablets by mouth every 6 (six) hours as needed for pain.         Marland Kitchen neomycin-polymyxin-dexamethasone (MAXITROL) 0.1 % ophthalmic suspension      1 drop 4 (four) times daily.         . rivaroxaban (XARELTO) 10 MG TABS tablet   Oral   Take 1 tablet (10 mg total) by mouth daily with supper.   30 tablet   0   . SUMAtriptan (IMITREX) 25 MG tablet   Oral   Take 25 mg by mouth every 2 (two) hours as needed. For migraines          BP 141/79  Pulse 110  Temp(Src) 98.3 F (36.8 C) (Oral)  Resp 16  Ht 5\' 3"  (1.6 m)  Wt 175 lb (79.379 kg)  BMI 31.01 kg/m2  SpO2 100%  LMP 02/19/2013 Physical Exam  Nursing note and vitals reviewed. Constitutional: She is oriented to person, place, and time. She appears well-developed and well-nourished. No distress.  HENT:  Head: Normocephalic and atraumatic.  Neck: Normal range of motion. Neck supple.  Cardiovascular: Normal rate and regular rhythm.  Exam reveals no gallop and no friction rub.   No murmur heard. Pulmonary/Chest: Effort normal and breath sounds normal. No respiratory distress. She has no wheezes.  There is tenderness to palpation of the right lateral rib cage. There is no crepitus. Breath sounds are clear and equal.  Abdominal: Soft. Bowel sounds are normal. She exhibits no  distension. There is no tenderness.  Musculoskeletal: Normal range of motion.  There is tenderness to palpation throughout the soft tissues of the lumbar region.  Neurological: She is alert and oriented to person, place, and time.  Deep tendon reflexes are trace and equal in the bilateral lower extremities. Strength is 5 out of 5 and she is ambulatory without difficulty.  Skin: Skin is warm and dry. She  is not diaphoretic.    ED Course  Procedures (including critical care time) Labs Review Labs Reviewed - No data to display Imaging Review No results found.    MDM  No diagnosis found. Patient with history of back problems with multiple surgeries and chronic pain. She presents today with complaints of worsening pain in her back and stating that her foot has felt "cold" for the past week. She states this has caused her to fall. She injured her ribs and knee. Rib films are unremarkable for fracture or pneumothorax and she ambulates on the knee without difficulty and I do not feel justifies an x-ray. X-rays of her back revealed hardware in a similar configuration to the prior December of 2013 study. She has no deficits in strength or reflexes and I am unable to appreciate any coldness to her foot. Dorsalis pedis and posterior tibial pulse are easily palpable and the foot appears well-perfused. I am uncertain as to why she feels her foot is cold however there appears to be no emergent process. She has received prescriptions for hydrocodone for her primary Dr. in the past and upon reviewing the prescription database, it does not appears though she has filled a prescription and some time. I will prescribe more for hydrocodone and advised her to followup with her neurosurgeon in the near future if her symptoms persist and do not improve. I see no indication for emergent surgery or further imaging.    Veryl Speak, MD 03/19/13 [phone]

## 2013-03-27 ENCOUNTER — Other Ambulatory Visit: Payer: Self-pay | Admitting: Obstetrics

## 2013-03-27 ENCOUNTER — Other Ambulatory Visit: Payer: Medicare Other

## 2013-03-27 ENCOUNTER — Encounter: Payer: Self-pay | Admitting: Obstetrics

## 2013-03-27 ENCOUNTER — Other Ambulatory Visit (INDEPENDENT_AMBULATORY_CARE_PROVIDER_SITE_OTHER): Payer: Medicare Other

## 2013-03-27 DIAGNOSIS — Z3201 Encounter for pregnancy test, result positive: Secondary | ICD-10-CM

## 2013-03-27 DIAGNOSIS — M4120 Other idiopathic scoliosis, site unspecified: Secondary | ICD-10-CM

## 2013-03-27 DIAGNOSIS — M419 Scoliosis, unspecified: Secondary | ICD-10-CM | POA: Insufficient documentation

## 2013-03-27 NOTE — Progress Notes (Signed)
Patient in office today for a confirmation of pregnancy. Patient states she had 3 positive home pregnancy test.   Pregnancy test in office is positive. Patient is concerned because she has had 6 back surgeries. Patient states she has rods in her back. Patient is concerned about the medications she is currently taking. She is on Valium 5 mg 2 tablets every 8 hours as needed and Hydrocodone 10-325 mg 1-2 tablets every 4-6 hours. Patient is concerned her body may not be able to handle the pregnancy.  A/P:  5 weeks.  H/O scoliosis.  Referred to Ortho.  F/U prn.

## 2013-04-10 ENCOUNTER — Encounter: Payer: Self-pay | Admitting: Obstetrics

## 2013-04-10 ENCOUNTER — Ambulatory Visit (INDEPENDENT_AMBULATORY_CARE_PROVIDER_SITE_OTHER): Payer: Medicaid Other | Admitting: Obstetrics

## 2013-04-10 VITALS — BP 123/78

## 2013-04-10 DIAGNOSIS — E559 Vitamin D deficiency, unspecified: Secondary | ICD-10-CM

## 2013-04-10 DIAGNOSIS — L739 Follicular disorder, unspecified: Secondary | ICD-10-CM

## 2013-04-10 DIAGNOSIS — N760 Acute vaginitis: Secondary | ICD-10-CM

## 2013-04-10 DIAGNOSIS — F070 Personality change due to known physiological condition: Secondary | ICD-10-CM

## 2013-04-10 DIAGNOSIS — O09529 Supervision of elderly multigravida, unspecified trimester: Secondary | ICD-10-CM | POA: Insufficient documentation

## 2013-04-10 DIAGNOSIS — Z1321 Encounter for screening for nutritional disorder: Secondary | ICD-10-CM

## 2013-04-10 DIAGNOSIS — K219 Gastro-esophageal reflux disease without esophagitis: Secondary | ICD-10-CM

## 2013-04-10 DIAGNOSIS — Z113 Encounter for screening for infections with a predominantly sexual mode of transmission: Secondary | ICD-10-CM

## 2013-04-10 DIAGNOSIS — Z3201 Encounter for pregnancy test, result positive: Secondary | ICD-10-CM

## 2013-04-10 DIAGNOSIS — Z3480 Encounter for supervision of other normal pregnancy, unspecified trimester: Secondary | ICD-10-CM

## 2013-04-10 LAB — POCT URINALYSIS DIPSTICK
BILIRUBIN UA: NEGATIVE
Blood, UA: NEGATIVE
GLUCOSE UA: NEGATIVE
KETONES UA: NEGATIVE
LEUKOCYTES UA: NEGATIVE
Nitrite, UA: NEGATIVE
PROTEIN UA: NEGATIVE
SPEC GRAV UA: 1.01
Urobilinogen, UA: NEGATIVE
pH, UA: 6

## 2013-04-10 MED ORDER — CLINDAMYCIN PHOSPHATE 1 % EX GEL: Freq: Two times a day (BID) | CUTANEOUS | Status: DC

## 2013-04-10 MED ORDER — OMEPRAZOLE 20 MG PO CPDR: 20.0000 mg | DELAYED_RELEASE_CAPSULE | Freq: Two times a day (BID) | ORAL | Status: DC

## 2013-04-10 NOTE — Addendum Note (Signed)
Addended by: Lewie Loron D on: 04/10/2013 05:11 PM   Modules accepted: Orders

## 2013-04-10 NOTE — Progress Notes (Signed)
Pulse 94  Subjective:    [name] is being seen today for her first obstetrical visit.  This is not a planned pregnancy. She is at [redacted]w[redacted]d gestation. Her obstetrical history is significant for managing back pain. Relationship with FOB: spouse, living together. Patient does intend to breast feed. Pregnancy history fully reviewed. Pt would like to discuss medications and what she should continue to take.  Pt states that she is also having severe headaches and would like to know if she can take Imitrex.  Pt also needs medication for nausea.    Menstrual History: OB History   Grav Para Term Preterm Abortions TAB SAB Ect Mult Living   3 1 1  1  1   1       Menarche age: not asked  Patient's last menstrual period was 02/16/2013.    The following portions of the patient's history were reviewed and updated as appropriate: allergies, current medications, past family history, past medical history, past social history, past surgical history and problem list.  Review of Systems Pertinent items are noted in HPI.    Objective:    Abdomen: normal findings: soft, non-tender Pelvic: cervix normal in appearance, external genitalia normal, no adnexal masses or tenderness, no cervical motion tenderness, rectovaginal septum normal, uterus normal size, shape, and consistency and vagina normal without discharge    Assessment:    Pregnancy at [redacted]w[redacted]d weeks    Plan:    Initial labs drawn. Prenatal vitamins.  Counseling provided regarding continued use of seat belts, cessation of alcohol consumption, smoking or use of illicit drugs; infection precautions i.e., influenza/TDAP immunizations, toxoplasmosis,CMV, parvovirus, listeria and varicella; workplace safety, exercise during pregnancy; routine dental care, safe medications, sexual activity, hot tubs, saunas, pools, travel, caffeine use, fish and methlymercury, potential toxins, hair treatments, varicose veins Weight gain recommendations per IOM  guidelines reviewed: underweight/BMI< 18.5--> gain 28 - 40 lbs; normal weight/BMI 18.5 - 24.9--> gain 25 - 35 lbs; overweight/BMI 25 - 29.9--> gain 15 - 25 lbs; obese/BMI >30->gain  11 - 20 lbs Problem list reviewed and updated. FIRST/CF mutation testing/NIPT/QUAD SCREEN discussed: requested. Role of ultrasound in pregnancy discussed; fetal survey: requested. Amniocentesis discussed: declined. VBAC calculator score: VBAC consent form provided Follow up in 4 weeks. 50% of 20 min visit spent on counseling and coordination of care.

## 2013-04-10 NOTE — Addendum Note (Signed)
Addended by: Jiles Garter on: 04/10/2013 05:42 PM   Modules accepted: Orders

## 2013-04-11 LAB — OBSTETRIC PANEL
ANTIBODY SCREEN: NEGATIVE
Basophils Absolute: 0 10*3/uL (ref 0.0–0.1)
Basophils Relative: 0 % (ref 0–1)
EOS ABS: 0.5 10*3/uL (ref 0.0–0.7)
Eosinophils Relative: 5 % (ref 0–5)
HEMATOCRIT: 40.8 % (ref 36.0–46.0)
HEMOGLOBIN: 13.9 g/dL (ref 12.0–15.0)
HEP B S AG: NEGATIVE
LYMPHS ABS: 2.1 10*3/uL (ref 0.7–4.0)
Lymphocytes Relative: 22 % (ref 12–46)
MCH: 32.5 pg (ref 26.0–34.0)
MCHC: 34.1 g/dL (ref 30.0–36.0)
MCV: 95.3 fL (ref 78.0–100.0)
MONO ABS: 0.6 10*3/uL (ref 0.1–1.0)
MONOS PCT: 6 % (ref 3–12)
NEUTROS PCT: 67 % (ref 43–77)
Neutro Abs: 6.5 10*3/uL (ref 1.7–7.7)
Platelets: 305 10*3/uL (ref 150–400)
RBC: 4.28 MIL/uL (ref 3.87–5.11)
RDW: 13.5 % (ref 11.5–15.5)
RH TYPE: NEGATIVE
Rubella: 0.74 Index (ref <0.90)
WBC: 9.7 10*3/uL (ref 4.0–10.5)

## 2013-04-11 LAB — WET PREP BY MOLECULAR PROBE
CANDIDA SPECIES: NEGATIVE
Gardnerella vaginalis: POSITIVE — AB
Trichomonas vaginosis: NEGATIVE

## 2013-04-11 LAB — PAP IG W/ RFLX HPV ASCU

## 2013-04-11 LAB — GC/CHLAMYDIA PROBE AMP
CT PROBE, AMP APTIMA: NEGATIVE
GC Probe RNA: NEGATIVE

## 2013-04-11 LAB — VARICELLA ZOSTER ANTIBODY, IGG: VARICELLA IGG: 660.6 {index} — AB (ref <135.00)

## 2013-04-11 LAB — HIV ANTIBODY (ROUTINE TESTING W REFLEX): HIV: NONREACTIVE

## 2013-04-12 LAB — CULTURE, OB URINE
Colony Count: NO GROWTH
ORGANISM ID, BACTERIA: NO GROWTH

## 2013-04-15 LAB — HEMOGLOBINOPATHY EVALUATION
HEMOGLOBIN OTHER: 0 %
HGB A: 97.1 % (ref 96.8–97.8)
Hgb A2 Quant: 2.9 % (ref 2.2–3.2)
Hgb F Quant: 0 % (ref 0.0–2.0)
Hgb S Quant: 0 %

## 2013-04-16 ENCOUNTER — Other Ambulatory Visit: Payer: Self-pay | Admitting: *Deleted

## 2013-04-16 DIAGNOSIS — N760 Acute vaginitis: Principal | ICD-10-CM

## 2013-04-16 DIAGNOSIS — Z3480 Encounter for supervision of other normal pregnancy, unspecified trimester: Secondary | ICD-10-CM

## 2013-04-16 DIAGNOSIS — B9689 Other specified bacterial agents as the cause of diseases classified elsewhere: Secondary | ICD-10-CM

## 2013-04-16 MED ORDER — METRONIDAZOLE 500 MG PO TABS: 500.0000 mg | ORAL_TABLET | Freq: Two times a day (BID) | ORAL | Status: DC

## 2013-04-16 MED ORDER — PRENATE MINI 18-0.6-0.4-350 MG PO CAPS: 1.0000 | ORAL_CAPSULE | Freq: Every day | ORAL | Status: DC

## 2013-04-24 ENCOUNTER — Encounter: Payer: Medicare Other | Admitting: Obstetrics

## 2013-04-25 ENCOUNTER — Ambulatory Visit: Payer: Medicare Other | Admitting: Obstetrics & Gynecology

## 2013-04-30 ENCOUNTER — Other Ambulatory Visit: Payer: Self-pay | Admitting: *Deleted

## 2013-04-30 ENCOUNTER — Encounter (HOSPITAL_COMMUNITY): Payer: Self-pay | Admitting: Obstetrics

## 2013-04-30 DIAGNOSIS — Z3480 Encounter for supervision of other normal pregnancy, unspecified trimester: Secondary | ICD-10-CM

## 2013-04-30 MED ORDER — VITAFOL-NANO 18-0.6-0.4 MG PO TABS: 18.0000 mg | ORAL_TABLET | Freq: Every day | ORAL | Status: DC

## 2013-05-08 ENCOUNTER — Encounter: Payer: Medicare Other | Admitting: Obstetrics & Gynecology

## 2013-05-10 ENCOUNTER — Other Ambulatory Visit: Payer: Self-pay | Admitting: Obstetrics

## 2013-05-10 DIAGNOSIS — O09529 Supervision of elderly multigravida, unspecified trimester: Secondary | ICD-10-CM

## 2013-05-11 ENCOUNTER — Inpatient Hospital Stay (HOSPITAL_COMMUNITY)
Admission: AD | Admit: 2013-05-11 | Discharge: 2013-05-11 | Disposition: A | Discharge status: Home / Self Care | Payer: Medicare HMO | Source: Ambulatory Visit | Attending: Obstetrics | Admitting: Obstetrics

## 2013-05-11 ENCOUNTER — Encounter (HOSPITAL_COMMUNITY): Payer: Self-pay | Admitting: *Deleted

## 2013-05-11 DIAGNOSIS — R059 Cough, unspecified: Secondary | ICD-10-CM | POA: Insufficient documentation

## 2013-05-11 DIAGNOSIS — O9989 Other specified diseases and conditions complicating pregnancy, childbirth and the puerperium: Principal | ICD-10-CM

## 2013-05-11 DIAGNOSIS — J3489 Other specified disorders of nose and nasal sinuses: Secondary | ICD-10-CM | POA: Insufficient documentation

## 2013-05-11 DIAGNOSIS — J301 Allergic rhinitis due to pollen: Secondary | ICD-10-CM | POA: Insufficient documentation

## 2013-05-11 DIAGNOSIS — F17200 Nicotine dependence, unspecified, uncomplicated: Secondary | ICD-10-CM

## 2013-05-11 DIAGNOSIS — J309 Allergic rhinitis, unspecified: Secondary | ICD-10-CM

## 2013-05-11 DIAGNOSIS — Z3490 Encounter for supervision of normal pregnancy, unspecified, unspecified trimester: Secondary | ICD-10-CM

## 2013-05-11 DIAGNOSIS — R05 Cough: Secondary | ICD-10-CM | POA: Insufficient documentation

## 2013-05-11 DIAGNOSIS — J302 Other seasonal allergic rhinitis: Secondary | ICD-10-CM

## 2013-05-11 DIAGNOSIS — K219 Gastro-esophageal reflux disease without esophagitis: Secondary | ICD-10-CM | POA: Insufficient documentation

## 2013-05-11 DIAGNOSIS — Z87891 Personal history of nicotine dependence: Secondary | ICD-10-CM | POA: Insufficient documentation

## 2013-05-11 DIAGNOSIS — J45909 Unspecified asthma, uncomplicated: Secondary | ICD-10-CM | POA: Insufficient documentation

## 2013-05-11 DIAGNOSIS — J029 Acute pharyngitis, unspecified: Secondary | ICD-10-CM | POA: Insufficient documentation

## 2013-05-11 DIAGNOSIS — O99891 Other specified diseases and conditions complicating pregnancy: Secondary | ICD-10-CM | POA: Insufficient documentation

## 2013-05-11 MED ORDER — FLUTICASONE PROPIONATE 50 MCG/ACT NA SUSP: 2.0000 | Freq: Every day | NASAL | Status: DC

## 2013-05-11 MED ORDER — CETIRIZINE-PSEUDOEPHEDRINE ER 5-120 MG PO TB12
1.0000 | ORAL_TABLET | Freq: Every day | ORAL | Status: DC
Start: 2013-05-11 — End: 2013-07-15

## 2013-05-11 NOTE — MAU Provider Note (Signed)
History     CSN: [id number]  Arrival date and time: 05/11/13 2224   First Provider Initiated Contact with Patient 05/11/13 2258      No chief complaint on file.  HPI Comments: [name] 33 y.o. [id number] presents to MAU with 4 days of stuffy nose making it difficult to breathe, coughing up clear mucous. Sore throat related to cough. She denies any fever. She quit smoking about 1.5 months ago. She has a significant history of asthma. No others ill around her.     Past Medical History  Diagnosis Date  . Complication of anesthesia   . Difficult intubation     "needs small tube"  . PONV (postoperative nausea and vomiting)   . Family history of anesthesia complication     Nausea  . Respiratory failure 11/2010; 09/2011  . Chronic back pain   . CHF (congestive heart failure) 2006    "with pregnancy, toxcemia "  not seeing a cardiologist now.  . Asthma     "very badly" (01/17/2012)  . Pneumonia     "~ once/yr" (01/17/2012)  . Chronic bronchitis     "@ least once/yr" (01/17/2012)  . Exertional dyspnea     Asthma  . GERD (gastroesophageal reflux disease)   . Migraines   . Degenerative disk disease   . Anxiety   . Cervical cancer 1999; 2000    Past Surgical History  Procedure Laterality Date  . Spinal fusion  10/04/2011    Procedure: FUSION POSTERIOR SPINAL MULTILEVEL;  Surgeon: Lowella Grip, MD;  Location: [facility];  Service: Orthopedics;  Laterality: Bilateral;  repair lumbar pseudoarthritis, revision segmental fixation, L3-L4, L4-L5  . Cervical cone biopsy  1999; 2000  . Adenoidectomy, tonsillectomy and myringotomy with tube placement  1983?  Marland Kitchen Tonsillectomy    . Knee arthroscopy  2004 X 2    "left X 2" (01/17/2012)  . Anterior lumbar fusion  01/17/2012  . Lumbar disc surgery  06/1998    "L4-5" (01/17/2012)  . Lumbar fusion  08/1998; 2010; 2012; 06/2011    "fusion cage; put rods in; reanchored screws; rod broke" (01/17/2012)  . Anterior lumbar fusion  01/17/2012   Procedure: ANTERIOR LUMBAR FUSION 2 LEVELS;  Surgeon: Lowella Grip, MD;  Location: [facility];  Service: Orthopedics;  Laterality: Bilateral;  Anterior Lumbar 3-4, Lumbar 4-5 fusion with bone graft harvest    Family History  Problem Relation Age of Onset  . Heart disease Father   . Hypertension Father   . Diabetes Father   . Deep vein thrombosis Father   . Cancer      History  Substance Use Topics  . Smoking status: Former Smoker -- 0.50 packs/day for 5 years    Types: Cigarettes  . Smokeless tobacco: Never Used  . Alcohol Use: Yes     Comment: 01/17/2012 "a drink at the holidays"    Allergies:  Allergies  Allergen Reactions  . Ultram [Tramadol] Itching    Prescriptions prior to admission  Medication Sig Dispense Refill  . albuterol (PROVENTIL HFA;VENTOLIN HFA) 108 (90 BASE) MCG/ACT inhaler Inhale 2 puffs into the lungs every 6 (six) hours as needed for wheezing.  1 Inhaler  2  . amoxicillin-clavulanate (AUGMENTIN) 500-125 MG per tablet Take 1 tablet (500 mg total) by mouth 3 (three) times daily.  30 tablet  1  . budesonide-formoterol (SYMBICORT) 160-4.5 MCG/ACT inhaler Inhale 2 puffs into the lungs 2 (two) times daily.  1 Inhaler  1  . clindamycin (CLINDAGEL)  1 % gel Apply topically 2 (two) times daily.  30 g  0  . diazepam (VALIUM) 5 MG tablet Take 1-2 tablets (5-10 mg total) by mouth every 8 (eight) hours as needed for anxiety (prn for muscl spasm).  100 tablet  1  . esomeprazole (NEXIUM) 40 MG capsule Take 1 capsule (40 mg total) by mouth daily before breakfast.  30 capsule  11  . HYDROcodone-acetaminophen (NORCO) 10-325 MG per tablet Take 1-2 tablets by mouth every 6 (six) hours as needed for pain.      Marland Kitchen HYDROcodone-acetaminophen (NORCO) 5-325 MG per tablet Take 2 tablets by mouth every 4 (four) hours as needed.  20 tablet  0  . metroNIDAZOLE (FLAGYL) 500 MG tablet Take 1 tablet (500 mg total) by mouth 2 (two) times daily.  14 tablet  0  .  neomycin-polymyxin-dexamethasone (MAXITROL) 0.1 % ophthalmic suspension 1 drop 4 (four) times daily.      Marland Kitchen omeprazole (PRILOSEC) 20 MG capsule Take 1 capsule (20 mg total) by mouth 2 (two) times daily before a meal.  60 capsule  5  . Prenatal-Fe Fum-Methf-FA w/o A (VITAFOL-NANO) 18-0.6-0.4 MG TABS Take 18 mg by mouth daily.  30 tablet  11  . rivaroxaban (XARELTO) 10 MG TABS tablet Take 1 tablet (10 mg total) by mouth daily with supper.  30 tablet  0  . SUMAtriptan (IMITREX) 25 MG tablet Take 25 mg by mouth every 2 (two) hours as needed. For migraines        Review of Systems  Constitutional: Negative for fever.  HENT: Positive for congestion and sore throat.   Respiratory: Positive for cough. Negative for wheezing.    Physical Exam   Blood pressure 107/68, pulse 101, temperature 98 F (36.7 C), temperature source Oral, resp. rate 20, height 5\' 2"  (1.575 m), weight 182 lb 8 oz (82.781 kg), last menstrual period 02/16/2013, SpO2 100.00%.  Physical Exam  Constitutional: She appears well-developed and well-nourished. No distress.  HENT:  Head: Normocephalic and atraumatic.  Mouth/Throat: No oropharyngeal exudate.  Neck: Normal range of motion. Neck supple.  Cardiovascular: Normal rate, regular rhythm and normal heart sounds.   Respiratory: Effort normal and breath sounds normal. No respiratory distress. She has no wheezes. She has no rales. She exhibits no tenderness.  O2 Sat 100 %  Lymphadenopathy:    She has no cervical adenopathy.    MAU Course  Procedures  MDM   Assessment and Plan   A: Seasonal Allergies Pregnancy  P: Zyrtec 10 mg daily Nasocort nasal spray daily Discussed avoiding blossoming trees/ keeping windows closed Follow up with Dr Massie Bougie, [place] 05/11/2013, 11:11 PM

## 2013-05-11 NOTE — MAU Note (Addendum)
PT SAYS   SHE HAS  SINUS  CONGESTION- STARTED  ON Tuesday-  WHEN SHE LAYS DOWN  SHE CAN'T BREATH  THROUGH MOUTH.    WHEN SHE SNEEZED/ COUGHED - SHE HAS PAIN ON LEFT LOWER SIDE.  NO PAIN ON LEFT SIDE  IF SHE DOES  NOT SNEEZE/ COUGH.   LAST SEX-   YESTERDAY.   WAS IN OFFICE  IN FEB  AND [facility] FOR U/S  ON 3-31.

## 2013-05-11 NOTE — Discharge Instructions (Signed)
Allergic Rhinitis Allergic rhinitis is when the mucous membranes in the nose respond to allergens. Allergens are particles in the air that cause your body to have an allergic reaction. This causes you to release allergic antibodies. Through a chain of events, these eventually cause you to release histamine into the blood stream. Although meant to protect the body, it is this release of histamine that causes your discomfort, such as frequent sneezing, congestion, and an itchy, runny nose.  CAUSES  Seasonal allergic rhinitis (hay fever) is caused by pollen allergens that may come from grasses, trees, and weeds. Year-round allergic rhinitis (perennial allergic rhinitis) is caused by allergens such as house dust mites, pet dander, and mold spores.  SYMPTOMS   Nasal stuffiness (congestion).  Itchy, runny nose with sneezing and tearing of the eyes. DIAGNOSIS  Your health care provider can help you determine the allergen or allergens that trigger your symptoms. If you and your health care provider are unable to determine the allergen, skin or blood testing may be used. TREATMENT  Allergic Rhinitis does not have a cure, but it can be controlled by:  Medicines and allergy shots (immunotherapy).  Avoiding the allergen. Hay fever may often be treated with antihistamines in pill or nasal spray forms. Antihistamines block the effects of histamine. There are over-the-counter medicines that may help with nasal congestion and swelling around the eyes. Check with your health care provider before taking or giving this medicine.  If avoiding the allergen or the medicine prescribed do not work, there are many new medicines your health care provider can prescribe. Stronger medicine may be used if initial measures are ineffective. Desensitizing injections can be used if medicine and avoidance does not work. Desensitization is when a patient is given ongoing shots until the body becomes less sensitive to the allergen.  Make sure you follow up with your health care provider if problems continue. HOME CARE INSTRUCTIONS It is not possible to completely avoid allergens, but you can reduce your symptoms by taking steps to limit your exposure to them. It helps to know exactly what you are allergic to so that you can avoid your specific triggers. SEEK MEDICAL CARE IF:   You have a fever.  You develop a cough that does not stop easily (persistent).  You have shortness of breath.  You start wheezing.  Symptoms interfere with normal daily activities. Document Released: 10/26/2000 Document Revised: 11/21/2012 Document Reviewed: 10/08/2012 Exit[employer] Patient Information 2014 [employer].  

## 2013-05-14 ENCOUNTER — Ambulatory Visit (HOSPITAL_COMMUNITY)
Admission: RE | Admit: 2013-05-14 | Discharge: 2013-05-14 | Disposition: A | Discharge status: Home / Self Care | Payer: Medicare HMO | Source: Ambulatory Visit | Attending: Obstetrics | Admitting: Obstetrics

## 2013-05-14 ENCOUNTER — Other Ambulatory Visit: Payer: Self-pay

## 2013-05-14 ENCOUNTER — Other Ambulatory Visit: Payer: Self-pay | Admitting: Obstetrics

## 2013-05-14 ENCOUNTER — Encounter (HOSPITAL_COMMUNITY): Payer: Self-pay

## 2013-05-14 DIAGNOSIS — O351XX0 Maternal care for (suspected) chromosomal abnormality in fetus, not applicable or unspecified: Secondary | ICD-10-CM | POA: Insufficient documentation

## 2013-05-14 DIAGNOSIS — O9989 Other specified diseases and conditions complicating pregnancy, childbirth and the puerperium: Secondary | ICD-10-CM | POA: Insufficient documentation

## 2013-05-14 DIAGNOSIS — Z3689 Encounter for other specified antenatal screening: Secondary | ICD-10-CM | POA: Insufficient documentation

## 2013-05-14 DIAGNOSIS — O3510X0 Maternal care for (suspected) chromosomal abnormality in fetus, unspecified, not applicable or unspecified: Secondary | ICD-10-CM | POA: Insufficient documentation

## 2013-05-14 DIAGNOSIS — O09529 Supervision of elderly multigravida, unspecified trimester: Secondary | ICD-10-CM | POA: Insufficient documentation

## 2013-05-14 NOTE — ED Notes (Signed)
Appointment Date: 05/14/2013 DOB: Nov 09, 1980 Referring Provider: Dr. Baltazar Najjar Attending: Dr. Netta Cedars  [name] and her husband, Abrial Arrighi, were seen for genetic counseling because of a maternal age of 33 y.o.Marland Kitchen     They were counseled regarding maternal age and the association with risk for chromosome conditions due to nondisjunction with aging of the ova.   We reviewed chromosomes, nondisjunction, and the associated 1 in 114 risk for fetal aneuploidy related to a maternal age of 34 y.o. at [redacted]w[redacted]d gestation.  They were counseled that the risk for aneuploidy decreases as gestational age increases, accounting for those pregnancies which spontaneously abort.  We specifically discussed Down syndrome (trisomy 42), trisomies 84 and 65, and sex chromosome aneuploidies (47,XXX and 47,XXY) including the common features and prognoses of each.   We reviewed available screening options including First Screen,  noninvasive prenatal screening (NIPS)/cell free fetal DNA (cffDNA) testing, and detailed ultrasound.  They were counseled that screening tests are used to modify a patient's a priori risk for aneuploidy, typically based on age. This estimate provides a pregnancy specific risk assessment. We reviewed the benefits and limitations of each option. Specifically, we discussed the conditions for which each test screens, the detection rates, and false positive rates of each. They were also counseled regarding diagnostic testing via  amniocentesis. We reviewed the approximate 1 in 161-096 risk for complications for amniocentesis, including spontaneous pregnancy loss. After consideration of all the options, they elected to proceed with NIPS.  Those results will be available in 10-14 days.  They were counseled regarding the expense of NIPS, and that approximately $3000 is billed to the insurance provider.  The amount they will be responsible for, out of pocket, depends upon the specific  plan and is subject to co-pay, co-insurance and/or deductible.  They were provided with a patient information sheet on billing as well as a contact phone number for the representative and encouraged them to contact her when they receive a bill or an explanation of benefits.    They also expressed interest in pursuing a nuchal translucency ultrasound, which was performed today.  The report will be documented separately.  They understand that screening tests cannot rule out all birth defects or genetic syndromes. The patient was advised of this limitation and states she still does not want diagnostic testing at this time.    Mrs. Landgrebe was provided with written information regarding cystic fibrosis (CF) including the carrier frequency and incidence in the Caucasian population, the availability of carrier testing and prenatal diagnosis if indicated.  In addition, we discussed that CF is routinely screened for as part of the [place] newborn screening panel.  She declined testing today.   Both family histories were reviewed.  Mrs. Diodato reported that she has Jewish ancestry and Mr. Cinque is Native American.  We discussed the availability of carrier screening for certain genetic conditions based on Jewish ancestry.  Genetic testing is available for some of the more common conditions, including Canavan disease, cystic fibrosis, Tay sachs disease, Gaucher disease, Mucolipidosis type IV,  Neimann-Pick type A, Fanconi anemia type C, Bloom syndrome, and familial dysautonomia.  All of these conditions are inherited in an autosomal recessive fashion, where both parents have to be carriers of the condition before a baby is at increased risk (1 in 4, or 25% risk) to inherit the disease. Carrier screening performed through a standard blood draw can reduce but not eliminate a persons chance to be a carrier for these  conditions.  Mrs. Nahar declined [name] ancestry based carrier screening at the time  of today's visit.  The remainder of the family history was found to be noncontributory for birth defects, intellectual disability, and known genetic conditions. Without further information regarding the provided family history, an accurate genetic risk cannot be calculated. Further genetic counseling is warranted if more information is obtained.  Mrs. Couvillon denied exposure to environmental toxins or chemical agents. She denied the use of alcohol, tobacco or street drugs. She denied significant viral illnesses during the course of her pregnancy. Her medical and surgical histories were noncontributory.   I counseled this couple regarding the above risks and available options.  The approximate face-to-face time with the genetic counselor was 40 minutes.  Cam Hai, MS,  Certified Genetic Counselor

## 2013-05-15 ENCOUNTER — Encounter (HOSPITAL_COMMUNITY): Payer: Medicare Other

## 2013-05-15 ENCOUNTER — Other Ambulatory Visit (HOSPITAL_COMMUNITY): Payer: Commercial Managed Care - HMO

## 2013-05-15 ENCOUNTER — Other Ambulatory Visit (HOSPITAL_COMMUNITY): Payer: Medicare Other

## 2013-05-21 ENCOUNTER — Ambulatory Visit (INDEPENDENT_AMBULATORY_CARE_PROVIDER_SITE_OTHER): Payer: Medicare HMO | Admitting: Obstetrics

## 2013-05-21 ENCOUNTER — Encounter: Payer: Self-pay | Admitting: Obstetrics

## 2013-05-21 VITALS — BP 127/76 | Temp 99.1°F | Wt 183.0 lb

## 2013-05-21 DIAGNOSIS — Z3480 Encounter for supervision of other normal pregnancy, unspecified trimester: Secondary | ICD-10-CM

## 2013-05-21 LAB — POCT URINALYSIS DIPSTICK
Bilirubin, UA: NEGATIVE
Blood, UA: NEGATIVE
Glucose, UA: NEGATIVE
Ketones, UA: NEGATIVE
LEUKOCYTES UA: NEGATIVE
NITRITE UA: NEGATIVE
PH UA: 5
PROTEIN UA: NEGATIVE
Spec Grav, UA: 1.025
Urobilinogen, UA: NEGATIVE

## 2013-05-21 NOTE — Progress Notes (Signed)
Pulse- 105 Patient states she is having pressure in her left side. Patient states she is having increasing pain in her back. Patient states she has a history of 6 back surgeries.

## 2013-05-23 ENCOUNTER — Other Ambulatory Visit (HOSPITAL_COMMUNITY): Payer: Self-pay | Admitting: Maternal and Fetal Medicine

## 2013-05-23 DIAGNOSIS — O09529 Supervision of elderly multigravida, unspecified trimester: Secondary | ICD-10-CM

## 2013-05-29 ENCOUNTER — Telehealth (HOSPITAL_COMMUNITY): Payer: Self-pay | Admitting: Maternal and Fetal Medicine

## 2013-05-29 NOTE — Telephone Encounter (Signed)
Called [name] to discuss her cell free fetal DNA test results.  Mrs. Treasure Ochs Lizer had Panorama testing through [facility].  Testing was offered because of AMA.   The patient was identified by name and DOB.  We reviewed that these are within normal limits, showing a less than 1 in 10,000 risk for trisomies 21, 18 and 13, and monosomy X (Turner syndrome).  In addition, the risk for triploidy/vanishing twin and sex chromosome trisomies (47,XXX and 47,XXY) was also low risk. We reviewed that this testing identifies > 99% of pregnancies with trisomy 3, trisomy 25, sex chromosome trisomies (47,XXX and 47,XXY), and triploidy. The detection rate for trisomy 18 is 96%.  The detection rate for monosomy X is ~92%.  The false positive rate is <0.1% for all conditions. Testing was also consistent with female gender.  The patient did wish to know gender.  She understands that this testing does not identify all genetic conditions.  All questions were answered to her satisfaction, she was encouraged to call with additional questions or concerns.  Cam Hai, MS Certified Genetic Counselor

## 2013-06-03 ENCOUNTER — Encounter: Payer: Medicare HMO | Admitting: Obstetrics

## 2013-06-03 ENCOUNTER — Encounter: Payer: Self-pay | Admitting: Obstetrics

## 2013-06-04 ENCOUNTER — Encounter: Payer: Medicare HMO | Admitting: Obstetrics

## 2013-06-17 ENCOUNTER — Ambulatory Visit (INDEPENDENT_AMBULATORY_CARE_PROVIDER_SITE_OTHER): Payer: Medicare HMO | Admitting: Obstetrics

## 2013-06-17 ENCOUNTER — Encounter: Payer: Self-pay | Admitting: Obstetrics

## 2013-06-17 VITALS — BP 124/66 | HR 92 | Temp 98.1°F | Wt 191.0 lb

## 2013-06-17 DIAGNOSIS — J309 Allergic rhinitis, unspecified: Secondary | ICD-10-CM

## 2013-06-17 DIAGNOSIS — J302 Other seasonal allergic rhinitis: Secondary | ICD-10-CM

## 2013-06-17 DIAGNOSIS — Z3480 Encounter for supervision of other normal pregnancy, unspecified trimester: Secondary | ICD-10-CM

## 2013-06-17 LAB — POCT URINALYSIS DIPSTICK
Glucose, UA: NEGATIVE
Ketones, UA: NEGATIVE
Leukocytes, UA: NEGATIVE
NITRITE UA: NEGATIVE
PH UA: 7
PROTEIN UA: NEGATIVE
RBC UA: NEGATIVE
Spec Grav, UA: <=1.005

## 2013-06-17 MED ORDER — LORATADINE 10 MG PO TABS: 10.0000 mg | ORAL_TABLET | Freq: Every day | ORAL | Status: DC

## 2013-06-17 MED ORDER — OB COMPLETE PETITE 35-5-1-200 MG PO CAPS: 1.0000 | ORAL_CAPSULE | Freq: Every day | ORAL | Status: DC

## 2013-06-17 NOTE — Progress Notes (Signed)
Subjective:    [name] is a 33 y.o. female being seen today for her obstetrical visit. She is at [redacted]w[redacted]d gestation. Patient reports: no complaints . Fetal movement: normal.  Problem List Items Addressed This Visit   None    Visit Diagnoses   Supervision of other normal pregnancy    -  Primary    Relevant Medications       Prenat-FeCbn-FeAspGl-FA-Omega (OB COMPLETE PETITE) 35-5-1-200 MG CAPS    Other Relevant Orders       POCT urinalysis dipstick (Completed)    Allergic rhinitis, seasonal        Relevant Medications       loratadine (CLARITIN) tablet 10 mg      Patient Active Problem List   Diagnosis Date Noted  . Supervision of high-risk pregnancy of elderly multigravida 04/10/2013  . Scoliosis 03/27/2013  . Folliculitis 97/03/6376  . GERD without esophagitis 06/12/2012  . Degeneration of intervertebral disc, site unspecified 12/28/2011  . Asthma exacerbation 10/04/2011  . Back pain 10/04/2011  . S/P lumbar spinal fusion 10/04/2011   Objective:    BP 124/66  Pulse 92  Temp(Src) 98.1 F (36.7 C)  Wt 191 lb (86.637 kg)  LMP 02/16/2013 FHT: 150 BPM  Uterine Size: size equals dates     Assessment:    Pregnancy @ [redacted]w[redacted]d    Plan:    OBGCT: discussed.  Labs, problem list reviewed and updated 2 hr GTT planned Follow up in 4 weeks.

## 2013-06-19 ENCOUNTER — Ambulatory Visit (HOSPITAL_COMMUNITY)
Admission: RE | Admit: 2013-06-19 | Discharge: 2013-06-19 | Disposition: A | Discharge status: Home / Self Care | Payer: Medicare HMO | Source: Ambulatory Visit | Attending: Obstetrics | Admitting: Obstetrics

## 2013-06-19 ENCOUNTER — Other Ambulatory Visit (HOSPITAL_COMMUNITY): Payer: Self-pay | Admitting: Maternal and Fetal Medicine

## 2013-06-19 DIAGNOSIS — Z3689 Encounter for other specified antenatal screening: Secondary | ICD-10-CM | POA: Insufficient documentation

## 2013-06-19 DIAGNOSIS — O09529 Supervision of elderly multigravida, unspecified trimester: Secondary | ICD-10-CM

## 2013-06-24 ENCOUNTER — Other Ambulatory Visit: Payer: Self-pay | Admitting: Obstetrics

## 2013-06-24 DIAGNOSIS — O09529 Supervision of elderly multigravida, unspecified trimester: Secondary | ICD-10-CM

## 2013-06-24 DIAGNOSIS — O358XX0 Maternal care for other (suspected) fetal abnormality and damage, not applicable or unspecified: Secondary | ICD-10-CM

## 2013-07-15 ENCOUNTER — Ambulatory Visit (INDEPENDENT_AMBULATORY_CARE_PROVIDER_SITE_OTHER): Payer: Medicare HMO | Admitting: Obstetrics

## 2013-07-15 ENCOUNTER — Encounter: Payer: Self-pay | Admitting: Obstetrics

## 2013-07-15 VITALS — BP 137/79 | HR 91 | Temp 98.6°F | Wt 201.0 lb

## 2013-07-15 DIAGNOSIS — Z3480 Encounter for supervision of other normal pregnancy, unspecified trimester: Secondary | ICD-10-CM

## 2013-07-15 LAB — POCT URINALYSIS DIPSTICK
Blood, UA: NEGATIVE
Glucose, UA: NEGATIVE
KETONES UA: NEGATIVE
LEUKOCYTES UA: NEGATIVE
Nitrite, UA: NEGATIVE
PH UA: 7
PROTEIN UA: NEGATIVE
Spec Grav, UA: 1.01

## 2013-07-15 NOTE — Progress Notes (Signed)
Subjective:    [name] is a 33 y.o. female being seen today for her obstetrical visit. She is at [redacted]w[redacted]d gestation. Patient reports: backache . Fetal movement: normal.  Problem List Items Addressed This Visit   None    Visit Diagnoses   Supervision of other normal pregnancy    -  Primary    Relevant Orders       POCT urinalysis dipstick (Completed)      Patient Active Problem List   Diagnosis Date Noted  . Supervision of high-risk pregnancy of elderly multigravida 04/10/2013  . Scoliosis 03/27/2013  . Folliculitis 72/53/6644  . GERD without esophagitis 06/12/2012  . Degeneration of intervertebral disc, site unspecified 12/28/2011  . Asthma exacerbation 10/04/2011  . Back pain 10/04/2011  . S/P lumbar spinal fusion 10/04/2011   Objective:    BP 137/79  Pulse 91  Temp(Src) 98.6 F (37 C)  Wt 201 lb (91.173 kg)  LMP 02/16/2013 FHT: 150 BPM  Uterine Size: size equals dates     Assessment:    Pregnancy @ [redacted]w[redacted]d    Plan:    OBGCT: ordered for next visit.  Labs, problem list reviewed and updated 2 hr GTT planned Follow up in 4 weeks.

## 2013-07-19 ENCOUNTER — Other Ambulatory Visit: Payer: Self-pay | Admitting: Obstetrics

## 2013-07-22 ENCOUNTER — Telehealth: Payer: Self-pay | Admitting: *Deleted

## 2013-07-22 NOTE — Telephone Encounter (Signed)
Patient calling requesting Dr Jodi Mourning send a letter to [facility] ([place]). LM on VM to CB.

## 2013-07-23 NOTE — Telephone Encounter (Signed)
Patient is requesting an Rx for a shoe for insert. Patient was seen at [facility] and they said that they would feel comfortable writing medication for patient until she can be seen by pain management. Need a letter from [place] giving his permission. Patient sees Dr Van Clines. Patient is to start physical therapy tomorrow. Patient is requesting a medication for headache.

## 2013-07-24 ENCOUNTER — Encounter: Payer: Self-pay | Admitting: *Deleted

## 2013-07-24 ENCOUNTER — Other Ambulatory Visit: Payer: Self-pay | Admitting: Obstetrics

## 2013-07-24 DIAGNOSIS — R519 Headache, unspecified: Secondary | ICD-10-CM

## 2013-07-24 DIAGNOSIS — R51 Headache: Principal | ICD-10-CM

## 2013-07-24 MED ORDER — BUTALBITAL-APAP-CAFFEINE 50-325-40 MG PO TABS: 2.0000 | ORAL_TABLET | Freq: Four times a day (QID) | ORAL | Status: DC | PRN

## 2013-07-24 NOTE — Telephone Encounter (Signed)
Fioricet for HA.

## 2013-07-30 ENCOUNTER — Ambulatory Visit (HOSPITAL_COMMUNITY)
Admission: RE | Admit: 2013-07-30 | Discharge: 2013-07-30 | Disposition: A | Discharge status: Home / Self Care | Payer: Medicare HMO | Source: Ambulatory Visit | Attending: Obstetrics | Admitting: Obstetrics

## 2013-07-30 ENCOUNTER — Encounter (HOSPITAL_COMMUNITY): Payer: Self-pay

## 2013-07-30 DIAGNOSIS — O358XX0 Maternal care for other (suspected) fetal abnormality and damage, not applicable or unspecified: Secondary | ICD-10-CM | POA: Insufficient documentation

## 2013-07-30 DIAGNOSIS — O09529 Supervision of elderly multigravida, unspecified trimester: Secondary | ICD-10-CM | POA: Insufficient documentation

## 2013-07-30 DIAGNOSIS — Z3689 Encounter for other specified antenatal screening: Secondary | ICD-10-CM | POA: Insufficient documentation

## 2013-07-30 NOTE — Telephone Encounter (Signed)
All patient request have been taken care of.

## 2013-07-31 ENCOUNTER — Ambulatory Visit (HOSPITAL_COMMUNITY): Admission: RE | Admit: 2013-07-31 | Payer: Medicare HMO | Source: Ambulatory Visit

## 2013-08-09 IMAGING — CT CT T SPINE W/O CM
3 of 14 series · 8 of 33 positions shown, 10 images · non-contrast
Comparison: CT myelogram 01/08/2007.

CLINICAL DATA: Preop for lumbar spinal fusion.  Pseudoarthrosis and
rod fracture.

CT LUMBAR SPINE WITHOUT CONTRAST,CT THORACIC SPINE WITHOUT CONTRAST
TECHNIQUE: Multidetector CT imaging of the lumbar spine was
performed without intravenous contrast administration. Multiplanar
CT image reconstructions were also generated.,Technique:
Multidetector CT imaging of the thoracic spine was performed
without

[Series 8034: o-mar, axial · axial · 0.31mm/px · z∈[-379,-88]mm · 2 of 147 slices shown, 3 images]
[im 1/147  soft-tissue]
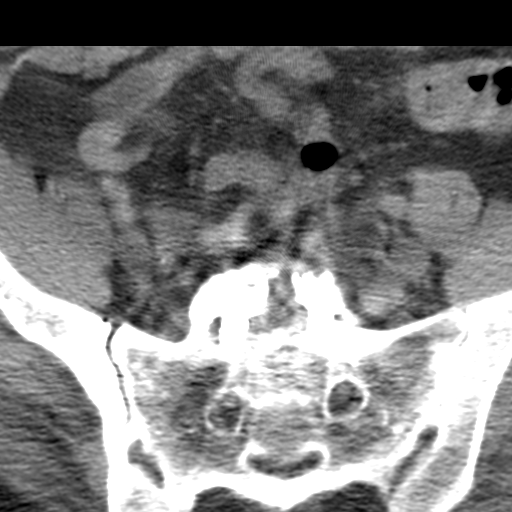
[im 1/147  bone]
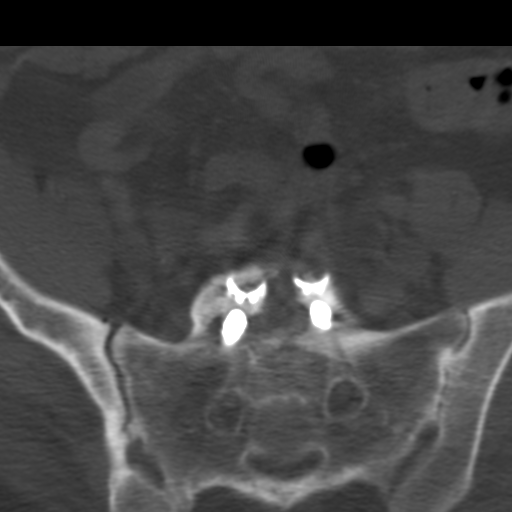
[im 147/147  bone]
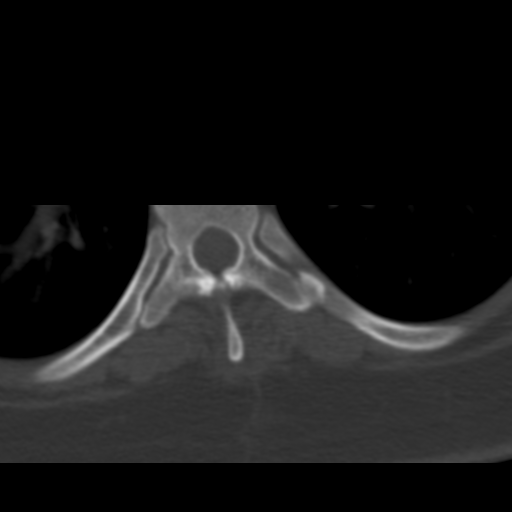

[Series 8035: o-mar, coronal · coronal · 0.60mm/px · 1 of 59 slices shown]
[im 30/59  bone]
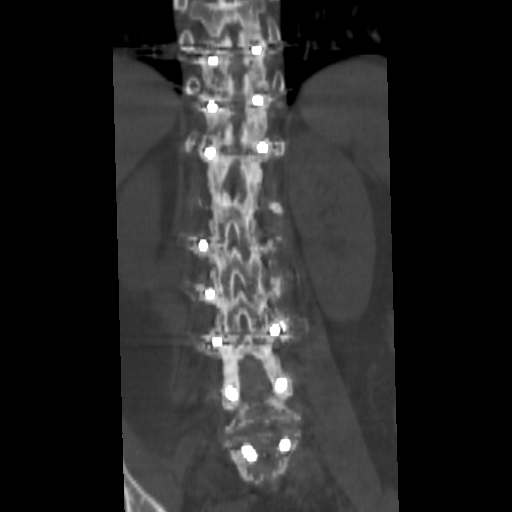

[Series 8036: o-mar, sagittal · sagittal · 0.59mm/px · 5 of 60 slices shown, 6 images]
[im 20/60  bone]
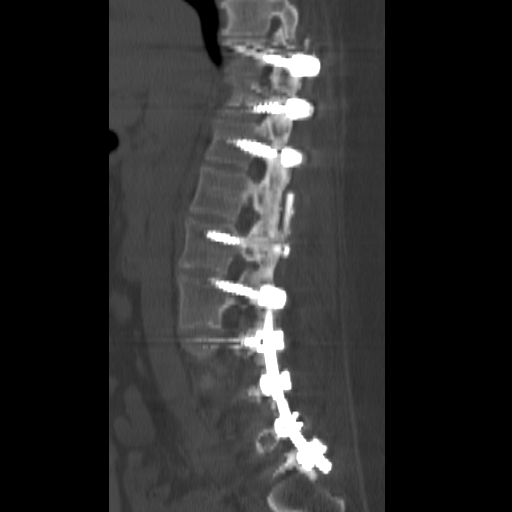
[im 25/60  bone]
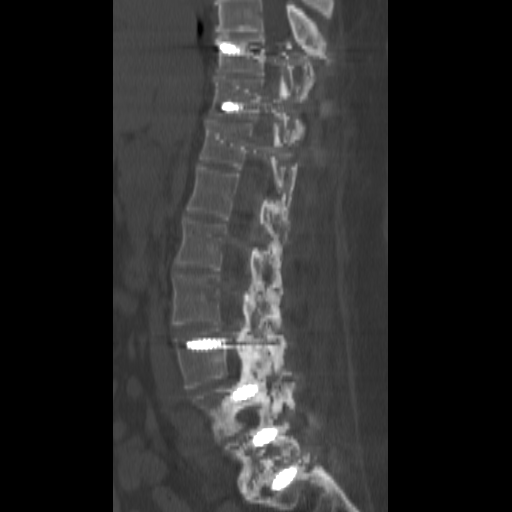
[im 30/60  soft-tissue]
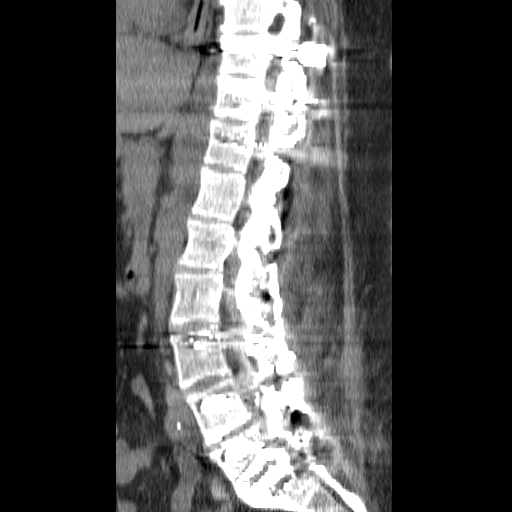
[im 30/60  bone]
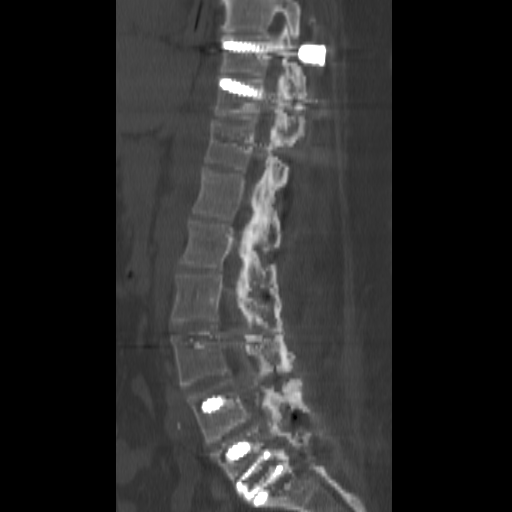
[im 35/60  bone]
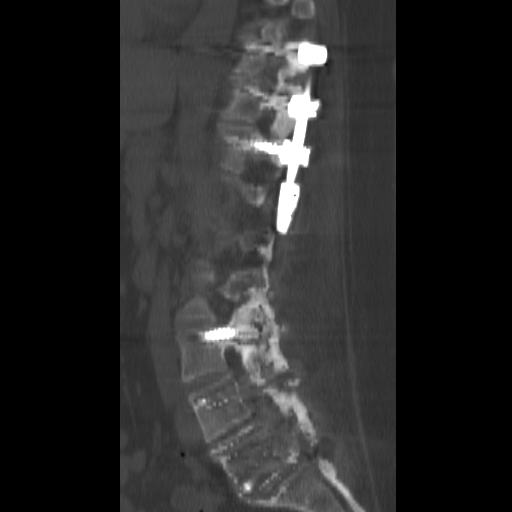
[im 40/60  bone]
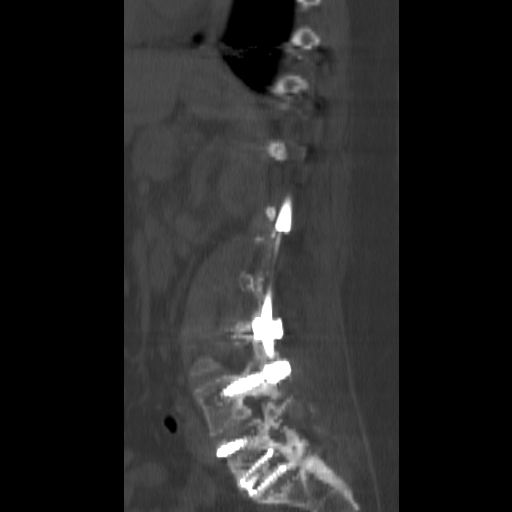

[8 of 33 positions shown; findings below may reference images not displayed]

FINDINGS: There are pedicle screws and posterior rods extending
from T9-S1.  The rods are fractured at L4.  Interbody ray cage
fusion noted at L5-S1.  The pedicle screws appear well-positioned
and no obvious pedicle screw fractures or malposition.

There appears to be solid posterior fusion down to L4.  Incomplete
posterior fusion/pseudoarthrosis at L4-5.

Solid appearing interbody ray cage fusion at L5-S1 without
complicating features.  There is mild bony foraminal encroachment
bilaterally.
IMPRESSION: 1.  Fracture of the Harrington rods and L4.
2.  Incomplete posterior fusion/pseudoarthrosis at L4-5.
3.  Solid appearing interbody ray cage fusion at L5-S1.
4.  No spinal or foraminal stenosis is demonstrated except for mild
bony foraminal encroachment at L5-S1 bilaterally.

## 2013-08-10 IMAGING — CR DG CHEST 1V PORT SAME DAY
1 series · 1 of 1 positions shown · non-contrast
Comparison: 11/04/2011

CLINICAL DATA: Pneumonia, wheezing, cough.

PORTABLE CHEST - 1 VIEW SAME DAY

[AP]
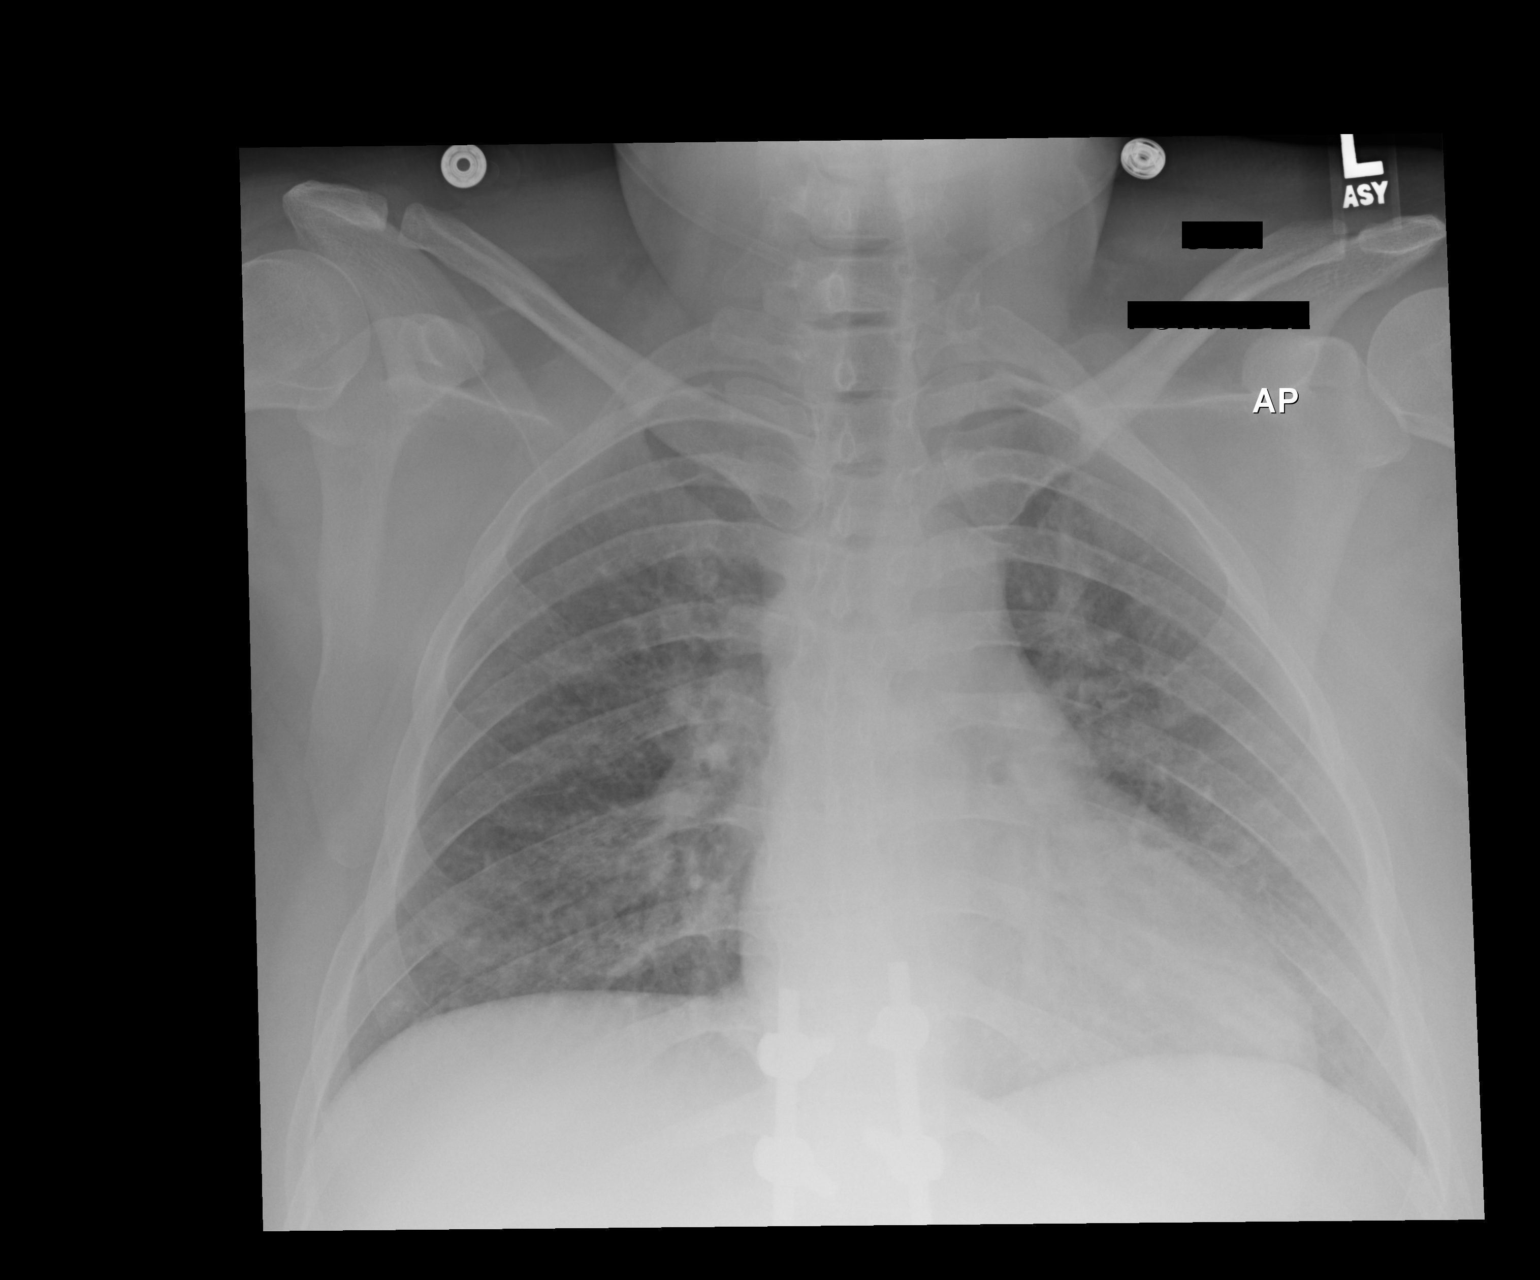

[1 of 1 positions shown; findings below may reference images not displayed]

FINDINGS: Heart size is borderline.  Mild vascular congestion.
Diffuse interstitial prominence and peribronchial thickening could
reflect interstitial edema or atypical infection such as viral or
mycoplasma.  No confluent consolidation or effusions.
IMPRESSION: Diffuse interstitial prominence and peribronchial thickening with
borderline heart size.  Question interstitial edema versus atypical
(viral/mycoplasma) infection.

## 2013-08-12 ENCOUNTER — Encounter: Payer: Self-pay | Admitting: Obstetrics

## 2013-08-12 ENCOUNTER — Other Ambulatory Visit: Payer: Medicare HMO

## 2013-08-12 ENCOUNTER — Ambulatory Visit (INDEPENDENT_AMBULATORY_CARE_PROVIDER_SITE_OTHER): Payer: Medicare HMO | Admitting: Obstetrics

## 2013-08-12 VITALS — BP 119/76 | HR 92 | Wt 200.0 lb

## 2013-08-12 DIAGNOSIS — Z3480 Encounter for supervision of other normal pregnancy, unspecified trimester: Secondary | ICD-10-CM

## 2013-08-12 DIAGNOSIS — G44009 Cluster headache syndrome, unspecified, not intractable: Secondary | ICD-10-CM

## 2013-08-12 DIAGNOSIS — Z3482 Encounter for supervision of other normal pregnancy, second trimester: Secondary | ICD-10-CM

## 2013-08-12 IMAGING — CR DG CHEST 2V
2 series · 2 of 2 positions shown · non-contrast
Comparison: 10/05/2011

CLINICAL DATA: Shortness of breath.

CHEST - 2 VIEW

[w chest pa]
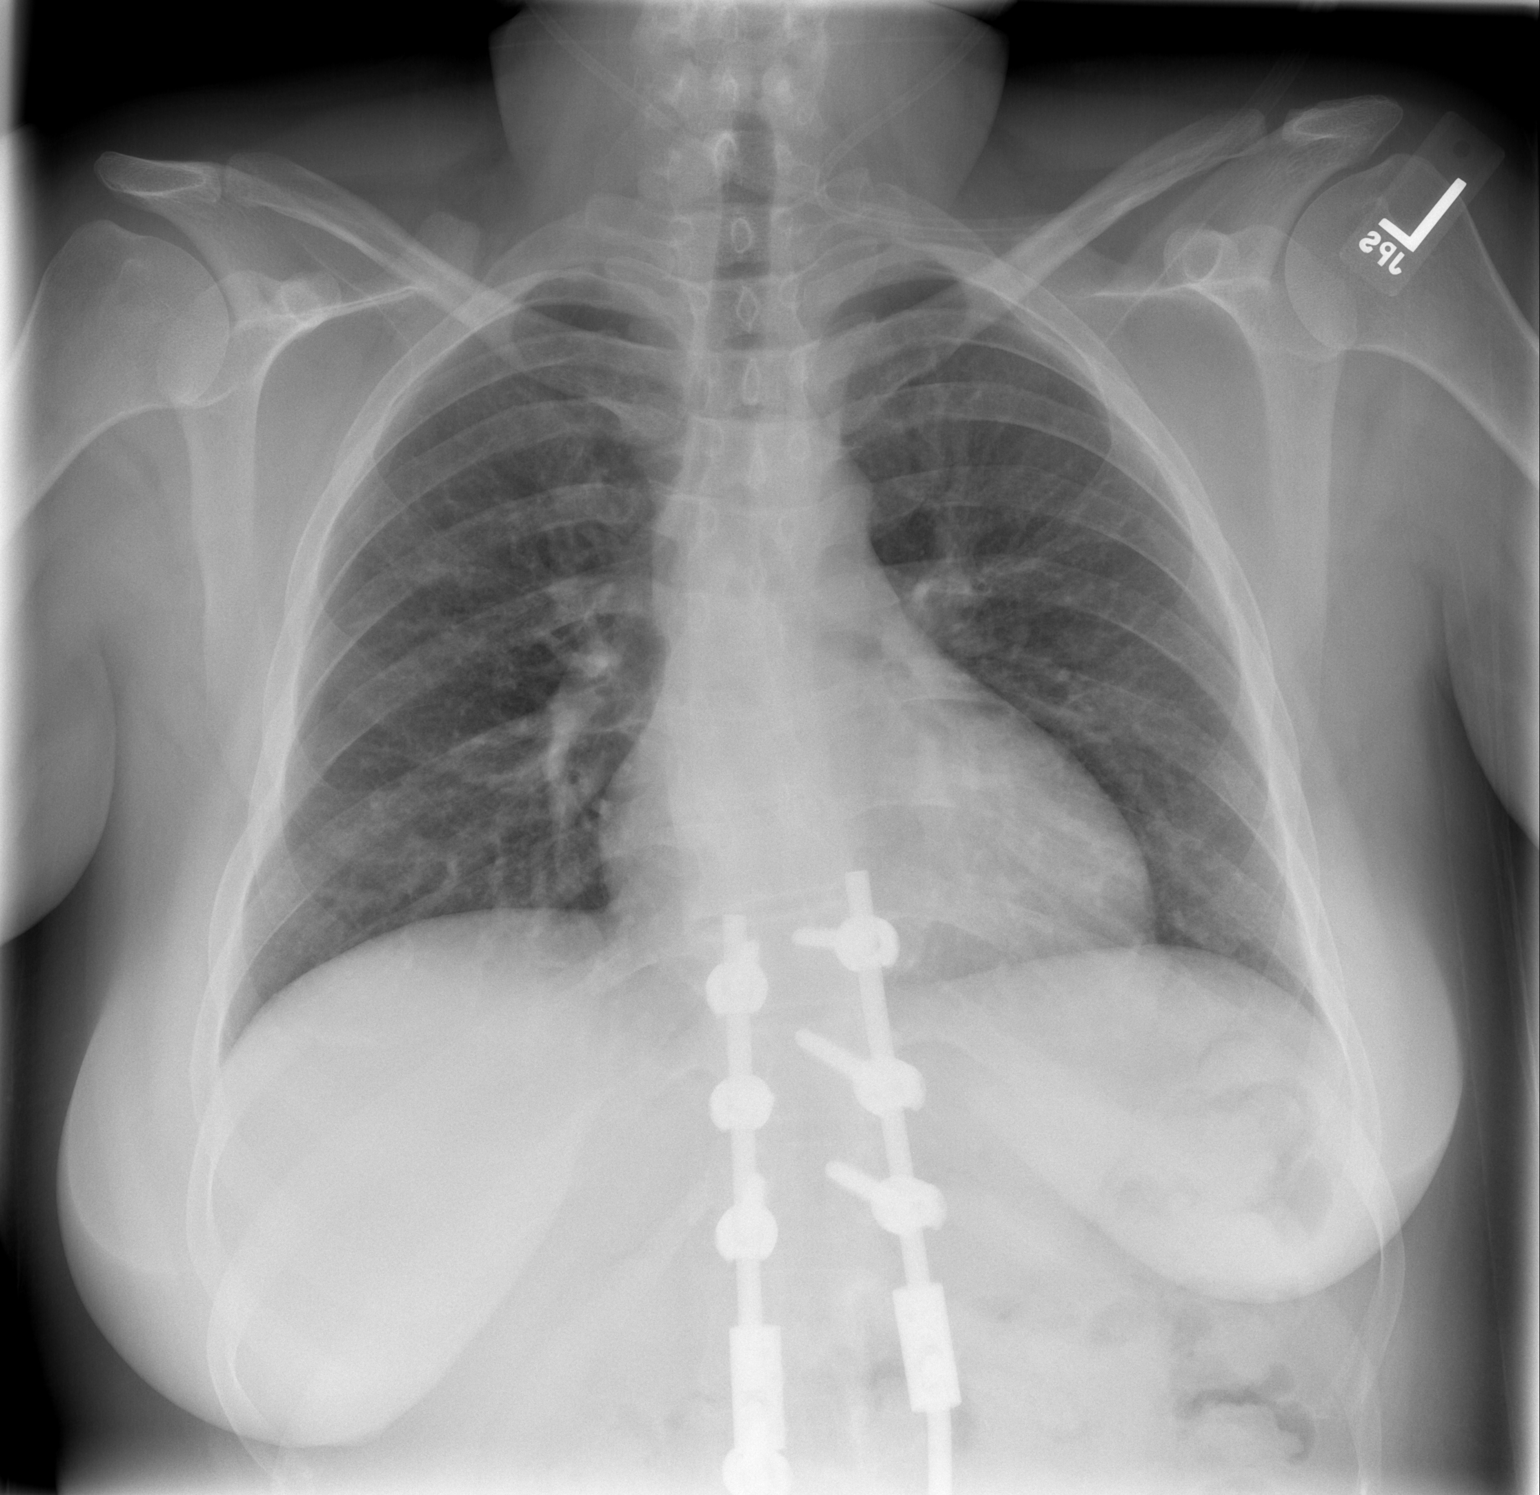

[w chest lat]
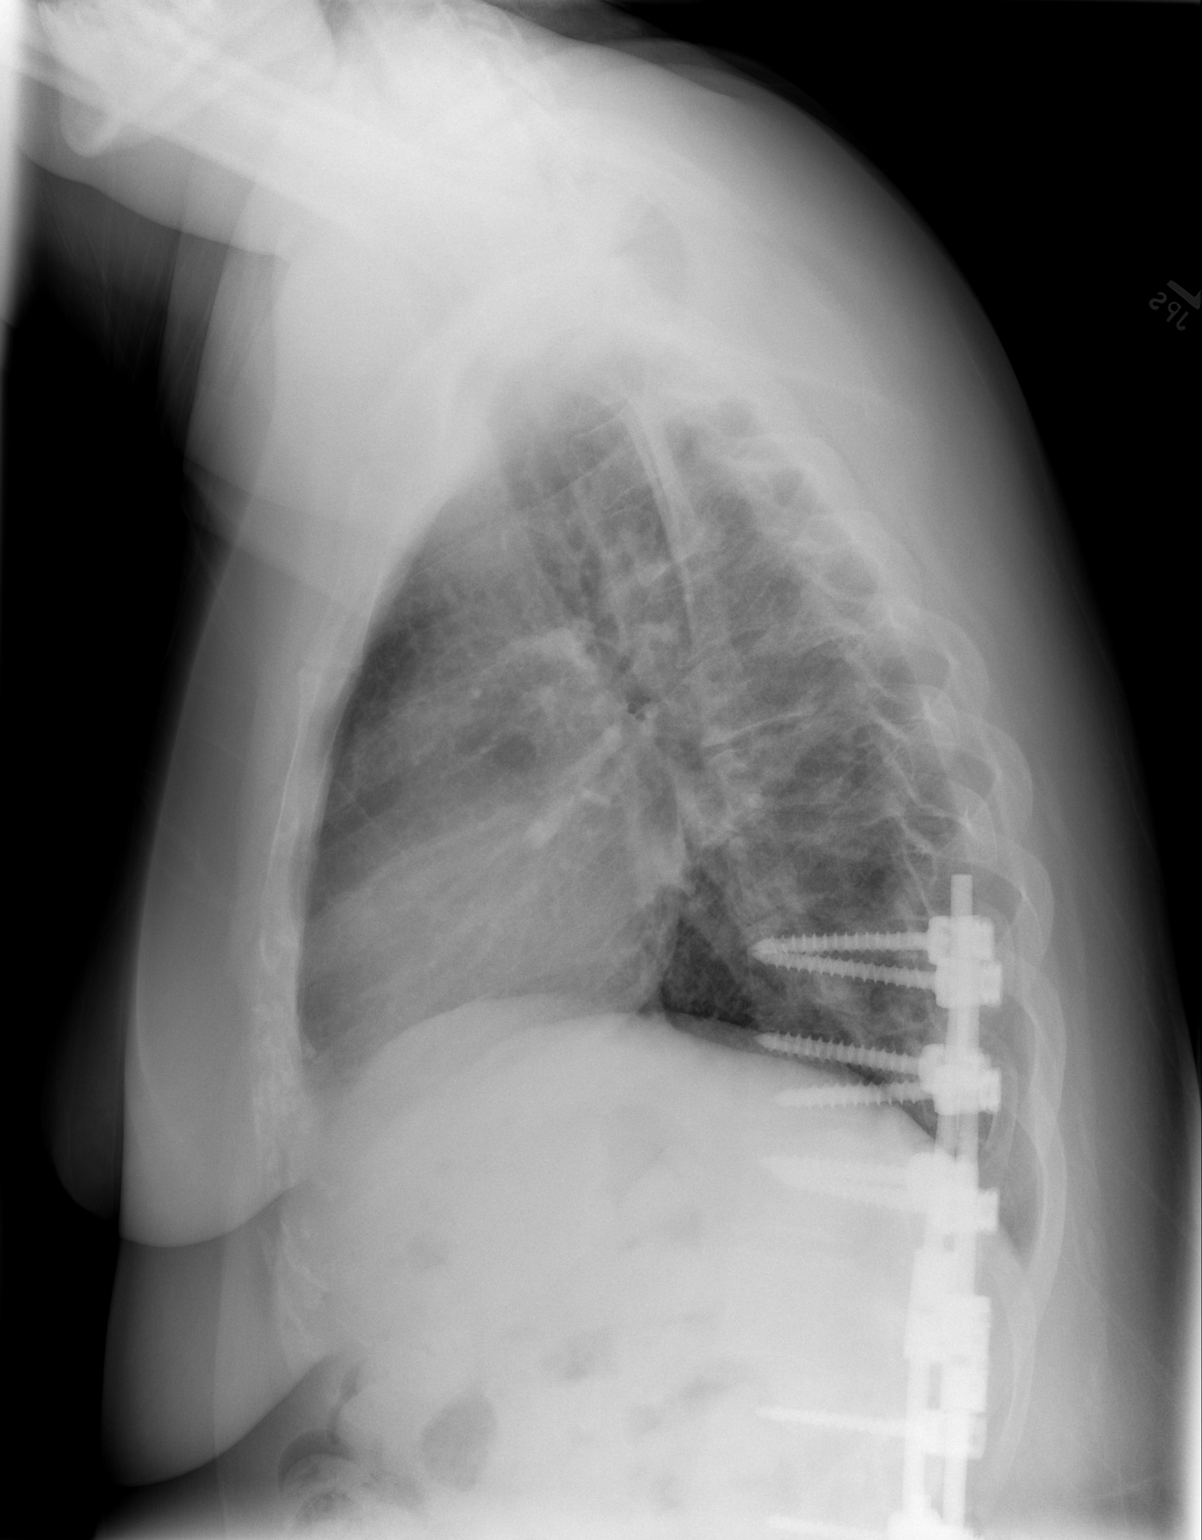

[2 of 2 positions shown; findings below may reference images not displayed]

FINDINGS: Mild peribronchial thickening.  Improving interstitial
prominence, likely improving interstitial edema.  Heart is upper
limits normal in size.  No confluent opacities or effusions.
Postoperative changes in the lower thoracic and visualized upper
lumbar spine.
IMPRESSION: Improving interstitial prominence.  Continued peribronchial
thickening.

## 2013-08-12 MED ORDER — BUTALBITAL-APAP-CAFFEINE 50-325-40 MG PO TABS: 2.0000 | ORAL_TABLET | Freq: Four times a day (QID) | ORAL | Status: DC | PRN

## 2013-08-12 NOTE — Addendum Note (Signed)
Addended by: Lewie Loron D on: 08/12/2013 02:49 PM   Modules accepted: Orders

## 2013-08-12 NOTE — Progress Notes (Signed)
Subjective:    [name] is a 33 y.o. female being seen today for her obstetrical visit. She is at [redacted]w[redacted]d gestation. Patient reports: headache . Fetal movement: normal.  Problem List Items Addressed This Visit   None    Visit Diagnoses   Encounter for supervision of other normal pregnancy in second trimester    -  Primary    Relevant Orders       POCT urinalysis dipstick    Cluster headache, not intractable, unspecified chronicity pattern        Relevant Medications       HYDROcodone-acetaminophen (NORCO) 10-325 MG per tablet       butalbital-acetaminophen-caffeine (FIORICET, ESGIC) 50-325-40 MG per tablet      Patient Active Problem List   Diagnosis Date Noted  . Supervision of high-risk pregnancy of elderly multigravida 04/10/2013  . Scoliosis 03/27/2013  . Folliculitis 98/26/4158  . GERD without esophagitis 06/12/2012  . Degeneration of intervertebral disc, site unspecified 12/28/2011  . Asthma exacerbation 10/04/2011  . Back pain 10/04/2011  . S/P lumbar spinal fusion 10/04/2011   Objective:    BP 119/76  Pulse 92  Wt 200 lb (90.719 kg)  LMP 02/16/2013 FHT: 150 BPM  Uterine Size: size equals dates     Assessment:    Pregnancy @ [redacted]w[redacted]d    Headache  Wants tubal ligation with C/S.  Plan:      Fioricet Rx    Rhogam next visit    Sign Tubal Papers next visit.   Labs, problem list reviewed and updated 2 hr GTT ordered Follow up in 2 weeks.

## 2013-08-13 LAB — CBC
HEMATOCRIT: 36.2 % (ref 36.0–46.0)
HEMOGLOBIN: 12.2 g/dL (ref 12.0–15.0)
MCH: 31.9 pg (ref 26.0–34.0)
MCHC: 33.7 g/dL (ref 30.0–36.0)
MCV: 94.8 fL (ref 78.0–100.0)
Platelets: 283 10*3/uL (ref 150–400)
RBC: 3.82 MIL/uL — ABNORMAL LOW (ref 3.87–5.11)
RDW: 13.5 % (ref 11.5–15.5)
WBC: 10.5 10*3/uL (ref 4.0–10.5)

## 2013-08-13 LAB — POCT URINALYSIS DIPSTICK
Bilirubin, UA: NEGATIVE
Glucose, UA: NEGATIVE
KETONES UA: NEGATIVE
Leukocytes, UA: NEGATIVE
Nitrite, UA: NEGATIVE
PH UA: 6
PROTEIN UA: NEGATIVE
RBC UA: NEGATIVE
Spec Grav, UA: 1.015
Urobilinogen, UA: NEGATIVE

## 2013-08-13 LAB — GLUCOSE TOLERANCE, 2 HOURS W/ 1HR
Glucose, 1 hour: 129 mg/dL (ref 70–170)
Glucose, 2 hour: 101 mg/dL (ref 70–139)
Glucose, Fasting: 86 mg/dL (ref 70–99)

## 2013-08-13 LAB — RPR

## 2013-08-13 LAB — HIV ANTIBODY (ROUTINE TESTING W REFLEX): HIV 1&2 Ab, 4th Generation: NONREACTIVE

## 2013-08-22 ENCOUNTER — Ambulatory Visit: Payer: Medicare HMO

## 2013-08-26 ENCOUNTER — Encounter: Payer: Medicare HMO | Admitting: Obstetrics

## 2013-08-26 ENCOUNTER — Ambulatory Visit (INDEPENDENT_AMBULATORY_CARE_PROVIDER_SITE_OTHER): Payer: Medicaid Other | Admitting: *Deleted

## 2013-08-26 VITALS — BP 115/70 | HR 103 | Temp 98.1°F | Ht 62.0 in | Wt 206.0 lb

## 2013-08-26 DIAGNOSIS — Z3483 Encounter for supervision of other normal pregnancy, third trimester: Secondary | ICD-10-CM

## 2013-08-26 DIAGNOSIS — O360990 Maternal care for other rhesus isoimmunization, unspecified trimester, not applicable or unspecified: Secondary | ICD-10-CM

## 2013-08-26 MED ORDER — RHO D IMMUNE GLOBULIN 1500 UNIT/2ML IJ SOSY
300.0000 ug | PREFILLED_SYRINGE | Freq: Once | INTRAMUSCULAR | Status: AC
  Administered 2013-08-26: 300 ug via INTRAMUSCULAR

## 2013-08-26 NOTE — Progress Notes (Signed)
Patient is in the office today for her Rhogam Injection. Patient is on time for her Injection. Injection given in Left Upper Outer Quadrant. Patient tolerated well. Patient advised to come back next week for her Calcium appointment. Patient voiced understanding. Patient stated she needed to sign her Tubal Ligation paper. Patient sent to [facility] to sign Tubal Ligation papers.   BP 115/70  Pulse 103  Temp(Src) 98.1 F (36.7 C)  Ht 5\' 2"  (1.575 m)  Wt 206 lb (93.441 kg)  BMI 37.67 kg/m2  LMP 02/16/2013  Administrations This Visit   rho (d) immune globulin (RHIG/RHOPHYLAC) injection 300 mcg   Administered Action Dose Route Administered By   08/26/2013 Given 300 mcg Intramuscular Ladona Ridgel, LPN

## 2013-09-02 ENCOUNTER — Encounter: Payer: Medicare HMO | Admitting: Obstetrics

## 2013-09-11 ENCOUNTER — Encounter: Payer: Self-pay | Admitting: Obstetrics

## 2013-09-11 ENCOUNTER — Ambulatory Visit (INDEPENDENT_AMBULATORY_CARE_PROVIDER_SITE_OTHER): Payer: Medicare HMO | Admitting: Obstetrics

## 2013-09-11 VITALS — BP 129/81 | HR 86 | Temp 98.4°F | Wt 205.0 lb

## 2013-09-11 DIAGNOSIS — Z3483 Encounter for supervision of other normal pregnancy, third trimester: Secondary | ICD-10-CM

## 2013-09-11 DIAGNOSIS — Z3480 Encounter for supervision of other normal pregnancy, unspecified trimester: Secondary | ICD-10-CM

## 2013-09-11 LAB — POCT URINALYSIS DIPSTICK
BILIRUBIN UA: NEGATIVE
Blood, UA: NEGATIVE
GLUCOSE UA: NEGATIVE
Ketones, UA: NEGATIVE
Leukocytes, UA: NEGATIVE
NITRITE UA: NEGATIVE
Protein, UA: NEGATIVE
Spec Grav, UA: 1.015
Urobilinogen, UA: NEGATIVE
pH, UA: 6.5

## 2013-09-11 NOTE — Progress Notes (Signed)
Subjective:    [name] is a 33 y.o. female being seen today for her obstetrical visit. She is at [redacted]w[redacted]d gestation. Patient reports no complaints. Fetal movement: normal.  Problem List Items Addressed This Visit   None    Visit Diagnoses   Encounter for supervision of other normal pregnancy in third trimester    -  Primary    Relevant Orders       POCT urinalysis dipstick (Completed)      Patient Active Problem List   Diagnosis Date Noted  . Supervision of high-risk pregnancy of elderly multigravida 04/10/2013  . Scoliosis 03/27/2013  . Folliculitis 29/92/4268  . GERD without esophagitis 06/12/2012  . Degeneration of intervertebral disc, site unspecified 12/28/2011  . Asthma exacerbation 10/04/2011  . Back pain 10/04/2011  . S/P lumbar spinal fusion 10/04/2011   Objective:    BP 129/81  Pulse 86  Temp(Src) 98.4 F (36.9 C)  Wt 205 lb (92.987 kg)  LMP 02/16/2013 FHT:  150 BPM  Uterine Size: size equals dates  Presentation: unsure     Assessment:    Pregnancy @ [redacted]w[redacted]d weeks   Plan:     labs reviewed, problem list updated Consent signed. GBS sent TDAP offered  Rhogam given for RH negative Pediatrician: discussed. Infant feeding: plans to breastfeed. Maternity leave: not discussed. Cigarette smoking: former smoker. Orders Placed This Encounter  Procedures  . POCT urinalysis dipstick   No orders of the defined types were placed in this encounter.   Follow up in 2 Weeks.

## 2013-09-24 ENCOUNTER — Ambulatory Visit (INDEPENDENT_AMBULATORY_CARE_PROVIDER_SITE_OTHER): Payer: Medicare HMO | Admitting: Obstetrics

## 2013-09-24 ENCOUNTER — Encounter: Payer: Self-pay | Admitting: Obstetrics

## 2013-09-24 VITALS — Temp 97.9°F | Wt 210.0 lb

## 2013-09-24 DIAGNOSIS — Z3483 Encounter for supervision of other normal pregnancy, third trimester: Secondary | ICD-10-CM

## 2013-09-24 DIAGNOSIS — M545 Low back pain, unspecified: Secondary | ICD-10-CM

## 2013-09-24 DIAGNOSIS — Z3480 Encounter for supervision of other normal pregnancy, unspecified trimester: Secondary | ICD-10-CM

## 2013-09-24 LAB — POCT URINALYSIS DIPSTICK
Bilirubin, UA: NEGATIVE
Glucose, UA: NEGATIVE
Ketones, UA: NEGATIVE
Leukocytes, UA: NEGATIVE
NITRITE UA: NEGATIVE
Protein, UA: NEGATIVE
RBC UA: NEGATIVE
Spec Grav, UA: 1.01
Urobilinogen, UA: NEGATIVE
pH, UA: 7

## 2013-09-24 NOTE — Progress Notes (Signed)
Subjective:    [name] is a 33 y.o. female being seen today for her obstetrical visit. She is at [redacted]w[redacted]d gestation. Patient reports no complaints. Fetal movement: normal.  Problem List Items Addressed This Visit   None    Visit Diagnoses   Encounter for supervision of other normal pregnancy in third trimester    -  Primary    Relevant Orders       POCT urinalysis dipstick (Completed)      Patient Active Problem List   Diagnosis Date Noted  . Supervision of high-risk pregnancy of elderly multigravida 04/10/2013  . Scoliosis 03/27/2013  . Folliculitis 73/53/2992  . GERD without esophagitis 06/12/2012  . Degeneration of intervertebral disc, site unspecified 12/28/2011  . Asthma exacerbation 10/04/2011  . Back pain 10/04/2011  . S/P lumbar spinal fusion 10/04/2011   Objective:    Temp(Src) 97.9 F (36.6 C)  Wt 210 lb (95.255 kg)  LMP 02/16/2013 FHT:  150 BPM  Uterine Size: size equals dates  Presentation: unsure     Assessment:    Pregnancy @ [redacted]w[redacted]d weeks   Plan:     labs reviewed, problem list updated Consent signed. GBS sent TDAP offered  Rhogam given for RH negative Pediatrician: discussed. Infant feeding: plans to breastfeed. Maternity leave: discussed. Cigarette smoking: former smoker. Orders Placed This Encounter  Procedures  . POCT urinalysis dipstick   No orders of the defined types were placed in this encounter.   Follow up in 2 Weeks.

## 2013-09-25 MED ORDER — HYDROCODONE-ACETAMINOPHEN 10-325 MG PO TABS: 1.0000 | ORAL_TABLET | Freq: Four times a day (QID) | ORAL | Status: DC | PRN

## 2013-09-25 NOTE — Addendum Note (Signed)
Addended by: Shelly Bombard on: 09/25/2013 05:35 PM   Modules accepted: Orders

## 2013-10-01 ENCOUNTER — Other Ambulatory Visit: Payer: Self-pay | Admitting: Obstetrics

## 2013-10-02 ENCOUNTER — Encounter: Payer: Self-pay | Admitting: *Deleted

## 2013-10-02 NOTE — Progress Notes (Signed)
Discussed with Dr Jodi Mourning. Patient has Hx of Difficult intubation. As well she has spinal stenosis(?) or some other back related issue. She is early pregnant. Wants an anesthesia consult.

## 2013-10-08 ENCOUNTER — Encounter: Payer: Self-pay | Admitting: Obstetrics

## 2013-10-08 ENCOUNTER — Ambulatory Visit (INDEPENDENT_AMBULATORY_CARE_PROVIDER_SITE_OTHER): Payer: Medicaid Other | Admitting: Obstetrics

## 2013-10-08 VITALS — BP 131/83 | HR 83 | Wt 214.0 lb

## 2013-10-08 DIAGNOSIS — M545 Low back pain, unspecified: Secondary | ICD-10-CM

## 2013-10-08 DIAGNOSIS — Z3483 Encounter for supervision of other normal pregnancy, third trimester: Secondary | ICD-10-CM

## 2013-10-08 DIAGNOSIS — Z23 Encounter for immunization: Secondary | ICD-10-CM

## 2013-10-08 DIAGNOSIS — Z3480 Encounter for supervision of other normal pregnancy, unspecified trimester: Secondary | ICD-10-CM

## 2013-10-08 LAB — POCT URINALYSIS DIPSTICK
Bilirubin, UA: NEGATIVE
Blood, UA: NEGATIVE
Glucose, UA: 50
Ketones, UA: NEGATIVE
Leukocytes, UA: NEGATIVE
Nitrite, UA: NEGATIVE
Protein, UA: NEGATIVE
SPEC GRAV UA: 1.015
Urobilinogen, UA: NEGATIVE
pH, UA: 7

## 2013-10-08 MED ORDER — HYDROCODONE-ACETAMINOPHEN 10-325 MG PO TABS: 1.0000 | ORAL_TABLET | Freq: Four times a day (QID) | ORAL | Status: DC | PRN

## 2013-10-08 MED ORDER — INFLUENZA VAC SPLIT QUAD 0.5 ML IM SUSY: 0.5000 mL | PREFILLED_SYRINGE | INTRAMUSCULAR | Status: DC

## 2013-10-08 MED ORDER — TETANUS-DIPHTH-ACELL PERTUSSIS 5-2.5-18.5 LF-MCG/0.5 IM SUSP: 0.5000 mL | Freq: Once | INTRAMUSCULAR | Status: DC

## 2013-10-08 NOTE — Progress Notes (Signed)
Subjective:    [name] is a 33 y.o. female being seen today for her obstetrical visit. She is at [redacted]w[redacted]d gestation. Patient reports backache. Fetal movement: normal.  Problem List Items Addressed This Visit   Back pain   Relevant Medications      HYDROcodone-acetaminophen (NORCO) 10-325 MG per tablet    Other Visit Diagnoses   Encounter for supervision of other normal pregnancy in third trimester    -  Primary    Relevant Medications       Tdap (BOOSTRIX) injection 0.5 mL       Influenza vac split quadrivalent PF (FLUARIX) injection 0.5 mL (Start on 10/09/2013 10:00 AM)    Other Relevant Orders       POCT urinalysis dipstick (Completed)      Patient Active Problem List   Diagnosis Date Noted  . Supervision of high-risk pregnancy of elderly multigravida 04/10/2013  . Scoliosis 03/27/2013  . Folliculitis 84/16/6063  . GERD without esophagitis 06/12/2012  . Degeneration of intervertebral disc, site unspecified 12/28/2011  . Asthma exacerbation 10/04/2011  . Back pain 10/04/2011  . S/P lumbar spinal fusion 10/04/2011   Objective:    BP 131/83  Pulse 83  Wt 214 lb (97.07 kg)  LMP 02/16/2013 FHT:  150 BPM  Uterine Size: size equals dates  Presentation: unsure     Assessment:    Pregnancy @ [redacted]w[redacted]d weeks   Plan:     labs reviewed, problem list updated Consent signed. GBS sent TDAP offered  Rhogam given for RH negative Pediatrician: discussed. Infant feeding: plans to breastfeed. Maternity leave: discussed. Cigarette smoking: Former smoker. Orders Placed This Encounter  Procedures  . POCT urinalysis dipstick   Meds ordered this encounter  Medications  . HYDROcodone-acetaminophen (NORCO) 10-325 MG per tablet    Sig: Take 1 tablet by mouth every 6 (six) hours as needed for moderate pain or severe pain.    Dispense:  40 tablet    Refill:  0  . Tdap (BOOSTRIX) injection 0.5 mL    Sig:   . Influenza vac split quadrivalent PF (FLUARIX) injection 0.5 mL   Sig:    Follow up in 1 Week.

## 2013-10-09 ENCOUNTER — Telehealth: Payer: Self-pay | Admitting: *Deleted

## 2013-10-09 NOTE — Telephone Encounter (Signed)
Patient has spoken to (Dr Primitivo Gauze- ?) at [facility] regarding her need for a consult appointment prior to her C-section. Patient states she was told that she needed to see someone the day of her pre-op. Patient is concerned that if she goes into labor before that- that they will not be prepared for her. She is asking if doctor Jodi Mourning will go ahead and schedule her because the day discussed already has 3 C sections scheduled at the hospital.

## 2013-10-10 ENCOUNTER — Other Ambulatory Visit: Payer: Self-pay | Admitting: *Deleted

## 2013-10-10 NOTE — Telephone Encounter (Signed)
CS scheduled.

## 2013-10-10 NOTE — Anesthesia Preprocedure Evaluation (Addendum)
Anesthesia Evaluation  Patient identified by MRN, date of birth, ID band Patient awake    Reviewed: Allergy & Precautions, H&P , NPO status , Patient's Chart, lab work & pertinent test results  History of Anesthesia Complications (+) PONV, DIFFICULT AIRWAY, Family history of anesthesia reaction and history of anesthetic complications (GA for last C/S after multiple SAB/epid attempts were unsuccessful.  patient reports she was a difficult airway during her last C/S (but last two GA for spinal surgeries in 2013  were atraumatic  DVL with MAC 3 x1),)  Airway Mallampati: III TM Distance: >3 FB Neck ROM: Full    Dental no notable dental hx. (+) Teeth Intact   Pulmonary shortness of breath and with exertion, asthma , pneumonia - (yearly), former smoker,  breath sounds clear to auscultation  Pulmonary exam normal       Cardiovascular +CHF (h/o with preeclampsia in 2006) Rhythm:Regular Rate:Normal     Neuro/Psych  Headaches (migraines), Anxiety H/o 6 spinal surgeries - extensive thoracolumbar fusion (from T8-S1) Axial low back pain - chronically on vicodin Coolness of right foot Pain left leg - posterior Numbness right leg  Neuromuscular disease    GI/Hepatic Neg liver ROS, GERD-  Medicated and Controlled,  Endo/Other  Morbid obesity  Renal/GU negative Renal ROS     Musculoskeletal  (+) Arthritis -, Chronic back pain S/P multiple back surgeries with instrumentation and fusions Hx/o Scoliosis DDD   Abdominal (+) + obese,   Peds  Hematology negative hematology ROS (+)   Anesthesia Other Findings   Reproductive/Obstetrics (+) Pregnancy (h/o prior C/S under GA (after multiple attempts at spinal/epidural))                        Anesthesia Physical Anesthesia Plan  ASA: III  Anesthesia Plan: General ETT, Rapid Sequence and Spinal   Post-op Pain Management:    Induction: Intravenous, Rapid sequence  and Cricoid pressure planned  Airway Management Planned: Oral ETT  Additional Equipment:   Intra-op Plan:   Post-operative Plan:   Informed Consent: I have reviewed the patients History and Physical, chart, labs and discussed the procedure including the risks, benefits and alternatives for the proposed anesthesia with the patient or authorized representative who has indicated his/her understanding and acceptance.   Dental advisory given  Plan Discussed with: Anesthesiologist, Surgeon and CRNA  Anesthesia Plan Comments: (Spoke with patient by phone [phone]) on 10/09/13.  She reports that in 09/2004 she had C/S under GA after multiple attempts at spinal and epidural were unsuccessful.  At that time she had had three prior back surgeries.  Since then she has had three more back surgeries.  She reports that her intubation for that C/S was difficult because she has "small air passages".  Intubation documentation in Epic for her last two back surgeries (in 8/13 and 12/13) shows uneventful DVL with Mac 3, grade 1 views, 1 attempt in both cases.  Discussed with patient that we would like to have an MDA see her during her PAT appointment.  Her C/S is currently scheduled for 11/08/13.  Spoke with Marcie Bal in PAT on 10/10/13 and reiterated our desire to meet Mrs Cullimore during her PAT appointment.  Charlton Haws, MD  Will make attempt at regional anesthesia and if unable to get spinal will do GA. Bryon Lions, MD)      Anesthesia Quick Evaluation

## 2013-10-15 ENCOUNTER — Encounter: Payer: Medicare HMO | Admitting: Obstetrics

## 2013-10-17 ENCOUNTER — Encounter: Payer: Self-pay | Admitting: Obstetrics

## 2013-10-17 ENCOUNTER — Ambulatory Visit (INDEPENDENT_AMBULATORY_CARE_PROVIDER_SITE_OTHER): Payer: Medicare HMO | Admitting: Obstetrics

## 2013-10-17 VITALS — BP 124/78 | HR 103 | Temp 98.0°F | Wt 214.0 lb

## 2013-10-17 DIAGNOSIS — Z3480 Encounter for supervision of other normal pregnancy, unspecified trimester: Secondary | ICD-10-CM

## 2013-10-17 DIAGNOSIS — Z3483 Encounter for supervision of other normal pregnancy, third trimester: Secondary | ICD-10-CM

## 2013-10-17 LAB — POCT URINALYSIS DIPSTICK
Bilirubin, UA: NEGATIVE
Blood, UA: NEGATIVE
Glucose, UA: NEGATIVE
KETONES UA: NEGATIVE
LEUKOCYTES UA: NEGATIVE
Nitrite, UA: NEGATIVE
PH UA: 6
PROTEIN UA: NEGATIVE
SPEC GRAV UA: 1.02
Urobilinogen, UA: NEGATIVE

## 2013-10-17 NOTE — Progress Notes (Signed)
Subjective:    [name] is a 33 y.o. female being seen today for her obstetrical visit. She is at [redacted]w[redacted]d gestation. Patient reports no complaints. Fetal movement: normal.  Problem List Items Addressed This Visit   None    Visit Diagnoses   Encounter for supervision of other normal pregnancy in third trimester    -  Primary    Relevant Orders       POCT urinalysis dipstick      Patient Active Problem List   Diagnosis Date Noted  . Supervision of high-risk pregnancy of elderly multigravida 04/10/2013  . Scoliosis 03/27/2013  . Folliculitis 96/75/9163  . GERD without esophagitis 06/12/2012  . Degeneration of intervertebral disc, site unspecified 12/28/2011  . Asthma exacerbation 10/04/2011  . Back pain 10/04/2011  . S/P lumbar spinal fusion 10/04/2011   Objective:    BP 124/78  Pulse 103  Temp(Src) 98 F (36.7 C)  Wt 214 lb (97.07 kg)  LMP 02/16/2013 FHT:  150 BPM  Uterine Size: size equals dates  Presentation: unsure     Assessment:    Pregnancy @ [redacted]w[redacted]d weeks   Plan:     labs reviewed, problem list updated Consent signed. GBS sent TDAP offered  Rhogam given for RH negative Pediatrician: discussed. Infant feeding: plans to breastfeed. Maternity leave: discussed. Cigarette smoking: former smoker. Orders Placed This Encounter  Procedures  . POCT urinalysis dipstick

## 2013-10-24 ENCOUNTER — Ambulatory Visit (INDEPENDENT_AMBULATORY_CARE_PROVIDER_SITE_OTHER): Payer: Medicare HMO | Admitting: Obstetrics

## 2013-10-24 VITALS — BP 124/82 | HR 91 | Temp 97.3°F | Wt 215.0 lb

## 2013-10-24 DIAGNOSIS — M545 Low back pain, unspecified: Secondary | ICD-10-CM

## 2013-10-24 DIAGNOSIS — Z3480 Encounter for supervision of other normal pregnancy, unspecified trimester: Secondary | ICD-10-CM

## 2013-10-24 DIAGNOSIS — Z3483 Encounter for supervision of other normal pregnancy, third trimester: Secondary | ICD-10-CM

## 2013-10-24 LAB — POCT URINALYSIS DIPSTICK
Bilirubin, UA: NEGATIVE
Glucose, UA: NEGATIVE
Ketones, UA: NEGATIVE
Leukocytes, UA: NEGATIVE
Nitrite, UA: NEGATIVE
PH UA: 6
RBC UA: NEGATIVE
SPEC GRAV UA: 1.015
Urobilinogen, UA: NEGATIVE

## 2013-10-24 MED ORDER — HYDROCODONE-ACETAMINOPHEN 10-325 MG PO TABS: 1.0000 | ORAL_TABLET | Freq: Four times a day (QID) | ORAL | Status: DC | PRN

## 2013-10-24 NOTE — Progress Notes (Signed)
Subjective:    [name] is a 33 y.o. female being seen today for her obstetrical visit. She is at 107w0d gestation. Patient reports no complaints. Fetal movement: normal.  Problem List Items Addressed This Visit   Back pain   Relevant Medications      HYDROcodone-acetaminophen (NORCO) 10-325 MG per tablet    Other Visit Diagnoses   Encounter for supervision of other normal pregnancy in third trimester    -  Primary    Relevant Orders       POCT urinalysis dipstick (Completed)      Patient Active Problem List   Diagnosis Date Noted  . Supervision of high-risk pregnancy of elderly multigravida 04/10/2013  . Scoliosis 03/27/2013  . Folliculitis 39/76/7341  . GERD without esophagitis 06/12/2012  . Degeneration of intervertebral disc, site unspecified 12/28/2011  . Asthma exacerbation 10/04/2011  . Back pain 10/04/2011  . S/P lumbar spinal fusion 10/04/2011    Objective:    BP 124/82  Pulse 91  Temp(Src) 97.3 F (36.3 C)  Wt 215 lb (97.523 kg)  LMP 02/16/2013 FHT: 150 BPM  Uterine Size: size equals dates  Presentations: unsure  Pelvic Exam: Deferred    Assessment:    Pregnancy @ [redacted]w[redacted]d weeks   Plan:   Plans for delivery: C/Section scheduled; labs reviewed; problem list updated Counseling: Consent signed. Infant feeding: plans to breastfeed. Cigarette smoking: former smoker. L&D discussion: symptoms of labor, discussed when to call, discussed what number to call, anesthetic/analgesic options reviewed and delivering clinician:  plans Physician. Postpartum supports and preparation: circumcision discussed and contraception plans discussed.  Follow up in 1 Week.

## 2013-10-29 ENCOUNTER — Encounter (HOSPITAL_COMMUNITY): Payer: Self-pay | Admitting: Pharmacist

## 2013-10-31 ENCOUNTER — Encounter: Payer: Medicare HMO | Admitting: Obstetrics

## 2013-11-05 ENCOUNTER — Inpatient Hospital Stay (HOSPITAL_COMMUNITY)
Admission: AD | Admit: 2013-11-05 | Discharge: 2013-11-06 | Disposition: A | Discharge status: Home / Self Care | Payer: Medicare HMO | Source: Ambulatory Visit | Attending: Obstetrics & Gynecology | Admitting: Obstetrics & Gynecology

## 2013-11-05 ENCOUNTER — Encounter (HOSPITAL_COMMUNITY): Payer: Self-pay | Admitting: *Deleted

## 2013-11-05 DIAGNOSIS — O479 False labor, unspecified: Secondary | ICD-10-CM | POA: Insufficient documentation

## 2013-11-05 NOTE — MAU Note (Signed)
Contractions, ?leaking fluid

## 2013-11-05 NOTE — Discharge Instructions (Signed)
Braxton Hicks Contractions Contractions of the uterus can occur throughout pregnancy. Contractions are not always a sign that you are in labor.  WHAT ARE BRAXTON HICKS CONTRACTIONS?  Contractions that occur before labor are called Braxton Hicks contractions, or false labor. Toward the end of pregnancy (32-34 weeks), these contractions can develop more often and may become more forceful. This is not true labor because these contractions do not result in opening (dilatation) and thinning of the cervix. They are sometimes difficult to tell apart from true labor because these contractions can be forceful and people have different pain tolerances. You should not feel embarrassed if you go to the hospital with false labor. Sometimes, the only way to tell if you are in true labor is for your health care provider to look for changes in the cervix. If there are no prenatal problems or other health problems associated with the pregnancy, it is completely safe to be sent home with false labor and await the onset of true labor. HOW CAN YOU TELL THE DIFFERENCE BETWEEN TRUE AND FALSE LABOR? False Labor  The contractions of false labor are usually shorter and not as hard as those of true labor.   The contractions are usually irregular.   The contractions are often felt in the front of the lower abdomen and in the groin.   The contractions may go away when you walk around or change positions while lying down.   The contractions get weaker and are shorter lasting as time goes on.   The contractions do not usually become progressively stronger, regular, and closer together as with true labor.  True Labor  Contractions in true labor last 30-70 seconds, become very regular, usually become more intense, and increase in frequency.   The contractions do not go away with walking.   The discomfort is usually felt in the top of the uterus and spreads to the lower abdomen and low back.   True labor can be  determined by your health care provider with an exam. This will show that the cervix is dilating and getting thinner.  WHAT TO REMEMBER  Keep up with your usual exercises and follow other instructions given by your health care provider.   Take medicines as directed by your health care provider.   Keep your regular prenatal appointments.   Eat and drink lightly if you think you are going into labor.   If Braxton Hicks contractions are making you uncomfortable:   Change your position from lying down or resting to walking, or from walking to resting.   Sit and rest in a tub of warm water.   Drink 2-3 glasses of water. Dehydration may cause these contractions.   Do slow and deep breathing several times an hour.  WHEN SHOULD I SEEK IMMEDIATE MEDICAL CARE? Seek immediate medical care if:  Your contractions become stronger, more regular, and closer together.   You have fluid leaking or gushing from your vagina.   You have a fever.   You pass blood-tinged mucus.   You have vaginal bleeding.   You have continuous abdominal pain.   You have low back pain that you never had before.   You feel your baby's head pushing down and causing pelvic pressure.   Your baby is not moving as much as it used to.  Document Released: 01/31/2005 Document Revised: 02/05/2013 Document Reviewed: 11/12/2012 ExitCare Patient Information 2015 [employer]. This information is not intended to replace advice given to you by your health care   provider. Make sure you discuss any questions you have with your health care provider.  Fetal Movement Counts Patient Name: __________________________________________________ Patient Due Date: ____________________ Performing a fetal movement count is highly recommended in high-risk pregnancies, but it is good for every pregnant woman to do. Your health care provider may ask you to start counting fetal movements at 28 weeks of the pregnancy. Fetal  movements often increase:  After eating a full meal.  After physical activity.  After eating or drinking something sweet or cold.  At rest. Pay attention to when you feel the baby is most active. This will help you notice a pattern of your baby's sleep and wake cycles and what factors contribute to an increase in fetal movement. It is important to perform a fetal movement count at the same time each day when your baby is normally most active.  HOW TO COUNT FETAL MOVEMENTS 1. Find a quiet and comfortable area to sit or lie down on your left side. Lying on your left side provides the best blood and oxygen circulation to your baby. 2. Write down the day and time on a sheet of paper or in a journal. 3. Start counting kicks, flutters, swishes, rolls, or jabs in a 2-hour period. You should feel at least 10 movements within 2 hours. 4. If you do not feel 10 movements in 2 hours, wait 2-3 hours and count again. Look for a change in the pattern or not enough counts in 2 hours. SEEK MEDICAL CARE IF:  You feel less than 10 counts in 2 hours, tried twice.  There is no movement in over an hour.  The pattern is changing or taking longer each day to reach 10 counts in 2 hours.  You feel the baby is not moving as he or she usually does. Date: ____________ Movements: ____________ Start time: ____________ Finish time: ____________  Date: ____________ Movements: ____________ Start time: ____________ Finish time: ____________ Date: ____________ Movements: ____________ Start time: ____________ Finish time: ____________ Date: ____________ Movements: ____________ Start time: ____________ Finish time: ____________ Date: ____________ Movements: ____________ Start time: ____________ Finish time: ____________ Date: ____________ Movements: ____________ Start time: ____________ Finish time: ____________ Date: ____________ Movements: ____________ Start time: ____________ Finish time: ____________ Date: ____________  Movements: ____________ Start time: ____________ Finish time: ____________  Date: ____________ Movements: ____________ Start time: ____________ Finish time: ____________ Date: ____________ Movements: ____________ Start time: ____________ Finish time: ____________ Date: ____________ Movements: ____________ Start time: ____________ Finish time: ____________ Date: ____________ Movements: ____________ Start time: ____________ Finish time: ____________ Date: ____________ Movements: ____________ Start time: ____________ Finish time: ____________ Date: ____________ Movements: ____________ Start time: ____________ Finish time: ____________ Date: ____________ Movements: ____________ Start time: ____________ Finish time: ____________  Date: ____________ Movements: ____________ Start time: ____________ Finish time: ____________ Date: ____________ Movements: ____________ Start time: ____________ Finish time: ____________ Date: ____________ Movements: ____________ Start time: ____________ Finish time: ____________ Date: ____________ Movements: ____________ Start time: ____________ Finish time: ____________ Date: ____________ Movements: ____________ Start time: ____________ Finish time: ____________ Date: ____________ Movements: ____________ Start time: ____________ Finish time: ____________ Date: ____________ Movements: ____________ Start time: ____________ Finish time: ____________  Date: ____________ Movements: ____________ Start time: ____________ Finish time: ____________ Date: ____________ Movements: ____________ Start time: ____________ Finish time: ____________ Date: ____________ Movements: ____________ Start time: ____________ Finish time: ____________ Date: ____________ Movements: ____________ Start time: ____________ Finish time: ____________ Date: ____________ Movements: ____________ Start time: ____________ Finish time: ____________ Date: ____________ Movements: ____________ Start time:  ____________ Finish time: ____________ Date: ____________ Movements:   ____________ Start time: ____________ Finish time: ____________  Date: ____________ Movements: ____________ Start time: ____________ Finish time: ____________ Date: ____________ Movements: ____________ Start time: ____________ Finish time: ____________ Date: ____________ Movements: ____________ Start time: ____________ Finish time: ____________ Date: ____________ Movements: ____________ Start time: ____________ Finish time: ____________ Date: ____________ Movements: ____________ Start time: ____________ Finish time: ____________ Date: ____________ Movements: ____________ Start time: ____________ Finish time: ____________ Date: ____________ Movements: ____________ Start time: ____________ Finish time: ____________  Date: ____________ Movements: ____________ Start time: ____________ Finish time: ____________ Date: ____________ Movements: ____________ Start time: ____________ Finish time: ____________ Date: ____________ Movements: ____________ Start time: ____________ Finish time: ____________ Date: ____________ Movements: ____________ Start time: ____________ Finish time: ____________ Date: ____________ Movements: ____________ Start time: ____________ Finish time: ____________ Date: ____________ Movements: ____________ Start time: ____________ Finish time: ____________ Date: ____________ Movements: ____________ Start time: ____________ Finish time: ____________  Date: ____________ Movements: ____________ Start time: ____________ Finish time: ____________ Date: ____________ Movements: ____________ Start time: ____________ Finish time: ____________ Date: ____________ Movements: ____________ Start time: ____________ Finish time: ____________ Date: ____________ Movements: ____________ Start time: ____________ Finish time: ____________ Date: ____________ Movements: ____________ Start time: ____________ Finish time: ____________ Date:  ____________ Movements: ____________ Start time: ____________ Finish time: ____________ Date: ____________ Movements: ____________ Start time: ____________ Finish time: ____________  Date: ____________ Movements: ____________ Start time: ____________ Finish time: ____________ Date: ____________ Movements: ____________ Start time: ____________ Finish time: ____________ Date: ____________ Movements: ____________ Start time: ____________ Finish time: ____________ Date: ____________ Movements: ____________ Start time: ____________ Finish time: ____________ Date: ____________ Movements: ____________ Start time: ____________ Finish time: ____________ Date: ____________ Movements: ____________ Start time: ____________ Finish time: ____________ Document Released: 03/02/2006 Document Revised: 06/17/2013 Document Reviewed: 11/28/2011 ExitCare Patient Information 2015 [employer]. This information is not intended to replace advice given to you by your health care provider. Make sure you discuss any questions you have with your health care provider.  

## 2013-11-05 NOTE — MAU Provider Note (Signed)
Ms. LENETTE RAU is a 33 y.o. G3P1011 at [redacted]w[redacted]d  who presents to MAU today complaining contractions and LOF. She denies vaginal bleeding. She states good fetal movement.   BP 117/71  Pulse 84  Temp(Src) 98.3 F (36.8 C) (Oral)  Resp 18  Ht 5\' 3"  (1.6 m)  Wt 216 lb 6 oz (98.147 kg)  BMI 38.34 kg/m2  SpO2 100%  LMP 02/16/2013 GENERAL: Well-developed, well-nourished female in no acute distress.  HEENT: Normocephalic, atraumatic.  LUNGS: Normal effort HEART: Regular rate  ABDOMEN: Soft, nontender, nondistended.  PELVIC: Normal external female genitalia. Vagina is pink and rugated.  Normal discharge.  negative pooling. Gravid uterus.   EXTREMITIES: No cyanosis, clubbing, or edema Dilation: Closed Effacement (%): Thick Cervical Position: Middle Exam by:: B Mosca RN  Fern - negative  Fetal Monitoring:  Baseline: 135 bpm, moderate variability, + accelerations, no decelerations Contractions: occasional, irregular  A: Membranes intact  P: Report given to RN to contact MD on call for further instructions  Luvenia Redden, PA-C 11/05/2013 11:05 PM

## 2013-11-05 NOTE — MAU Note (Signed)
Pt reports uc's q 3-5 minutes and "feeling wet"

## 2013-11-05 NOTE — Patient Instructions (Signed)
   Your procedure is scheduled on:  Friday, Sept 25  Enter through the [employer] [facility] at: [place] up the phone at the desk and dial [phone] and inform us of your arrival.  Please call this number if you have any problems the morning of surgery: [phone]  Remember: Do not eat or drink after midnight: Thursday Take these medicines the morning of surgery with a SIP OF WATER:  Do not wear jewelry, make-up, or FINGER nail polish No metal in your hair or on your body. Do not wear lotions, powders, perfumes.  You may wear deodorant.  Do not bring valuables to the hospital. Contacts, dentures or bridgework may not be worn into surgery.  Leave suitcase in the car. After Surgery it may be brought to your room. For patients being admitted to the hospital, checkout time is 11:00am the day of discharge.

## 2013-11-06 ENCOUNTER — Inpatient Hospital Stay (HOSPITAL_COMMUNITY)
Admission: RE | Admit: 2013-11-06 | Discharge: 2013-11-06 | Disposition: A | Discharge status: Home / Self Care | Payer: Medicare HMO | Source: Ambulatory Visit

## 2013-11-06 ENCOUNTER — Encounter (HOSPITAL_COMMUNITY)
Admission: RE | Admit: 2013-11-06 | Discharge: 2013-11-06 | Disposition: A | Discharge status: Home / Self Care | Payer: Medicare HMO | Source: Ambulatory Visit | Attending: Obstetrics | Admitting: Obstetrics

## 2013-11-06 ENCOUNTER — Encounter: Payer: Self-pay | Admitting: Obstetrics

## 2013-11-06 ENCOUNTER — Encounter (HOSPITAL_COMMUNITY): Payer: Self-pay

## 2013-11-06 ENCOUNTER — Ambulatory Visit (INDEPENDENT_AMBULATORY_CARE_PROVIDER_SITE_OTHER): Payer: Medicare HMO | Admitting: Obstetrics

## 2013-11-06 ENCOUNTER — Encounter: Payer: Medicare HMO | Admitting: Obstetrics

## 2013-11-06 VITALS — BP 132/87 | HR 85 | Temp 97.8°F | Wt 217.0 lb

## 2013-11-06 DIAGNOSIS — M62830 Muscle spasm of back: Secondary | ICD-10-CM

## 2013-11-06 DIAGNOSIS — Z3480 Encounter for supervision of other normal pregnancy, unspecified trimester: Secondary | ICD-10-CM

## 2013-11-06 DIAGNOSIS — M5380 Other specified dorsopathies, site unspecified: Secondary | ICD-10-CM

## 2013-11-06 DIAGNOSIS — Z3483 Encounter for supervision of other normal pregnancy, third trimester: Secondary | ICD-10-CM

## 2013-11-06 HISTORY — DX: Myoneural disorder, unspecified: G70.9

## 2013-11-06 LAB — POCT URINALYSIS DIPSTICK
BILIRUBIN UA: NEGATIVE
Blood, UA: NEGATIVE
Glucose, UA: NEGATIVE
KETONES UA: NEGATIVE
LEUKOCYTES UA: NEGATIVE
Nitrite, UA: NEGATIVE
PH UA: 6
Protein, UA: NEGATIVE
Spec Grav, UA: 1.02
Urobilinogen, UA: NEGATIVE

## 2013-11-06 LAB — ABO/RH: ABO/RH(D): A NEG

## 2013-11-06 LAB — CBC
HEMATOCRIT: 37.1 % (ref 36.0–46.0)
Hemoglobin: 12.4 g/dL (ref 12.0–15.0)
MCH: 32.4 pg (ref 26.0–34.0)
MCHC: 33.4 g/dL (ref 30.0–36.0)
MCV: 96.9 fL (ref 78.0–100.0)
Platelets: 214 10*3/uL (ref 150–400)
RBC: 3.83 MIL/uL — ABNORMAL LOW (ref 3.87–5.11)
RDW: 13.8 % (ref 11.5–15.5)
WBC: 11.2 10*3/uL — ABNORMAL HIGH (ref 4.0–10.5)

## 2013-11-06 LAB — RPR

## 2013-11-06 LAB — POCT FERN TEST: POCT Fern Test: NEGATIVE

## 2013-11-06 MED ORDER — OXYCODONE HCL 10 MG PO TABS: 10.0000 mg | ORAL_TABLET | Freq: Four times a day (QID) | ORAL | Status: DC | PRN

## 2013-11-06 NOTE — Pre-Procedure Instructions (Signed)
Dr Lyndle Herrlich saw this patient at her PAT appointment.  Requested 09/2004 C/S anesthesia report from [facility] at [facility].  Will fax report to [place] at [phone].

## 2013-11-06 NOTE — Pre-Procedure Instructions (Signed)
Requested fax received from [facility].  Spoke with Dr Lyndle Herrlich to let him know I had requested fax.  I was asked to put fax in his anesthesia office (in his desk chair).  Placed fax in anesthesia office as requested.

## 2013-11-06 NOTE — Patient Instructions (Addendum)
   Your procedure is scheduled on:  Friday, Sept 25  Enter through the [employer] [facility] at: [place] up the phone at the desk and dial [phone] and inform us of your arrival.  Please call this number if you have any problems the morning of surgery: [phone]  Remember: Do not eat or drink after midnight: Thursday Take these medicines the morning of surgery with a SIP OF WATER:  Prilosec.  Bring albuterol inhaler with you on day of surgery.  Do not wear jewelry, make-up, or FINGER nail polish No metal in your hair or on your body. Do not wear lotions, powders, perfumes.  You may wear deodorant.  Do not bring valuables to the hospital. Contacts, dentures or bridgework may not be worn into surgery.  Leave suitcase in the car. After Surgery it may be brought to your room. For patients being admitted to the hospital, checkout time is 11:00am the day of discharge.  Home with husband "Octavia Bruckner"  cell [phone]

## 2013-11-06 NOTE — Progress Notes (Signed)
Subjective:    [name] is a 33 y.o. female being seen today for her obstetrical visit. She is at [redacted]w[redacted]d gestation. Patient reports backache. Fetal movement: normal.  Problem List Items Addressed This Visit   None    Visit Diagnoses   Encounter for supervision of other normal pregnancy in third trimester    -  Primary    Relevant Orders       POCT urinalysis dipstick    Spasm of back muscles        Relevant Medications       Oxycodone HCl 10 MG TABS      Patient Active Problem List   Diagnosis Date Noted  . Supervision of high-risk pregnancy of elderly multigravida 04/10/2013  . Scoliosis 03/27/2013  . Folliculitis 00/71/2197  . GERD without esophagitis 06/12/2012  . Degeneration of intervertebral disc, site unspecified 12/28/2011  . Asthma exacerbation 10/04/2011  . Back pain 10/04/2011  . S/P lumbar spinal fusion 10/04/2011    Objective:    BP 132/87  Pulse 85  Temp(Src) 97.8 F (36.6 C)  Wt 217 lb (98.431 kg)  LMP 02/16/2013 FHT: 150 BPM  Uterine Size: size equals dates    Assessment:    Pregnancy @ [redacted]w[redacted]d weeks   Plan:   Plans for delivery: C/Section scheduled; labs reviewed; problem list updated Counseling: Consent signed. Infant feeding: plans to breastfeed. Cigarette smoking: quit 3 / 2015. L&D discussion: symptoms of labor, discussed when to call, discussed what number to call, anesthetic/analgesic options reviewed and delivering clinician:  plans Physician. Postpartum supports and preparation: circumcision discussed and contraception plans discussed.  Follow up in post op.

## 2013-11-07 MED ORDER — CEFAZOLIN SODIUM-DEXTROSE 2-3 GM-% IV SOLR: 2.0000 g | INTRAVENOUS | Status: DC

## 2013-11-08 ENCOUNTER — Encounter (HOSPITAL_COMMUNITY)
Admission: RE | Disposition: A | Discharge status: Home / Self Care | Payer: Self-pay | Source: Ambulatory Visit | Attending: Obstetrics

## 2013-11-08 ENCOUNTER — Inpatient Hospital Stay (HOSPITAL_COMMUNITY): Payer: Medicare HMO | Admitting: Anesthesiology

## 2013-11-08 ENCOUNTER — Encounter (HOSPITAL_COMMUNITY): Payer: Medicare HMO | Admitting: Anesthesiology

## 2013-11-08 ENCOUNTER — Inpatient Hospital Stay (HOSPITAL_COMMUNITY)
Admission: RE | Admit: 2013-11-08 | Discharge: 2013-11-10 | DRG: 766 | Disposition: A | Discharge status: Home / Self Care | Payer: Medicare HMO | Source: Ambulatory Visit | Attending: Obstetrics | Admitting: Obstetrics

## 2013-11-08 ENCOUNTER — Encounter (HOSPITAL_COMMUNITY): Payer: Self-pay | Admitting: Anesthesiology

## 2013-11-08 DIAGNOSIS — O99214 Obesity complicating childbirth: Secondary | ICD-10-CM

## 2013-11-08 DIAGNOSIS — K219 Gastro-esophageal reflux disease without esophagitis: Secondary | ICD-10-CM | POA: Diagnosis present

## 2013-11-08 DIAGNOSIS — F1210 Cannabis abuse, uncomplicated: Secondary | ICD-10-CM | POA: Diagnosis present

## 2013-11-08 DIAGNOSIS — O34219 Maternal care for unspecified type scar from previous cesarean delivery: Principal | ICD-10-CM | POA: Diagnosis present

## 2013-11-08 DIAGNOSIS — Z87891 Personal history of nicotine dependence: Secondary | ICD-10-CM

## 2013-11-08 DIAGNOSIS — O99344 Other mental disorders complicating childbirth: Secondary | ICD-10-CM | POA: Diagnosis present

## 2013-11-08 DIAGNOSIS — Z8249 Family history of ischemic heart disease and other diseases of the circulatory system: Secondary | ICD-10-CM | POA: Diagnosis not present

## 2013-11-08 DIAGNOSIS — E669 Obesity, unspecified: Secondary | ICD-10-CM | POA: Diagnosis present

## 2013-11-08 DIAGNOSIS — Z833 Family history of diabetes mellitus: Secondary | ICD-10-CM | POA: Diagnosis not present

## 2013-11-08 DIAGNOSIS — O09529 Supervision of elderly multigravida, unspecified trimester: Secondary | ICD-10-CM | POA: Diagnosis present

## 2013-11-08 DIAGNOSIS — Z6839 Body mass index (BMI) 39.0-39.9, adult: Secondary | ICD-10-CM

## 2013-11-08 DIAGNOSIS — Z8541 Personal history of malignant neoplasm of cervix uteri: Secondary | ICD-10-CM | POA: Diagnosis not present

## 2013-11-08 DIAGNOSIS — Z98891 History of uterine scar from previous surgery: Secondary | ICD-10-CM

## 2013-11-08 LAB — TYPE AND SCREEN
ABO/RH(D): A NEG
Antibody Screen: NEGATIVE

## 2013-11-08 LAB — PREPARE RBC (CROSSMATCH)

## 2013-11-08 SURGERY — Surgical Case
Anesthesia: General | Site: Abdomen

## 2013-11-08 MED ORDER — DIBUCAINE 1 % RE OINT
1.0000 | TOPICAL_OINTMENT | RECTAL | Status: DC | PRN
Start: 2013-11-08 — End: 2013-11-10

## 2013-11-08 MED ORDER — ONDANSETRON HCL 4 MG/2ML IJ SOLN: 4.0000 mg | Freq: Three times a day (TID) | INTRAMUSCULAR | Status: DC | PRN

## 2013-11-08 MED ORDER — CEFAZOLIN SODIUM-DEXTROSE 2-3 GM-% IV SOLR
INTRAVENOUS | Status: AC
  Administered 2013-11-08: 2 g via INTRAVENOUS
  Filled 2013-11-08: qty 50

## 2013-11-08 MED ORDER — NALBUPHINE HCL 10 MG/ML IJ SOLN: 5.0000 mg | Freq: Once | INTRAMUSCULAR | Status: AC | PRN

## 2013-11-08 MED ORDER — PHENYLEPHRINE 8 MG IN D5W 100 ML (0.08MG/ML) PREMIX OPTIME
INJECTION | INTRAVENOUS | Status: AC
  Filled 2013-11-08: qty 100

## 2013-11-08 MED ORDER — ONDANSETRON HCL 4 MG/2ML IJ SOLN: 4.0000 mg | Freq: Four times a day (QID) | INTRAMUSCULAR | Status: DC | PRN

## 2013-11-08 MED ORDER — OXYTOCIN 10 UNIT/ML IJ SOLN
INTRAMUSCULAR | Status: AC
  Filled 2013-11-08: qty 4

## 2013-11-08 MED ORDER — NALOXONE HCL 0.4 MG/ML IJ SOLN
0.4000 mg | INTRAMUSCULAR | Status: DC | PRN
Start: 2013-11-08 — End: 2013-11-10

## 2013-11-08 MED ORDER — SIMETHICONE 80 MG PO CHEW
80.0000 mg | CHEWABLE_TABLET | ORAL | Status: DC
  Administered 2013-11-09 – 2013-11-10 (×2): 80 mg via ORAL
  Filled 2013-11-08 (×2): qty 1

## 2013-11-08 MED ORDER — LIDOCAINE-EPINEPHRINE 2 %-1:100000 IJ SOLN
INTRAMUSCULAR | Status: DC | PRN
  Administered 2013-11-08: 40 mL via INTRADERMAL

## 2013-11-08 MED ORDER — WITCH HAZEL-GLYCERIN EX PADS: 1.0000 "application " | MEDICATED_PAD | CUTANEOUS | Status: DC | PRN

## 2013-11-08 MED ORDER — NALBUPHINE HCL 10 MG/ML IJ SOLN: 5.0000 mg | INTRAMUSCULAR | Status: DC | PRN

## 2013-11-08 MED ORDER — LANOLIN HYDROUS EX OINT: 1.0000 "application " | TOPICAL_OINTMENT | CUTANEOUS | Status: DC | PRN

## 2013-11-08 MED ORDER — NALOXONE HCL 1 MG/ML IJ SOLN
1.0000 ug/kg/h | INTRAVENOUS | Status: DC | PRN
  Filled 2013-11-08: qty 2

## 2013-11-08 MED ORDER — DIPHENHYDRAMINE HCL 25 MG PO CAPS: 25.0000 mg | ORAL_CAPSULE | Freq: Four times a day (QID) | ORAL | Status: DC | PRN

## 2013-11-08 MED ORDER — DEXAMETHASONE SODIUM PHOSPHATE 4 MG/ML IJ SOLN
INTRAMUSCULAR | Status: AC
  Filled 2013-11-08: qty 1

## 2013-11-08 MED ORDER — ZOLPIDEM TARTRATE 5 MG PO TABS: 5.0000 mg | ORAL_TABLET | Freq: Every evening | ORAL | Status: DC | PRN

## 2013-11-08 MED ORDER — DEXAMETHASONE SODIUM PHOSPHATE 10 MG/ML IJ SOLN
INTRAMUSCULAR | Status: DC | PRN
  Administered 2013-11-08: 6 mg via INTRAVENOUS

## 2013-11-08 MED ORDER — DIPHENHYDRAMINE HCL 25 MG PO CAPS
25.0000 mg | ORAL_CAPSULE | ORAL | Status: DC | PRN
  Filled 2013-11-08: qty 1

## 2013-11-08 MED ORDER — DIPHENHYDRAMINE HCL 50 MG/ML IJ SOLN: 12.5000 mg | INTRAMUSCULAR | Status: DC | PRN

## 2013-11-08 MED ORDER — HYDROMORPHONE HCL 1 MG/ML IJ SOLN
INTRAMUSCULAR | Status: AC
  Filled 2013-11-08: qty 1

## 2013-11-08 MED ORDER — KETOROLAC TROMETHAMINE 30 MG/ML IJ SOLN
30.0000 mg | Freq: Four times a day (QID) | INTRAMUSCULAR | Status: AC | PRN
  Administered 2013-11-08: 30 mg via INTRAMUSCULAR

## 2013-11-08 MED ORDER — BUPIVACAINE LIPOSOME 1.3 % IJ SUSP
20.0000 mL | Freq: Once | INTRAMUSCULAR | Status: AC
  Administered 2013-11-08: 20 mL
  Filled 2013-11-08: qty 20

## 2013-11-08 MED ORDER — SCOPOLAMINE 1 MG/3DAYS TD PT72
MEDICATED_PATCH | TRANSDERMAL | Status: AC
  Administered 2013-11-08: 1.5 mg via TRANSDERMAL
  Filled 2013-11-08: qty 1

## 2013-11-08 MED ORDER — SIMETHICONE 80 MG PO CHEW: 80.0000 mg | CHEWABLE_TABLET | ORAL | Status: DC | PRN

## 2013-11-08 MED ORDER — ONDANSETRON HCL 4 MG/2ML IJ SOLN
INTRAMUSCULAR | Status: AC
  Filled 2013-11-08: qty 2

## 2013-11-08 MED ORDER — GLYCOPYRROLATE 0.2 MG/ML IJ SOLN
0.4000 mg | Freq: Once | INTRAMUSCULAR | Status: AC
  Administered 2013-11-08: 0.4 mg via INTRAVENOUS
  Filled 2013-11-08 (×2): qty 2

## 2013-11-08 MED ORDER — SCOPOLAMINE 1 MG/3DAYS TD PT72
1.0000 | MEDICATED_PATCH | Freq: Once | TRANSDERMAL | Status: DC
  Administered 2013-11-08: 1.5 mg via TRANSDERMAL

## 2013-11-08 MED ORDER — ONDANSETRON HCL 4 MG PO TABS
4.0000 mg | ORAL_TABLET | ORAL | Status: DC | PRN
Start: 2013-11-08 — End: 2013-11-10

## 2013-11-08 MED ORDER — OXYTOCIN 40 UNITS IN LACTATED RINGERS INFUSION - SIMPLE MED: 62.5000 mL/h | INTRAVENOUS | Status: AC

## 2013-11-08 MED ORDER — TETANUS-DIPHTH-ACELL PERTUSSIS 5-2.5-18.5 LF-MCG/0.5 IM SUSP
0.5000 mL | Freq: Once | INTRAMUSCULAR | Status: DC
Start: 2013-11-09 — End: 2013-11-09

## 2013-11-08 MED ORDER — OXYCODONE-ACETAMINOPHEN 5-325 MG PO TABS: 1.0000 | ORAL_TABLET | ORAL | Status: DC | PRN

## 2013-11-08 MED ORDER — DIPHENHYDRAMINE HCL 12.5 MG/5ML PO ELIX
12.5000 mg | ORAL_SOLUTION | Freq: Four times a day (QID) | ORAL | Status: DC | PRN
  Filled 2013-11-08: qty 5

## 2013-11-08 MED ORDER — SUCCINYLCHOLINE CHLORIDE 20 MG/ML IJ SOLN
INTRAMUSCULAR | Status: AC
  Filled 2013-11-08: qty 10

## 2013-11-08 MED ORDER — SODIUM CHLORIDE 0.9 % IJ SOLN
INTRAMUSCULAR | Status: AC
  Filled 2013-11-08: qty 20

## 2013-11-08 MED ORDER — MORPHINE SULFATE 0.5 MG/ML IJ SOLN
INTRAMUSCULAR | Status: AC
  Filled 2013-11-08: qty 10

## 2013-11-08 MED ORDER — ONDANSETRON HCL 4 MG/2ML IJ SOLN
INTRAMUSCULAR | Status: DC | PRN
  Administered 2013-11-08: 4 mg via INTRAVENOUS

## 2013-11-08 MED ORDER — SODIUM CHLORIDE 0.9 % IJ SOLN
3.0000 mL | INTRAMUSCULAR | Status: DC | PRN
Start: 2013-11-08 — End: 2013-11-10

## 2013-11-08 MED ORDER — SIMETHICONE 80 MG PO CHEW
80.0000 mg | CHEWABLE_TABLET | Freq: Three times a day (TID) | ORAL | Status: DC
  Administered 2013-11-09 (×3): 80 mg via ORAL
  Filled 2013-11-08 (×4): qty 1

## 2013-11-08 MED ORDER — PROPOFOL 10 MG/ML IV BOLUS
INTRAVENOUS | Status: DC | PRN
  Administered 2013-11-08: 200 mg via INTRAVENOUS

## 2013-11-08 MED ORDER — SODIUM CHLORIDE 0.9 % IJ SOLN
INTRAMUSCULAR | Status: DC | PRN
  Administered 2013-11-08: 20 mL

## 2013-11-08 MED ORDER — OXYTOCIN 10 UNIT/ML IJ SOLN
40.0000 [IU] | INTRAVENOUS | Status: DC | PRN
  Administered 2013-11-08: 40 [IU] via INTRAVENOUS

## 2013-11-08 MED ORDER — NALOXONE HCL 0.4 MG/ML IJ SOLN: 0.4000 mg | INTRAMUSCULAR | Status: DC | PRN

## 2013-11-08 MED ORDER — KETOROLAC TROMETHAMINE 30 MG/ML IJ SOLN
30.0000 mg | Freq: Four times a day (QID) | INTRAMUSCULAR | Status: AC | PRN
  Administered 2013-11-08: 30 mg via INTRAVENOUS
  Filled 2013-11-08: qty 1

## 2013-11-08 MED ORDER — IBUPROFEN 600 MG PO TABS
600.0000 mg | ORAL_TABLET | Freq: Four times a day (QID) | ORAL | Status: DC
  Administered 2013-11-09 – 2013-11-10 (×7): 600 mg via ORAL
  Filled 2013-11-08 (×7): qty 1

## 2013-11-08 MED ORDER — HYDROMORPHONE 0.3 MG/ML IV SOLN
INTRAVENOUS | Status: DC
  Administered 2013-11-08 (×2): via INTRAVENOUS
  Administered 2013-11-09: 0.3 mL via INTRAVENOUS
  Filled 2013-11-08 (×2): qty 25

## 2013-11-08 MED ORDER — HYDROMORPHONE HCL 1 MG/ML IJ SOLN
INTRAMUSCULAR | Status: DC | PRN
  Administered 2013-11-08: 1 mg via INTRAVENOUS

## 2013-11-08 MED ORDER — MIDAZOLAM HCL 2 MG/2ML IJ SOLN
INTRAMUSCULAR | Status: AC
  Filled 2013-11-08: qty 2

## 2013-11-08 MED ORDER — KETOROLAC TROMETHAMINE 30 MG/ML IJ SOLN
INTRAMUSCULAR | Status: AC
  Filled 2013-11-08: qty 1

## 2013-11-08 MED ORDER — SODIUM CHLORIDE 0.9 % IJ SOLN: 9.0000 mL | INTRAMUSCULAR | Status: DC | PRN

## 2013-11-08 MED ORDER — FENTANYL CITRATE 0.05 MG/ML IJ SOLN
INTRAMUSCULAR | Status: AC
  Filled 2013-11-08: qty 2

## 2013-11-08 MED ORDER — SENNOSIDES-DOCUSATE SODIUM 8.6-50 MG PO TABS
2.0000 | ORAL_TABLET | ORAL | Status: DC
  Administered 2013-11-09 – 2013-11-10 (×2): 2 via ORAL
  Filled 2013-11-08 (×2): qty 2

## 2013-11-08 MED ORDER — LACTATED RINGERS IV SOLN
INTRAVENOUS | Status: DC
  Administered 2013-11-09: 04:00:00 via INTRAVENOUS

## 2013-11-08 MED ORDER — OXYCODONE-ACETAMINOPHEN 5-325 MG PO TABS
2.0000 | ORAL_TABLET | ORAL | Status: DC | PRN
  Administered 2013-11-09 – 2013-11-10 (×6): 2 via ORAL
  Filled 2013-11-08 (×6): qty 2

## 2013-11-08 MED ORDER — HYDROMORPHONE HCL 1 MG/ML IJ SOLN
0.5000 mg | INTRAMUSCULAR | Status: AC | PRN
  Administered 2013-11-08 (×4): 0.5 mg via INTRAVENOUS

## 2013-11-08 MED ORDER — MEPERIDINE HCL 25 MG/ML IJ SOLN: 6.2500 mg | INTRAMUSCULAR | Status: DC | PRN

## 2013-11-08 MED ORDER — LACTATED RINGERS IV SOLN
INTRAVENOUS | Status: DC
  Administered 2013-11-08 (×3): via INTRAVENOUS

## 2013-11-08 MED ORDER — PRENATAL MULTIVITAMIN CH
1.0000 | ORAL_TABLET | Freq: Every day | ORAL | Status: DC
  Administered 2013-11-09 – 2013-11-10 (×2): 1 via ORAL
  Filled 2013-11-08 (×2): qty 1

## 2013-11-08 MED ORDER — FENTANYL CITRATE 0.05 MG/ML IJ SOLN
INTRAMUSCULAR | Status: AC
Start: 2013-11-08 — End: 2013-11-08
  Filled 2013-11-08: qty 5

## 2013-11-08 MED ORDER — MENTHOL 3 MG MT LOZG
1.0000 | LOZENGE | OROMUCOSAL | Status: DC | PRN
  Administered 2013-11-09: 3 mg via ORAL
  Filled 2013-11-08: qty 9

## 2013-11-08 MED ORDER — ONDANSETRON HCL 4 MG/2ML IJ SOLN: 4.0000 mg | INTRAMUSCULAR | Status: DC | PRN

## 2013-11-08 MED ORDER — IBUPROFEN 600 MG PO TABS: 600.0000 mg | ORAL_TABLET | Freq: Four times a day (QID) | ORAL | Status: DC

## 2013-11-08 MED ORDER — DIPHENHYDRAMINE HCL 50 MG/ML IJ SOLN: 12.5000 mg | Freq: Four times a day (QID) | INTRAMUSCULAR | Status: DC | PRN

## 2013-11-08 MED ORDER — SUCCINYLCHOLINE CHLORIDE 20 MG/ML IJ SOLN
INTRAMUSCULAR | Status: DC | PRN
  Administered 2013-11-08: 120 mg via INTRAVENOUS

## 2013-11-08 MED ORDER — MIDAZOLAM HCL 5 MG/5ML IJ SOLN
INTRAMUSCULAR | Status: DC | PRN
  Administered 2013-11-08: 2 mg via INTRAVENOUS

## 2013-11-08 MED ORDER — LACTATED RINGERS IV SOLN
Freq: Once | INTRAVENOUS | Status: AC
  Administered 2013-11-08: 09:00:00 via INTRAVENOUS

## 2013-11-08 MED ORDER — HYDROMORPHONE HCL 1 MG/ML IJ SOLN
INTRAMUSCULAR | Status: AC
Start: 2013-11-08 — End: 2013-11-08
  Filled 2013-11-08: qty 1

## 2013-11-08 MED ORDER — FENTANYL CITRATE 0.05 MG/ML IJ SOLN
INTRAMUSCULAR | Status: DC | PRN
  Administered 2013-11-08: 250 ug via INTRAVENOUS
  Administered 2013-11-08: 75 ug via INTRAVENOUS

## 2013-11-08 SURGICAL SUPPLY — 45 items
CANISTER WOUND CARE 500ML ATS (WOUND CARE) IMPLANT
CLAMP CORD UMBIL (MISCELLANEOUS) IMPLANT
CLOTH BEACON ORANGE TIMEOUT ST (SAFETY) ×3 IMPLANT
CONTAINER PREFILL 10% NBF 15ML (MISCELLANEOUS) IMPLANT
COVER LIGHT HANDLE  1/PK (MISCELLANEOUS) ×4
COVER LIGHT HANDLE 1/PK (MISCELLANEOUS) ×2 IMPLANT
DERMABOND ADVANCED (GAUZE/BANDAGES/DRESSINGS) ×2
DERMABOND ADVANCED .7 DNX12 (GAUZE/BANDAGES/DRESSINGS) ×1 IMPLANT
DRAPE SHEET LG 3/4 BI-LAMINATE (DRAPES) IMPLANT
DRSG OPSITE POSTOP 4X10 (GAUZE/BANDAGES/DRESSINGS) ×3 IMPLANT
DRSG VAC ATS LRG SENSATRAC (GAUZE/BANDAGES/DRESSINGS) IMPLANT
DRSG VAC ATS MED SENSATRAC (GAUZE/BANDAGES/DRESSINGS) IMPLANT
DRSG VAC ATS SM SENSATRAC (GAUZE/BANDAGES/DRESSINGS) IMPLANT
DURAPREP 26ML APPLICATOR (WOUND CARE) ×3 IMPLANT
ELECT REM PT RETURN 9FT ADLT (ELECTROSURGICAL) ×3
ELECTRODE REM PT RTRN 9FT ADLT (ELECTROSURGICAL) ×1 IMPLANT
EXTRACTOR VACUUM M CUP 4 TUBE (SUCTIONS) IMPLANT
EXTRACTOR VACUUM M CUP 4' TUBE (SUCTIONS)
GLOVE BIO SURGEON STRL SZ8 (GLOVE) ×6 IMPLANT
GOWN STRL REUS W/TWL LRG LVL3 (GOWN DISPOSABLE) ×6 IMPLANT
KIT ABG SYR 3ML LUER SLIP (SYRINGE) IMPLANT
NEEDLE HYPO 22GX1.5 SAFETY (NEEDLE) ×3 IMPLANT
NEEDLE HYPO 25X5/8 SAFETYGLIDE (NEEDLE) ×3 IMPLANT
NS IRRIG 1000ML POUR BTL (IV SOLUTION) ×3 IMPLANT
PACK C SECTION WH (CUSTOM PROCEDURE TRAY) ×3 IMPLANT
PAD OB MATERNITY 4.3X12.25 (PERSONAL CARE ITEMS) ×3 IMPLANT
RTRCTR C-SECT PINK 25CM LRG (MISCELLANEOUS) ×3 IMPLANT
STAPLER VISISTAT 35W (STAPLE) ×3 IMPLANT
SUT GUT PLAIN 0 CT-3 TAN 27 (SUTURE) IMPLANT
SUT MNCRL 0 VIOLET CTX 36 (SUTURE) ×3 IMPLANT
SUT MNCRL AB 4-0 PS2 18 (SUTURE) IMPLANT
SUT MON AB 2-0 CT1 27 (SUTURE) ×3 IMPLANT
SUT MON AB 3-0 SH 27 (SUTURE)
SUT MON AB 3-0 SH27 (SUTURE) IMPLANT
SUT MONOCRYL 0 CTX 36 (SUTURE) ×6
SUT PDS AB 0 CTX 60 (SUTURE) IMPLANT
SUT PLAIN 2 0 XLH (SUTURE) IMPLANT
SUT VIC AB 0 CTX 36 (SUTURE)
SUT VIC AB 0 CTX36XBRD ANBCTRL (SUTURE) IMPLANT
SUT VIC AB 2-0 CT1 27 (SUTURE)
SUT VIC AB 2-0 CT1 TAPERPNT 27 (SUTURE) IMPLANT
SYR 20CC LL (SYRINGE) ×6 IMPLANT
TOWEL OR 17X24 6PK STRL BLUE (TOWEL DISPOSABLE) ×3 IMPLANT
TRAY FOLEY CATH 14FR (SET/KITS/TRAYS/PACK) ×3 IMPLANT
WATER STERILE IRR 1000ML POUR (IV SOLUTION) IMPLANT

## 2013-11-08 NOTE — H&P (Signed)
[name] is a 33 y.o. female presenting for repeat C/S. Maternal Medical History:  Fetal activity: Perceived fetal activity is normal.   Last perceived fetal movement was within the past hour.    Prenatal Complications - Diabetes: none.    OB History   Grav Para Term Preterm Abortions TAB SAB Ect Mult Living   3 1 1  1  1   1      Past Medical History  Diagnosis Date  . Family history of anesthesia complication     Nausea  . Respiratory failure 11/2010; 09/2011    Resolved  - History pneumonia  . Chronic back pain   . Asthma     "very badly" (01/17/2012)  . Chronic bronchitis     "@ least once/yr" (01/17/2012) - none since 01/2012  . Exertional dyspnea     Asthma  . GERD (gastroesophageal reflux disease)   . Degenerative disk disease   . Cervical cancer 1999; 2000  . Complication of anesthesia     back surgeries x 6 - fusions T8-S2- rods  . Difficult intubation     "needs small tube"  . PONV (postoperative nausea and vomiting)   . CHF (congestive heart failure) 2006    Resolved -History"with 2006 pregnancy, toxcemia "  not seeing a cardiologist now.  . Pneumonia     "~ once/yr" (01/17/2012) none since 01/2012  . Anxiety     no meds  . Migraines     last migraine 09/2013  . Neuromuscular disorder     sciatica  - affecting left leg numb/pain   Past Surgical History  Procedure Laterality Date  . Spinal fusion  10/04/2011    Procedure: FUSION POSTERIOR SPINAL MULTILEVEL;  Surgeon: Lowella Grip, MD;  Location: [place];  Service: Orthopedics;  Laterality: Bilateral;  repair lumbar pseudoarthritis, revision segmental fixation, L3-L4, L4-L5  . Cervical cone biopsy  1999; 2000    cervical cancer  . Adenoidectomy, tonsillectomy and myringotomy with tube placement  1983?  Marland Kitchen Tonsillectomy    . Knee arthroscopy  2004 X 2    "left X 2" (01/17/2012)  . Lumbar disc surgery  06/1998    "L4-5" (01/17/2012)  . Lumbar fusion  08/1998; 2010; 2012; 06/2011    "fusion cage;  put rods in; reanchored screws; rod broke" (01/17/2012)  . Anterior lumbar fusion  01/17/2012    Procedure: ANTERIOR LUMBAR FUSION 2 LEVELS;  Surgeon: Lowella Grip, MD;  Location: [place];  Service: Orthopedics;  Laterality: Bilateral;  Anterior Lumbar 3-4, Lumbar 4-5 fusion with bone graft harvest  . Anterior lumbar fusion     Family History: family history includes Cancer in an other family member; Deep vein thrombosis in her father; Diabetes in her father; Heart disease in her father; Hypertension in her father. Social History:  reports that she quit smoking about 6 months ago. Her smoking use included Cigarettes. She has a 7.5 pack-year smoking history. She has never used smokeless tobacco. She reports that she uses illicit drugs (Marijuana) about 5 times per week. She reports that she does not drink alcohol.   Prenatal Transfer Tool  Maternal Diabetes: No Genetic Screening: Normal Maternal Ultrasounds/Referrals: Referred to MFM for AMA. Fetal Ultrasounds or other Referrals:  None Maternal Substance Abuse:  No Significant Maternal Medications:  None Significant Maternal Lab Results:  None Other Comments:  None  Review of Systems  All other systems reviewed and are negative.     Pulse 85, temperature 97.7 F (36.5  C), temperature source Oral, resp. rate 18, last menstrual period 02/16/2013, SpO2 98.00%. Maternal Exam:  Abdomen: Patient reports no abdominal tenderness. Fetal presentation: vertex     Physical Exam  Nursing note and vitals reviewed. Constitutional: She is oriented to person, place, and time. She appears well-developed and well-nourished.  HENT:  Head: Normocephalic and atraumatic.  Eyes: Conjunctivae are normal. Pupils are equal, round, and reactive to light.  Neck: Normal range of motion. Neck supple.  Cardiovascular: Normal rate and regular rhythm.   Respiratory: Effort normal and breath sounds normal.  GI: Soft.  Musculoskeletal: Normal range of  motion.  Neurological: She is alert and oriented to person, place, and time.  Skin: Skin is warm and dry.  Psychiatric: She has a normal mood and affect. Her behavior is normal. Judgment and thought content normal.    Prenatal labs: ABO, Rh: --/--/A NEG, A NEG (09/23 1040) Antibody: NEG (09/23 1040) Rubella: 0.74 (02/25 1742) RPR: NON REAC (09/23 1040)  HBsAg: NEGATIVE (02/25 1742)  HIV: NONREACTIVE (06/29 1453)  GBS:     Assessment/Plan: 39 weeks.  Previous C/S.  For repeat C/S.   Merlene Dante A 11/08/2013, 8:27 AM

## 2013-11-08 NOTE — Anesthesia Postprocedure Evaluation (Signed)
  Anesthesia Post-op Note  Patient: [name]  Procedure(s) Performed: Procedure(s): REPEAT CESAREAN SECTION (N/A)  Patient Location: PACU  Anesthesia Type:General  Level of Consciousness: awake, alert  and oriented  Airway and Oxygen Therapy: Patient Spontanous Breathing  Post-op Pain: mild  Post-op Assessment: Post-op Vital signs reviewed, Patient's Cardiovascular Status Stable, Respiratory Function Stable, Patent Airway, No signs of Nausea or vomiting and Pain level controlled  Post-op Vital Signs: Reviewed and stable  Last Vitals:  Filed Vitals:   11/08/13 1100  BP: 147/87  Pulse: 92  Temp:   Resp: 18    Complications: No apparent anesthesia complications

## 2013-11-08 NOTE — Consult Note (Signed)
Neonatology Note:   Attendance at C-section:    I was asked by Dr. Jodi Mourning to attend this repeat C/S at term, being performed under general anesthesia due to extensive maternal history of back surgery and other medical problems. The mother is a G3P1A1 A neg, GBS not found on chart cigarette smoker who also used marijuana during the pregnancy.. She also takes oxycodone for pain at times.  ROM at delivery, fluid clear. Infant vigorous with good spontaneous cry and tone. Needed bulb suctioning several times for increased oral and nasal secretions. Ap 8/8. Lungs clear to ausc in DR. Pulse oximeter placed, showed O2 saturation at 6 minutes in room air was 86% and rising. First voided urine collected on cotton balls. Since mother was under general anesthesia, the baby was taken directly to the central nursery, with the father in attendance. Transferred to care of Pediatrician.   Real Cons, MD

## 2013-11-08 NOTE — Anesthesia Procedure Notes (Signed)
Spinal  Patient location during procedure: OR Start time: 11/08/2013 9:28 AM Staffing Anesthesiologist: Toran Murch A. Performed by: anesthesiologist  Preanesthetic Checklist Completed: patient identified, site marked, surgical consent, pre-op evaluation, timeout performed, IV checked, risks and benefits discussed and monitors and equipment checked Spinal Block Patient position: sitting Prep: site prepped and draped and DuraPrep Patient monitoring: heart rate, cardiac monitor, continuous pulse ox and blood pressure Approach: midline Location: L4-5 Injection technique: single-shot Needle Needle type: Sprotte  Needle gauge: 24 G Needle length: 9 cm Needle insertion depth: 7 cm Additional Notes Attempt x 3 at SAB unsuccessful. Will proceed with GA.

## 2013-11-08 NOTE — Op Note (Signed)
Cesarean Section Procedure Note   [name]   11/08/2013  Indications: Scheduled Proceedure/Maternal Request   Pre-operative Diagnosis: previous cesarean section.   Post-operative Diagnosis: Same   Procedure:  Repeat LTCS  Surgeon: Baltazar Najjar A  Assistants: Lahoma Crocker  Anesthesia: general  Procedure Details:  The patient was seen in the Holding Room. The risks, benefits, complications, treatment options, and expected outcomes were discussed with the patient. The patient concurred with the proposed plan, giving informed consent. The patient was identified as [name] and the procedure verified as C-Section Delivery. A Time Out was held and the above information confirmed.  After induction of anesthesia, the patient was draped and prepped in the usual sterile manner. A transverse incision was made and carried down through the subcutaneous tissue to the fascia. The fascial incision was made and extended transversely. The fascia was separated from the underlying rectus tissue superiorly and inferiorly. The peritoneum was identified and entered. The peritoneal incision was extended longitudinally. The utero-vesical peritoneal reflection was incised transversely and the bladder flap was bluntly freed from the lower uterine segment. A low transverse uterine incision was made. Delivered from cephalic presentation was a 2860 gram living newborn female infant(s). APGAR (1 MIN): 8   APGAR (5 MINS): 8   APGAR (10 MINS):    A cord ph was not sent. The umbilical cord was clamped and cut cord. A sample was obtained for evaluation. The placenta was removed Intact and appeared normal.  The uterine incision was closed with running locked sutures of 0 Monocryl. A second imbricating layer of the same suture was placed.  Hemostasis was observed. The paracolic gutters were irrigated. The parieto peritoneum was closed in a running fashion with 2-0 Monocryll.  The fascia was then  reapproximated with running sutures of 0 PDS.  The skin was closed with staples.  Instrument, sponge, and needle counts were correct prior the abdominal closure and were correct at the conclusion of the case.    Findings: Normal uterus, ovaries and tubes   Estimated Blood Loss: 632ml  Total IV Fluids: 2537ml   Urine Output: 300CC OF clear urine  Specimens: Placenta to L&D  Complications: no complications  Disposition: PACU - hemodynamically stable.  Maternal Condition: stable   Baby condition / location:  Couplet care / Skin to Skin    Signed: Surgeon(s): Shelly Bombard, MD

## 2013-11-08 NOTE — Addendum Note (Signed)
Addendum created 11/08/13 1149 by Venia Carbon. Royce Macadamia, MD   Modules edited: Orders, PRL Based Order Sets

## 2013-11-08 NOTE — Transfer of Care (Signed)
Immediate Anesthesia Transfer of Care Note  Patient: [name]  Procedure(s) Performed: Procedure(s): REPEAT CESAREAN SECTION (N/A)  Patient Location: PACU  Anesthesia Type:General  Level of Consciousness: awake, alert  and oriented  Airway & Oxygen Therapy: Patient Spontanous Breathing and Patient connected to nasal cannula oxygen  Post-op Assessment: Report given to PACU RN and Post -op Vital signs reviewed and stable  Post vital signs: Reviewed and stable  Complications: No apparent anesthesia complications

## 2013-11-09 LAB — CBC
HCT: 30.7 % — ABNORMAL LOW (ref 36.0–46.0)
HEMOGLOBIN: 10.4 g/dL — AB (ref 12.0–15.0)
MCH: 32.6 pg (ref 26.0–34.0)
MCHC: 33.9 g/dL (ref 30.0–36.0)
MCV: 96.2 fL (ref 78.0–100.0)
Platelets: 218 10*3/uL (ref 150–400)
RBC: 3.19 MIL/uL — AB (ref 3.87–5.11)
RDW: 13.7 % (ref 11.5–15.5)
WBC: 14.3 10*3/uL — AB (ref 4.0–10.5)

## 2013-11-09 LAB — BIRTH TISSUE RECOVERY COLLECTION (PLACENTA DONATION)

## 2013-11-09 MED ORDER — RHO D IMMUNE GLOBULIN 1500 UNIT/2ML IJ SOSY
300.0000 ug | PREFILLED_SYRINGE | Freq: Once | INTRAMUSCULAR | Status: AC
  Administered 2013-11-09: 300 ug via INTRAVENOUS
  Filled 2013-11-09: qty 2

## 2013-11-09 MED ORDER — MEASLES, MUMPS & RUBELLA VAC ~~LOC~~ INJ
0.5000 mL | INJECTION | Freq: Once | SUBCUTANEOUS | Status: AC
  Administered 2013-11-10: 0.5 mL via SUBCUTANEOUS
  Filled 2013-11-09 (×2): qty 0.5

## 2013-11-09 MED ORDER — FAMOTIDINE 20 MG PO TABS
20.0000 mg | ORAL_TABLET | Freq: Two times a day (BID) | ORAL | Status: DC
  Administered 2013-11-09 – 2013-11-10 (×2): 20 mg via ORAL
  Filled 2013-11-09 (×2): qty 1

## 2013-11-09 MED ORDER — MENTHOL 3 MG MT LOZG: 1.0000 | LOZENGE | OROMUCOSAL | Status: DC | PRN

## 2013-11-09 NOTE — Addendum Note (Signed)
Addendum created 11/09/13 1417 by Garner Nash, CRNA   Modules edited: Notes Section   Notes Section:  File: [id number]

## 2013-11-09 NOTE — Progress Notes (Signed)
Clinical Social Work Department PSYCHOSOCIAL ASSESSMENT - MATERNAL/CHILD 11/09/2013  Patient:  [name], [name]  Account Number:  [id number]  Admit Date:  11/08/2013  Ardine Eng Name:   Aniceto Boss    Clinical Social Worker:  Capri Veals, LCSW   Date/Time:  11/09/2013 10:00 AM  Date Referred:  11/08/2013   Referral source  [facility] Nurs[facility]     Referred reason  Substance Abuse   Other referral source:    I:  FAMILY / [place] legal guardian:  PARENT  Guardian - Name Guardian - Age Guardian - Address  LAILYNN, [place] [address]  [address] [place], [place] [address]   Other household support members/support persons Other support:    II  PSYCHOSOCIAL DATA Information Source:    Occupational hygienist Employment:   Mother is on disability   Financial resources:  Medicaid If Medicaid - [employer]:   Other  Physicist, medical  [place] / Grade:   Maternity Care Coordinator / Child Services Coordination / Early Interventions:  Cultural issues impacting care:    III  STRENGTHS Strengths  Supportive family/friends   Strength comment:    IV  RISK FACTORS AND CURRENT PROBLEMS Current Problem:       V  SOCIAL WORK ASSESSMENT Acknowledged order for Social Work consult to assess mother's history of marijuana and anxiety.  Informed that she is a single parent with one other dependent age 56. Mother states that she lives alone with her 35 year old and family is supportive.  FOB is  unemployed and she is on disability.  When asked about her hx of marijuana use, she became very defensive.  Mother informed of newborn's negative UDS, and that a follow up meconium drug screen will be done.  She replied "the UDS on the baby was negative and that should be sufficient" She further stated that it was a violation of her rights to do the testing on her baby if she refuse to consent.  Mother states that she will not consent to further testing.  She  reports hx of past DSS involvement with her family, and she does not want them in her affairs as this was not a positive experience. Mother informed that seeing that she admits to using marijuana during pregnancy and refusing testing on the baby, a referral will be made to DSS.   She continue to refuse further testing and threatened to call an attorney if further drug testing was done on the baby.  She stated "I made sure to stop using 2 months ago so there would be no drugs in the baby's system.  She would not discuss her hx of illicit substance, or reason for previous referral to DSS other than it was a false report made against her. She abruptly ended that interview and thanked CSW for visiting with her.    Informed RN caring for newborn and the Pediatrician of mother's request to have no further drug testing done.  Also informed them of referral that will be made to DSS.      VI SOCIAL WORK PLAN Social Work Plan  Child Scientist, forensic Report   Type of pt/family education:   If child protective services report - county:  [place] If child protective services report - date:  11/09/2013 Information/referral to community resources comment:   Case referred to [employer] [phone].  Informed that he will staff the case with his supervisor to determine whether it will be  investigated.   Other social work plan:   Will follow up with DSS on Sunday regarding case disposition

## 2013-11-09 NOTE — Progress Notes (Signed)
According to Beatriz Stallion day shift RN mother has refused to call RN to collect stool for UDS after SS worker told her why the MD ordered the stool collection. The UDS came back negative.

## 2013-11-09 NOTE — Anesthesia Postprocedure Evaluation (Signed)
  Anesthesia Post-op Note  Patient: [name]  Procedure(s) Performed: Procedure(s): REPEAT CESAREAN SECTION (N/A)  Patient Location: Mother/Baby  Anesthesia Type:General  Level of Consciousness: awake and alert   Airway and Oxygen Therapy: Patient Spontanous Breathing  Post-op Pain: mild  Post-op Assessment: Patient's Cardiovascular Status Stable, Respiratory Function Stable, No signs of Nausea or vomiting, Adequate PO intake, Pain level controlled, No headache, No residual numbness and No residual motor weakness  Post-op Vital Signs: stable  Last Vitals:  Filed Vitals:   11/09/13 1000  BP:   Pulse:   Temp:   Resp: 20    Complications: No apparent anesthesia complications

## 2013-11-09 NOTE — Lactation Note (Signed)
This note was copied from the chart of [place]. Lactation Consultation Note  Patient Name: [name] [id number] Date: 11/09/2013 Reason for consult: Follow-up assessment  Mom was feeding baby in cradle hold upon entering room with a semi-shallow latch.  Infant 45 hrs old at time of visit and has breastfed x3 (10-45 min) + 5 (5 min) + Formula x1 (documented 10 ml but mom states it was less) within first 24 hrs; voids-1; stools-1.  Mom consented for [facility] assistance.  Educated on supply and demand.  Lots basic education done; mom appreciative.  [facility] taught mom how to latch with asymmetrical latching technique using cross-cradle hold with depth and flanging bottom lip.  Infant latched with depth, few swallows heard.  LS-8.  Infant fed for 10 min on left breast and then mom independently latched infant to right breast in cross-cradle hold.  MJ Use During Pregnancy and Breastfeeding handout given to mom and mom openly discussed chronic back pain d/t previous 6 back surgeries.  Discussed mom's breastfeeding desire to breastfeed for at least the first 6 weeks; benefits of breastfeeding discussed.  Discussed risks of MJ Use during breastfeeding and mom verbalized understanding.  Discussed alternative coping methods and the existence of alternative pain meds she could discuss with MD and ask for something compatible with breastfeeding.  Size of infant's stomach and cluster feeding discussed.  Encouraged mom to feed with feeding cues.  Mom was appreciative of assistance and education from [facility].  Encouraged mom to call for assistance as needed.    Maternal Data    Feeding Feeding Type: Breast Fed Length of feed: 10 min  LATCH Score/Interventions Latch: Grasps breast easily, tongue down, lips flanged, rhythmical sucking.  Audible Swallowing: A few with stimulation Intervention(s): Hand expression;Skin to skin  Type of Nipple: Everted at rest and after stimulation  Comfort  (Breast/Nipple): Soft / non-tender     Hold (Positioning): Assistance needed to correctly position infant at breast and maintain latch. Intervention(s): Breastfeeding basics reviewed;Support Pillows;Skin to skin;Position options  LATCH Score: 8  Lactation Tools Discussed/Used     Consult Status Consult Status: Follow-up Date: 11/10/13 Follow-up type: In-patient    Merlene Laughter 11/09/2013, 5:04 PM

## 2013-11-09 NOTE — Progress Notes (Signed)
Patient ID: [name], female   DOB: 12-14-1980, 33 y.o.   MRN: [id number] Subjective: POD# 1 s/p Cesarean Delivery.  Indications: elective repeat  RH status/Rubella reviewed. Feeding: breast Patient reports tolerating PO.  Denies HA/SOB/C/P/N/V/dizziness.  Breast symptoms: No..  She reports vaginal bleeding as normal, without clots.  She is ambulating, urinating without difficulty.     Objective: Vital signs in last 24 hours: BP 99/56  Pulse 69  Temp(Src) 98.1 F (36.7 C) (Axillary)  Resp 18  Wt 98.431 kg (217 lb)  SpO2 97%  LMP 02/16/2013  Breastfeeding? Unknown   Total I/O In: -  Out: 100 [Urine:100]   Physical Exam:  General: alert CV: Regular rate and rhythm Resp: clear Abdomen: soft, nontender, normal bowel sounds Lochia: minimal Uterine Fundus: firm, below umbilicus, nontender Incision: clean, dry and intact Ext: extremities normal, atraumatic, no cyanosis or edema    Recent Labs  11/06/13 1040  HGB 12.4  HCT 37.1      Assessment/Plan: 33 y.o.  status post Cesarean section. POD# 1.   Doing well, stable.              Advance diet as tolerated Start po pain meds D/C foley  HLIV  Ambulate IS Routine post-op care  JACKSON-MOORE,Agueda Houpt A 11/09/2013, 9:06 AM

## 2013-11-10 DIAGNOSIS — O34219 Maternal care for unspecified type scar from previous cesarean delivery: Secondary | ICD-10-CM | POA: Diagnosis not present

## 2013-11-10 LAB — RH IG WORKUP (INCLUDES ABO/RH)
ABO/RH(D): A NEG
Fetal Screen: NEGATIVE
Gestational Age(Wks): 39
Unit division: 0

## 2013-11-10 MED ORDER — HYDROCODONE-ACETAMINOPHEN 10-325 MG PO TABS: 1.0000 | ORAL_TABLET | Freq: Four times a day (QID) | ORAL | Status: DC | PRN

## 2013-11-10 MED ORDER — NORETHINDRONE 0.35 MG PO TABS: 1.0000 | ORAL_TABLET | Freq: Every day | ORAL | Status: DC

## 2013-11-10 NOTE — Lactation Note (Addendum)
This note was copied from the chart of [place]. Lactation Consultation Note  Patient Name: [name] [id number] Date: 11/10/2013 Reason for consult: Follow-up assessment;Infant < 6lbs;Infant weight loss  Per mom my breast are feeling fuller and heavier. The last feeding was from a bottle. LC reviewed supply and demand and the importance of breast feeding 1st and then due to weight  Loss being at 8% to supplement up 30 ml until the 1st weight check at the [facility] and the amount can reassessed. Reviewed sore nipple and engorgement prevention and tx if needed.  Mom requested to rent a DEBP , active with [facility] it would be a Engineer, materials. LC discussed what the  [facility] loaner program entailed and per mom not able to afford it right now . LC provided a DEBP for mom and suggested  She call [place] for a Loaner tomorrow. Mom also has a Manual pump.  Mother informed of post-discharge support and given phone number to the lactation department, including services for phone  call assistance; out-patient appointments; and breastfeeding support group. List of other breastfeeding resources in the community  given in the handout. Encouraged mother to call for problems or concerns related to breastfeeding.   Maternal Data    Feeding Feeding Type: Bottle Fed - Formula Nipple Type: Slow - flow  LATCH Score/Interventions                Intervention(s): Breastfeeding basics reviewed     Lactation Tools Discussed/Used WIC Program: Yes (per mom [place] ) Pump Review: Setup, frequency, and cleaning;Milk Storage Initiated by:: MAI  Date initiated:: 11/10/13   Consult Status Consult Status: Complete Date: 11/10/13    Myer Haff 11/10/2013, 12:55 PM

## 2013-11-10 NOTE — Discharge Instructions (Signed)
Cesarean Delivery, Care After °Refer to this sheet in the next few weeks. These instructions provide you with information on caring for yourself after your procedure. Your health care provider may also give you specific instructions. Your treatment has been planned according to current medical practices, but problems sometimes occur. Call your health care provider if you have any problems or questions after you go home. °HOME CARE INSTRUCTIONS  °· Only take over-the-counter or prescription medications as directed by your health care provider. °· Do not drink alcohol, especially if you are breastfeeding or taking medication to relieve pain. °· Do not chew or smoke tobacco. °· Continue to use good perineal care. Good perineal care includes: °¨ Wiping your perineum from front to back. °¨ Keeping your perineum clean. °· Check your surgical cut (incision) daily for increased redness, drainage, swelling, or separation of skin. °· Clean your incision gently with soap and water every day, and then pat it dry. If your health care provider says it is okay, leave the incision uncovered. Use a bandage (dressing) if the incision is draining fluid or appears irritated. If the adhesive strips across the incision do not fall off within 7 days, carefully peel them off. °· Hug a pillow when coughing or sneezing until your incision is healed. This helps to relieve pain. °· Do not use tampons or douche until your health care provider says it is okay. °· Shower, wash your hair, and take tub baths as directed by your health care provider. °· Wear a well-fitting bra that provides breast support. °· Limit wearing support panties or control-top hose. °· Drink enough fluids to keep your urine clear or pale yellow. °· Eat high-fiber foods such as whole grain cereals and breads, brown rice, beans, and fresh fruits and vegetables every day. These foods may help prevent or relieve constipation. °· Resume activities such as climbing stairs,  driving, lifting, exercising, or traveling as directed by your health care provider. °· Talk to your health care provider about resuming sexual activities. This is dependent upon your risk of infection, your rate of healing, and your comfort and desire to resume sexual activity. °· Try to have someone help you with your household activities and your newborn for at least a few days after you leave the hospital. °· Rest as much as possible. Try to rest or take a nap when your newborn is sleeping. °· Increase your activities gradually. °· Keep all of your scheduled postpartum appointments. It is very important to keep your scheduled follow-up appointments. At these appointments, your health care provider will be checking to make sure that you are healing physically and emotionally. °SEEK MEDICAL CARE IF:  °· You are passing large clots from your vagina. Save any clots to show your health care provider. °· You have a foul smelling discharge from your vagina. °· You have trouble urinating. °· You are urinating frequently. °· You have pain when you urinate. °· You have a change in your bowel movements. °· You have increasing redness, pain, or swelling near your incision. °· You have pus draining from your incision. °· Your incision is separating. °· You have painful, hard, or reddened breasts. °· You have a severe headache. °· You have blurred vision or see spots. °· You feel sad or depressed. °· You have thoughts of hurting yourself or your newborn. °· You have questions about your care, the care of your newborn, or medications. °· You are dizzy or light-headed. °· You have a rash. °· You   have pain, redness, or swelling at the site of the removed intravenous access (IV) tube.  You have nausea or vomiting.  You stopped breastfeeding and have not had a menstrual period within 12 weeks of stopping.  You are not breastfeeding and have not had a menstrual period within 12 weeks of delivery.  You have a fever. SEEK  IMMEDIATE MEDICAL CARE IF:  You have persistent pain.  You have chest pain.  You have shortness of breath.  You faint.  You have leg pain.  You have stomach pain.  Your vaginal bleeding saturates 2 or more sanitary pads in 1 hour. MAKE SURE YOU:   Understand these instructions.  Will watch your condition.  Will get help right away if you are not doing well or get worse. Document Released: 10/23/2001 Document Revised: 06/17/2013 Document Reviewed: 09/28/2011 [facility] Patient Information 2015 [place], [place]. This information is not intended to replace advice given to you by your health care provider. Make sure you discuss any questions you have with your health care provider. Breast Pumping Tips If you are breastfeeding, there may be times when you cannot feed your baby directly. Returning to work or going on a trip are common examples. Pumping allows you to store breast milk and feed it to your baby later.  You may not get much milk when you first start to pump. Your breasts should start to make more after a few days. If you pump at the times you usually feed your baby, you may be able to keep making enough milk to feed your baby without also using formula. The more often you pump, the more milk you will produce. WHEN SHOULD I PUMP?   You can begin to pump soon after delivery. However, some experts recommend waiting about 4 weeks before giving your infant a bottle to make sure breastfeeding is going well.  If you plan to return to work, begin pumping a few weeks before. This will help you develop techniques that work best for you. It also lets you build up a supply of breast milk.   When you are with your infant, feed on demand and pump after each feeding.   When you are away from your infant for several hours, pump for about 15 minutes every 2-3 hours. Pump both breasts at the same time if you can.   If your infant has a formula feeding, make sure to pump around the same  time.   If you drink any alcohol, wait 2 hours before pumping.  HOW DO I PREPARE TO PUMP? Your let-down reflexis the natural reaction to stimulation that makes your breast milk flow. It is easier to stimulate this reflex when you are relaxed. Find relaxation techniques that work for you. If you have difficulty with your let-down reflex, try these methods:   Smell one of your infant's blankets or an item of clothing.   Look at a picture or video of your infant.   Sit in a quiet, private space.   Massage the breast you plan to pump.   Place soothing warmth on the breast.   Play relaxing music.  WHAT ARE SOME GENERAL BREAST PUMPING TIPS?  Wash your hands before you pump. You do not need to wash your nipples or breasts.  There are three ways to pump.  You can use your hand to massage and compress your breast.  You can use a handheld manual pump.  You can use an electric pump.   Make sure the  suction cup (flange) on the breast pump is the right size. Place the flange directly over the nipple. If it is the wrong size or placed the wrong way, it may be painful and cause nipple damage.   If pumping is uncomfortable, apply a small amount of purified or modified lanolin to your nipple and areola.  If you are using an electric pump, adjust the speed and suction power to be more comfortable.  If pumping is painful or if you are not getting very much milk, you may need a different type of pump. A lactation consultant can help you determine what type of pump to use.   Keep a full water bottle near you at all times. Drinking lots of fluid helps you make more milk.  You can store your milk to use later. Pumped breast milk can be stored in a sealable, sterile container or plastic bag. Label all stored breast milk with the date you pumped it.  Milk can stay out at room temperature for up to 8 hours.  You can store your milk in the refrigerator for up to 8 days.  You can  store your milk in the freezer for 3 months. Thaw frozen milk using warm water. Do not put it in the microwave.  Do not smoke. Smoking can lower your milk supply and harm your infant. If you need help quitting, ask your health care provider to recommend a program.  [place] A LACTATION CONSULTANT?  You are having trouble pumping.  You are concerned that you are not making enough milk.  You have nipple pain, soreness, or redness.  You want to use birth control. Birth control pills may lower your milk supply. Talk to your health care provider about your options. Document Released: 07/21/2009 Document Revised: 02/05/2013 Document Reviewed: 11/23/2012 [facility] Patient Information 2015 [place], [place]. This information is not intended to replace advice given to you by your health care provider. Make sure you discuss any questions you have with your health care provider.

## 2013-11-10 NOTE — Progress Notes (Signed)
Follow up call to [facility].  Informed that case was staffed with his supervisor and the decision was made to screen out the case.  DSS will not investigate.  Newborn may return home with mother.  Met with mother and informed her of DSS decision to not investigate.

## 2013-11-10 NOTE — Discharge Summary (Signed)
  Obstetric Discharge Summary Reason for Admission: cesarean section Prenatal Procedures: none Intrapartum Procedures: cesarean: low cervical, transverse Postpartum Procedures: none Complications-Operative and Postpartum: none  Hemoglobin  Date Value Ref Range Status  11/09/2013 10.4* 12.0 - 15.0 g/dL Final     HCT  Date Value Ref Range Status  11/09/2013 30.7* 36.0 - 46.0 % Final    Physical Exam:  General: alert Lochia: appropriate Uterine: firm Incision: scant dried heme DVT Evaluation: No evidence of DVT seen on physical exam.  Discharge Diagnoses: Active Problems:   S/P cesarean section   Discharge Information: Date: 11/10/2013 Activity: pelvic rest Diet: routine Medications:  Prior to Admission medications   Medication Sig Start Date End Date Taking? Authorizing Provider  albuterol (PROVENTIL HFA;VENTOLIN HFA) 108 (90 BASE) MCG/ACT inhaler Inhale 2 puffs into the lungs every 6 (six) hours as needed for wheezing or shortness of breath.   Yes Historical Provider, MD  butalbital-acetaminophen-caffeine (FIORICET, ESGIC) [phone] MG per tablet Take 1 tablet by mouth every 6 (six) hours as needed for headache. 08/12/13  Yes Shelly Bombard, MD  HYDROcodone-acetaminophen (NORCO) 10-325 MG per tablet Take 1 tablet by mouth every 6 (six) hours as needed for moderate pain or severe pain. 10/24/13  Yes Shelly Bombard, MD  loratadine (CLARITIN) 10 MG tablet Take 1 tablet (10 mg total) by mouth daily. 06/17/13  Yes Shelly Bombard, MD  omeprazole (PRILOSEC) 20 MG capsule Take 20 mg by mouth 2 (two) times daily as needed.   Yes Historical Provider, MD  Oxycodone HCl 10 MG TABS Take 1 tablet (10 mg total) by mouth every 6 (six) hours as needed. 11/06/13  Yes Shelly Bombard, MD  Prenatal-Fe Fum-Methf-FA w/o A (VITAFOL-NANO) 18-0.6-0.4 MG TABS Take 18 mg by mouth daily. 04/30/13  Yes Shelly Bombard, MD  HYDROcodone-acetaminophen [facility]) 10-325 MG per tablet Take 1 tablet by mouth  every 6 (six) hours as needed. 11/10/13   Lahoma Crocker, MD  norethindrone (ORTHO MICRONOR) 0.35 MG tablet Take 1 tablet (0.35 mg total) by mouth daily. 11/10/13   Lahoma Crocker, MD    Condition: stable Instructions: refer to routine discharge instructions Discharge to: home   Newborn Data:  Live born female  Birth Weight: 6 lb 4.9 oz (2860 g) APGAR: 8, 8   Home with mother.  JACKSON-MOORE,Blimi Godby A 11/10/2013, 7:56 AM

## 2013-11-11 ENCOUNTER — Encounter (HOSPITAL_COMMUNITY): Payer: Self-pay | Admitting: Obstetrics

## 2013-11-11 ENCOUNTER — Other Ambulatory Visit: Payer: Self-pay | Admitting: Obstetrics

## 2013-11-11 LAB — TYPE AND SCREEN
ABO/RH(D): A NEG
ANTIBODY SCREEN: NEGATIVE
Unit division: 0
Unit division: 0

## 2013-11-13 ENCOUNTER — Encounter: Payer: Self-pay | Admitting: Obstetrics

## 2013-11-13 ENCOUNTER — Ambulatory Visit (INDEPENDENT_AMBULATORY_CARE_PROVIDER_SITE_OTHER): Payer: Medicare HMO | Admitting: Obstetrics

## 2013-11-13 DIAGNOSIS — L7682 Other postprocedural complications of skin and subcutaneous tissue: Secondary | ICD-10-CM | POA: Insufficient documentation

## 2013-11-13 MED ORDER — OXYCODONE HCL 10 MG PO TABS: 10.0000 mg | ORAL_TABLET | Freq: Four times a day (QID) | ORAL | Status: DC | PRN

## 2013-11-13 NOTE — Progress Notes (Signed)
Subjective:     [name] is a 33 y.o. female who presents for a postpartum visit. She is 1 week postpartum following a low cervical transverse Cesarean section. I have fully reviewed the prenatal and intrapartum course. The delivery was at 35 gestational weeks. Outcome: primary cesarean section, low transverse incision. Anesthesia: general because of H/O spinal fusion. Postpartum course has been normal. Baby's course has been normal. Baby is feeding by breast. Bleeding thin lochia. Bowel function is normal. Bladder function is normal. Patient is not sexually active. Contraception method is abstinence. Postpartum depression screening: negative.  Tobacco, alcohol and substance abuse history reviewed.  Adult immunizations reviewed including TDAP, rubella and varicella.  The following portions of the patient's history were reviewed and updated as appropriate: allergies, current medications, past family history, past medical history, past social history, past surgical history and problem list.  Review of Systems A comprehensive review of systems was negative.   Objective:    BP 121/87  Pulse 93  Temp(Src) 99.1 F (37.3 C)  Wt 209 lb (94.802 kg)  LMP 02/16/2013  Breastfeeding? Yes   PE:       Abdomen:  Incision C, D, I.  Staples removed and steri strips applied.   Assessment:    Postpartum, 1 week.  Doing well.  Plan:    1. Contraception: IUD 2. Educational brochure dispensed 3. Follow up in: 2 weeks or as needed.   Healthy lifestyle practices reviewed

## 2013-11-15 ENCOUNTER — Telehealth: Payer: Self-pay | Admitting: *Deleted

## 2013-11-15 ENCOUNTER — Inpatient Hospital Stay (HOSPITAL_COMMUNITY)
Admission: AD | Admit: 2013-11-15 | Discharge: 2013-11-15 | Disposition: A | Discharge status: Home / Self Care | Payer: Medicare HMO | Source: Ambulatory Visit | Attending: Obstetrics & Gynecology | Admitting: Obstetrics & Gynecology

## 2013-11-15 ENCOUNTER — Encounter (HOSPITAL_COMMUNITY): Payer: Self-pay | Admitting: *Deleted

## 2013-11-15 DIAGNOSIS — Z87891 Personal history of nicotine dependence: Secondary | ICD-10-CM | POA: Diagnosis not present

## 2013-11-15 DIAGNOSIS — R21 Rash and other nonspecific skin eruption: Secondary | ICD-10-CM | POA: Diagnosis present

## 2013-11-15 DIAGNOSIS — L259 Unspecified contact dermatitis, unspecified cause: Secondary | ICD-10-CM | POA: Insufficient documentation

## 2013-11-15 MED ORDER — HYDROCODONE-ACETAMINOPHEN 10-325 MG PO TABS: 1.0000 | ORAL_TABLET | Freq: Four times a day (QID) | ORAL | Status: DC | PRN

## 2013-11-15 NOTE — MAU Note (Signed)
C/S on 9/25, had staples removed 2 days ago, steri strips were applied.  Pt states she has rash, itching & burning around incision, thinks it may be the steri strips. "Looks like my skin has been torched."  Scant amount vaginal bleeding.

## 2013-11-15 NOTE — Telephone Encounter (Signed)
Patient is still having symptoms at her incision.  12:15 LM on VM - patient needs to go to MAU to be evaluated.

## 2013-11-15 NOTE — Discharge Instructions (Signed)
Take 25-50 mg Benadryl every 6 hours.  Also, take Ibuprofen 600 mg every 6 hours.  Take Norco pain medication as needed.  Apply Neosporin cream as needed to incision site.  Use cool compresses as needed for itching.      Contact Dermatitis Contact dermatitis is a reaction to certain substances that touch the skin. Contact dermatitis can be either irritant contact dermatitis or allergic contact dermatitis. Irritant contact dermatitis does not require previous exposure to the substance for a reaction to occur.Allergic contact dermatitis only occurs if you have been exposed to the substance before. Upon a repeat exposure, your body reacts to the substance.  CAUSES  Many substances can cause contact dermatitis. Irritant dermatitis is most commonly caused by repeated exposure to mildly irritating substances, such as:  Makeup.  Soaps.  Detergents.  Bleaches.  Acids.  Metal salts, such as nickel. Allergic contact dermatitis is most commonly caused by exposure to:  Poisonous plants.  Chemicals (deodorants, shampoos).  Jewelry.  Latex.  Neomycin in triple antibiotic cream.  Preservatives in products, including clothing. SYMPTOMS  The area of skin that is exposed may develop:  Dryness or flaking.  Redness.  Cracks.  Itching.  Pain or a burning sensation.  Blisters. With allergic contact dermatitis, there may also be swelling in areas such as the eyelids, mouth, or genitals.  DIAGNOSIS  Your caregiver can usually tell what the problem is by doing a physical exam. In cases where the cause is uncertain and an allergic contact dermatitis is suspected, a patch skin test may be performed to help determine the cause of your dermatitis. TREATMENT Treatment includes protecting the skin from further contact with the irritating substance by avoiding that substance if possible. Barrier creams, powders, and gloves may be helpful. Your caregiver may also recommend:  Steroid creams or  ointments applied 2 times daily. For best results, soak the rash area in cool water for 20 minutes. Then apply the medicine. Cover the area with a plastic wrap. You can store the steroid cream in the refrigerator for a "chilly" effect on your rash. That may decrease itching. Oral steroid medicines may be needed in more severe cases.  Antibiotics or antibacterial ointments if a skin infection is present.  Antihistamine lotion or an antihistamine taken by mouth to ease itching.  Lubricants to keep moisture in your skin.  Burow's solution to reduce redness and soreness or to dry a weeping rash. Mix one packet or tablet of solution in 2 cups cool water. Dip a clean washcloth in the mixture, wring it out a bit, and put it on the affected area. Leave the cloth in place for 30 minutes. Do this as often as possible throughout the day.  Taking several cornstarch or baking soda baths daily if the area is too large to cover with a washcloth. Harsh chemicals, such as alkalis or acids, can cause skin damage that is like a burn. You should flush your skin for 15 to 20 minutes with cold water after such an exposure. You should also seek immediate medical care after exposure. Bandages (dressings), antibiotics, and pain medicine may be needed for severely irritated skin.  HOME CARE INSTRUCTIONS  Avoid the substance that caused your reaction.  Keep the area of skin that is affected away from hot water, soap, sunlight, chemicals, acidic substances, or anything else that would irritate your skin.  Do not scratch the rash. Scratching may cause the rash to become infected.  You may take cool baths to help  stop the itching.  Only take over-the-counter or prescription medicines as directed by your caregiver.  See your caregiver for follow-up care as directed to make sure your skin is healing properly. SEEK MEDICAL CARE IF:   Your condition is not better after 3 days of treatment.  You seem to be getting  worse.  You see signs of infection such as swelling, tenderness, redness, soreness, or warmth in the affected area.  You have any problems related to your medicines. Document Released: 01/29/2000 Document Revised: 04/25/2011 Document Reviewed: 07/06/2010 [facility] Patient Information 2015 [place], [place]. This information is not intended to replace advice given to you by your health care provider. Make sure you discuss any questions you have with your health care provider.

## 2013-11-15 NOTE — MAU Provider Note (Signed)
Chief Complaint: Post-op Problem   First Provider Initiated Contact with Patient 11/15/13 1844     SUBJECTIVE HPI: [name] is a 33 y.o. [id number] who presents to maternity admissions 1 week following LTCS reporting itching and pain at her incision site.  She was seen in the office 2 days ago and had the honeycomb dressing and staples removed.  Adhesive and steri-strips were applied at that time.  That night, she started having significant itching and applied alcohol to the area.  By the following morning, the entire area was red and burning.  She also reports back nerve pain, she thinks caused by her spinal anesthesia.  She has hx of back surgeries and chronic back pain.  She reports scant vaginal bleeding, denies vaginal itching/burning, urinary symptoms, h/a, dizziness, n/v, or fever/chills.    Past Medical History  Diagnosis Date  . Family history of anesthesia complication     Nausea  . Respiratory failure 11/2010; 09/2011    Resolved  - History pneumonia  . Chronic back pain   . Asthma     "very badly" (01/17/2012)  . Chronic bronchitis     "@ least once/yr" (01/17/2012) - none since 01/2012  . Exertional dyspnea     Asthma  . GERD (gastroesophageal reflux disease)   . Degenerative disk disease   . Cervical cancer 1999; 2000  . Complication of anesthesia     back surgeries x 6 - fusions T8-S2- rods  . Difficult intubation     "needs small tube"  . PONV (postoperative nausea and vomiting)   . CHF (congestive heart failure) 2006    Resolved -History"with 2006 pregnancy, toxcemia "  not seeing a cardiologist now.  . Pneumonia     "~ once/yr" (01/17/2012) none since 01/2012  . Anxiety     no meds  . Migraines     last migraine 09/2013  . Neuromuscular disorder     sciatica  - affecting left leg numb/pain   Past Surgical History  Procedure Laterality Date  . Spinal fusion  10/04/2011    Procedure: FUSION POSTERIOR SPINAL MULTILEVEL;  Surgeon: Lowella Grip, MD;   Location: [facility];  Service: Orthopedics;  Laterality: Bilateral;  repair lumbar pseudoarthritis, revision segmental fixation, L3-L4, L4-L5  . Cervical cone biopsy  1999; 2000    cervical cancer  . Adenoidectomy, tonsillectomy and myringotomy with tube placement  1983?  Marland Kitchen Tonsillectomy    . Knee arthroscopy  2004 X 2    "left X 2" (01/17/2012)  . Lumbar disc surgery  06/1998    "L4-5" (01/17/2012)  . Lumbar fusion  08/1998; 2010; 2012; 06/2011    "fusion cage; put rods in; reanchored screws; rod broke" (01/17/2012)  . Anterior lumbar fusion  01/17/2012    Procedure: ANTERIOR LUMBAR FUSION 2 LEVELS;  Surgeon: Lowella Grip, MD;  Location: [facility];  Service: Orthopedics;  Laterality: Bilateral;  Anterior Lumbar 3-4, Lumbar 4-5 fusion with bone graft harvest  . Anterior lumbar fusion    . Cesarean section N/A 11/08/2013    Procedure: REPEAT CESAREAN SECTION;  Surgeon: Shelly Bombard, MD;  Location: [facility];  Service: Obstetrics;  Laterality: N/A;   History   Social History  . Marital Status: Married    Spouse Name: N/A    Number of Children: 1  . Years of Education: N/A   Occupational History  . Disabled    Social History Main Topics  . Smoking status: Former Smoker -- 0.50 packs/day for  15 years    Types: Cigarettes    Quit date: 04/14/2013  . Smokeless tobacco: Never Used  . Alcohol Use: No  . Drug Use: No     Comment: last smoked today March 2015  . Sexual Activity: Not Currently    Partners: Male    Birth Control/ Protection: None   Other Topics Concern  . Not on file   Social History Narrative  . No narrative on file   No current facility-administered medications on file prior to encounter.   Current Outpatient Prescriptions on File Prior to Encounter  Medication Sig Dispense Refill  . albuterol (PROVENTIL HFA;VENTOLIN HFA) 108 (90 BASE) MCG/ACT inhaler Inhale 2 puffs into the lungs every 6 (six) hours as needed for wheezing or shortness of breath.      . loratadine  (CLARITIN) 10 MG tablet Take 1 tablet (10 mg total) by mouth daily.  30 tablet  11  . norethindrone (ORTHO MICRONOR) 0.35 MG tablet Take 1 tablet (0.35 mg total) by mouth daily.  28 tablet  11  . Oxycodone HCl 10 MG TABS Take 1 tablet (10 mg total) by mouth every 6 (six) hours as needed.  40 tablet  0  . Prenatal-Fe Fum-Methf-FA w/o A (VITAFOL-NANO) 18-0.6-0.4 MG TABS Take 18 mg by mouth daily.  30 tablet  11   Allergies  Allergen Reactions  . Ultram [Tramadol] Itching    ROS: Pertinent items in HPI  OBJECTIVE Blood pressure 114/74, pulse 80, temperature 98.7 F (37.1 C), temperature source Oral, resp. rate 20, last menstrual period 02/16/2013, currently breastfeeding. GENERAL: Well-developed, well-nourished female in no acute distress.  HEENT: Normocephalic HEART: normal rate RESP: normal effort ABDOMEN: Soft, non-tender, significant erythema and edema in exact square pattern where adhesive tape held on honeycomb dressing.  Incision well-approximated with no edema, only erythema matching surrounding area of irritation.   EXTREMITIES: Nontender, no edema NEURO: Alert and oriented   ASSESSMENT 1. Contact dermatitis     PLAN Consult Dr Delsa Sale Discharge home Bendadryl 25-50 mg Q 6 hours Ibuprofen 600 mg Q 6 hours Neosporin topically PRN Cool compresses PRN for itching Extend pt Norco Rx by 20 tabs for back pain F/U with Dr Delsa Sale next week Return to MAU as needed for emergencies  Addendum:  Norco Rx printed twice, initially printed on plain white paper, not Rx paper.    Medication List         albuterol 108 (90 BASE) MCG/ACT inhaler  Commonly known as:  PROVENTIL HFA;VENTOLIN HFA  Inhale 2 puffs into the lungs every 6 (six) hours as needed for wheezing or shortness of breath.     HYDROcodone-acetaminophen 10-325 MG per tablet  Commonly known as:  NORCO  Take 1 tablet by mouth every 6 (six) hours as needed.     loratadine 10 MG tablet  Commonly known  as:  CLARITIN  Take 1 tablet (10 mg total) by mouth daily.     norethindrone 0.35 MG tablet  Commonly known as:  ORTHO MICRONOR  Take 1 tablet (0.35 mg total) by mouth daily.     Oxycodone HCl 10 MG Tabs  Take 1 tablet (10 mg total) by mouth every 6 (six) hours as needed.     VITAFOL-NANO 18-0.6-0.4 MG Tabs  Take 18 mg by mouth daily.           Follow-up Information   Follow up with Lahoma Crocker A, MD. (As scheduled or sooner if symptoms persist)    Specialty:  Obstetrics and  Gynecology   Contact information:   [address] [place] [address] [phone]       Follow up with [facility]. (As needed for emergencies)    Contact information:   [address] [id number] [place] [address] [phone]      Fatima Blank Certified Nurse-Midwife 11/15/2013  7:20 PM

## 2013-11-18 ENCOUNTER — Telehealth: Payer: Self-pay | Admitting: *Deleted

## 2013-11-18 NOTE — Telephone Encounter (Signed)
Pt called to office regarding incision.  Pt states that she thinks she has an open area of incision. Return call to pt.  No answer, left message for pt to contact office if she would like to come in to office today for incision check today.  Pt advised to call office.

## 2013-11-25 ENCOUNTER — Ambulatory Visit (INDEPENDENT_AMBULATORY_CARE_PROVIDER_SITE_OTHER): Payer: Medicare HMO | Admitting: Obstetrics

## 2013-11-25 ENCOUNTER — Ambulatory Visit: Payer: Medicare HMO | Admitting: Obstetrics

## 2013-11-25 ENCOUNTER — Encounter: Payer: Self-pay | Admitting: Obstetrics

## 2013-11-25 DIAGNOSIS — L7682 Other postprocedural complications of skin and subcutaneous tissue: Secondary | ICD-10-CM

## 2013-11-25 DIAGNOSIS — R208 Other disturbances of skin sensation: Secondary | ICD-10-CM

## 2013-11-25 NOTE — Progress Notes (Signed)
   Subjective:    Patient ID: [name], female    DOB: 12-25-1980, 33 y.o.   MRN: [id number]  HPISmall sore in right corner of incision.    Review of Systems     Objective:   Physical Exam        Assessment & Plan:   Subjective:     [name] is a 33 y.o. female who presents for a postpartum visit. She is 2 weeks postpartum following a low cervical transverse Cesarean section. I have fully reviewed the prenatal and intrapartum course. The delivery was at 67 gestational weeks. Outcome: repeat cesarean section, low transverse incision. Anesthesia: general. Postpartum course has been normal. Baby's course has been normal. Baby is feeding by bottle [name]. Bleeding thin lochia. Bowel function is normal. Bladder function is normal. Patient is not sexually active. Contraception method is abstinence. Postpartum depression screening: negative.  Tobacco, alcohol and substance abuse history reviewed.  Adult immunizations reviewed including TDAP, rubella and varicella.  The following portions of the patient's history were reviewed and updated as appropriate: allergies, current medications, past family history, past medical history, past social history, past surgical history and problem list.  Review of Systems A comprehensive review of systems was negative.   Objective:    BP 138/88  Pulse 81  Temp(Src) 98.1 F (36.7 C)  Ht 5' 2.5" (1.588 m)  Breastfeeding? No  General:  alert and no distress   Breasts:   soft, nontender.  Lungs: clear to auscultation bilaterally  Heart:  regular rate and rhythm, S1, S2 normal, no murmur, click, rub or gallop  Abdomen: normal findings: soft, non-tender and incision C, D, I.   Vulva:  not evaluated  Vagina: not evaluated  Cervix:  not evaluated  Corpus: not examined  Adnexa:  not evaluated  Rectal Exam: Not performed.           Assessment:     Normal postpartum exam. Pap smear not done at today's visit.  Plan:    1.  Contraception: abstinence 2. Continue PNV's. 3. Follow up in: 4 weeks or as needed.   Healthy lifestyle practices reviewed

## 2013-11-27 ENCOUNTER — Telehealth: Payer: Self-pay | Admitting: *Deleted

## 2013-11-27 ENCOUNTER — Other Ambulatory Visit: Payer: Self-pay | Admitting: *Deleted

## 2013-11-27 DIAGNOSIS — M549 Dorsalgia, unspecified: Secondary | ICD-10-CM

## 2013-11-27 NOTE — Telephone Encounter (Signed)
Patient called back- she is having trouble getting pain medication- Dr Jodi Mourning spoke with patient and encouraged her to call back for medication at [facility]. He will do a referral to [facility] Spinal specialist

## 2013-11-27 NOTE — Telephone Encounter (Signed)
Patient called to talk to Dr Jodi Mourning regarding the trouble she is having with [facility]. 3:49 LM on VM to CB

## 2013-11-28 ENCOUNTER — Telehealth: Payer: Self-pay

## 2013-11-28 NOTE — Telephone Encounter (Signed)
CALLED PATIENT TO LET HER KNOW OF APPT DATE AND TIME AT [facility] [address] [address] [facility] - 12/25/13 @ 3PM - THEY WILL SEND HER A PATIENT LETTER AS WELL

## 2013-11-29 MED ORDER — OXYCODONE HCL 10 MG PO TABS: 10.0000 mg | ORAL_TABLET | Freq: Four times a day (QID) | ORAL | Status: DC | PRN

## 2013-11-29 NOTE — Addendum Note (Signed)
Addended by: Baltazar Najjar A on: 11/29/2013 12:18 PM   Modules accepted: Orders

## 2013-12-02 ENCOUNTER — Ambulatory Visit: Payer: Medicare HMO | Admitting: Obstetrics

## 2013-12-03 ENCOUNTER — Other Ambulatory Visit: Payer: Self-pay | Admitting: Orthopedic Surgery

## 2013-12-03 DIAGNOSIS — M545 Low back pain, unspecified: Secondary | ICD-10-CM

## 2013-12-05 ENCOUNTER — Encounter: Payer: Medicare HMO | Admitting: Neurology

## 2013-12-11 ENCOUNTER — Telehealth: Payer: Self-pay | Admitting: *Deleted

## 2013-12-11 NOTE — Telephone Encounter (Signed)
Patient has appointment on 11/11- [facility] and 11/12 Neurology appointment. Patient has imaging appointment also. Patient wants to know if he can refill her Rx until her appointment time.

## 2013-12-11 NOTE — Telephone Encounter (Signed)
Spoke with patient - told her her refill request was denied. Patient is upset because she is going to run out of medication before her appointment. Told patient I would ask if there was something else.

## 2013-12-12 ENCOUNTER — Emergency Department (HOSPITAL_BASED_OUTPATIENT_CLINIC_OR_DEPARTMENT_OTHER)
Admission: EM | Admit: 2013-12-12 | Discharge: 2013-12-12 | Disposition: A | Discharge status: Home / Self Care | Payer: Medicare HMO | Attending: Emergency Medicine | Admitting: Emergency Medicine

## 2013-12-12 ENCOUNTER — Encounter (HOSPITAL_BASED_OUTPATIENT_CLINIC_OR_DEPARTMENT_OTHER): Payer: Self-pay | Admitting: Emergency Medicine

## 2013-12-12 ENCOUNTER — Emergency Department (HOSPITAL_BASED_OUTPATIENT_CLINIC_OR_DEPARTMENT_OTHER): Payer: Medicare HMO

## 2013-12-12 ENCOUNTER — Other Ambulatory Visit: Payer: Self-pay | Admitting: Obstetrics

## 2013-12-12 DIAGNOSIS — Z8701 Personal history of pneumonia (recurrent): Secondary | ICD-10-CM | POA: Insufficient documentation

## 2013-12-12 DIAGNOSIS — J45909 Unspecified asthma, uncomplicated: Secondary | ICD-10-CM | POA: Insufficient documentation

## 2013-12-12 DIAGNOSIS — Z8669 Personal history of other diseases of the nervous system and sense organs: Secondary | ICD-10-CM | POA: Insufficient documentation

## 2013-12-12 DIAGNOSIS — G8929 Other chronic pain: Secondary | ICD-10-CM | POA: Insufficient documentation

## 2013-12-12 DIAGNOSIS — Z981 Arthrodesis status: Secondary | ICD-10-CM | POA: Diagnosis not present

## 2013-12-12 DIAGNOSIS — I509 Heart failure, unspecified: Secondary | ICD-10-CM | POA: Insufficient documentation

## 2013-12-12 DIAGNOSIS — R200 Anesthesia of skin: Secondary | ICD-10-CM | POA: Insufficient documentation

## 2013-12-12 DIAGNOSIS — Z8541 Personal history of malignant neoplasm of cervix uteri: Secondary | ICD-10-CM | POA: Insufficient documentation

## 2013-12-12 DIAGNOSIS — M519 Unspecified thoracic, thoracolumbar and lumbosacral intervertebral disc disorder: Secondary | ICD-10-CM | POA: Insufficient documentation

## 2013-12-12 DIAGNOSIS — Z8719 Personal history of other diseases of the digestive system: Secondary | ICD-10-CM | POA: Insufficient documentation

## 2013-12-12 DIAGNOSIS — Z8659 Personal history of other mental and behavioral disorders: Secondary | ICD-10-CM | POA: Diagnosis not present

## 2013-12-12 DIAGNOSIS — M549 Dorsalgia, unspecified: Secondary | ICD-10-CM

## 2013-12-12 DIAGNOSIS — Z87891 Personal history of nicotine dependence: Secondary | ICD-10-CM | POA: Diagnosis not present

## 2013-12-12 DIAGNOSIS — Z79899 Other long term (current) drug therapy: Secondary | ICD-10-CM | POA: Insufficient documentation

## 2013-12-12 DIAGNOSIS — L7682 Other postprocedural complications of skin and subcutaneous tissue: Secondary | ICD-10-CM

## 2013-12-12 DIAGNOSIS — M545 Low back pain: Secondary | ICD-10-CM | POA: Insufficient documentation

## 2013-12-12 MED ORDER — OXYCODONE-ACETAMINOPHEN 5-325 MG PO TABS: 1.0000 | ORAL_TABLET | Freq: Four times a day (QID) | ORAL | Status: DC | PRN

## 2013-12-12 MED ORDER — OXYCODONE HCL 10 MG PO TABS: 10.0000 mg | ORAL_TABLET | Freq: Four times a day (QID) | ORAL | Status: DC | PRN

## 2013-12-12 MED ORDER — OXYCODONE-ACETAMINOPHEN 5-325 MG PO TABS
2.0000 | ORAL_TABLET | Freq: Once | ORAL | Status: AC
  Administered 2013-12-12: 2 via ORAL
  Filled 2013-12-12: qty 2

## 2013-12-12 NOTE — Telephone Encounter (Signed)
Forward back to provider to chart call to patient.

## 2013-12-12 NOTE — ED Provider Notes (Signed)
CSN: [id number]     Arrival date & time 12/12/13  1105 History   First MD Initiated Contact with Patient 12/12/13 1209     Chief Complaint  Patient presents with  . Back Pain     (Consider location/radiation/quality/duration/timing/severity/associated sxs/prior Treatment) HPI Comments: Patient is a 33 year old female past medical history significant for chronic back pain, neuromuscular disorder presenting to the emergency department for 2 days of acute on chronic low back pain. Patient denies any new injuries or falls. She states she has had worsening bilateral low back pain with her normal radiation down her left leg. Denies any changes in her numbness or tingling to her left leg. States she has run out of her pain medication from her OB/GYN. Patient states she is due to see a neurosurgeon at [facility] on November 3 and have a CT scan for further evaluation of previous rods placed in her back. She states that "the rods are broken." Denies any fevers or chills or night sweats.    Past Medical History  Diagnosis Date  . Family history of anesthesia complication     Nausea  . Respiratory failure 11/2010; 09/2011    Resolved  - History pneumonia  . Chronic back pain   . Asthma     "very badly" (01/17/2012)  . Chronic bronchitis     "@ least once/yr" (01/17/2012) - none since 01/2012  . Exertional dyspnea     Asthma  . GERD (gastroesophageal reflux disease)   . Degenerative disk disease   . Cervical cancer 1999; 2000  . Complication of anesthesia     back surgeries x 6 - fusions T8-S2- rods  . Difficult intubation     "needs small tube"  . PONV (postoperative nausea and vomiting)   . CHF (congestive heart failure) 2006    Resolved -History"with 2006 pregnancy, toxcemia "  not seeing a cardiologist now.  . Pneumonia     "~ once/yr" (01/17/2012) none since 01/2012  . Anxiety     no meds  . Migraines     last migraine 09/2013  . Neuromuscular disorder     sciatica  -  affecting left leg numb/pain   Past Surgical History  Procedure Laterality Date  . Spinal fusion  10/04/2011    Procedure: FUSION POSTERIOR SPINAL MULTILEVEL;  Surgeon: Lowella Grip, MD;  Location: [facility];  Service: Orthopedics;  Laterality: Bilateral;  repair lumbar pseudoarthritis, revision segmental fixation, L3-L4, L4-L5  . Cervical cone biopsy  1999; 2000    cervical cancer  . Adenoidectomy, tonsillectomy and myringotomy with tube placement  1983?  Marland Kitchen Tonsillectomy    . Knee arthroscopy  2004 X 2    "left X 2" (01/17/2012)  . Lumbar disc surgery  06/1998    "L4-5" (01/17/2012)  . Lumbar fusion  08/1998; 2010; 2012; 06/2011    "fusion cage; put rods in; reanchored screws; rod broke" (01/17/2012)  . Anterior lumbar fusion  01/17/2012    Procedure: ANTERIOR LUMBAR FUSION 2 LEVELS;  Surgeon: Lowella Grip, MD;  Location: [facility];  Service: Orthopedics;  Laterality: Bilateral;  Anterior Lumbar 3-4, Lumbar 4-5 fusion with bone graft harvest  . Anterior lumbar fusion    . Cesarean section N/A 11/08/2013    Procedure: REPEAT CESAREAN SECTION;  Surgeon: Shelly Bombard, MD;  Location: [facility];  Service: Obstetrics;  Laterality: N/A;   Family History  Problem Relation Age of Onset  . Heart disease Father   . Hypertension Father   .  Diabetes Father   . Deep vein thrombosis Father   . Cancer     History  Substance Use Topics  . Smoking status: Former Smoker -- 0.50 packs/day for 15 years    Types: Cigarettes    Quit date: 04/14/2013  . Smokeless tobacco: Never Used  . Alcohol Use: No   OB History   Grav Para Term Preterm Abortions TAB SAB Ect Mult Living   3 2 2  1  1   2      Review of Systems  Musculoskeletal: Positive for back pain.  Neurological: Positive for numbness (chronic).  All other systems reviewed and are negative.     Allergies  Other and Ultram  Home Medications   Prior to Admission medications   Medication Sig Start Date End Date Taking? Authorizing  Provider  albuterol (PROVENTIL HFA;VENTOLIN HFA) 108 (90 BASE) MCG/ACT inhaler Inhale 2 puffs into the lungs every 6 (six) hours as needed for wheezing or shortness of breath.    Historical Provider, MD  HYDROcodone-acetaminophen (NORCO) 10-325 MG per tablet Take 1 tablet by mouth every 6 (six) hours as needed. 11/15/13   Lisa A Leftwich-Kirby, CNM  loratadine (CLARITIN) 10 MG tablet Take 1 tablet (10 mg total) by mouth daily. 06/17/13   Shelly Bombard, MD  norethindrone (ORTHO MICRONOR) 0.35 MG tablet Take 1 tablet (0.35 mg total) by mouth daily. 11/10/13   Lahoma Crocker, MD  Oxycodone HCl 10 MG TABS Take 1 tablet (10 mg total) by mouth every 6 (six) hours as needed. 11/29/13   Shelly Bombard, MD  oxyCODONE-acetaminophen (PERCOCET) 5-325 MG per tablet Take 1-2 tablets by mouth every 6 (six) hours as needed for severe pain. 12/12/13   Lorry Anastasi L Ron Junco, PA-C  Prenatal-Fe Fum-Methf-FA w/o A (VITAFOL-NANO) 18-0.6-0.4 MG TABS Take 18 mg by mouth daily. 04/30/13   Shelly Bombard, MD   BP 127/64  Pulse 80  Temp(Src) 98.2 F (36.8 C) (Oral)  Resp 16  Ht 5' 2.5" (1.588 m)  Wt 196 lb (88.905 kg)  BMI 35.26 kg/m2  SpO2 100% Physical Exam  Nursing note and vitals reviewed. Constitutional: She is oriented to person, place, and time. She appears well-developed and well-nourished. No distress.  HENT:  Head: Normocephalic and atraumatic.  Right Ear: External ear normal.  Left Ear: External ear normal.  Nose: Nose normal.  Mouth/Throat: Oropharynx is clear and moist. No oropharyngeal exudate.  Eyes: Conjunctivae and EOM are normal. Pupils are equal, round, and reactive to light.  Neck: Normal range of motion. Neck supple.  Cardiovascular: Normal rate, regular rhythm, normal heart sounds and intact distal pulses.   Pulmonary/Chest: Effort normal and breath sounds normal. No respiratory distress.  Abdominal: Soft. There is no tenderness.  Musculoskeletal:       Lumbar back: She exhibits  decreased range of motion and tenderness. She exhibits no swelling, no edema and no deformity.  Well healed old surgical scar noted to back.   Neurological: She is alert and oriented to person, place, and time. She has normal strength. No cranial nerve deficit. Gait normal. GCS eye subscore is 4. GCS verbal subscore is 5. GCS motor subscore is 6.  Sensation grossly intact.  No pronator drift.  Bilateral heel-knee-shin intact.  Skin: Skin is warm and dry. She is not diaphoretic.    ED Course  Procedures (including critical care time) Medications  oxyCODONE-acetaminophen (PERCOCET/ROXICET) 5-325 MG per tablet 2 tablet (2 tablets Oral Given 12/12/13 1302)    Labs Review Labs  Reviewed - No data to display  Imaging Review Dg Lumbar Spine Complete  12/12/2013   CLINICAL DATA:  Severe low back pain after childbirth last month. History of multiple back surgeries.  EXAM: LUMBAR SPINE - COMPLETE 4+ VIEW  COMPARISON:  Radiographs 03/19/2013.  FINDINGS: There is a stable convex right scoliosis. The lateral alignment is normal. Patient is status post posterolateral fusion from T9 through S1. The main distraction rods are chronically fractured bilaterally at the L2-3 level. The left sided shorter rod is also now fractured at the same level. The right-sided rod appears intact. Anterior fusion at L3-4 and L4-5 and Ray cages at L5-S1 are stable. There is no evidence of acute osseous fracture.  IMPRESSION: Apparent interval fracture of the shorter bridging distraction rod on the left at L2-3. The main distraction rods are chronically fractured bilaterally at the same level. The overall alignment is stable.   Electronically Signed   By: Camie Patience M.D.   On: 12/12/2013 13:22     EKG Interpretation None      MDM   Final diagnoses:  Chronic back pain    Filed Vitals:   12/12/13 1109  BP: 127/64  Pulse: 80  Temp: 98.2 F (36.8 C)  Resp: 16   Afebrile, NAD, non-toxic appearing, AAOx4.  No new  injury. No neurological deficits and normal neuro exam.  Patient can walk but states is painful.  No loss of bowel or bladder control.  No concern for cauda equina.  No fever, night sweats, weight loss, IVDU.  RICE protocol and pain medicine alternative reviewed with the patient. A review of the [place] Controlled Substance Abuse Database shows recent Oxycodone prescription. X-ray shows stable rod fractures, patient is aware of this from previous visits and exams.  I discussed, again, follow up with the specialists referred to during previous visits and that therapeutic exercise might be beneficial. Patient is stable at time of discharge       Harlow Mares, PA-C 12/12/13 1530

## 2013-12-12 NOTE — Discharge Instructions (Signed)
Please follow up with your primary care physician in 1-2 days. If you do not have one please call the [facility] number listed above. Please follow up with your surgeon at your scheduled appointments. Please take pain medication and/or muscle relaxants as prescribed and as needed for pain. Please do not drive on narcotic pain medication or on muscle relaxants. Please read all discharge instructions and return precautions.   Back Pain, Adult Low back pain is very common. About 1 in 5 people have back pain.The cause of low back pain is rarely dangerous. The pain often gets better over time.About half of people with a sudden onset of back pain feel better in just 2 weeks. About 8 in 10 people feel better by 6 weeks.  CAUSES Some common causes of back pain include:  Strain of the muscles or ligaments supporting the spine.  Wear and tear (degeneration) of the spinal discs.  Arthritis.  Direct injury to the back. DIAGNOSIS Most of the time, the direct cause of low back pain is not known.However, back pain can be treated effectively even when the exact cause of the pain is unknown.Answering your caregiver's questions about your overall health and symptoms is one of the most accurate ways to make sure the cause of your pain is not dangerous. If your caregiver needs more information, he or she may order lab work or imaging tests (X-rays or MRIs).However, even if imaging tests show changes in your back, this usually does not require surgery. HOME CARE INSTRUCTIONS For many people, back pain returns.Since low back pain is rarely dangerous, it is often a condition that people can learn to [facility] their own.   Remain active. It is stressful on the back to sit or stand in one place. Do not sit, drive, or stand in one place for more than 30 minutes at a time. Take short walks on level surfaces as soon as pain allows.Try to increase the length of time you walk each day.  Do not  stay in bed.Resting more than 1 or 2 days can delay your recovery.  Do not avoid exercise or work.Your body is made to move.It is not dangerous to be active, even though your back may hurt.Your back will likely heal faster if you return to being active before your pain is gone.  Pay attention to your body when you bend and lift. Many people have less discomfortwhen lifting if they bend their knees, keep the load close to their bodies,and avoid twisting. Often, the most comfortable positions are those that put less stress on your recovering back.  Find a comfortable position to sleep. Use a firm mattress and lie on your side with your knees slightly bent. If you lie on your back, put a pillow under your knees.  Only take over-the-counter or prescription medicines as directed by your caregiver. Over-the-counter medicines to reduce pain and inflammation are often the most helpful.Your caregiver may prescribe muscle relaxant drugs.These medicines help dull your pain so you can more quickly return to your normal activities and healthy exercise.  Put ice on the injured area.  Put ice in a plastic bag.  Place a towel between your skin and the bag.  Leave the ice on for 15-20 minutes, 03-04 times a day for the first 2 to 3 days. After that, ice and heat may be alternated to reduce pain and spasms.  Ask your caregiver about trying back exercises and gentle massage. This may be of some benefit.  Avoid feeling anxious or stressed.Stress increases muscle tension and can worsen back pain.It is important to recognize when you are anxious or stressed and learn ways to manage it.Exercise is a great option. SEEK MEDICAL CARE IF:  You have pain that is not relieved with rest or medicine.  You have pain that does not improve in 1 week.  You have new symptoms.  You are generally not feeling well. SEEK IMMEDIATE MEDICAL CARE IF:   You have pain that radiates from your back into your  legs.  You develop new bowel or bladder control problems.  You have unusual weakness or numbness in your arms or legs.  You develop nausea or vomiting.  You develop abdominal pain.  You feel faint. Document Released: 01/31/2005 Document Revised: 08/02/2011 Document Reviewed: 06/04/2013 [facility] Patient Information 2015 [place], [place]. This information is not intended to replace advice given to you by your health care provider. Make sure you discuss any questions you have with your health care provider.

## 2013-12-12 NOTE — ED Notes (Signed)
Pa  at bedside. 

## 2013-12-12 NOTE — Telephone Encounter (Signed)
OK for refill.

## 2013-12-12 NOTE — ED Notes (Signed)
Chronic lower back pain but pain has been worse over the past 2 days.  Has hardware in back and scheduled with spine specialist 12/25/13.

## 2013-12-12 NOTE — Telephone Encounter (Signed)
Will review 

## 2013-12-12 NOTE — ED Notes (Signed)
Anderson Malta, PA-C at bedside.

## 2013-12-13 NOTE — Telephone Encounter (Signed)
After reviewing patient pharmacy activity it was noted that patient received Rx for Percocet on 12-12-13.  She therefore does not need Rx from me.  Patient called and this was conveyed.

## 2013-12-13 NOTE — ED Provider Notes (Signed)
Medical screening examination/treatment/procedure(s) were performed by non-physician practitioner and as supervising physician I was immediately available for consultation/collaboration.     Veryl Speak, MD 12/13/13 [phone]

## 2013-12-16 ENCOUNTER — Encounter (HOSPITAL_BASED_OUTPATIENT_CLINIC_OR_DEPARTMENT_OTHER): Payer: Self-pay | Admitting: Emergency Medicine

## 2013-12-17 ENCOUNTER — Ambulatory Visit
Admit: 2013-12-17 | Discharge: 2013-12-17 | Disposition: A | Discharge status: Home / Self Care | Payer: Medicare HMO | Attending: Orthopedic Surgery | Admitting: Orthopedic Surgery

## 2013-12-17 ENCOUNTER — Ambulatory Visit (INDEPENDENT_AMBULATORY_CARE_PROVIDER_SITE_OTHER): Payer: Medicare HMO | Admitting: Obstetrics

## 2013-12-17 ENCOUNTER — Encounter: Payer: Self-pay | Admitting: Obstetrics

## 2013-12-17 DIAGNOSIS — M545 Low back pain, unspecified: Secondary | ICD-10-CM

## 2013-12-17 DIAGNOSIS — F329 Major depressive disorder, single episode, unspecified: Secondary | ICD-10-CM

## 2013-12-17 DIAGNOSIS — F53 Postpartum depression: Secondary | ICD-10-CM

## 2013-12-17 DIAGNOSIS — R208 Other disturbances of skin sensation: Secondary | ICD-10-CM

## 2013-12-17 DIAGNOSIS — O99345 Other mental disorders complicating the puerperium: Secondary | ICD-10-CM

## 2013-12-17 DIAGNOSIS — Z30011 Encounter for initial prescription of contraceptive pills: Secondary | ICD-10-CM

## 2013-12-17 DIAGNOSIS — L7682 Other postprocedural complications of skin and subcutaneous tissue: Secondary | ICD-10-CM

## 2013-12-17 MED ORDER — SERTRALINE HCL 25 MG PO TABS: 25.0000 mg | ORAL_TABLET | Freq: Every day | ORAL | Status: DC

## 2013-12-17 MED ORDER — OXYCODONE HCL 10 MG PO TABS: 10.0000 mg | ORAL_TABLET | Freq: Four times a day (QID) | ORAL | Status: DC | PRN

## 2013-12-17 MED ORDER — NORETHIN ACE-ETH ESTRAD-FE 1-20 MG-MCG(24) PO TABS: 1.0000 | ORAL_TABLET | Freq: Every day | ORAL | Status: DC

## 2013-12-17 NOTE — Progress Notes (Addendum)
Subjective:     [name] is a 33 y.o. female who presents for a postpartum visit. She is 6 weeks postpartum following a low cervical transverse Cesarean section. I have fully reviewed the prenatal and intrapartum course. The delivery was at 79 gestational weeks. Outcome: repeat cesarean section, low transverse incision. Anesthesia: spinal. Postpartum course has been complicated by pain and depression. Has H/O chronic low back pain that was worsened by pregnancy. Baby's course has been normal. Baby is feeding by bottle Dory Horn. Bleeding no bleeding. Bowel function is normal. Bladder function is normal. Patient is not sexually active. Contraception method is abstinence. Postpartum depression screening: negative.  Tobacco, alcohol and substance abuse history reviewed.  Adult immunizations reviewed including TDAP, rubella and varicella.  The following portions of the patient's history were reviewed and updated as appropriate: allergies, current medications, past family history, past medical history, past social history, past surgical history and problem list.  Review of Systems A comprehensive review of systems was negative except for: Musculoskeletal: positive for back pain Behavioral/Psych: positive for depression   Objective:    BP 111/75 mmHg  Pulse 81  Temp(Src) 98 F (36.7 C)  Ht 5' 2.5" (1.588 m)  Wt 205 lb (92.987 kg)  BMI 36.87 kg/m2  Breastfeeding? No  General:  alert and no distress   Breasts:  inspection negative, no nipple discharge or bleeding, no masses or nodularity palpable  Lungs: clear to auscultation bilaterally  Heart:  regular rate and rhythm, S1, S2 normal, no murmur, click, rub or gallop  Abdomen: normal findings: soft, non-tender and incision clean, dry and intact.   Vulva:  normal  Vagina: normal vagina  Cervix:  no cervical motion tenderness  Corpus: normal size, contour, position, consistency, mobility, non-tender  Adnexa:  no mass, fullness,  tenderness  Rectal Exam: Not performed.           Assessment:    1.Normal postpartum exam. Pap smear not done at today's visit.    2.Contraception  3.Back pain from scoliosis.  Harrington rods are broken and displaced.  Patient has appointment with Orthopedist in 2 weeks   4.Depression Plan:     1. Contraception: oral progesterone-only contraceptive 2. Micronor Rx 3. Oxycodone Rx.  Orthopedist appt. 12-25-13. 4. Zoloft Rx 5. Follow up in 6 weeks.  Healthy lifestyle practices reviewed

## 2013-12-23 ENCOUNTER — Ambulatory Visit: Payer: Medicare HMO | Admitting: Obstetrics

## 2013-12-24 ENCOUNTER — Telehealth: Payer: Self-pay | Admitting: *Deleted

## 2013-12-24 NOTE — Telephone Encounter (Signed)
Patient interested in the [place] IUD for contraception. Per Dr. Jodi Mourning schedule 6 weeks from 12-17-13. Patient states she is currently on birth control pills. Patient advised to continue birth control pills until after insertion. Scheduled appointment for 01-29-14 @ 4pm.

## 2013-12-27 ENCOUNTER — Other Ambulatory Visit: Payer: Self-pay | Admitting: Obstetrics

## 2013-12-27 ENCOUNTER — Telehealth: Payer: Self-pay | Admitting: *Deleted

## 2013-12-27 DIAGNOSIS — R0989 Other specified symptoms and signs involving the circulatory and respiratory systems: Secondary | ICD-10-CM

## 2013-12-27 DIAGNOSIS — F17200 Nicotine dependence, unspecified, uncomplicated: Secondary | ICD-10-CM

## 2013-12-27 MED ORDER — VARENICLINE TARTRATE 1 MG PO TABS: 1.0000 mg | ORAL_TABLET | Freq: Two times a day (BID) | ORAL | Status: DC

## 2013-12-27 MED ORDER — VARENICLINE TARTRATE 0.5 MG X 11 & 1 MG X 42 PO MISC: ORAL | Status: DC

## 2013-12-27 NOTE — Telephone Encounter (Signed)
Patient states she was seen at back surgeon and has planned surgery in January. Patient wants to quit smoking and has requested Chantix- per Dr Jodi Mourning- patient can continue her Zoloft although she states it is not helping her so she may discontinue. Referral was made to specialist for her foot. Will have Seth Bake find a time early in December for her IUD insertion- she wants to move it up.

## 2013-12-29 DIAGNOSIS — M438X9 Other specified deforming dorsopathies, site unspecified: Secondary | ICD-10-CM | POA: Insufficient documentation

## 2013-12-30 ENCOUNTER — Other Ambulatory Visit: Payer: Self-pay

## 2013-12-30 DIAGNOSIS — R209 Unspecified disturbances of skin sensation: Secondary | ICD-10-CM

## 2013-12-31 ENCOUNTER — Ambulatory Visit (INDEPENDENT_AMBULATORY_CARE_PROVIDER_SITE_OTHER): Payer: Medicare HMO | Admitting: Vascular Surgery

## 2013-12-31 ENCOUNTER — Ambulatory Visit (HOSPITAL_COMMUNITY)
Admission: RE | Admit: 2013-12-31 | Discharge: 2013-12-31 | Disposition: A | Discharge status: Home / Self Care | Payer: Medicaid Other | Source: Ambulatory Visit | Attending: Vascular Surgery | Admitting: Vascular Surgery

## 2013-12-31 ENCOUNTER — Encounter: Payer: Self-pay | Admitting: Vascular Surgery

## 2013-12-31 VITALS — BP 122/82 | HR 72 | Resp 16 | Ht 62.5 in | Wt 203.0 lb

## 2013-12-31 DIAGNOSIS — Z720 Tobacco use: Secondary | ICD-10-CM | POA: Diagnosis not present

## 2013-12-31 DIAGNOSIS — R208 Other disturbances of skin sensation: Secondary | ICD-10-CM | POA: Insufficient documentation

## 2013-12-31 DIAGNOSIS — M79604 Pain in right leg: Secondary | ICD-10-CM

## 2013-12-31 DIAGNOSIS — R209 Unspecified disturbances of skin sensation: Secondary | ICD-10-CM

## 2013-12-31 NOTE — Progress Notes (Signed)
Subjective:     Patient ID: [name], female   DOB: 1980-11-04, 33 y.o.   MRN: [id number]  HPIthis 33 year old female was referred by Dr. Jodi Mourning for evaluation of circulation to the right leg. Patient has a cold sensation in the right leg below the knee which is been present for several months. She denies any history of gangrene, infection, cellulitis, or rest pain. She is unable to ambulate long distances because of dyspnea on exertion. She does have a remote history of congestive heart failure but is not currently being treated. She has no history of arrhythmias. She has had 6 lumbar spine operations and needs a seventh procedure to be done at [facility] in January.  Past Medical History  Diagnosis Date  . Family history of anesthesia complication     Nausea  . Respiratory failure 11/2010; 09/2011    Resolved  - History pneumonia  . Chronic back pain   . Asthma     "very badly" (01/17/2012)  . Chronic bronchitis     "@ least once/yr" (01/17/2012) - none since 01/2012  . Exertional dyspnea     Asthma  . GERD (gastroesophageal reflux disease)   . Degenerative disk disease   . Cervical cancer 1999; 2000  . Complication of anesthesia     back surgeries x 6 - fusions T8-S2- rods  . Difficult intubation     "needs small tube"  . PONV (postoperative nausea and vomiting)   . CHF (congestive heart failure) 2006    Resolved -History"with 2006 pregnancy, toxcemia "  not seeing a cardiologist now.  . Pneumonia     "~ once/yr" (01/17/2012) none since 01/2012  . Anxiety     no meds  . Migraines     last migraine 09/2013  . Neuromuscular disorder     sciatica  - affecting left leg numb/pain    History  Substance Use Topics  . Smoking status: Former Smoker -- 0.50 packs/day for 15 years    Types: Cigarettes    Quit date: 04/14/2013  . Smokeless tobacco: Never Used  . Alcohol Use: No    Family History  Problem Relation Age of Onset  . Heart disease Father   .  Hypertension Father   . Diabetes Father   . Deep vein thrombosis Father   . Cancer      Allergies  Allergen Reactions  . Other     Compound Tincture of Benzoin   . Ultram [Tramadol] Itching    Current outpatient prescriptions: albuterol (PROVENTIL HFA;VENTOLIN HFA) 108 (90 BASE) MCG/ACT inhaler, Inhale 2 puffs into the lungs every 6 (six) hours as needed for wheezing or shortness of breath., Disp: , Rfl: ;  loratadine (CLARITIN) 10 MG tablet, Take 1 tablet (10 mg total) by mouth daily., Disp: 30 tablet, Rfl: 11 Norethindrone Acetate-Ethinyl Estrad-FE (LOESTRIN 24 FE) 1-20 MG-MCG(24) tablet, Take 1 tablet by mouth daily., Disp: 1 Package, Rfl: 11;  Oxycodone HCl 10 MG TABS, Take 1 tablet (10 mg total) by mouth every 6 (six) hours as needed., Disp: 40 tablet, Rfl: 0;  sertraline (ZOLOFT) 25 MG tablet, Take 1 tablet (25 mg total) by mouth daily., Disp: 30 tablet, Rfl: 11 varenicline (CHANTIX CONTINUING MONTH PAK) 1 MG tablet, Take 1 tablet (1 mg total) by mouth 2 (two) times daily., Disp: 60 tablet, Rfl: 1;  varenicline (CHANTIX STARTING MONTH PAK) 0.5 MG X 11 & 1 MG X 42 tablet, Take one 0.5 mg tablet by mouth once daily for 3  days, then increase to one 0.5 mg tablet twice daily for 4 days, then increase to one 1 mg tablet twice daily., Disp: 53 tablet, Rfl: 0 HYDROcodone-acetaminophen (NORCO) 10-325 MG per tablet, Take 1 tablet by mouth every 6 (six) hours as needed., Disp: 20 tablet, Rfl: 0;  oxyCODONE-acetaminophen (PERCOCET) 5-325 MG per tablet, Take 1-2 tablets by mouth every 6 (six) hours as needed for severe pain., Disp: 20 tablet, Rfl: 0  BP 122/82 mmHg  Pulse 72  Resp 16  Ht 5' 2.5" (1.588 m)  Wt 203 lb (92.08 kg)  BMI 36.51 kg/m2  Body mass index is 36.51 kg/(m^2).           Review of Systems Denies chest pain but does have dyspnea on exertion, leg pain with walking, prominent vein the left thigh, asthma, weakness and numbness in both arms and legs. Patient had recent  pregnancy with delivery in September 2015. Other systems negative and a complete review of systems. Biggest problem is chronic back discomfort.     Objective:   Physical Exam BP 122/82 mmHg  Pulse 72  Resp 16  Ht 5' 2.5" (1.588 m)  Wt 203 lb (92.08 kg)  BMI 36.51 kg/m2  Gen.-alert and oriented x3 in no apparent distress HEENT normal for age Lungs no rhonchi or wheezing Cardiovascular regular rhythm no murmurs carotid pulses 3+ palpable no bruits audible Abdomen soft nontender no palpable masses Musculoskeletal free of  major deformities Skin clear -no rashes Neurologic normal Lower extremities 3+ femoral and dorsalis pedis pulses palpable bilaterally with no edema  Today I ordered lower extremity ABIs which are reviewed and interpreted. She has triphasic normal flow in both legs with normal ABIs greater than 1.0.      Assessment:     Cold sensation right leg-etiology unknown-possibly neurogenic in origin with history of multiple back operations No evidence of arterial insufficiency with totally normal study     Plan:     No further vascular workup indicated Return to see [place] on when necessary basis

## 2014-01-15 ENCOUNTER — Other Ambulatory Visit: Payer: Self-pay | Admitting: Obstetrics

## 2014-01-22 ENCOUNTER — Encounter (HOSPITAL_COMMUNITY): Payer: Self-pay | Admitting: Obstetrics

## 2014-01-23 NOTE — Telephone Encounter (Signed)
Patient rescheduled for 01-24-14 @ 9 am

## 2014-01-24 ENCOUNTER — Ambulatory Visit: Payer: Self-pay | Admitting: Obstetrics

## 2014-01-29 ENCOUNTER — Ambulatory Visit: Payer: Self-pay | Admitting: Obstetrics

## 2014-02-03 ENCOUNTER — Ambulatory Visit: Payer: Self-pay | Admitting: Obstetrics

## 2014-02-04 ENCOUNTER — Telehealth: Payer: Self-pay | Admitting: *Deleted

## 2014-02-04 NOTE — Telephone Encounter (Signed)
Patient called requesting to reschedule IUD Insertion. Attempted to contact patient and unable to leave a message because the mailbox is full.

## 2014-02-10 ENCOUNTER — Encounter: Payer: Self-pay | Admitting: *Deleted

## 2014-02-10 DIAGNOSIS — J45909 Unspecified asthma, uncomplicated: Secondary | ICD-10-CM | POA: Insufficient documentation

## 2014-02-10 DIAGNOSIS — G43909 Migraine, unspecified, not intractable, without status migrainosus: Secondary | ICD-10-CM | POA: Insufficient documentation

## 2014-02-10 DIAGNOSIS — Z720 Tobacco use: Secondary | ICD-10-CM | POA: Insufficient documentation

## 2014-02-10 DIAGNOSIS — I509 Heart failure, unspecified: Secondary | ICD-10-CM | POA: Insufficient documentation

## 2014-02-11 ENCOUNTER — Encounter: Payer: Self-pay | Admitting: Obstetrics & Gynecology

## 2014-02-12 NOTE — Telephone Encounter (Signed)
Patient scheduled for insertion 02-13-14 @ 3:15

## 2014-02-13 ENCOUNTER — Encounter: Payer: Self-pay | Admitting: Obstetrics

## 2014-02-13 ENCOUNTER — Ambulatory Visit (INDEPENDENT_AMBULATORY_CARE_PROVIDER_SITE_OTHER): Payer: Medicare HMO | Admitting: Obstetrics

## 2014-02-13 VITALS — BP 118/73 | HR 73 | Temp 98.3°F | Ht 62.0 in | Wt 204.0 lb

## 2014-02-13 DIAGNOSIS — Z30430 Encounter for insertion of intrauterine contraceptive device: Secondary | ICD-10-CM | POA: Diagnosis not present

## 2014-02-13 DIAGNOSIS — Z01812 Encounter for preprocedural laboratory examination: Secondary | ICD-10-CM

## 2014-02-13 DIAGNOSIS — Z3202 Encounter for pregnancy test, result negative: Secondary | ICD-10-CM | POA: Diagnosis not present

## 2014-02-13 DIAGNOSIS — E669 Obesity, unspecified: Secondary | ICD-10-CM

## 2014-02-13 LAB — POCT URINE PREGNANCY: PREG TEST UR: NEGATIVE

## 2014-02-14 ENCOUNTER — Encounter: Payer: Self-pay | Admitting: Obstetrics

## 2014-02-14 NOTE — Progress Notes (Signed)
IUD Insertion Procedure Note  Pre-operative Diagnosis: Desire contraception  Post-operative Diagnosis: same  Indications: contraception  Procedure Details  Urine pregnancy test was done in office and result was negative.  The risks (including infection, bleeding, pain, and uterine perforation) and benefits of the procedure were explained to the patient and Written informed consent was obtained.    Cervix cleansed with Betadine. Uterus sounded to 6 cm. IUD inserted without difficulty. String visible and trimmed. Patient tolerated procedure well.  IUD Information: Mirena, Lot # [id number], Expiration date 06 / 18.  Condition: Stable  Complications: None  Plan:  The patient was advised to call for any fever or for prolonged or severe pain or bleeding. She was advised to use Percocet as needed for mild to moderate pain.   Attending Physician Documentation: I was present for or participated in the entire procedure, including opening and closing.

## 2014-02-18 LAB — SURESWAB, VAGINOSIS/VAGINITIS PLUS
ATOPOBIUM VAGINAE: 6.9 Log (cells/mL)
BV CATEGORY: UNDETERMINED — AB
C. PARAPSILOSIS, DNA: NOT DETECTED
C. albicans, DNA: NOT DETECTED
C. glabrata, DNA: NOT DETECTED
C. trachomatis RNA, TMA: NOT DETECTED
C. tropicalis, DNA: NOT DETECTED
GARDNERELLA VAGINALIS: 8 Log (cells/mL)
LACTOBACILLUS SPECIES: DETECTED Log (cells/mL)
MEGASPHAERA SPECIES: 7.6 Log (cells/mL)
N. gonorrhoeae RNA, TMA: NOT DETECTED
T. VAGINALIS RNA, QL TMA: NOT DETECTED

## 2014-02-19 HISTORY — PX: BACK SURGERY: SHX140

## 2014-02-27 ENCOUNTER — Telehealth: Payer: Self-pay | Admitting: *Deleted

## 2014-02-27 ENCOUNTER — Other Ambulatory Visit: Payer: Self-pay | Admitting: *Deleted

## 2014-02-27 DIAGNOSIS — N939 Abnormal uterine and vaginal bleeding, unspecified: Secondary | ICD-10-CM

## 2014-02-27 MED ORDER — MEDROXYPROGESTERONE ACETATE 10 MG PO TABS: 10.0000 mg | ORAL_TABLET | Freq: Every day | ORAL | Status: DC

## 2014-02-27 NOTE — Telephone Encounter (Signed)
Patient states she had surgery on her back on 1/6 and she has been bleeding heavy for 5 days. Patient has a Mirena IUD and she was amenorrheic previous to her surgery. Patient states she has no abdominal pain and she is nervous about her hemoglobin dropping. Patient states she is not on any blood thinners at this time- she is taking a muscle relaxer and pain medication. Per Dr Jodi Mourning- will try to stop bleeding with Provera. Rx sent to pharmacy.

## 2014-03-17 ENCOUNTER — Telehealth: Payer: Self-pay | Admitting: *Deleted

## 2014-03-17 NOTE — Telephone Encounter (Signed)
Patient called and she is stating she has done fairly well after her back surgery, but she is suffering from major depression. Patient states she has even had thoughts of harming herself. [facility] council[facility] is in the office today and they have agreed to speak with her.

## 2014-03-27 ENCOUNTER — Ambulatory Visit: Payer: Medicare HMO | Admitting: Obstetrics

## 2014-03-27 ENCOUNTER — Ambulatory Visit (INDEPENDENT_AMBULATORY_CARE_PROVIDER_SITE_OTHER): Payer: Medicare HMO | Admitting: Obstetrics

## 2014-03-27 ENCOUNTER — Encounter: Payer: Self-pay | Admitting: Obstetrics

## 2014-03-27 VITALS — BP 111/65 | HR 87 | Temp 87.0°F | Ht 64.0 in | Wt 189.0 lb

## 2014-03-27 DIAGNOSIS — Z0000 Encounter for general adult medical examination without abnormal findings: Secondary | ICD-10-CM

## 2014-03-27 DIAGNOSIS — Z975 Presence of (intrauterine) contraceptive device: Secondary | ICD-10-CM

## 2014-03-27 DIAGNOSIS — N898 Other specified noninflammatory disorders of vagina: Secondary | ICD-10-CM

## 2014-03-27 NOTE — Progress Notes (Signed)
Subjective:    [name] is a 34 y.o. female who presents for contraception counseling. The patient has some complaints of anxiety today. The patient is sexually active. Pertinent past medical history: Chronic DJD of spine  The information documented in the HPI was reviewed and verified.  Menstrual History: OB History    Gravida Para Term Preterm AB TAB SAB Ectopic Multiple Living   3 2 2  1  1   2        No LMP recorded. Patient is not currently having periods (Reason: IUD).   Patient Active Problem List   Diagnosis Date Noted  . Midline low back pain without sciatica 12/17/2013  . Depression affecting pregnancy, postpartum 12/17/2013  . Pain at surgical incision 11/13/2013  . S/P cesarean section 11/08/2013  . Spasm of back muscles 11/06/2013  . Supervision of high-risk pregnancy of elderly multigravida 04/10/2013  . Scoliosis 03/27/2013  . Folliculitis 87/86/7672  . GERD without esophagitis 06/12/2012  . Degeneration of intervertebral disc, site unspecified 12/28/2011  . Asthma exacerbation 10/04/2011  . Back pain 10/04/2011  . S/P lumbar spinal fusion 10/04/2011   Past Medical History  Diagnosis Date  . Family history of anesthesia complication     Nausea  . Respiratory failure 11/2010; 09/2011    Resolved  - History pneumonia  . Chronic back pain   . Asthma     "very badly" (01/17/2012)  . Chronic bronchitis     "@ least once/yr" (01/17/2012) - none since 01/2012  . Exertional dyspnea     Asthma  . GERD (gastroesophageal reflux disease)   . Degenerative disk disease   . Cervical cancer 1999; 2000  . Complication of anesthesia     back surgeries x 6 - fusions T8-S2- rods  . Difficult intubation     "needs small tube"  . PONV (postoperative nausea and vomiting)   . CHF (congestive heart failure) 2006    Resolved -History"with 2006 pregnancy, toxcemia "  not seeing a cardiologist now.  . Pneumonia     "~ once/yr" (01/17/2012) none since 01/2012  .  Anxiety     no meds  . Migraines     last migraine 09/2013  . Neuromuscular disorder     sciatica  - affecting left leg numb/pain    Past Surgical History  Procedure Laterality Date  . Spinal fusion  10/04/2011    Procedure: FUSION POSTERIOR SPINAL MULTILEVEL;  Surgeon: Lowella Grip, MD;  Location: [facility];  Service: Orthopedics;  Laterality: Bilateral;  repair lumbar pseudoarthritis, revision segmental fixation, L3-L4, L4-L5  . Cervical cone biopsy  1999; 2000    cervical cancer  . Adenoidectomy, tonsillectomy and myringotomy with tube placement  1983?  Marland Kitchen Tonsillectomy    . Knee arthroscopy  2004 X 2    "left X 2" (01/17/2012)  . Lumbar disc surgery  06/1998    "L4-5" (01/17/2012)  . Lumbar fusion  08/1998; 2010; 2012; 06/2011    "fusion cage; put rods in; reanchored screws; rod broke" (01/17/2012)  . Anterior lumbar fusion  01/17/2012    Procedure: ANTERIOR LUMBAR FUSION 2 LEVELS;  Surgeon: Lowella Grip, MD;  Location: [facility];  Service: Orthopedics;  Laterality: Bilateral;  Anterior Lumbar 3-4, Lumbar 4-5 fusion with bone graft harvest  . Anterior lumbar fusion    . Cesarean section N/A 11/08/2013    Procedure: REPEAT CESAREAN SECTION;  Surgeon: Shelly Bombard, MD;  Location: [facility];  Service: Obstetrics;  Laterality: N/A;  .  Back surgery  02/19/2014     Current outpatient prescriptions:  .  albuterol (PROVENTIL HFA;VENTOLIN HFA) 108 (90 BASE) MCG/ACT inhaler, Inhale 2 puffs into the lungs every 6 (six) hours as needed for wheezing or shortness of breath., Disp: , Rfl:  .  loratadine (CLARITIN) 10 MG tablet, Take 1 tablet (10 mg total) by mouth daily., Disp: 30 tablet, Rfl: 11 .  Oxycodone HCl 10 MG TABS, Take 1 tablet (10 mg total) by mouth every 6 (six) hours as needed., Disp: 40 tablet, Rfl: 0 .  HYDROcodone-acetaminophen (NORCO) 10-325 MG per tablet, Take 1 tablet by mouth every 6 (six) hours as needed. (Patient not taking: Reported on 03/27/2014), Disp: 20 tablet, Rfl: 0 .   medroxyPROGESTERone (PROVERA) 10 MG tablet, Take 1 tablet (10 mg total) by mouth daily. (Patient not taking: Reported on 03/27/2014), Disp: 10 tablet, Rfl: 0 .  Norethindrone Acetate-Ethinyl Estrad-FE (LOESTRIN 24 FE) 1-20 MG-MCG(24) tablet, Take 1 tablet by mouth daily. (Patient not taking: Reported on 03/27/2014), Disp: 1 Package, Rfl: 11 .  oxyCODONE-acetaminophen (PERCOCET) 5-325 MG per tablet, Take 1-2 tablets by mouth every 6 (six) hours as needed for severe pain. (Patient not taking: Reported on 03/27/2014), Disp: 20 tablet, Rfl: 0 .  sertraline (ZOLOFT) 25 MG tablet, Take 1 tablet (25 mg total) by mouth daily. (Patient not taking: Reported on 03/27/2014), Disp: 30 tablet, Rfl: 11 .  varenicline (CHANTIX CONTINUING MONTH PAK) 1 MG tablet, Take 1 tablet (1 mg total) by mouth 2 (two) times daily. (Patient not taking: Reported on 03/27/2014), Disp: 60 tablet, Rfl: 1 .  varenicline (CHANTIX STARTING MONTH PAK) 0.5 MG X 11 & 1 MG X 42 tablet, Take one 0.5 mg tablet by mouth once daily for 3 days, then increase to one 0.5 mg tablet twice daily for 4 days, then increase to one 1 mg tablet twice daily. (Patient not taking: Reported on 03/27/2014), Disp: 53 tablet, Rfl: 0 Allergies  Allergen Reactions  . Other     Compound Tincture of Benzoin   . Ultram [Tramadol] Itching    History  Substance Use Topics  . Smoking status: Former Smoker -- 0.50 packs/day for 15 years    Types: Cigarettes    Quit date: 04/14/2013  . Smokeless tobacco: Never Used  . Alcohol Use: No    Family History  Problem Relation Age of Onset  . Heart disease Father   . Hypertension Father   . Diabetes Father   . Deep vein thrombosis Father   . Cancer         Review of Systems Constitutional: negative for weight loss Genitourinary:negative for abnormal menstrual periods and vaginal discharge   Objective:   BP 111/65 mmHg  Pulse 87  Temp(Src) 87 F (30.6 C)  Ht 5\' 4"  (1.626 m)  Wt 189 lb (85.73 kg)  BMI 32.43  kg/m2   General:   alert  Skin:   no rash or abnormalities  Lungs:   clear to auscultation bilaterally  Heart:   regular rate and rhythm, S1, S2 normal, no murmur, click, rub or gallop  Breasts:   normal without suspicious masses, skin or nipple changes or axillary nodes  Abdomen:  normal findings: no organomegaly, soft, non-tender and no hernia  Pelvis:  External genitalia: normal general appearance Urinary system: urethral meatus normal and bladder without fullness, nontender Vaginal: normal without tenderness, induration or masses Cervix: normal appearance.  IUD string visible. Adnexa: normal bimanual exam Uterus: anteverted and non-tender, normal size  Lab Review Urine pregnancy test Labs reviewed yes Radiologic studies reviewed yes    Assessment:    34 y.o., continuing IUD, no contraindications.   Plan:   Referred to PMD for Routine Health Maintenance.  All questions answered. Follow up as needed.  No orders of the defined types were placed in this encounter.   Orders Placed This Encounter  Procedures  . SureSwab, Vaginosis/Vaginitis Plus  . Ambulatory referral to Internal Medicine    Referral Priority:  Routine    Referral Type:  Consultation    Referral Reason:  Specialty Services Required    Requested Specialty:  Internal Medicine    Number of Visits Requested:  1

## 2014-03-28 ENCOUNTER — Telehealth: Payer: Self-pay

## 2014-03-28 NOTE — Telephone Encounter (Signed)
patient has appt with PCP on 04/16/14 at 11;15am - it is with [facility] in [place] - that was patient's preference - let her know

## 2014-04-01 LAB — SURESWAB, VAGINOSIS/VAGINITIS PLUS
Atopobium vaginae: 7.6 Log (cells/mL)
BV CATEGORY: UNDETERMINED — AB
C. TRACHOMATIS RNA, TMA: NOT DETECTED
C. TROPICALIS, DNA: NOT DETECTED
C. albicans, DNA: NOT DETECTED
C. glabrata, DNA: NOT DETECTED
C. parapsilosis, DNA: NOT DETECTED
LACTOBACILLUS SPECIES: DETECTED Log (cells/mL)
MEGASPHAERA SPECIES: 7.7 Log (cells/mL)
N. GONORRHOEAE RNA, TMA: NOT DETECTED
T. vaginalis RNA, QL TMA: NOT DETECTED

## 2014-04-02 ENCOUNTER — Other Ambulatory Visit: Payer: Self-pay | Admitting: Obstetrics

## 2014-04-02 DIAGNOSIS — N760 Acute vaginitis: Principal | ICD-10-CM

## 2014-04-02 DIAGNOSIS — B9689 Other specified bacterial agents as the cause of diseases classified elsewhere: Secondary | ICD-10-CM

## 2014-04-02 MED ORDER — TINIDAZOLE 500 MG PO TABS: 1000.0000 mg | ORAL_TABLET | Freq: Every day | ORAL | Status: DC

## 2014-04-07 ENCOUNTER — Telehealth: Payer: Self-pay | Admitting: *Deleted

## 2014-04-07 DIAGNOSIS — J019 Acute sinusitis, unspecified: Secondary | ICD-10-CM

## 2014-04-07 MED ORDER — AZITHROMYCIN 250 MG PO TABS: ORAL_TABLET | ORAL | Status: DC

## 2014-04-07 NOTE — Telephone Encounter (Signed)
Patient states she has a bad cough and sinus infection and would like to know if Dr Jodi Mourning would call her in a Zpak. Per Dr Jodi MourningMadaline Brilliant to send Rx.

## 2014-04-14 ENCOUNTER — Telehealth: Payer: Self-pay | Admitting: *Deleted

## 2014-04-14 ENCOUNTER — Encounter: Payer: Self-pay | Admitting: *Deleted

## 2014-04-14 NOTE — Telephone Encounter (Signed)
Pre-Visit Call completed and chart updated.   Pre-Visit Info documented in Specialty Comments under SnapShot.    

## 2014-04-16 ENCOUNTER — Encounter: Payer: Self-pay | Admitting: Family

## 2014-04-16 ENCOUNTER — Ambulatory Visit (INDEPENDENT_AMBULATORY_CARE_PROVIDER_SITE_OTHER): Payer: Medicare HMO | Admitting: Family

## 2014-04-16 VITALS — BP 120/70 | HR 88 | Temp 97.8°F | Resp 16 | Ht 63.0 in | Wt 188.5 lb

## 2014-04-16 DIAGNOSIS — G8929 Other chronic pain: Secondary | ICD-10-CM

## 2014-04-16 DIAGNOSIS — F53 Postpartum depression: Secondary | ICD-10-CM

## 2014-04-16 DIAGNOSIS — G43809 Other migraine, not intractable, without status migrainosus: Secondary | ICD-10-CM

## 2014-04-16 DIAGNOSIS — G43909 Migraine, unspecified, not intractable, without status migrainosus: Secondary | ICD-10-CM | POA: Insufficient documentation

## 2014-04-16 DIAGNOSIS — J45909 Unspecified asthma, uncomplicated: Secondary | ICD-10-CM | POA: Insufficient documentation

## 2014-04-16 DIAGNOSIS — Z8541 Personal history of malignant neoplasm of cervix uteri: Secondary | ICD-10-CM | POA: Insufficient documentation

## 2014-04-16 DIAGNOSIS — F32A Depression, unspecified: Secondary | ICD-10-CM | POA: Insufficient documentation

## 2014-04-16 DIAGNOSIS — M545 Low back pain: Secondary | ICD-10-CM

## 2014-04-16 DIAGNOSIS — J4540 Moderate persistent asthma, uncomplicated: Secondary | ICD-10-CM

## 2014-04-16 DIAGNOSIS — O99345 Other mental disorders complicating the puerperium: Secondary | ICD-10-CM

## 2014-04-16 DIAGNOSIS — M25562 Pain in left knee: Secondary | ICD-10-CM

## 2014-04-16 DIAGNOSIS — M5440 Lumbago with sciatica, unspecified side: Secondary | ICD-10-CM

## 2014-04-16 DIAGNOSIS — K219 Gastro-esophageal reflux disease without esophagitis: Secondary | ICD-10-CM

## 2014-04-16 DIAGNOSIS — F329 Major depressive disorder, single episode, unspecified: Secondary | ICD-10-CM | POA: Insufficient documentation

## 2014-04-16 MED ORDER — VENLAFAXINE HCL ER 37.5 MG PO CP24: ORAL_CAPSULE | ORAL | Status: DC

## 2014-04-16 MED ORDER — RIZATRIPTAN BENZOATE 5 MG PO TABS: ORAL_TABLET | ORAL | Status: DC

## 2014-04-16 MED ORDER — BUDESONIDE-FORMOTEROL FUMARATE 80-4.5 MCG/ACT IN AERO: 2.0000 | INHALATION_SPRAY | Freq: Two times a day (BID) | RESPIRATORY_TRACT | Status: DC

## 2014-04-16 NOTE — Progress Notes (Signed)
Subjective:    Patient ID: [name], female    DOB: 1980-07-25, 34 y.o.   MRN: [id number]  HPI  [name] is a 34 yr old female who presents today to establish care.    1) Chronic back pain/Degenerative Disk disease- recent back surgery.  She reports that the neurosurgeon has been managing her back pain.  She has been on hydrocodone prior to the surgery and since her surgery she is on oxycodone. Requests referral to pain management.  2) GERD- on nexium, has been stable.  Reports recently improved her diet.    3) Anxiety/depression-  on valium from her neurosurgeon for back spasms helps some. Did not notice improvement on zoloft.  Felt "snappy" on the medication.  Felt like she is in a tunnel.  Reports panic attacks about 1-2 times a week. Feels ok in between.  She reports worsening depression since she delivered her baby 5 months ago.  Reports that she does have feelings of hopelessness and wishing she wasn't here but reports that she would "never have the guts" to hurt herself. Denies SI/HI today.  4) Migraines- reports that she has migraines 1-2 times a week.  Has associated photophobia, phonophobia.  Has tried imitrex in the past but that she has some nausea after use of imitrex. Reports that she has not used any other otc meds. Lasts for about 1 day.   5) Asthma- reports hx of asthma which is current controlled. Uses albuterol prn - uses 3-4 times a week.    6) Cervical cancer- had 2/3 of her cervix removed at age 9.  Last pap smear was 11/15 normal.  GYN is Dr. Baltazar Najjar  7) L knee pain- started 5 months ago.  Has worsened to the poin that when she bends down she develops severe left lateral knee pain. She has previous arthroscopic surgery in the left knee.    Review of Systems  Constitutional: Negative for unexpected weight change.  HENT: Negative for rhinorrhea.   Eyes: Negative for visual disturbance.  Respiratory:       Mild cough last night    Cardiovascular: Negative for leg swelling.  Gastrointestinal: Positive for constipation. Negative for nausea and diarrhea.  Genitourinary: Negative for dysuria, frequency and menstrual problem.  Musculoskeletal: Negative for myalgias.       Left knee pain  Skin: Negative for rash.  Neurological: Positive for headaches.  Hematological: Negative for adenopathy.  Psychiatric/Behavioral:       See hpi       Past Medical History  Diagnosis Date  . Family history of anesthesia complication     Nausea  . Respiratory failure 11/2010; 09/2011    Resolved  - History pneumonia  . Chronic back pain   . Asthma     "very badly" (01/17/2012)  . Chronic bronchitis     "@ least once/yr" (01/17/2012) - none since 01/2012  . Exertional dyspnea     Asthma  . GERD (gastroesophageal reflux disease)   . Degenerative disk disease   . Cervical cancer 1999; 2000  . Complication of anesthesia     back surgeries x 6 - fusions T8-S2- rods  . Difficult intubation     "needs small tube"  . PONV (postoperative nausea and vomiting)   . CHF (congestive heart failure) 2006    Resolved -History"with 2006 pregnancy, toxcemia "  not seeing a cardiologist now.  . Pneumonia     "~ once/yr" (01/17/2012) none since 01/2012  . Anxiety  no meds  . Migraines     last migraine 09/2013  . Neuromuscular disorder     sciatica  - affecting left leg numb/pain    History   Social History  . Marital Status: Married    Spouse Name: N/A  . Number of Children: 1  . Years of Education: N/A   Occupational History  . Disabled    Social History Main Topics  . Smoking status: Former Smoker -- 0.50 packs/day for 15 years    Types: Cigarettes    Quit date: 04/14/2013  . Smokeless tobacco: Never Used  . Alcohol Use: No  . Drug Use: No     Comment: last smoked today March 2015  . Sexual Activity:    Partners: Male    Birth Control/ Protection: IUD     Comment: Last Encounter 02-09-14   Other Topics Concern  .  Not on file   Social History Narrative   Married, 2 children   disabled    Past Surgical History  Procedure Laterality Date  . Spinal fusion  10/04/2011    Procedure: FUSION POSTERIOR SPINAL MULTILEVEL;  Surgeon: Lowella Grip, MD;  Location: [facility];  Service: Orthopedics;  Laterality: Bilateral;  repair lumbar pseudoarthritis, revision segmental fixation, L3-L4, L4-L5  . Cervical cone biopsy  1999; 2000    cervical cancer  . Adenoidectomy, tonsillectomy and myringotomy with tube placement  1983?  Marland Kitchen Tonsillectomy    . Knee arthroscopy  2004 X 2    "left X 2" (01/17/2012)  . Lumbar disc surgery  06/1998    "L4-5" (01/17/2012)  . Lumbar fusion  08/1998; 2010; 2012; 06/2011, 02/19/14    "fusion cage; put rods in; reanchored screws; rod broke" (01/17/2012); Had son in 10/2013 and rods in back broke.   Marland Kitchen Anterior lumbar fusion  01/17/2012    Procedure: ANTERIOR LUMBAR FUSION 2 LEVELS;  Surgeon: Lowella Grip, MD;  Location: [facility] [place];  Service: Orthopedics;  Laterality: Bilateral;  Anterior Lumbar 3-4, Lumbar 4-5 fusion with bone graft harvest  . Anterior lumbar fusion    . Cesarean section N/A 11/08/2013    Procedure: REPEAT CESAREAN SECTION;  Surgeon: Shelly Bombard, MD;  Location: [facility];  Service: Obstetrics;  Laterality: N/A;  . Back surgery  02/19/2014    Family History  Problem Relation Age of Onset  . Heart disease Father   . Hypertension Father   . Diabetes Father   . Deep vein thrombosis Father   . Cancer      Allergies  Allergen Reactions  . Other     Compound Tincture of Benzoin   . Ultram [Tramadol] Itching    Current Outpatient Prescriptions on File Prior to Visit  Medication Sig Dispense Refill  . albuterol (PROVENTIL HFA;VENTOLIN HFA) 108 (90 BASE) MCG/ACT inhaler Inhale 2 puffs into the lungs every 6 (six) hours as needed for wheezing or shortness of breath.    . baclofen (LIORESAL) 10 MG tablet     . diazepam (VALIUM) 10 MG tablet Take 10 mg by mouth.    .  esomeprazole (NEXIUM) 40 MG capsule Take 40 mg by mouth as needed.     . loratadine (CLARITIN) 10 MG tablet Take 1 tablet (10 mg total) by mouth daily. 30 tablet 11  . medroxyPROGESTERone (PROVERA) 10 MG tablet Take 1 tablet (10 mg total) by mouth daily. 10 tablet 0  . Norethindrone Acetate-Ethinyl Estrad-FE (LOESTRIN 24 FE) 1-20 MG-MCG(24) tablet Take 1 tablet by mouth daily. 1  Package 11  . oxyCODONE (ROXICODONE) 15 MG immediate release tablet Take 15 mg by mouth.     No current facility-administered medications on file prior to visit.    BP 120/70 mmHg  Pulse 88  Temp(Src) 97.8 F (36.6 C) (Oral)  Resp 16  Ht 5\' 3"  (1.6 m)  Wt 188 lb 8 oz (85.503 kg)  BMI 33.40 kg/m2  SpO2 99%    Objective:   Physical Exam  Constitutional: She is oriented to person, place, and time. She appears well-developed and well-nourished.  HENT:  Head: Normocephalic and atraumatic.  Right Ear: Tympanic membrane and ear canal normal.  Left Ear: Tympanic membrane and ear canal normal.  Mouth/Throat: No oropharyngeal exudate, posterior oropharyngeal edema or posterior oropharyngeal erythema.  Eyes: No scleral icterus.  Cardiovascular: Normal rate, regular rhythm and normal heart sounds.   No murmur heard. Pulmonary/Chest: Effort normal and breath sounds normal. No respiratory distress. She has no wheezes.  Musculoskeletal: She exhibits no edema.  Mild swelling left knee  Lymphadenopathy:    She has no cervical adenopathy.  Neurological: She is alert and oriented to person, place, and time.  Skin: Skin is warm and dry.  Psychiatric: She has a normal mood and affect. Her behavior is normal. Judgment and thought content normal.          Assessment & Plan:  Left knee pain- will refer to ortho for further evaluation.

## 2014-04-16 NOTE — Progress Notes (Signed)
Pre visit review using our clinic review tool, if applicable. No additional management support is needed unless otherwise documented below in the visit note. 

## 2014-04-16 NOTE — Assessment & Plan Note (Signed)
Chronic, refer to pain management.

## 2014-04-16 NOTE — Assessment & Plan Note (Signed)
Uncontrolled. Add maintenance med- symbicort.

## 2014-04-16 NOTE — Assessment & Plan Note (Signed)
Will initiate effexor for anxiety and depression. She agrees to call 911 if she develops active thoughts of hurting herself or others.

## 2014-04-16 NOTE — Patient Instructions (Signed)
You will be contacted about your referral to pain management. Start effexor xr 37.5mg  by mouth once daily for 3 days, increase to 2 tabs once daily on the fourth day. Go to ER if you develop thoughts of hurting yourself or others. You may try maxalt instead of imitrex as needed for migraine. Follow up in 1 month.

## 2014-04-16 NOTE — Assessment & Plan Note (Signed)
Will see if she can better tolerate maxalt rather than imitrex.  Could consider prophylactic med if symptoms do not improve.

## 2014-04-16 NOTE — Assessment & Plan Note (Signed)
Stable on ppi. Continue same.

## 2014-04-16 NOTE — Assessment & Plan Note (Signed)
paps up to date. Followed by gyn.

## 2014-05-01 ENCOUNTER — Telehealth: Payer: Self-pay | Admitting: Family

## 2014-05-01 NOTE — Telephone Encounter (Signed)
Pre Visit letter sent  °

## 2014-05-20 ENCOUNTER — Telehealth: Payer: Self-pay | Admitting: *Deleted

## 2014-05-20 NOTE — Telephone Encounter (Signed)
Pre-Visit Call completed with patient and chart updated.   Pre-Visit Info documented in Specialty Comments under SnapShot.    

## 2014-05-21 ENCOUNTER — Ambulatory Visit: Payer: Medicare HMO | Admitting: Family

## 2014-05-21 ENCOUNTER — Encounter: Payer: Self-pay | Admitting: Family

## 2014-05-21 VITALS — BP 130/68 | HR 63 | Temp 97.5°F | Resp 18 | Ht 63.0 in | Wt 189.0 lb

## 2014-05-21 DIAGNOSIS — Z0000 Encounter for general adult medical examination without abnormal findings: Secondary | ICD-10-CM

## 2014-05-21 NOTE — Progress Notes (Signed)
Pre visit review using our clinic review tool, if applicable. No additional management support is needed unless otherwise documented below in the visit note. 

## 2014-05-21 NOTE — Progress Notes (Signed)
   Subjective:    Patient ID: [name], female    DOB: March 21, 1980, 34 y.o.   MRN: [id number]  HPI      Review of Systems     Objective:   Physical Exam        Assessment & Plan:  Pt was unable to wait to be seen by provider due to pain and left without completing visit.

## 2014-05-22 ENCOUNTER — Other Ambulatory Visit: Payer: Self-pay | Admitting: Family

## 2014-06-30 ENCOUNTER — Ambulatory Visit: Payer: Medicare HMO | Admitting: Obstetrics

## 2014-07-15 ENCOUNTER — Ambulatory Visit: Payer: Medicare HMO | Admitting: Obstetrics

## 2014-07-18 ENCOUNTER — Telehealth: Payer: Self-pay | Admitting: Family

## 2014-07-18 ENCOUNTER — Other Ambulatory Visit: Payer: Self-pay | Admitting: Obstetrics

## 2014-07-18 NOTE — Telephone Encounter (Signed)
Pre Visit letter sent  °

## 2014-08-06 ENCOUNTER — Telehealth: Payer: Self-pay | Admitting: Behavioral Health

## 2014-08-06 ENCOUNTER — Encounter: Payer: Self-pay | Admitting: Behavioral Health

## 2014-08-06 NOTE — Telephone Encounter (Signed)
Pre-Visit Call completed with patient and chart updated.   Pre-Visit Info documented in Specialty Comments under SnapShot.    

## 2014-08-07 ENCOUNTER — Encounter: Payer: Medicare HMO | Admitting: Family

## 2014-08-08 ENCOUNTER — Telehealth: Payer: Self-pay | Admitting: Family

## 2014-08-08 ENCOUNTER — Encounter: Payer: Self-pay | Admitting: Family

## 2014-08-08 NOTE — Telephone Encounter (Signed)
Pt was no show 08/07/14 1:15pm, cpe appt, has not rescheduled, charge?

## 2014-08-08 NOTE — Telephone Encounter (Signed)
Yes pls. Tks

## 2014-08-22 ENCOUNTER — Encounter: Payer: Medicare HMO | Admitting: Medical

## 2014-08-22 DIAGNOSIS — Z0289 Encounter for other administrative examinations: Secondary | ICD-10-CM

## 2014-08-22 NOTE — Progress Notes (Signed)
This encounter was created in error - please disregard.

## 2014-08-26 ENCOUNTER — Encounter: Payer: Self-pay | Admitting: Medical

## 2014-08-26 ENCOUNTER — Telehealth: Payer: Self-pay | Admitting: Medical

## 2014-08-26 NOTE — Telephone Encounter (Signed)
Pt was no show 08/22/14 2:45pm, acute appt for back pain, pt has not rescheduled, mailing letter, charge no show fee?

## 2014-08-26 NOTE — Telephone Encounter (Signed)
charge 

## 2014-08-29 ENCOUNTER — Encounter: Payer: Self-pay | Admitting: Physical Medicine & Rehabilitation

## 2014-09-01 ENCOUNTER — Other Ambulatory Visit: Payer: Self-pay | Admitting: Obstetrics

## 2014-09-09 ENCOUNTER — Ambulatory Visit (INDEPENDENT_AMBULATORY_CARE_PROVIDER_SITE_OTHER): Payer: Medicare HMO | Admitting: Medical

## 2014-09-09 ENCOUNTER — Other Ambulatory Visit: Payer: Self-pay | Admitting: Cardiology

## 2014-09-09 ENCOUNTER — Other Ambulatory Visit: Payer: Self-pay | Admitting: Medical

## 2014-09-09 ENCOUNTER — Other Ambulatory Visit: Payer: Self-pay | Admitting: Family

## 2014-09-09 ENCOUNTER — Ambulatory Visit (HOSPITAL_BASED_OUTPATIENT_CLINIC_OR_DEPARTMENT_OTHER)
Admission: RE | Admit: 2014-09-09 | Discharge: 2014-09-09 | Disposition: A | Discharge status: Home / Self Care | Payer: Medicare HMO | Source: Ambulatory Visit | Attending: Medical | Admitting: Medical

## 2014-09-09 ENCOUNTER — Encounter: Payer: Self-pay | Admitting: Medical

## 2014-09-09 VITALS — BP 109/64 | HR 108 | Temp 98.3°F | Ht 63.0 in | Wt 182.2 lb

## 2014-09-09 DIAGNOSIS — M25562 Pain in left knee: Secondary | ICD-10-CM

## 2014-09-09 DIAGNOSIS — M25469 Effusion, unspecified knee: Secondary | ICD-10-CM | POA: Insufficient documentation

## 2014-09-09 DIAGNOSIS — M179 Osteoarthritis of knee, unspecified: Secondary | ICD-10-CM | POA: Insufficient documentation

## 2014-09-09 MED ORDER — DICLOFENAC SODIUM 75 MG PO TBEC: 75.0000 mg | DELAYED_RELEASE_TABLET | Freq: Two times a day (BID) | ORAL | Status: DC

## 2014-09-09 NOTE — Patient Instructions (Addendum)
Knee pain Get xray of left knee. Pt is on oxycodone. She has active script. Will give toradol 60 mg im.  Rx diclofenac to start tomorrow.   Based on results and how your are doing then will refer to orthopedist.  If pain changes local to directly in popliteal fossae or if calf swelling then notify us. Would get Doppler. If after hours then ED eval.  Follow up 10 days or as needed.

## 2014-09-09 NOTE — Progress Notes (Signed)
Subjective:    Patient ID: [name], female    DOB: Jul 15, 1980, 34 y.o.   MRN: [id number]  HPI  Pt has some left knee pain. Pt points to lateral aspect of the knee. Also points to tibial plateau. Pain for 4-5 days. Not trauma or fall. Knee feels swollen.   Pt comlplained of this during rehab for her back and they advised her to come here.  Hx of arthroscopic knee surgery lt knee in 2002.  Pt is on mirena.  Review of Systems  Constitutional: Negative for fever, chills and fatigue.  Respiratory: Negative for cough, chest tightness, shortness of breath and wheezing.   Cardiovascular: Negative for chest pain and palpitations.  Musculoskeletal:       Lt knee pain.. See HPI.     Past Medical History  Diagnosis Date  . Family history of anesthesia complication     Nausea  . Respiratory failure 11/2010; 09/2011    Resolved  - History pneumonia  . Chronic back pain   . Asthma     "very badly" (01/17/2012)  . Chronic bronchitis     "@ least once/yr" (01/17/2012) - none since 01/2012  . Exertional dyspnea     Asthma  . GERD (gastroesophageal reflux disease)   . Degenerative disk disease   . Cervical cancer 1999; 2000  . Complication of anesthesia     back surgeries x 6 - fusions T8-S2- rods  . Difficult intubation     "needs small tube"  . PONV (postoperative nausea and vomiting)   . CHF (congestive heart failure) 2006    Resolved -History"with 2006 pregnancy, toxcemia "  not seeing a cardiologist now.  . Pneumonia     "~ once/yr" (01/17/2012) none since 01/2012  . Anxiety     no meds  . Migraines     last migraine 09/2013  . Neuromuscular disorder     sciatica  - affecting left leg numb/pain    History   Social History  . Marital Status: Married    Spouse Name: N/A  . Number of Children: 1  . Years of Education: N/A   Occupational History  . Disabled    Social History Main Topics  . Smoking status: Former Smoker -- 0.50 packs/day for 15 years   Types: Cigarettes    Quit date: 04/14/2013  . Smokeless tobacco: Never Used  . Alcohol Use: No  . Drug Use: No     Comment: last smoked today March 2015  . Sexual Activity:    Partners: Male    Birth Control/ Protection: IUD     Comment: Last Encounter 02-09-14   Other Topics Concern  . Not on file   Social History Narrative   Married, 2 children   disabled    Past Surgical History  Procedure Laterality Date  . Spinal fusion  10/04/2011    Procedure: FUSION POSTERIOR SPINAL MULTILEVEL;  Surgeon: Lowella Grip, MD;  Location: [facility];  Service: Orthopedics;  Laterality: Bilateral;  repair lumbar pseudoarthritis, revision segmental fixation, L3-L4, L4-L5  . Cervical cone biopsy  1999; 2000    cervical cancer  . Adenoidectomy, tonsillectomy and myringotomy with tube placement  1983?  Marland Kitchen Tonsillectomy    . Knee arthroscopy  2004 X 2    "left X 2" (01/17/2012)  . Lumbar disc surgery  06/1998    "L4-5" (01/17/2012)  . Lumbar fusion  08/1998; 2010; 2012; 06/2011, 02/19/14    "fusion cage; put rods in; reanchored screws;  rod broke" (01/17/2012); Had son in 10/2013 and rods in back broke.   Marland Kitchen Anterior lumbar fusion  01/17/2012    Procedure: ANTERIOR LUMBAR FUSION 2 LEVELS;  Surgeon: Lowella Grip, MD;  Location: [facility];  Service: Orthopedics;  Laterality: Bilateral;  Anterior Lumbar 3-4, Lumbar 4-5 fusion with bone graft harvest  . Anterior lumbar fusion    . Cesarean section N/A 11/08/2013    Procedure: REPEAT CESAREAN SECTION;  Surgeon: Shelly Bombard, MD;  Location: [facility];  Service: Obstetrics;  Laterality: N/A;  . Back surgery  02/19/2014    Family History  Problem Relation Age of Onset  . Heart disease Father   . Hypertension Father   . Diabetes Father   . Deep vein thrombosis Father   . Cancer      Allergies  Allergen Reactions  . Ultram [Tramadol] Itching  . Other Rash    Compound Tincture of Benzoin     Current Outpatient Prescriptions on File Prior to Visit    Medication Sig Dispense Refill  . albuterol (PROVENTIL HFA;VENTOLIN HFA) 108 (90 BASE) MCG/ACT inhaler Inhale 2 puffs into the lungs every 6 (six) hours as needed for wheezing or shortness of breath.    . baclofen (LIORESAL) 10 MG tablet     . budesonide-formoterol (SYMBICORT) 80-4.5 MCG/ACT inhaler Inhale 2 puffs into the lungs 2 (two) times daily. 1 Inhaler 3  . diazepam (VALIUM) 10 MG tablet Take 10 mg by mouth.    . esomeprazole (NEXIUM) 40 MG capsule TAKE 1 CAPSULE BY MOUTH DAILY BEFORE BREAKFAST. 30 capsule 11  . loratadine (CLARITIN) 10 MG tablet TAKE 1 TABLET BY MOUTH DAILY. 30 tablet 11  . Oxycodone HCl 10 MG TABS Take 10-20 mg by mouth every 6 (six) hours as needed.    . rizatriptan (MAXALT) 5 MG tablet TAKE 1 TABLET BY MOUTH AT ONSET OF MIGRAINE, MAY REPEAT 2 HOURS LATER AS NEEDED. MAX 2 TABLETS PER DAY. 10 tablet 0  . medroxyPROGESTERone (PROVERA) 10 MG tablet Take 1 tablet (10 mg total) by mouth daily. (Patient not taking: Reported on 09/09/2014) 10 tablet 0  . Norethindrone Acetate-Ethinyl Estrad-FE (LOESTRIN 24 FE) 1-20 MG-MCG(24) tablet Take 1 tablet by mouth daily. (Patient not taking: Reported on 09/09/2014) 1 Package 11  . SUMAtriptan (IMITREX) 50 MG tablet Take 50 mg by mouth as needed.    . venlafaxine XR (EFFEXOR-XR) 37.5 MG 24 hr capsule Take 2 capsules daily. (Patient not taking: Reported on 09/09/2014) 60 capsule 2   No current facility-administered medications on file prior to visit.    BP 109/64 mmHg  Pulse 108  Temp(Src) 98.3 F (36.8 C) (Oral)  Ht 5\' 3"  (1.6 m)  Wt 182 lb 3.2 oz (82.645 kg)  BMI 32.28 kg/m2  SpO2 98%  Breastfeeding? No       Objective:   Physical Exam  General- No acute distress. Pleasant patient. Lungs- Clear, even and unlabored. Heart- regular rate and rhythm. Neurologic- CNII- XII grossly intact. Lt knee- lateral aspect of knee very tender to palpation. Lateral tibial plateau tenderness. Some pain on palpation where hamstring  inserts in to posterior knee region.  Lower ext- no pretibial edema. Calfs symmetric. Negative homans signs.      Assessment & Plan:

## 2014-09-09 NOTE — Progress Notes (Signed)
Pre visit review using our clinic review tool, if applicable. No additional management support is needed unless otherwise documented below in the visit note. 

## 2014-09-09 NOTE — Assessment & Plan Note (Addendum)
Get xray of left knee. Pt is on oxycodone. She has active script. Will give toradol 60 mg im.  Rx diclofenac to start tomorrow.

## 2014-09-10 ENCOUNTER — Telehealth: Payer: Self-pay | Admitting: Medical

## 2014-09-10 DIAGNOSIS — M25562 Pain in left knee: Secondary | ICD-10-CM | POA: Diagnosis not present

## 2014-09-10 MED ORDER — KETOROLAC TROMETHAMINE 60 MG/2ML IM SOLN
60.0000 mg | Freq: Once | INTRAMUSCULAR | Status: AC
  Administered 2014-09-10: 60 mg via INTRAMUSCULAR

## 2014-09-10 NOTE — Addendum Note (Signed)
Addended by: Bunnie Domino on: 09/10/2014 08:05 AM   Modules accepted: Orders

## 2014-09-15 ENCOUNTER — Telehealth: Payer: Self-pay | Admitting: Family

## 2014-09-15 NOTE — Telephone Encounter (Signed)
I would recommend that she continue to seek refills from her neurosurgeon until she sees pain management.

## 2014-09-15 NOTE — Telephone Encounter (Signed)
Caller name:Weatherly, Princess Relation to AJ:OINO Call back number:[phone] Pharmacy:  Reason for call: pt states she has an appt with pain management on 10/10/14, would like to know if [facility] will provide her pain medication until she see the doctor. Oxycodone 10 mg.

## 2014-09-15 NOTE — Telephone Encounter (Signed)
Left detailed message on cell and to call if any questions. 

## 2014-09-17 NOTE — Telephone Encounter (Signed)
Pt called states she never received my message below. Reviewed PCP recommendation below. Pt states she already contacted neurosurgeon and they told her to call her PCP. Advised her PCP will not be able to provide narcotic pain med. Pt wanted to know what she was supposed to do about potential withdrawal. States she ran out for 13 days previously and had very bad withdrawals. Advised pt to contact neurosurgery again and explain situation since they initiated medication and we have never prescribed it before. Pt did state that if she can't get Rx from neurosurgery she would try the ER.  Please advise?

## 2014-09-17 NOTE — Telephone Encounter (Signed)
Noted and agree.  No further recommendations at this time.

## 2014-09-19 ENCOUNTER — Ambulatory Visit: Payer: Medicare HMO | Admitting: Medical

## 2014-09-23 ENCOUNTER — Other Ambulatory Visit (HOSPITAL_BASED_OUTPATIENT_CLINIC_OR_DEPARTMENT_OTHER): Payer: Self-pay | Admitting: Specialist

## 2014-09-23 DIAGNOSIS — M25562 Pain in left knee: Secondary | ICD-10-CM

## 2014-09-27 ENCOUNTER — Ambulatory Visit (HOSPITAL_BASED_OUTPATIENT_CLINIC_OR_DEPARTMENT_OTHER)
Admission: RE | Admit: 2014-09-27 | Discharge: 2014-09-27 | Disposition: A | Discharge status: Home / Self Care | Payer: Medicare HMO | Source: Ambulatory Visit | Attending: Specialist | Admitting: Specialist

## 2014-09-27 DIAGNOSIS — M25462 Effusion, left knee: Secondary | ICD-10-CM | POA: Diagnosis not present

## 2014-09-27 DIAGNOSIS — M25562 Pain in left knee: Secondary | ICD-10-CM

## 2014-09-30 NOTE — Telephone Encounter (Signed)
Referred to ortho

## 2014-10-10 ENCOUNTER — Other Ambulatory Visit: Payer: Self-pay | Admitting: Physical Medicine & Rehabilitation

## 2014-10-10 ENCOUNTER — Encounter: Payer: Self-pay | Admitting: Medical

## 2014-10-10 ENCOUNTER — Ambulatory Visit (HOSPITAL_BASED_OUTPATIENT_CLINIC_OR_DEPARTMENT_OTHER): Payer: Medicare HMO | Admitting: Physical Medicine & Rehabilitation

## 2014-10-10 ENCOUNTER — Encounter: Payer: Self-pay | Admitting: Physical Medicine & Rehabilitation

## 2014-10-10 ENCOUNTER — Ambulatory Visit (INDEPENDENT_AMBULATORY_CARE_PROVIDER_SITE_OTHER): Payer: Medicare HMO | Admitting: Medical

## 2014-10-10 ENCOUNTER — Encounter: Payer: Medicare HMO | Attending: Physical Medicine & Rehabilitation

## 2014-10-10 VITALS — BP 104/67 | HR 89

## 2014-10-10 VITALS — BP 124/78 | HR 89 | Temp 98.8°F | Resp 16 | Ht 63.0 in | Wt 183.4 lb

## 2014-10-10 DIAGNOSIS — M2170 Unequal limb length (acquired), unspecified site: Secondary | ICD-10-CM

## 2014-10-10 DIAGNOSIS — M961 Postlaminectomy syndrome, not elsewhere classified: Secondary | ICD-10-CM | POA: Insufficient documentation

## 2014-10-10 DIAGNOSIS — Z79899 Other long term (current) drug therapy: Secondary | ICD-10-CM | POA: Diagnosis not present

## 2014-10-10 DIAGNOSIS — M549 Dorsalgia, unspecified: Secondary | ICD-10-CM | POA: Diagnosis not present

## 2014-10-10 DIAGNOSIS — Z79891 Long term (current) use of opiate analgesic: Secondary | ICD-10-CM | POA: Insufficient documentation

## 2014-10-10 DIAGNOSIS — Z981 Arthrodesis status: Secondary | ICD-10-CM | POA: Insufficient documentation

## 2014-10-10 DIAGNOSIS — G8929 Other chronic pain: Secondary | ICD-10-CM

## 2014-10-10 DIAGNOSIS — M25562 Pain in left knee: Secondary | ICD-10-CM | POA: Diagnosis not present

## 2014-10-10 DIAGNOSIS — M4120 Other idiopathic scoliosis, site unspecified: Secondary | ICD-10-CM

## 2014-10-10 DIAGNOSIS — Z87891 Personal history of nicotine dependence: Secondary | ICD-10-CM | POA: Diagnosis not present

## 2014-10-10 DIAGNOSIS — Z5181 Encounter for therapeutic drug level monitoring: Secondary | ICD-10-CM | POA: Diagnosis not present

## 2014-10-10 DIAGNOSIS — M419 Scoliosis, unspecified: Secondary | ICD-10-CM

## 2014-10-10 DIAGNOSIS — M545 Low back pain: Secondary | ICD-10-CM | POA: Diagnosis present

## 2014-10-10 DIAGNOSIS — M533 Sacrococcygeal disorders, not elsewhere classified: Secondary | ICD-10-CM

## 2014-10-10 MED ORDER — HYDROCODONE-ACETAMINOPHEN 5-325 MG PO TABS: 1.0000 | ORAL_TABLET | Freq: Four times a day (QID) | ORAL | Status: DC | PRN

## 2014-10-10 NOTE — Progress Notes (Signed)
Subjective:    Patient ID: [name], female    DOB: [birth year], 34 y.o.   MRN: [id number]  HPI   Pt in with some rt elbow pain x 5 days. Pain onset at physical . Pt states some speculation has tennis elbow. Also pt has some rt knee pain. On mri has some full thickness cartlidge defect. Pt states severe pain. Pt also states joint space narrowing.   Pt states surgery was offered to her. Pt went to initial visit of with pain management. Had screening and initial work up. They have drug screen pending per pt descriptoin. Pt states just ran out of her medications before specialist appointment. Pt spine MD wrote her for pain meds. She history of chronic back pain. Demonstrates large scar.  Pt was on oxycodone 10 mg every 6 hours in the past. Pt states in past hydrocodone seemed to help her just as well.  Aldena NP is patient provider. Pt mentions some access problems. She wants to know if possible I could see her(I explained to .  Pt uses  valium from  spine MD office.  LMP- On IUD Mirena. Started November.       Review of Systems  Constitutional: Negative for fever, chills and fatigue.  Respiratory: Negative for cough, chest tightness, shortness of breath and wheezing.   Cardiovascular: Negative for chest pain and palpitations.  Musculoskeletal:       Lt knee pain. Rt elbow pain.  Hematological: Negative for adenopathy. Does not bruise/bleed easily.  Psychiatric/Behavioral: Negative for behavioral problems, confusion and dysphoric mood.    Past Medical History  Diagnosis Date  . Family history of anesthesia complication     Nausea  . Respiratory failure 11/2010; 09/2011    Resolved  - History pneumonia  . Chronic back pain   . Asthma     "very badly" (01/17/2012)  . Chronic bronchitis     "@ least once/yr" (01/17/2012) - none since 01/2012  . Exertional dyspnea     Asthma  . GERD (gastroesophageal reflux disease)   . Degenerative disk disease   . Cervical  cancer 1999; 2000  . Complication of anesthesia     back surgeries x 6 - fusions T8-S2- rods  . Difficult intubation     "needs small tube"  . PONV (postoperative nausea and vomiting)   . CHF (congestive heart failure) 2006    Resolved -History"with 2006 pregnancy, toxcemia "  not seeing a cardiologist now.  . Pneumonia     "~ once/yr" (01/17/2012) none since 01/2012  . Anxiety     no meds  . Migraines     last migraine 09/2013  . Neuromuscular disorder     sciatica  - affecting left leg numb/pain    Social History   Social History  . Marital Status: Married    Spouse Name: N/A  . Number of Children: 1  . Years of Education: N/A   Occupational History  . Disabled    Social History Main Topics  . Smoking status: Former Smoker -- 0.50 packs/day for 15 years    Types: Cigarettes    Quit date: 04/14/2013  . Smokeless tobacco: Never Used  . Alcohol Use: No  . Drug Use: No     Comment: last smoked today March 2015  . Sexual Activity:    Partners: Male    Birth Control/ Protection: IUD     Comment: Last Encounter 02-09-14   Other Topics Concern  . Not on file  Social History Narrative   Married, 2 children   disabled    Past Surgical History  Procedure Laterality Date  . Spinal fusion  10/04/2011    Procedure: FUSION POSTERIOR SPINAL MULTILEVEL;  Surgeon: Lowella Grip, MD;  Location: [facility];  Service: Orthopedics;  Laterality: Bilateral;  repair lumbar pseudoarthritis, revision segmental fixation, L3-L4, L4-L5  . Cervical cone biopsy  1999; 2000    cervical cancer  . Adenoidectomy, tonsillectomy and myringotomy with tube placement  1983?  Marland Kitchen Tonsillectomy    . Knee arthroscopy  2004 X 2    "left X 2" (01/17/2012)  . Lumbar disc surgery  06/1998    "L4-5" (01/17/2012)  . Lumbar fusion  08/1998; 2010; 2012; 06/2011, 02/19/14    "fusion cage; put rods in; reanchored screws; rod broke" (01/17/2012); Had son in 10/2013 and rods in back broke.   Marland Kitchen Anterior lumbar fusion   01/17/2012    Procedure: ANTERIOR LUMBAR FUSION 2 LEVELS;  Surgeon: Lowella Grip, MD;  Location: [facility];  Service: Orthopedics;  Laterality: Bilateral;  Anterior Lumbar 3-4, Lumbar 4-5 fusion with bone graft harvest  . Anterior lumbar fusion    . Cesarean section N/A 11/08/2013    Procedure: REPEAT CESAREAN SECTION;  Surgeon: Shelly Bombard, MD;  Location: [facility];  Service: Obstetrics;  Laterality: N/A;  . Back surgery  02/19/2014    Family History  Problem Relation Age of Onset  . Heart disease Father   . Hypertension Father   . Diabetes Father   . Deep vein thrombosis Father   . Cancer      Allergies  Allergen Reactions  . Ultram [Tramadol] Itching  . Other Rash    Compound Tincture of Benzoin     Current Outpatient Prescriptions on File Prior to Visit  Medication Sig Dispense Refill  . albuterol (PROVENTIL HFA;VENTOLIN HFA) 108 (90 BASE) MCG/ACT inhaler Inhale 2 puffs into the lungs every 6 (six) hours as needed for wheezing or shortness of breath.    . baclofen (LIORESAL) 10 MG tablet     . budesonide-formoterol (SYMBICORT) 80-4.5 MCG/ACT inhaler Inhale 2 puffs into the lungs 2 (two) times daily. 1 Inhaler 3  . diazepam (VALIUM) 10 MG tablet Take 10 mg by mouth.    . diclofenac (VOLTAREN) 75 MG EC tablet Take 1 tablet (75 mg total) by mouth 2 (two) times daily. 30 tablet 0  . esomeprazole (NEXIUM) 40 MG capsule TAKE 1 CAPSULE BY MOUTH DAILY BEFORE BREAKFAST. 30 capsule 11  . loratadine (CLARITIN) 10 MG tablet TAKE 1 TABLET BY MOUTH DAILY. 30 tablet 11  . Oxycodone HCl 10 MG TABS Take 10-20 mg by mouth every 6 (six) hours as needed.    . rizatriptan (MAXALT) 5 MG tablet TAKE 1 TABLET BY MOUTH AT ONSET OF MIGRAINE, MAY REPEAT 2 HOURS LATER AS NEEDED. MAX 2 TABLETS PER DAY. 10 tablet 1   No current facility-administered medications on file prior to visit.    BP 124/78 mmHg  Pulse 89  Temp(Src) 98.8 F (37.1 C) (Oral)  Resp 16  Ht 5\' 3"  (1.6 m)  Wt 183 lb 6.4 oz  (83.19 kg)  BMI 32.50 kg/m2  SpO2 99%       Objective:   Physical Exam  General- No acute distress. Pleasant patient. Neck- Full range of motion, no jvd Lungs- Clear, even and unlabored. Heart- regular rate and rhythm. Neurologic- CNII- XII grossly intact.   Lt knee- on flexion and extension some crepitus. No  swelling.No instablity. Rt knee- lateral epicondyle pain on palpation. No swelling.      Assessment & Plan:  For your knee pain and back pain hx.  I am giving your hydrocodone 5/325. You told me you have 3 oxycodone left. Do not mix/use these while on hydrocodone. I am prescribing hydrocodone  since you mentioned in past they gave adequate relief.  If you run out of hydrocodone before you get follow up from pain management(they review your drug screen) then contact Ricquel NP(your pcp) for further refills.  Follow up with pain management or as needed.  Did not discuss dx or tx of elbow with pt today. If follows up with me will recommend tennis elbow brace and nsaid.
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
## 2014-10-10 NOTE — Progress Notes (Signed)
Pre visit review using our clinic review tool, if applicable. No additional management support is needed unless otherwise documented below in the visit note. 

## 2014-10-10 NOTE — Patient Instructions (Signed)
We will check a urine drug screen if it is negative we may assume the oxycodone prescription and will start slowly weaning down.  We'll make referral to [facility] to evaluate for shoe lift

## 2014-10-10 NOTE — Patient Instructions (Addendum)
For your knee pain and back pain hx.  I am giving your hydrocodone 5/325. You told me you have 3 oxycodone left. Do not mix/use these while on hydrocodone. I am prescribing hydrocodone  since you mentioned in past they gave adequate relief.  If you run out of hydrocodone before you get follow up from pain management(they review your drug screen) then contact Kandis NP(your pcp) for further refills.  Follow up with pain management or as needed.

## 2014-10-11 LAB — PMP ALCOHOL METABOLITE (ETG): Ethyl Glucuronide (EtG): NEGATIVE ng/mL

## 2014-10-14 ENCOUNTER — Telehealth: Payer: Self-pay | Admitting: Family

## 2014-10-14 NOTE — Telephone Encounter (Signed)
Left message for pt to return my call.

## 2014-10-14 NOTE — Telephone Encounter (Signed)
Caller name: Kinsly Hild Relationship to patient: Self  Can be reached: [phone]  Pharmacy:  Reason for call: pt is pending her results for her medication for pain management. Pt says that as of today she is out of her meds.   Pt requesting a call back at : [phone]

## 2014-10-14 NOTE — Telephone Encounter (Signed)
Spoke with pt. She states she had consultation with pain management and labs are pending so no Rxs have been given to pt by them yet. Labs may take   She saw Percell Miller on 10/10/14 and was given hydrocodone 5-325mg , #30. Pt states that only lasts her a few days and "it is like taking BC". Pt states she is going to be out of medication and she is concerned about withdrawal symptoms. Pt states she has not signed any pain contract with pain management yet. Per 09/15/14 phone note, PCP is unable to provide pt with narcotic pain medication.  Pt states she may have to go to the ER if we are unable to provide Rx for her. Is there anything you recommend re: withdrawal symptom management until pt can get results from pain management?

## 2014-10-15 NOTE — Telephone Encounter (Signed)
Spoke with pt and notified her that PCP will not be able to provide Rx for pain med and that it appears Dr Letta Pate has additional medication recommendations. Advised pt to contact Dr Letta Pate office to discuss below recommendations.

## 2014-10-15 NOTE — Telephone Encounter (Signed)
Even if the patient does not feel the hydrocodone is helpful this would help her prevent withdrawal. She could take one tablet 4 times a day. In addition if she wishes we can call in promethazine 25 mg twice a day #14 with no refills. Also clonidine 0.1 mg by mouth twice a day #14 no refills

## 2014-10-15 NOTE — Telephone Encounter (Signed)
Called patient to verify how many pills she has left. She has 4 Oxycodone pills remaining and 6 hydrocodone pills left. Patient reports that even taking 2 hydrocodone "is like taking nothing". She states her PCP told her if she needed more opiates she will need to go to the ED. I let her know Dr. Letta Pate will not prescribe anything until her UDS comes back clean and consistent. I also explained that the hydrocodone will help keep her from experiencing withdrawal symptoms.

## 2014-10-15 NOTE — Telephone Encounter (Signed)
Pt called for a f/u on her UDS results. I reviewed her chart and saw that the results had not come in yet. I also reviewed this telephone communication thread and thought it would be best to forward it to Dr. Letta Pate. I called the pt and left a message that the results were not in yet and to call back in a  couple of days.

## 2014-10-15 NOTE — Telephone Encounter (Signed)
I reviewed controlled Substance registry and see the she received 120 oxycodone on 8/4 from [place] NP in addition to the hydrocodone that she was given by Percell Miller.  I would recommend that she contact Candace Cruise for additional refills to cover her until she can get back in to see Dr. Aretta Nip for her 2 week follow up.

## 2014-10-17 ENCOUNTER — Encounter: Payer: Self-pay | Admitting: Medical

## 2014-10-17 ENCOUNTER — Encounter (HOSPITAL_BASED_OUTPATIENT_CLINIC_OR_DEPARTMENT_OTHER): Payer: Self-pay

## 2014-10-17 ENCOUNTER — Ambulatory Visit (INDEPENDENT_AMBULATORY_CARE_PROVIDER_SITE_OTHER): Payer: Medicare HMO | Admitting: Medical

## 2014-10-17 ENCOUNTER — Telehealth: Payer: Self-pay | Admitting: *Deleted

## 2014-10-17 ENCOUNTER — Emergency Department (HOSPITAL_BASED_OUTPATIENT_CLINIC_OR_DEPARTMENT_OTHER)
Admission: EM | Admit: 2014-10-17 | Discharge: 2014-10-17 | Disposition: A | Discharge status: Home / Self Care | Payer: Medicare HMO | Attending: Emergency Medicine | Admitting: Emergency Medicine

## 2014-10-17 ENCOUNTER — Telehealth: Payer: Self-pay | Admitting: Physical Medicine & Rehabilitation

## 2014-10-17 VITALS — BP 132/84 | HR 89 | Temp 99.3°F | Ht 63.0 in | Wt 181.4 lb

## 2014-10-17 DIAGNOSIS — G8929 Other chronic pain: Secondary | ICD-10-CM

## 2014-10-17 DIAGNOSIS — I509 Heart failure, unspecified: Secondary | ICD-10-CM | POA: Insufficient documentation

## 2014-10-17 DIAGNOSIS — Z720 Tobacco use: Secondary | ICD-10-CM | POA: Insufficient documentation

## 2014-10-17 DIAGNOSIS — M25562 Pain in left knee: Secondary | ICD-10-CM

## 2014-10-17 DIAGNOSIS — Z8701 Personal history of pneumonia (recurrent): Secondary | ICD-10-CM | POA: Insufficient documentation

## 2014-10-17 DIAGNOSIS — Z79899 Other long term (current) drug therapy: Secondary | ICD-10-CM | POA: Diagnosis not present

## 2014-10-17 DIAGNOSIS — J45909 Unspecified asthma, uncomplicated: Secondary | ICD-10-CM | POA: Diagnosis not present

## 2014-10-17 DIAGNOSIS — F419 Anxiety disorder, unspecified: Secondary | ICD-10-CM | POA: Diagnosis not present

## 2014-10-17 DIAGNOSIS — G43909 Migraine, unspecified, not intractable, without status migrainosus: Secondary | ICD-10-CM | POA: Insufficient documentation

## 2014-10-17 DIAGNOSIS — Z8541 Personal history of malignant neoplasm of cervix uteri: Secondary | ICD-10-CM | POA: Diagnosis not present

## 2014-10-17 DIAGNOSIS — K219 Gastro-esophageal reflux disease without esophagitis: Secondary | ICD-10-CM | POA: Insufficient documentation

## 2014-10-17 DIAGNOSIS — M5440 Lumbago with sciatica, unspecified side: Secondary | ICD-10-CM

## 2014-10-17 DIAGNOSIS — M545 Low back pain: Secondary | ICD-10-CM | POA: Insufficient documentation

## 2014-10-17 DIAGNOSIS — M549 Dorsalgia, unspecified: Secondary | ICD-10-CM

## 2014-10-17 LAB — BENZODIAZEPINES (GC/LC/MS), URINE
Alprazolam metabolite (GC/LC/MS), ur confirm: NEGATIVE ng/mL (ref <25)
CLONAZEPAU: NEGATIVE ng/mL (ref <25)
Flurazepam metabolite (GC/LC/MS), ur confirm: NEGATIVE ng/mL (ref <50)
LORAZEPAMU: NEGATIVE ng/mL (ref <50)
MIDAZOLAMU: NEGATIVE ng/mL (ref <50)
Nordiazepam (GC/LC/MS), ur confirm: 1187 ng/mL (ref <50)
Oxazepam (GC/LC/MS), ur confirm: 4164 ng/mL (ref <50)
TRIAZOLAMU: NEGATIVE ng/mL (ref <50)
Temazepam (GC/LC/MS), ur confirm: 3419 ng/mL (ref <50)

## 2014-10-17 LAB — OXYCODONE, URINE (LC/MS-MS)
OXYCODONE, UR: 15613 ng/mL (ref <50)
OXYMORPHONE, URINE: 14620 ng/mL (ref <50)

## 2014-10-17 LAB — PRESCRIPTION MONITORING PROFILE (SOLSTAS)
Amphetamine/Meth: NEGATIVE ng/mL
Barbiturate Screen, Urine: NEGATIVE ng/mL
Buprenorphine, Urine: NEGATIVE ng/mL
CARISOPRODOL, URINE: NEGATIVE ng/mL
Cocaine Metabolites: NEGATIVE ng/mL
Creatinine, Urine: 214.49 mg/dL (ref >20.0)
ECSTASY: NEGATIVE ng/mL
Fentanyl, Ur: NEGATIVE ng/mL
Meperidine, Ur: NEGATIVE ng/mL
Methadone Screen, Urine: NEGATIVE ng/mL
Nitrites, Initial: NEGATIVE ug/mL
PROPOXYPHENE: NEGATIVE ng/mL
Tapentadol, urine: NEGATIVE ng/mL
Tramadol Scrn, Ur: NEGATIVE ng/mL
Zolpidem, Urine: NEGATIVE ng/mL
pH, Initial: 5.4 pH (ref 4.5–8.9)

## 2014-10-17 LAB — OPIATES/OPIOIDS (LC/MS-MS)
Codeine Urine: NEGATIVE ng/mL (ref <50)
Hydrocodone: NEGATIVE ng/mL (ref <50)
Hydromorphone: NEGATIVE ng/mL (ref <50)
MORPHINE: NEGATIVE ng/mL (ref <50)
NORHYDROCODONE, UR: NEGATIVE ng/mL (ref <50)
OXYCODONE, UR: 15613 ng/mL (ref <50)
OXYMORPHONE, URINE: 14620 ng/mL (ref <50)

## 2014-10-17 LAB — CANNABANOIDS (GC/LC/MS), URINE: THC-COOH UR CONFIRM: 39 ng/mL — AB (ref <5)

## 2014-10-17 MED ORDER — KETOROLAC TROMETHAMINE 60 MG/2ML IM SOLN
60.0000 mg | Freq: Once | INTRAMUSCULAR | Status: AC
  Administered 2014-10-17: 60 mg via INTRAMUSCULAR

## 2014-10-17 NOTE — Discharge Instructions (Signed)
Chronic Back Pain  When back pain lasts longer than 3 months, it is called chronic back pain.People with chronic back pain often go through certain periods that are more intense (flare-ups).  CAUSES Chronic back pain can be caused by wear and tear (degeneration) on different structures in your back. These structures include:  The bones of your spine (vertebrae) and the joints surrounding your spinal cord and nerve roots (facets).  The strong, fibrous tissues that connect your vertebrae (ligaments). Degeneration of these structures may result in pressure on your nerves. This can lead to constant pain. HOME CARE INSTRUCTIONS  Avoid bending, heavy lifting, prolonged sitting, and activities which make the problem worse.  Take brief periods of rest throughout the day to reduce your pain. Lying down or standing usually is better than sitting while you are resting.  Take over-the-counter or prescription medicines only as directed by your caregiver. SEEK IMMEDIATE MEDICAL CARE IF:   You have weakness or numbness in one of your legs or feet.  You have trouble controlling your bladder or bowels.  You have nausea, vomiting, abdominal pain, shortness of breath, or fainting. Document Released: 03/10/2004 Document Revised: 04/25/2011 Document Reviewed: 01/15/2011 [facility] Patient Information 2015 [place], [place]. This information is not intended to replace advice given to you by your health care provider. Make sure you discuss any questions you have with your health care provider.   Follow up with pain management. Return if numbness/tingling in both legs occur, or if you have bowel or bladder incontinence, fevers, chills, vomiting, or diarrhea occur.

## 2014-10-17 NOTE — Patient Instructions (Addendum)
I am going to refer you to another pain management group. I will look for the clinic in [place] as well but seemed to have misplaced his information. If I find will contact you.  I would try to call neurosurgeon during interim. Maybe they will give limited supply of meds. Or use urgent care during the interim.  I have reviewed both pain management notes and talked with Fabiha NP and we will not be prescribing narcotics.  We did give you toradol 60 mg im.

## 2014-10-17 NOTE — ED Provider Notes (Signed)
CSN: [id number]     Arrival date & time 10/17/14  1523 History   First MD Initiated Contact with Patient 10/17/14 1540     Chief Complaint  Patient presents with  . Back Pain     (Consider location/radiation/quality/duration/timing/severity/associated sxs/prior Treatment) HPI Comments: [name] is a 34 y.o F with a pmhx of several spinal fusions and surgeries (last surgery in January 2016) who presents today c/o chronic back pain. Pt states that she has been taking 10mg  oxycodone every 4-6 hours since her surgery in January. She is currently being managed by pain management who she saw earlier today. She was told that she could not be given a new prescription for oxycodone due to having THC in her urine. Pt was given toradol in clinic and encouraged by pain management provider to come to ER or urgent care in the interim until she can come back to pain management. Pt states that she now feels like she is going through withdrawals bc she is having increased pain without her pain meds. Denies saddle anesthesia, bowel/bladder incontinence, weakness, CP , SOB, n/v/d, fevers, chills, confusion.  Patient is a 34 y.o. female presenting with back pain. The history is provided by the patient.  Back Pain   Past Medical History  Diagnosis Date  . Family history of anesthesia complication     Nausea  . Respiratory failure 11/2010; 09/2011    Resolved  - History pneumonia  . Chronic back pain   . Asthma     "very badly" (01/17/2012)  . Chronic bronchitis     "@ least once/yr" (01/17/2012) - none since 01/2012  . Exertional dyspnea     Asthma  . GERD (gastroesophageal reflux disease)   . Degenerative disk disease   . Cervical cancer 1999; 2000  . Complication of anesthesia     back surgeries x 6 - fusions T8-S2- rods  . Difficult intubation     "needs small tube"  . PONV (postoperative nausea and vomiting)   . CHF (congestive heart failure) 2006    Resolved -History"with 2006 pregnancy,  toxcemia "  not seeing a cardiologist now.  . Pneumonia     "~ once/yr" (01/17/2012) none since 01/2012  . Anxiety     no meds  . Migraines     last migraine 09/2013  . Neuromuscular disorder     sciatica  - affecting left leg numb/pain   Past Surgical History  Procedure Laterality Date  . Spinal fusion  10/04/2011    Procedure: FUSION POSTERIOR SPINAL MULTILEVEL;  Surgeon: Lowella Grip, MD;  Location: [place];  Service: Orthopedics;  Laterality: Bilateral;  repair lumbar pseudoarthritis, revision segmental fixation, L3-L4, L4-L5  . Cervical cone biopsy  1999; 2000    cervical cancer  . Adenoidectomy, tonsillectomy and myringotomy with tube placement  1983?  Marland Kitchen Tonsillectomy    . Knee arthroscopy  2004 X 2    "left X 2" (01/17/2012)  . Lumbar disc surgery  06/1998    "L4-5" (01/17/2012)  . Lumbar fusion  08/1998; 2010; 2012; 06/2011, 02/19/14    "fusion cage; put rods in; reanchored screws; rod broke" (01/17/2012); Had son in 10/2013 and rods in back broke.   Marland Kitchen Anterior lumbar fusion  01/17/2012    Procedure: ANTERIOR LUMBAR FUSION 2 LEVELS;  Surgeon: Lowella Grip, MD;  Location: [place];  Service: Orthopedics;  Laterality: Bilateral;  Anterior Lumbar 3-4, Lumbar 4-5 fusion with bone graft harvest  . Anterior lumbar fusion    .  Cesarean section N/A 11/08/2013    Procedure: REPEAT CESAREAN SECTION;  Surgeon: Shelly Bombard, MD;  Location: [facility];  Service: Obstetrics;  Laterality: N/A;  . Back surgery  02/19/2014   Family History  Problem Relation Age of Onset  . Heart disease Father   . Hypertension Father   . Diabetes Father   . Deep vein thrombosis Father   . Cancer     Social History  Substance Use Topics  . Smoking status: Current Every Day Smoker -- 0.50 packs/day for 15 years    Types: Cigarettes    Last Attempt to Quit: 04/14/2013  . Smokeless tobacco: Never Used  . Alcohol Use: No   OB History    Gravida Para Term Preterm AB TAB SAB Ectopic Multiple Living   3 2  2  1  1   2      Review of Systems  Constitutional: Negative for diaphoresis.  Respiratory: Negative for shortness of breath.   Musculoskeletal: Positive for back pain.  All other systems reviewed and are negative.     Allergies  Ultram and Other  Home Medications   Prior to Admission medications   Medication Sig Start Date End Date Taking? Authorizing Provider  albuterol (PROVENTIL HFA;VENTOLIN HFA) 108 (90 BASE) MCG/ACT inhaler Inhale 2 puffs into the lungs every 6 (six) hours as needed for wheezing or shortness of breath.    Historical Provider, MD  baclofen (LIORESAL) 10 MG tablet  02/24/14   Historical Provider, MD  budesonide-formoterol (SYMBICORT) 80-4.5 MCG/ACT inhaler Inhale 2 puffs into the lungs 2 (two) times daily. 04/16/14   Debbrah Alar, NP  diazepam (VALIUM) 10 MG tablet Take 10 mg by mouth. 03/27/14   Historical Provider, MD  diclofenac (VOLTAREN) 75 MG EC tablet Take 1 tablet (75 mg total) by mouth 2 (two) times daily. Patient not taking: Reported on 10/17/2014 09/09/14   Mackie Pai, PA-C  esomeprazole (NEXIUM) 40 MG capsule TAKE 1 CAPSULE BY MOUTH DAILY BEFORE BREAKFAST. 09/02/14   Shelly Bombard, MD  loratadine (CLARITIN) 10 MG tablet TAKE 1 TABLET BY MOUTH DAILY. 07/20/14   Shelly Bombard, MD  rizatriptan (MAXALT) 5 MG tablet TAKE 1 TABLET BY MOUTH AT ONSET OF MIGRAINE, MAY REPEAT 2 HOURS LATER AS NEEDED. MAX 2 TABLETS PER DAY. 09/09/14   Debbrah Alar, NP   BP 120/84 mmHg  Pulse 73  Temp(Src) 98.4 F (36.9 C) (Oral)  Resp 18  Ht 5\' 3"  (1.6 m)  Wt 181 lb (82.101 kg)  BMI 32.07 kg/m2  SpO2 100% Physical Exam  Constitutional: She is oriented to person, place, and time. She appears well-developed and well-nourished.  HENT:  Head: Normocephalic and atraumatic.  Eyes: Conjunctivae and EOM are normal. Pupils are equal, round, and reactive to light. Right eye exhibits no discharge. Left eye exhibits no discharge. No scleral icterus.  Cardiovascular:  Normal rate, regular rhythm, normal heart sounds and intact distal pulses.  Exam reveals no gallop and no friction rub.   No murmur heard. Pulmonary/Chest: Effort normal and breath sounds normal. No respiratory distress. She has no wheezes. She has no rales.  Abdominal: Soft. She exhibits no distension. There is no tenderness. There is no rebound.  Musculoskeletal: Normal range of motion. She exhibits tenderness. She exhibits no edema.  Approximately 7in scar down lower thoracic and lumbar spine from previous surgery. TTP of lumbar spine.   Lymphadenopathy:    She has no cervical adenopathy.  Neurological: She is alert and oriented to person,  place, and time. Coordination normal.  Strength 5/5 throughout. No sensory deficits.    Skin: Skin is warm and dry. No rash noted. No erythema. No pallor.  Psychiatric: She has a normal mood and affect. Her behavior is normal.  Nursing note and vitals reviewed.   ED Course  Procedures (including critical care time)   Labs Review Labs Reviewed - No data to display  Imaging Review No results found. I have personally reviewed and evaluated these images and lab results as part of my medical decision-making.   EKG Interpretation None      MDM   Final diagnoses:  Chronic back pain    Pt seen for chronic back pain after violating pain management contract with THC in her urine. Pt requesting pain meds for interim until she can get back in with pain management. No red flag symptoms of back pain. No neurological deficits. Pt not diaphoretic, vomiting, or having diarrhea. No concern for withdrawals. Recommend follow up with PCP and pain management as soon as possible.  Return precautions outlined in patient discharge instructions.      Dondra Spry Norman, PA-C 10/17/14 1641  Evelina Bucy, MD 10/17/14 [phone]

## 2014-10-17 NOTE — Progress Notes (Signed)
   Subjective:    Patient ID: [name], female    DOB: [birth year], 34 y.o.   MRN: [id number]  HPI  Pt in for evaluation. She failed drug screen by pain management. They will not treat her pain. She was positive for marijuana. So she is here to discuss this. She wanted to have repeat testing per review of pain management notes.(I did not recommend this on discussion)  She has knee pain that is chronic. I gave her hydrocodone prescription since she reported that did help in the past. She is now out of this  Below is pain management summary notes.  1. Thoracic and lumbar spine laminectomy syndrome with acquired scoliosis She's had multiple lumbar and thoracic surgeries. She has been on chronic narcotic analgesics but not at high doses.The current doses higher than she's been on in the past. Her overall goal is to reduce her doses and overall narcotic usage. Preoperatively , Prior to the most recent revision surgery January 2016she was on a lower dose of hydrocodone.  Her opioid risk is calculated as 8 however 5 out of the 8 points are due to her mother's history of alcohol abuse as well as prescription drug abuse. The patient did not grow up with her mother but instead was raised by her father. This may be a mitigating factor. Discussed with patient that we'll not usually prescribe opiates for patients with ORT scores greater than 7 She will need to get a urine drug screen to evaluate further.  2. Leg length discrepancy. This is secondary to acquired scoliosis with pelvic obliquity left iliac crest elevated versus the right. She would benefit from a heel lift versus buildup of sole. Referral to orthotist.  3. Probable left sacroiliac dysfunction., Continue physical therapy, consider sacroiliac injection under fluoroscopic guidance This marks end of pain management copied note  But she failed drug screen. Notes from his RN reviewed. Pt declines  other treatment options offered .  I  have talked to [facility] NP who is pt pcp and she is unwilling to prescribe the medication as well.  Pt was getting some hydrocodone from neurosurgeon. This could be source of pain medication for pt.  Pt states she can't understand how marijuana  was positive in her system.    Review of Systems  Musculoskeletal: Positive for back pain.       Lt knee pain.  Back pain is worse than her knee pain.     Objective:   Physical Exam  General- No acute distress. Pleasant patient.  Lungs- Clear, even and unlabored. Heart- regular rate and rhythm.  Lt knee- on flexion and extension some crepitus. No swelling.No instablity. Rt knee- lateral epicondyle pain on palpation. No swelling.      Assessment & Plan:  I am going to refer you to another pain management group. I will look for the clinic in [place] as well but seemed to have misplaced his information. If I find will contact you.  I would try to call neurosurgeon during interim. Maybe they will give limited supply of meds. Or use urgent care during the interim.  I have reviewed both pain management notes and talked with [name] and we will not be prescribing narcotics.  We did give you toradol 60 mg im.

## 2014-10-17 NOTE — Telephone Encounter (Signed)
I spoke with her and informed her that what she does with her PCP will not alter her results here and Dr Letta Pate decision.  We do not do repeat urine drug screens on request by patients.  She is welcome to be treated as non narcotic and discuss with him in the future, but for now the decision stands. Her Opioid risk score was 8 and per Dr Letta Pate note he usually does not prescribe with scores over 7, so this is a factor in the decision as well.

## 2014-10-17 NOTE — Telephone Encounter (Signed)
[name] has called multiple times about getting the results of I spoke with [name] and informed her that we can not prescribe for her based on UDS.  She is upset because she says she quite and has not smoked in two months. I told her the level does not matter but the fact that it is present is an indication that she has used the drug.  When asked if she could be tested again and I told her that it is not our policy to do so.  I could not say that in the future it might not be a possibility but this is not our practice. She was very upset but said ok thank you.

## 2014-10-17 NOTE — Progress Notes (Signed)
Pre visit review using our clinic review tool, if applicable. No additional management support is needed unless otherwise documented below in the visit note. 

## 2014-10-17 NOTE — ED Notes (Signed)
Pt states she has hx of chronic back pain-was not given narcotics at pain clinic due to [facility] positive urine-was seen by PCP today with referral to second pain management, injection of toradol and no narcotics-was advised to come to ED due to pt feels she is going through withdrawals from oxycodone-oxycodone 10mg  every 4-6 hours prn since January-last oxycodone was yesterday

## 2014-10-17 NOTE — Addendum Note (Signed)
Addended by: Bunnie Domino on: 10/17/2014 03:08 PM   Modules accepted: Orders

## 2014-10-17 NOTE — ED Notes (Signed)
Patient was discharged but didn't wait for discharge paperwork.

## 2014-10-22 ENCOUNTER — Telehealth: Payer: Self-pay | Admitting: Medical

## 2014-10-22 NOTE — Telephone Encounter (Signed)
Would you try other. You could also give pt this number [phone] to pt. [facility]. They do some pain management. I believe MD there is both neurologist and psychiatrist that does pain management. I think she could self refer. Other patient I have seen have self referred there.

## 2014-11-03 ENCOUNTER — Telehealth: Payer: Self-pay | Admitting: *Deleted

## 2014-11-03 NOTE — Telephone Encounter (Signed)
Pre-Visit Call completed recently, patient states it is up to date, would not like to review again.  Confirmed appointment.

## 2014-11-05 ENCOUNTER — Encounter: Payer: Self-pay | Admitting: Family

## 2014-11-05 ENCOUNTER — Other Ambulatory Visit: Payer: Self-pay | Admitting: Family

## 2014-11-05 ENCOUNTER — Ambulatory Visit (INDEPENDENT_AMBULATORY_CARE_PROVIDER_SITE_OTHER): Payer: Medicare HMO | Admitting: Family

## 2014-11-05 VITALS — BP 90/70 | HR 68 | Temp 98.0°F | Resp 16 | Ht 63.0 in | Wt 182.2 lb

## 2014-11-05 DIAGNOSIS — J209 Acute bronchitis, unspecified: Secondary | ICD-10-CM

## 2014-11-05 DIAGNOSIS — M8580 Other specified disorders of bone density and structure, unspecified site: Secondary | ICD-10-CM | POA: Diagnosis not present

## 2014-11-05 DIAGNOSIS — Z23 Encounter for immunization: Secondary | ICD-10-CM | POA: Diagnosis not present

## 2014-11-05 DIAGNOSIS — E785 Hyperlipidemia, unspecified: Secondary | ICD-10-CM | POA: Diagnosis not present

## 2014-11-05 DIAGNOSIS — M545 Low back pain, unspecified: Secondary | ICD-10-CM

## 2014-11-05 DIAGNOSIS — F329 Major depressive disorder, single episode, unspecified: Secondary | ICD-10-CM

## 2014-11-05 DIAGNOSIS — R5382 Chronic fatigue, unspecified: Secondary | ICD-10-CM | POA: Diagnosis not present

## 2014-11-05 DIAGNOSIS — R350 Frequency of micturition: Secondary | ICD-10-CM | POA: Diagnosis not present

## 2014-11-05 DIAGNOSIS — Z0000 Encounter for general adult medical examination without abnormal findings: Secondary | ICD-10-CM

## 2014-11-05 DIAGNOSIS — R739 Hyperglycemia, unspecified: Secondary | ICD-10-CM | POA: Diagnosis not present

## 2014-11-05 DIAGNOSIS — G8929 Other chronic pain: Secondary | ICD-10-CM

## 2014-11-05 DIAGNOSIS — F32A Depression, unspecified: Secondary | ICD-10-CM

## 2014-11-05 LAB — BASIC METABOLIC PANEL
BUN: 12 mg/dL (ref 6–23)
CALCIUM: 9.4 mg/dL (ref 8.4–10.5)
CO2: 31 mEq/L (ref 19–32)
Chloride: 105 mEq/L (ref 96–112)
Creatinine, Ser: 0.69 mg/dL (ref 0.40–1.20)
GFR: 101.92 mL/min (ref >60.00)
GLUCOSE: 96 mg/dL (ref 70–99)
POTASSIUM: 4.2 meq/L (ref 3.5–5.1)
SODIUM: 139 meq/L (ref 135–145)

## 2014-11-05 LAB — CBC WITH DIFFERENTIAL/PLATELET
BASOS PCT: 0.4 % (ref 0.0–3.0)
Basophils Absolute: 0 10*3/uL (ref 0.0–0.1)
EOS ABS: 0.3 10*3/uL (ref 0.0–0.7)
EOS PCT: 4.9 % (ref 0.0–5.0)
HEMATOCRIT: 40.8 % (ref 36.0–46.0)
HEMOGLOBIN: 13.6 g/dL (ref 12.0–15.0)
LYMPHS PCT: 17.6 % (ref 12.0–46.0)
Lymphs Abs: 1.2 10*3/uL (ref 0.7–4.0)
MCHC: 33.2 g/dL (ref 30.0–36.0)
MCV: 93.9 fl (ref 78.0–100.0)
Monocytes Absolute: 0.3 10*3/uL (ref 0.1–1.0)
Monocytes Relative: 5.1 % (ref 3.0–12.0)
NEUTROS ABS: 4.7 10*3/uL (ref 1.4–7.7)
Neutrophils Relative %: 72 % (ref 43.0–77.0)
PLATELETS: 249 10*3/uL (ref 150.0–400.0)
RBC: 4.35 Mil/uL (ref 3.87–5.11)
RDW: 14.2 % (ref 11.5–15.5)
WBC: 6.6 10*3/uL (ref 4.0–10.5)

## 2014-11-05 LAB — URINALYSIS, ROUTINE W REFLEX MICROSCOPIC
BILIRUBIN URINE: NEGATIVE
Hgb urine dipstick: NEGATIVE
KETONES UR: NEGATIVE
LEUKOCYTES UA: NEGATIVE
NITRITE: NEGATIVE
SPECIFIC GRAVITY, URINE: 1.025 (ref 1.000–1.030)
Total Protein, Urine: NEGATIVE
URINE GLUCOSE: NEGATIVE
UROBILINOGEN UA: 0.2 (ref 0.0–1.0)
pH: 6 (ref 5.0–8.0)

## 2014-11-05 LAB — LIPID PANEL
CHOL/HDL RATIO: 4
Cholesterol: 184 mg/dL (ref 0–200)
HDL: 41 mg/dL (ref >39.00)
LDL CALC: 125 mg/dL — AB (ref 0–99)
NONHDL: 143.18
Triglycerides: 89 mg/dL (ref 0.0–149.0)
VLDL: 17.8 mg/dL (ref 0.0–40.0)

## 2014-11-05 LAB — TSH: TSH: 1.35 u[IU]/mL (ref 0.35–4.50)

## 2014-11-05 LAB — HEMOGLOBIN A1C: Hgb A1c MFr Bld: 5.7 % (ref 4.6–6.5)

## 2014-11-05 LAB — VITAMIN D 25 HYDROXY (VIT D DEFICIENCY, FRACTURES): VITD: 22.28 ng/mL — ABNORMAL LOW (ref 30.00–100.00)

## 2014-11-05 MED ORDER — ONDANSETRON HCL 4 MG PO TABS: 4.0000 mg | ORAL_TABLET | Freq: Three times a day (TID) | ORAL | Status: DC | PRN

## 2014-11-05 MED ORDER — BENZONATATE 100 MG PO CAPS: 100.0000 mg | ORAL_CAPSULE | Freq: Three times a day (TID) | ORAL | Status: DC | PRN

## 2014-11-05 MED ORDER — ALBUTEROL SULFATE HFA 108 (90 BASE) MCG/ACT IN AERS: 2.0000 | INHALATION_SPRAY | Freq: Four times a day (QID) | RESPIRATORY_TRACT | Status: DC | PRN

## 2014-11-05 MED ORDER — AZITHROMYCIN 250 MG PO TABS: ORAL_TABLET | ORAL | Status: DC

## 2014-11-05 NOTE — Patient Instructions (Addendum)
Please schedule an appointment with psychiatry.  Complete lab work prior to leaving. Schedule a medicare wellness visit at the front desk. Start zpak and tessalon for bronchitis.   Psychiatric Services:  [facility], [place] [address] [place] Letta Moynahan, [address], [place], [place] [facility] [phone], [place], [facility], [address], [place], [place]

## 2014-11-05 NOTE — Progress Notes (Signed)
Pre visit review using our clinic review tool, if applicable. No additional management support is needed unless otherwise documented below in the visit note. 

## 2014-11-05 NOTE — Progress Notes (Signed)
Subjective:    Patient ID: [name], female    DOB: October 22, 1980, 34 y.o.   MRN: [id number]  HPI  [name] is a 34 yr old female who presents today for physical and to address several concerns.  1) Preventative- Pap 2/15- normal, tetanus is up to date.  She would like a flu shot today.  She is not exercising due to back pain.   2) R great toenail fell off.  Reports that she ran over her toe with a vacuum.   3)  Chronic back pain- she was maintained on hydrocodone which was being managed by neurosurgery. They are no longer willing to prescribe her narcotic meds.  She was referred to pain management but urine tested positive for marijuana and they did not refill her meds.  Her last rx for hydrocone per controlled substance registry was on 10/10/14 for 30 tabs. She has not taken any hydrocodone in 5 days.    4) Cough-  Reports that 2 weekends ago she went to her Aunt's house. Was exposed to tobacco smoke which was heavy in the house.  Reports that she then developed a cough.  Initially cough was dry, then became runny. Cough is not dry again. She denies fever. Doesn't think that ventolin works as well as Ship [employer]. Wt Readings from Last 3 Encounters:  11/05/14 182 lb 3.2 oz (82.645 kg)  10/17/14 181 lb (82.101 kg)  10/17/14 181 lb 6.4 oz (82.283 kg)   5) Depression- reports that her mind gets "boggled" doesn't want to leave the house. She has not taken effexor in > 1 month. Didn't feel any better on effexor.  Reports in the past zoloft worsened her symptoms.   + fatigue.    Review of Systems  Constitutional: Negative for unexpected weight change.  HENT: Positive for rhinorrhea.   Eyes: Negative for visual disturbance.  Respiratory: Positive for cough.   Cardiovascular: Negative for leg swelling.  Gastrointestinal: Positive for diarrhea.  Genitourinary: Positive for frequency. Negative for dysuria.  Musculoskeletal: Positive for back pain.  Skin: Negative for rash.    Neurological: Positive for headaches.  Hematological: Negative for adenopathy.   See HPI  Past Medical History  Diagnosis Date  . Family history of anesthesia complication     Nausea  . Respiratory failure 11/2010; 09/2011    Resolved  - History pneumonia  . Chronic back pain   . Asthma     "very badly" (01/17/2012)  . Chronic bronchitis     "@ least once/yr" (01/17/2012) - none since 01/2012  . Exertional dyspnea     Asthma  . GERD (gastroesophageal reflux disease)   . Degenerative disk disease   . Cervical cancer 1999; 2000  . Complication of anesthesia     back surgeries x 6 - fusions T8-S2- rods  . Difficult intubation     "needs small tube"  . PONV (postoperative nausea and vomiting)   . CHF (congestive heart failure) 2006    Resolved -History"with 2006 pregnancy, toxcemia "  not seeing a cardiologist now.  . Pneumonia     "~ once/yr" (01/17/2012) none since 01/2012  . Anxiety     no meds  . Migraines     last migraine 09/2013  . Neuromuscular disorder     sciatica  - affecting left leg numb/pain    Social History   Social History  . Marital Status: Married    Spouse Name: N/A  . Number of Children: 1  . Years  of Education: N/A   Occupational History  . Disabled    Social History Main Topics  . Smoking status: Current Every Day Smoker -- 0.50 packs/day for 15 years    Types: Cigarettes    Last Attempt to Quit: 04/14/2013  . Smokeless tobacco: Never Used  . Alcohol Use: No  . Drug Use: No     Comment: last smoked today March 2015  . Sexual Activity:    Partners: Male    Birth Control/ Protection: IUD     Comment: Last Encounter 02-09-14   Other Topics Concern  . Not on file   Social History Narrative   Married, 2 children   disabled    Past Surgical History  Procedure Laterality Date  . Spinal fusion  10/04/2011    Procedure: FUSION POSTERIOR SPINAL MULTILEVEL;  Surgeon: Lowella Grip, MD;  Location: [facility];  Service: Orthopedics;   Laterality: Bilateral;  repair lumbar pseudoarthritis, revision segmental fixation, L3-L4, L4-L5  . Cervical cone biopsy  1999; 2000    cervical cancer  . Adenoidectomy, tonsillectomy and myringotomy with tube placement  1983?  Marland Kitchen Tonsillectomy    . Knee arthroscopy  2004 X 2    "left X 2" (01/17/2012)  . Lumbar disc surgery  06/1998    "L4-5" (01/17/2012)  . Lumbar fusion  08/1998; 2010; 2012; 06/2011, 02/19/14    "fusion cage; put rods in; reanchored screws; rod broke" (01/17/2012); Had son in 10/2013 and rods in back broke.   Marland Kitchen Anterior lumbar fusion  01/17/2012    Procedure: ANTERIOR LUMBAR FUSION 2 LEVELS;  Surgeon: Lowella Grip, MD;  Location: [facility];  Service: Orthopedics;  Laterality: Bilateral;  Anterior Lumbar 3-4, Lumbar 4-5 fusion with bone graft harvest  . Anterior lumbar fusion    . Cesarean section N/A 11/08/2013    Procedure: REPEAT CESAREAN SECTION;  Surgeon: Shelly Bombard, MD;  Location: [facility];  Service: Obstetrics;  Laterality: N/A;  . Back surgery  02/19/2014    Family History  Problem Relation Age of Onset  . Heart disease Father   . Hypertension Father   . Diabetes Father   . Deep vein thrombosis Father   . Cancer      Allergies  Allergen Reactions  . Ultram [Tramadol] Itching  . Other Rash    Compound Tincture of Benzoin     Current Outpatient Prescriptions on File Prior to Visit  Medication Sig Dispense Refill  . baclofen (LIORESAL) 10 MG tablet     . budesonide-formoterol (SYMBICORT) 80-4.5 MCG/ACT inhaler Inhale 2 puffs into the lungs 2 (two) times daily. 1 Inhaler 3  . diazepam (VALIUM) 10 MG tablet Take 10 mg by mouth.    . esomeprazole (NEXIUM) 40 MG capsule TAKE 1 CAPSULE BY MOUTH DAILY BEFORE BREAKFAST. 30 capsule 11  . loratadine (CLARITIN) 10 MG tablet TAKE 1 TABLET BY MOUTH DAILY. 30 tablet 11  . rizatriptan (MAXALT) 5 MG tablet TAKE 1 TABLET BY MOUTH AT ONSET OF MIGRAINE, MAY REPEAT 2 HOURS LATER AS NEEDED. MAX 2 TABLETS PER DAY. 10 tablet 1    . diclofenac (VOLTAREN) 75 MG EC tablet Take 1 tablet (75 mg total) by mouth 2 (two) times daily. (Patient not taking: Reported on 10/17/2014) 30 tablet 0   No current facility-administered medications on file prior to visit.    BP 90/70 mmHg  Pulse 68  Temp(Src) 98 F (36.7 C) (Oral)  Resp 16  Ht 5\' 3"  (1.6 m)  Wt 182 lb  3.2 oz (82.645 kg)  BMI 32.28 kg/m2  SpO2 98%       Objective:   Physical Exam  Physical Exam  Constitutional: She is oriented to person, place, and time. She appears well-developed and well-nourished. No distress.  HENT:  Head: Normocephalic and atraumatic.  Right Ear: Tympanic membrane and ear canal normal.  Left Ear: Tympanic membrane and ear canal normal.  Mouth/Throat: Oropharynx is clear and moist.  Eyes: Pupils are equal, round, and reactive to light. No scleral icterus.  Neck: Normal range of motion. No thyromegaly present.  Cardiovascular: Normal rate and regular rhythm.   No murmur heard. Pulmonary/Chest: Effort normal and breath sounds normal. No respiratory distress. He has no wheezes. She has no rales. She exhibits no tenderness.  Abdominal: Soft. Bowel sounds are normal. He exhibits no distension and no mass. There is no tenderness. There is no rebound and no guarding.  Musculoskeletal: She exhibits no edema.  Lymphadenopathy:    She has no cervical adenopathy.  Neurological: She is alert and oriented to person, place, and time. She has 1+ right pattelar reflexe 2+ L patellar reflex. She exhibits normal muscle tone. Coordination normal.  Skin: Skin is warm and dry. R great toe  is covered with clean dry bandage Psychiatric: She has a normal mood and affect. Her behavior is normal. Judgment and thought content normal.  Breasts: Examined lying Right: Without masses, retractions, discharge or axillary adenopathy.  Left: Without masses, retractions, discharge or axillary adenopathy.        Assessment & Plan:         Assessment & Plan:   Bronchitis- will rx with zpak.  Tessalon prn cough  Osteopenia- obtain dexa  Hyperglycemia- obtain a1c  Hyperlipidemia- obtain flp

## 2014-11-06 ENCOUNTER — Encounter: Payer: Self-pay | Admitting: Family

## 2014-11-06 DIAGNOSIS — Z0000 Encounter for general adult medical examination without abnormal findings: Secondary | ICD-10-CM | POA: Insufficient documentation

## 2014-11-06 DIAGNOSIS — E559 Vitamin D deficiency, unspecified: Secondary | ICD-10-CM | POA: Insufficient documentation

## 2014-11-06 LAB — URINE CULTURE
Colony Count: NO GROWTH
Organism ID, Bacteria: NO GROWTH

## 2014-11-06 NOTE — Assessment & Plan Note (Signed)
Flu shot today.  Discussed healthy diet and exercise as tolerated.

## 2014-11-06 NOTE — Assessment & Plan Note (Signed)
Advised pt to schedule an appointment with psychiatry for ongoing management of her depression.

## 2014-11-06 NOTE — Assessment & Plan Note (Signed)
We discussed referral to a different pain specialist. She declines, wishes to remain off of narcotics.

## 2014-11-07 ENCOUNTER — Telehealth: Payer: Self-pay | Admitting: *Deleted

## 2014-11-07 DIAGNOSIS — E559 Vitamin D deficiency, unspecified: Secondary | ICD-10-CM

## 2014-11-07 MED ORDER — ERGOCALCIFEROL 1.25 MG (50000 UT) PO CAPS: 50000.0000 [IU] | ORAL_CAPSULE | ORAL | Status: DC

## 2014-11-07 NOTE — Telephone Encounter (Signed)
Notified pt and she voices understanding.  Rx sent to [facility]. Lab appt scheduled for 01/30/15 at 2pm.  Future lab order entered.

## 2014-11-07 NOTE — Telephone Encounter (Signed)
-----   Message from Debbrah Alar, NP sent at 11/06/2014  2:11 PM EDT ----- All labs look good, except vitamin D level is low.  Advise patient to begin vit D 50000 units once weekly for 12 weeks, then repeat vit D level (dx Vit D deficiency).

## 2014-11-17 ENCOUNTER — Telehealth: Payer: Self-pay | Admitting: Family

## 2014-11-17 DIAGNOSIS — G894 Chronic pain syndrome: Secondary | ICD-10-CM

## 2014-11-17 NOTE — Telephone Encounter (Signed)
Pt states that you talked with her about pain mgmt. She said that she would like another referral sent, anywhere except [facility]. She said they only suggested injections and she cannot do that. She said there is one at [facility] in [place]. Please notify pt once the referral is sent so she can call for an appt.

## 2014-11-17 NOTE — Telephone Encounter (Signed)
[name]- will you please call pt when referral is faxed?

## 2014-11-19 NOTE — Telephone Encounter (Signed)
Pt aware referral has been faxed

## 2014-11-21 ENCOUNTER — Ambulatory Visit (HOSPITAL_BASED_OUTPATIENT_CLINIC_OR_DEPARTMENT_OTHER)
Admission: RE | Admit: 2014-11-21 | Discharge: 2014-11-21 | Disposition: A | Discharge status: Home / Self Care | Payer: Medicare HMO | Source: Ambulatory Visit | Attending: Family | Admitting: Family

## 2014-11-21 DIAGNOSIS — F17200 Nicotine dependence, unspecified, uncomplicated: Secondary | ICD-10-CM | POA: Insufficient documentation

## 2014-11-21 DIAGNOSIS — M8580 Other specified disorders of bone density and structure, unspecified site: Secondary | ICD-10-CM | POA: Insufficient documentation

## 2014-11-21 DIAGNOSIS — Z780 Asymptomatic menopausal state: Secondary | ICD-10-CM | POA: Insufficient documentation

## 2014-11-22 ENCOUNTER — Encounter: Payer: Self-pay | Admitting: Family

## 2014-12-09 ENCOUNTER — Telehealth: Payer: Self-pay | Admitting: Behavioral Health

## 2014-12-09 NOTE — Telephone Encounter (Signed)
Patient is unavailable at this time. Per the patient's spouse, he will have her to  return the call later.

## 2014-12-10 ENCOUNTER — Ambulatory Visit (INDEPENDENT_AMBULATORY_CARE_PROVIDER_SITE_OTHER): Payer: Medicare HMO | Admitting: Family

## 2014-12-10 ENCOUNTER — Telehealth: Payer: Self-pay | Admitting: Family

## 2014-12-10 ENCOUNTER — Encounter: Payer: Self-pay | Admitting: Family

## 2014-12-10 VITALS — BP 118/76 | HR 85 | Temp 98.2°F | Resp 14 | Ht 63.0 in | Wt 183.0 lb

## 2014-12-10 DIAGNOSIS — Z0000 Encounter for general adult medical examination without abnormal findings: Secondary | ICD-10-CM | POA: Diagnosis not present

## 2014-12-10 DIAGNOSIS — R0602 Shortness of breath: Secondary | ICD-10-CM

## 2014-12-10 MED ORDER — OXYCODONE HCL 10 MG PO TABS: ORAL_TABLET | ORAL | Status: DC

## 2014-12-10 NOTE — Telephone Encounter (Signed)
Caller name: [place]   Relationship to patient: Self  Can be reached: [phone]    Reason for call: Pt called in because she want to know if provider can order an MRI for her because she is having back pain , ALSO pt says that she is trying to quit smoking and want to know if she can be prescribed Chantix?

## 2014-12-10 NOTE — Progress Notes (Signed)
Subjective:    Patient ID: [name], female    DOB: 12/14/1980, 34 y.o.   MRN: [id number]  HPI  Patient presents today for complete physical.  Immunizations: tetanus up to date Diet: reports that she is trying to eat healthy Exercise: unable- does stretches Pap Smear: 2015 Wt Readings from Last 3 Encounters:  12/10/14 183 lb (83.008 kg)  11/05/14 182 lb 3.2 oz (82.645 kg)  10/17/14 181 lb (82.101 kg)     Review of Systems  Constitutional: Negative for unexpected weight change.  HENT: Negative for rhinorrhea.   Respiratory: Negative for cough.        Reports that she becomes short of breath with walking some times  Cardiovascular: Negative for chest pain.  Gastrointestinal: Negative for constipation.       Has had few episodes of loosw stools.   Genitourinary: Negative for dysuria, frequency and menstrual problem.  Musculoskeletal: Positive for back pain.  Skin: Negative for rash.  Neurological: Positive for headaches.       Uses maxalt prn with some improvement in her HA's  Hematological: Negative for adenopathy.  Psychiatric/Behavioral:       Plans to see a counselor for anxiety.     Past Medical History  Diagnosis Date  . Family history of anesthesia complication     Nausea  . Respiratory failure ([place]) 11/2010; 09/2011    Resolved  - History pneumonia  . Chronic back pain   . Asthma     "very badly" (01/17/2012)  . Chronic bronchitis ([place])     "@ least once/yr" (01/17/2012) - none since 01/2012  . Exertional dyspnea     Asthma  . GERD (gastroesophageal reflux disease)   . Degenerative disk disease   . Cervical cancer ([place]) 1999; 2000  . Complication of anesthesia     back surgeries x 6 - fusions T8-S2- rods  . Difficult intubation     "needs small tube"  . PONV (postoperative nausea and vomiting)   . CHF (congestive heart failure) ([place]) 2006    Resolved -History"with 2006 pregnancy, toxcemia "  not seeing a cardiologist now.  . Pneumonia     "~  once/yr" (01/17/2012) none since 01/2012  . Anxiety     no meds  . Migraines     last migraine 09/2013  . Neuromuscular disorder ([facility])     sciatica  - affecting left leg numb/pain    Social History   Social History  . Marital Status: Married    Spouse Name: N/A  . Number of Children: 1  . Years of Education: N/A   Occupational History  . Disabled    Social History Main Topics  . Smoking status: Current Every Day Smoker -- 0.50 packs/day for 15 years    Types: Cigarettes    Last Attempt to Quit: 04/14/2013  . Smokeless tobacco: Never Used  . Alcohol Use: No  . Drug Use: No     Comment: last smoked today March 2015  . Sexual Activity:    Partners: Male    Birth Control/ Protection: IUD     Comment: Last Encounter 02-09-14   Other Topics Concern  . Not on file   Social History Narrative   Married, 2 children   disabled    Past Surgical History  Procedure Laterality Date  . Spinal fusion  10/04/2011    Procedure: FUSION POSTERIOR SPINAL MULTILEVEL;  Surgeon: Lowella Grip, MD;  Location: [facility];  Service: Orthopedics;  Laterality: Bilateral;  repair lumbar pseudoarthritis, revision segmental fixation, L3-L4, L4-L5  . Cervical cone biopsy  1999; 2000    cervical cancer  . Adenoidectomy, tonsillectomy and myringotomy with tube placement  1983?  Marland Kitchen Tonsillectomy    . Knee arthroscopy  2004 X 2    "left X 2" (01/17/2012)  . Lumbar disc surgery  06/1998    "L4-5" (01/17/2012)  . Lumbar fusion  08/1998; 2010; 2012; 06/2011, 02/19/14    "fusion cage; put rods in; reanchored screws; rod broke" (01/17/2012); Had son in 10/2013 and rods in back broke.   Marland Kitchen Anterior lumbar fusion  01/17/2012    Procedure: ANTERIOR LUMBAR FUSION 2 LEVELS;  Surgeon: Lowella Grip, MD;  Location: [facility];  Service: Orthopedics;  Laterality: Bilateral;  Anterior Lumbar 3-4, Lumbar 4-5 fusion with bone graft harvest  . Anterior lumbar fusion    . Cesarean section N/A 11/08/2013    Procedure: REPEAT  CESAREAN SECTION;  Surgeon: Shelly Bombard, MD;  Location: [facility];  Service: Obstetrics;  Laterality: N/A;  . Back surgery  02/19/2014    Family History  Problem Relation Age of Onset  . Heart disease Father   . Hypertension Father   . Diabetes Father   . Deep vein thrombosis Father   . Cancer      Allergies  Allergen Reactions  . Ultram [Tramadol] Itching  . Other Rash    Compound Tincture of Benzoin     Current Outpatient Prescriptions on File Prior to Visit  Medication Sig Dispense Refill  . albuterol (PROVENTIL HFA;VENTOLIN HFA) 108 (90 BASE) MCG/ACT inhaler Inhale 2 puffs into the lungs every 6 (six) hours as needed for wheezing or shortness of breath. 1 Inhaler 2  . azithromycin (ZITHROMAX) 250 MG tablet 2 tabs by mouth today,then one tab by mouth once daily for 4 more days. 6 tablet 0  . baclofen (LIORESAL) 10 MG tablet     . benzonatate (TESSALON) 100 MG capsule Take 1 capsule (100 mg total) by mouth 3 (three) times daily as needed. 20 capsule 0  . diazepam (VALIUM) 10 MG tablet Take 10 mg by mouth.    . diclofenac (VOLTAREN) 75 MG EC tablet Take 1 tablet (75 mg total) by mouth 2 (two) times daily. (Patient not taking: Reported on 10/17/2014) 30 tablet 0  . ergocalciferol (VITAMIN D2) 50000 UNITS capsule Take 1 capsule (50,000 Units total) by mouth once a week. 12 capsule 0  . esomeprazole (NEXIUM) 40 MG capsule TAKE 1 CAPSULE BY MOUTH DAILY BEFORE BREAKFAST. 30 capsule 11  . loratadine (CLARITIN) 10 MG tablet TAKE 1 TABLET BY MOUTH DAILY. 30 tablet 11  . ondansetron (ZOFRAN) 4 MG tablet Take 1 tablet (4 mg total) by mouth every 8 (eight) hours as needed for nausea or vomiting. 20 tablet 0  . rizatriptan (MAXALT) 5 MG tablet TAKE 1 TABLET BY MOUTH AT ONSET OF MIGRAINE, MAY REPEAT 2 HOURS LATER AS NEEDED. MAX 2 TABLETS PER DAY. 10 tablet 1  . SYMBICORT 80-4.5 MCG/ACT inhaler INHALE 2 PUFFS INTO THE LUNGS 2 TIMES DAILY. 10.2 g 5   No current facility-administered medications on  file prior to visit.    BP 118/76 mmHg  Pulse 85  Temp(Src) 98.2 F (36.8 C) (Oral)  Resp 14  Ht 5\' 3"  (1.6 m)  Wt 183 lb (83.008 kg)  BMI 32.43 kg/m2  SpO2 100%       Objective:   Physical Exam Physical Exam  Constitutional: She is oriented to person, place,  and time. She appears well-developed and well-nourished. No distress.  HENT:  Head: Normocephalic and atraumatic.  Right Ear: Tympanic membrane and ear canal normal.  Left Ear: Tympanic membrane and ear canal normal.  Mouth/Throat: Oropharynx is clear and moist.  Eyes: Pupils are equal, round, and reactive to light. No scleral icterus.  Neck: Normal range of motion. No thyromegaly present.  Cardiovascular: Normal rate and regular rhythm.   No murmur heard. Pulmonary/Chest: Effort normal and breath sounds normal. No respiratory distress. He has no wheezes. She has no rales. She exhibits no tenderness.  Abdominal: Soft. Bowel sounds are normal. He exhibits no distension and no mass. There is no tenderness. There is no rebound and no guarding.  Musculoskeletal: She exhibits no edema.  Lymphadenopathy:    She has no cervical adenopathy.  Neurological: She is alert and oriented to person, place, and time. She has normal reflexes. She exhibits normal muscle tone. Coordination normal.  Skin: Skin is warm and dry.  Psychiatric: She has a normal mood and affect. Her behavior is normal. Judgment and thought content normal.  Breasts: Examined lying Right: Without masses, retractions, discharge or axillary adenopathy.  Left: Without masses, retractions, discharge or axillary adenopathy.  Pelvic: deferred to GYN        Assessment & Plan:          Assessment & Plan:  EKG tracing is personally reviewed.  EKG notes NSR.  No acute changes.   Preventative care- refer for mammogram given family hx of breast CA.  Labs are up to date.  Immunizations up to date.  SOB on exertion- will refer for stress test given family hx of  early CAD

## 2014-12-10 NOTE — Patient Instructions (Addendum)
You will be contacted about your stress test. Schedule mammogram on the first floor.

## 2014-12-10 NOTE — Progress Notes (Signed)
Pre visit review using our clinic review tool, if applicable. No additional management support is needed unless otherwise documented below in the visit note. 

## 2014-12-11 ENCOUNTER — Other Ambulatory Visit: Payer: Self-pay | Admitting: Family

## 2014-12-12 MED ORDER — VARENICLINE TARTRATE 0.5 MG X 11 & 1 MG X 42 PO MISC: ORAL | Status: DC

## 2014-12-12 NOTE — Telephone Encounter (Signed)
Please advise 

## 2014-12-12 NOTE — Telephone Encounter (Signed)
Rx faxed to pharmacy, left message for pt to return my call. 

## 2014-12-12 NOTE — Telephone Encounter (Addendum)
Since she has such a complicated spinal history, I think it would be best for her to follow back up with neurosurgery. If she would like a referral to a different neurosurgeon I can arrange.    rx sent for chantix. Please let pt know common side effects include dizziness, nausea, vivid dreams. Rare side effect is suicide ideation.  If this occurs she should proceed to the ER.  Patient can continue to smoke for 1 week after starting chantix, but then must discontinue cigarettes. Contact us prior to completion of the starter month pack for an rx for the continuation month pack.

## 2014-12-12 NOTE — Addendum Note (Signed)
Addended by: Debbrah Alar on: 12/12/2014 03:26 PM   Modules accepted: Orders

## 2014-12-15 ENCOUNTER — Telehealth (HOSPITAL_COMMUNITY): Payer: Self-pay | Admitting: *Deleted

## 2014-12-15 ENCOUNTER — Ambulatory Visit (HOSPITAL_BASED_OUTPATIENT_CLINIC_OR_DEPARTMENT_OTHER): Payer: Medicare HMO

## 2014-12-15 NOTE — Telephone Encounter (Signed)
Notified pt of below recommendation. She states that pain management wants her to have an MRI and she checked with [facility] insurance last week regarding coverage and they told her that her PCP would have to order the test for insurance to pay for it.  Advised pt that she should contact pain management and discuss this with them. If they are not able to obtain approval for this same reason they need to contact us. Pt voices understanding.

## 2014-12-23 ENCOUNTER — Telehealth: Payer: Self-pay | Admitting: Family

## 2014-12-23 NOTE — Telephone Encounter (Signed)
Caller name: Danae Chen with [facility] Can be reached: [phone]  Reason for call: Please call back for peer to peer review regarding nuclear stress test. Speak with Dr. Uvaldo Bristle ph# [phone].

## 2014-12-24 ENCOUNTER — Telehealth: Payer: Self-pay | Admitting: Family

## 2014-12-24 DIAGNOSIS — R0609 Other forms of dyspnea: Principal | ICD-10-CM

## 2014-12-24 NOTE — Telephone Encounter (Signed)
Noted  

## 2014-12-24 NOTE — Telephone Encounter (Signed)
Spoke with patient.  She did not want to do the stress test anyway.  She does not want a referral to Cardiology.  She cannot go to see a specialist at this time.  She feels she is ok as her ekg was ok

## 2014-12-24 NOTE — Telephone Encounter (Signed)
Left message requesting  call back.

## 2014-12-24 NOTE — Telephone Encounter (Signed)
See additional phone note. D/c stress test order, will refer to cardiology.

## 2014-12-24 NOTE — Telephone Encounter (Signed)
Spoke to insurance peer to peer and they decline nuclear stress test. Recommending a cardiac CT or a dobutamine stress echo first.  Will refer to cardiology for further evaluation and treatment since I don't usually order these tests.  Please notify pt.

## 2015-01-16 ENCOUNTER — Other Ambulatory Visit: Payer: Self-pay | Admitting: Family

## 2015-01-16 NOTE — Telephone Encounter (Signed)
Pt completed starting month pack? Please advise.

## 2015-01-18 MED ORDER — VARENICLINE TARTRATE 1 MG PO TABS: 1.0000 mg | ORAL_TABLET | Freq: Two times a day (BID) | ORAL | Status: DC

## 2015-01-30 ENCOUNTER — Other Ambulatory Visit: Payer: Medicare HMO

## 2015-02-14 ENCOUNTER — Encounter: Payer: Self-pay | Admitting: Family Medicine

## 2015-02-14 ENCOUNTER — Ambulatory Visit (INDEPENDENT_AMBULATORY_CARE_PROVIDER_SITE_OTHER): Payer: Medicare HMO | Admitting: Family Medicine

## 2015-02-14 VITALS — BP 124/70 | HR 81 | Temp 97.9°F

## 2015-02-14 DIAGNOSIS — H1031 Unspecified acute conjunctivitis, right eye: Secondary | ICD-10-CM

## 2015-02-14 DIAGNOSIS — J019 Acute sinusitis, unspecified: Secondary | ICD-10-CM | POA: Diagnosis not present

## 2015-02-14 DIAGNOSIS — M25521 Pain in right elbow: Secondary | ICD-10-CM | POA: Diagnosis not present

## 2015-02-14 MED ORDER — ERYTHROMYCIN 5 MG/GM OP OINT: 1.0000 "application " | TOPICAL_OINTMENT | Freq: Three times a day (TID) | OPHTHALMIC | Status: DC

## 2015-02-14 MED ORDER — AMOXICILLIN-POT CLAVULANATE 875-125 MG PO TABS: 1.0000 | ORAL_TABLET | Freq: Two times a day (BID) | ORAL | Status: AC

## 2015-02-14 MED ORDER — AMOXICILLIN-POT CLAVULANATE 875-125 MG PO TABS: 1.0000 | ORAL_TABLET | Freq: Two times a day (BID) | ORAL | Status: DC

## 2015-02-14 NOTE — Progress Notes (Signed)
BP 124/70 mmHg  Pulse 81  Temp(Src) 97.9 F (36.6 C) (Oral)  SpO2 98%   CC: sinusitis, fall with elbow pain  Subjective:    Patient ID: [name], female    DOB: 1980-11-24, 34 y.o.   MRN: [id number]  HPI: [name] is a 34 y.o. female presenting on 02/14/2015 for Sinusitis; Conjunctivitis; and Fall   3 wk h/o sinus congestion and pressure headache. ST started today. R eye red for last 3 days and itchy. Staying cold. initially with coughing now better. Mild PNDrainage.   Taking pataday eye drops, pseudophed, alka seltzer cold plus.   No fevers/chills, ear or tooth pain.   Husband and son ill last week.  Quit smoking 2 months ago.  + asthma and COPD per patient.  H/o PNA in the past.   Yesterday had fall onto R elbow. Taking aleve for this. FROM at elbow. Chronic back pain. Not currently on any narcotic pain meds.  Relevant past medical, surgical, family and social history reviewed and updated as indicated. Interim medical history since our last visit reviewed. Allergies and medications reviewed and updated. Current Outpatient Prescriptions on File Prior to Visit  Medication Sig  . albuterol (PROVENTIL HFA;VENTOLIN HFA) 108 (90 BASE) MCG/ACT inhaler Inhale 2 puffs into the lungs every 6 (six) hours as needed for wheezing or shortness of breath.  . baclofen (LIORESAL) 10 MG tablet   . diazepam (VALIUM) 10 MG tablet Take 10 mg by mouth.  . ergocalciferol (VITAMIN D2) 50000 UNITS capsule Take 1 capsule (50,000 Units total) by mouth once a week.  . esomeprazole (NEXIUM) 40 MG capsule TAKE 1 CAPSULE BY MOUTH DAILY BEFORE BREAKFAST.  Marland Kitchen loratadine (CLARITIN) 10 MG tablet TAKE 1 TABLET BY MOUTH DAILY.  Marland Kitchen varenicline (CHANTIX CONTINUING MONTH PAK) 1 MG tablet Take 1 tablet (1 mg total) by mouth 2 (two) times daily.  . ondansetron (ZOFRAN) 4 MG tablet Take 1 tablet (4 mg total) by mouth every 8 (eight) hours as needed for nausea or vomiting. (Patient not taking:  Reported on 02/14/2015)  . rizatriptan (MAXALT) 5 MG tablet TAKE 1 TABLET BY MOUTH AT ONSET OF MIGRAINE, MAY REPEAT 2 HOURS LATER AS NEEDED. MAX 2 TABLETS PER DAY. (Patient not taking: Reported on 02/14/2015)   No current facility-administered medications on file prior to visit.    Review of Systems Per HPI unless specifically indicated in ROS section     Objective:    BP 124/70 mmHg  Pulse 81  Temp(Src) 97.9 F (36.6 C) (Oral)  SpO2 98%  Wt Readings from Last 3 Encounters:  12/10/14 183 lb (83.008 kg)  11/05/14 182 lb 3.2 oz (82.645 kg)  10/17/14 181 lb (82.101 kg)    Physical Exam  Constitutional: She appears well-developed and well-nourished. No distress.  HENT:  Head: Normocephalic and atraumatic.  Right Ear: Hearing, tympanic membrane, external ear and ear canal normal.  Left Ear: Hearing, tympanic membrane, external ear and ear canal normal.  Nose: Mucosal edema present. No rhinorrhea. Right sinus exhibits maxillary sinus tenderness and frontal sinus tenderness. Left sinus exhibits maxillary sinus tenderness and frontal sinus tenderness.  Mouth/Throat: Uvula is midline and mucous membranes are normal. No oropharyngeal exudate, posterior oropharyngeal edema, posterior oropharyngeal erythema or tonsillar abscesses.  Eyes: EOM are normal. Pupils are equal, round, and reactive to light. Right conjunctiva is injected. No scleral icterus.  Bulbar and palpebral conjunctival injection Limbic sparing   Neck: Normal range of motion. Neck supple.  Cardiovascular: Normal  rate, regular rhythm, normal heart sounds and intact distal pulses.   No murmur heard. Pulmonary/Chest: Effort normal and breath sounds normal. No respiratory distress. She has no wheezes. She has no rales.  Musculoskeletal: She exhibits no edema.  L elbow WNL R elbow FROM flexion/extension. Tender to palpation lateral epicondyle. No olecranon pain, no AC fullness No shoulder or upper arm pain  Lymphadenopathy:     She has no cervical adenopathy.  Skin: Skin is warm and dry. No rash noted.  Nursing note and vitals reviewed.      Assessment & Plan:   Problem List Items Addressed This Visit    Right elbow pain    Likely bony contusion, doubt lateral epicondylitis.  Treat with aleve, ice /heating pad.  Return if not better over the next week, sooner if worsening.       Acute sinusitis - Primary    Given duration and progression of sxs, treat as acute bacterial sinusitis with augmentin antibiotic course.  Update if sxs persist after treatment or not improving as expected with treatment.  See pt instructions for further recomendations.      Relevant Medications   amoxicillin-clavulanate (AUGMENTIN) 875-125 MG tablet   Acute conjunctivitis of right eye    Anticipate viral. Treat with warm compresses, OTC lubricating eye drops. Provided with script for erythromycin ointment to fill if worsening purulent drainage to cover bacterial infection, but discussed likely currently viral. Discussed contagious nature of condition.          Follow up plan: Return if symptoms worsen or fail to improve.

## 2015-02-14 NOTE — Patient Instructions (Signed)
You have a sinus infection. Take medicine as prescribed: augmentin course  Push fluids and plenty of rest. Nasal saline irrigation or neti pot to help drain sinuses. Aleve 2 tablets twice daily with food for inflammation (and elbow) May use plain mucinex with plenty of fluid to help mobilize mucous. Please let us know if fever >101.5, trouble opening/closing mouth, difficulty swallowing, or worsening instead of improving as expected. For R elbow pain - likely bony contusion. Treat with aleve, ice or heating pad. Return if not better over the next week, sooner if worsening.

## 2015-02-14 NOTE — Progress Notes (Signed)
Pre visit review using our clinic review tool, if applicable. No additional management support is needed unless otherwise documented below in the visit note. 

## 2015-02-16 DIAGNOSIS — H1031 Unspecified acute conjunctivitis, right eye: Secondary | ICD-10-CM | POA: Insufficient documentation

## 2015-02-16 DIAGNOSIS — M25521 Pain in right elbow: Secondary | ICD-10-CM | POA: Insufficient documentation

## 2015-02-16 DIAGNOSIS — J019 Acute sinusitis, unspecified: Secondary | ICD-10-CM | POA: Insufficient documentation

## 2015-02-16 NOTE — Assessment & Plan Note (Addendum)
Anticipate viral. Treat with warm compresses, OTC lubricating eye drops. Provided with script for erythromycin ointment to fill if worsening purulent drainage to cover bacterial infection, but discussed likely currently viral. Discussed contagious nature of condition.

## 2015-02-16 NOTE — Assessment & Plan Note (Addendum)
Given duration and progression of sxs, treat as acute bacterial sinusitis with augmentin antibiotic course.  Update if sxs persist after treatment or not improving as expected with treatment.  See pt instructions for further recomendations.

## 2015-02-16 NOTE — Assessment & Plan Note (Signed)
Likely bony contusion, doubt lateral epicondylitis.  Treat with aleve, ice /heating pad.  Return if not better over the next week, sooner if worsening.

## 2015-03-06 ENCOUNTER — Ambulatory Visit (INDEPENDENT_AMBULATORY_CARE_PROVIDER_SITE_OTHER): Payer: Medicare HMO | Admitting: Family Medicine

## 2015-03-06 ENCOUNTER — Encounter: Payer: Self-pay | Admitting: Family Medicine

## 2015-03-06 VITALS — BP 108/68 | HR 77 | Temp 98.0°F | Wt 196.4 lb

## 2015-03-06 DIAGNOSIS — J209 Acute bronchitis, unspecified: Secondary | ICD-10-CM

## 2015-03-06 DIAGNOSIS — Z91048 Other nonmedicinal substance allergy status: Secondary | ICD-10-CM

## 2015-03-06 DIAGNOSIS — J4520 Mild intermittent asthma, uncomplicated: Secondary | ICD-10-CM | POA: Diagnosis not present

## 2015-03-06 DIAGNOSIS — E559 Vitamin D deficiency, unspecified: Secondary | ICD-10-CM | POA: Diagnosis not present

## 2015-03-06 DIAGNOSIS — Z9109 Other allergy status, other than to drugs and biological substances: Secondary | ICD-10-CM

## 2015-03-06 MED ORDER — LEVOCETIRIZINE DIHYDROCHLORIDE 5 MG PO TABS: 5.0000 mg | ORAL_TABLET | Freq: Every evening | ORAL | Status: DC

## 2015-03-06 MED ORDER — AZITHROMYCIN 250 MG PO TABS: ORAL_TABLET | ORAL | Status: DC

## 2015-03-06 MED ORDER — PREDNISONE 10 MG PO TABS: ORAL_TABLET | ORAL | Status: DC

## 2015-03-06 MED ORDER — BUDESONIDE-FORMOTEROL FUMARATE 160-4.5 MCG/ACT IN AERO: 2.0000 | INHALATION_SPRAY | Freq: Two times a day (BID) | RESPIRATORY_TRACT | Status: DC

## 2015-03-06 NOTE — Progress Notes (Signed)
Pre visit review using our clinic review tool, if applicable. No additional management support is needed unless otherwise documented below in the visit note. 

## 2015-03-06 NOTE — Patient Instructions (Signed)

## 2015-03-07 LAB — VITAMIN D 25 HYDROXY (VIT D DEFICIENCY, FRACTURES): VIT D 25 HYDROXY: 22 ng/mL — AB (ref 30–100)

## 2015-03-07 NOTE — Progress Notes (Signed)
Subjective:     [name] is a 35 y.o. female who presents for evaluation of sinus pain. Symptoms include: congestion, cough, facial pain, nasal congestion and sinus pressure. Onset of symptoms was a few weeks ago. Symptoms have been gradually worsening since that time. Past history is significant for no history of pneumonia or bronchitis. Patient is a non-smoker.  The following portions of the patient's history were reviewed and updated as appropriate:  She  has a past medical history of Family history of anesthesia complication; Respiratory failure ([place]) (11/2010; 09/2011); Chronic back pain; Asthma; Chronic bronchitis ([place]); Exertional dyspnea; GERD (gastroesophageal reflux disease); Degenerative disk disease; Cervical cancer ([place]) (1999; 2000); Complication of anesthesia; Difficult intubation; PONV (postoperative nausea and vomiting); CHF (congestive heart failure) ([place]) (2006); Pneumonia; Anxiety; Migraines; and Neuromuscular disorder ([place]). She  does not have any pertinent problems on file. She  has past surgical history that includes Spinal fusion (10/04/2011); Cervical cone biopsy (1999; 2000); Adenoidectomy, tonsillectomy and myringotomy with tube placement (1983?); Tonsillectomy; Knee arthroscopy (2004 X 2); Lumbar disc surgery (06/1998); Lumbar fusion (08/1998; 2010; 2012; 06/2011, 02/19/14); Anterior lumbar fusion (01/17/2012); Anterior lumbar fusion; Cesarean section (N/A, 11/08/2013); and Back surgery (02/19/2014). Her family history includes COPD in her mother; Deep vein thrombosis in her father; Diabetes in her father; Heart attack in her sister; Heart disease in her father; Hypertension in her father. She  reports that she has been smoking Cigarettes.  She has a 7.5 pack-year smoking history. She has never used smokeless tobacco. She reports that she does not drink alcohol or use illicit drugs. She has a current medication list which includes the following prescription(s): albuterol,  azithromycin, baclofen, budesonide-formoterol, diazepam, ergocalciferol, esomeprazole, levocetirizine, ondansetron, prednisone, rizatriptan, and varenicline. Current Outpatient Prescriptions on File Prior to Visit  Medication Sig Dispense Refill  . albuterol (PROVENTIL HFA;VENTOLIN HFA) 108 (90 BASE) MCG/ACT inhaler Inhale 2 puffs into the lungs every 6 (six) hours as needed for wheezing or shortness of breath. 1 Inhaler 2  . baclofen (LIORESAL) 10 MG tablet     . diazepam (VALIUM) 10 MG tablet Take 10 mg by mouth.    . ergocalciferol (VITAMIN D2) 50000 UNITS capsule Take 1 capsule (50,000 Units total) by mouth once a week. 12 capsule 0  . esomeprazole (NEXIUM) 40 MG capsule TAKE 1 CAPSULE BY MOUTH DAILY BEFORE BREAKFAST. 30 capsule 11  . ondansetron (ZOFRAN) 4 MG tablet Take 1 tablet (4 mg total) by mouth every 8 (eight) hours as needed for nausea or vomiting. (Patient not taking: Reported on 02/14/2015) 20 tablet 0  . rizatriptan (MAXALT) 5 MG tablet TAKE 1 TABLET BY MOUTH AT ONSET OF MIGRAINE, MAY REPEAT 2 HOURS LATER AS NEEDED. MAX 2 TABLETS PER DAY. (Patient not taking: Reported on 02/14/2015) 10 tablet 1  . varenicline (CHANTIX CONTINUING MONTH PAK) 1 MG tablet Take 1 tablet (1 mg total) by mouth 2 (two) times daily. 60 tablet 1   No current facility-administered medications on file prior to visit.   She is allergic to ultram and other..  Review of Systems Pertinent items are noted in HPI.   Objective:    BP 108/68 mmHg  Pulse 77  Temp(Src) 98 F (36.7 C) (Oral)  Wt 196 lb 6.4 oz (89.086 kg)  SpO2 98% General appearance: alert, cooperative, appears stated age and no distress Ears: normal TM's and external ear canals both ears Nose: green discharge, moderate congestion, turbinates red, swollen, sinus tenderness bilateral Throat: abnormal findings: mild oropharyngeal erythema Neck:  mild anterior cervical adenopathy, supple, symmetrical, trachea midline and thyroid not enlarged,  symmetric, no tenderness/mass/nodules Lungs: + rhonchi, no rales Heart: S1, S2 normal    Assessment:    Acute bronchitis.    Plan:               Cough meds abx per orders  ,

## 2015-03-08 ENCOUNTER — Other Ambulatory Visit: Payer: Self-pay | Admitting: Family

## 2015-03-08 NOTE — Telephone Encounter (Signed)
Vitamin D level is low.  Advise patient to restarr vit D 50000 units once weekly for 12 weeks, then repeat vit D level (dx Vit D deficiency).

## 2015-03-09 ENCOUNTER — Other Ambulatory Visit: Payer: Medicare HMO

## 2015-03-09 NOTE — Telephone Encounter (Signed)
Attempted to reach pt but voice mailbox has not been set up yet.

## 2015-03-13 MED ORDER — ERGOCALCIFEROL 1.25 MG (50000 UT) PO CAPS: 50000.0000 [IU] | ORAL_CAPSULE | ORAL | Status: DC

## 2015-03-13 NOTE — Telephone Encounter (Signed)
Notified pt and she states she will not be able to afford medication until 03/20/15. Rx sent. Pt has f/u with PCP on 06/10/15 and will complete vitamin D at that time.

## 2015-04-20 ENCOUNTER — Ambulatory Visit: Payer: Medicare HMO | Admitting: Obstetrics

## 2015-04-22 ENCOUNTER — Ambulatory Visit: Payer: Self-pay | Admitting: Obstetrics

## 2015-06-10 ENCOUNTER — Ambulatory Visit: Payer: Self-pay | Admitting: Family

## 2015-07-06 HISTORY — PX: SPINAL FUSION: SHX223

## 2015-07-15 ENCOUNTER — Other Ambulatory Visit: Payer: Self-pay | Admitting: Family

## 2015-07-15 NOTE — Telephone Encounter (Signed)
Ventolin refill sent to pharmacy. Pt was due for 6 month follow up with Y[facility] on 06/11/15 and is past due.  Please call pt to schedule follow up as she needs OV before further refills can be given. Thanks!

## 2015-07-16 NOTE — Telephone Encounter (Signed)
Called patient to schedule OV as per below. Patient stated that she did not understand why she had to be seen for future inhalers. Stated that she would understand if it was a narcotic and she just had back surgery and have appointments scheduled with her surgeon. Declined to schedule appointment and stated that she was out walking and would call back to schedule OV.

## 2015-07-16 NOTE — Telephone Encounter (Signed)
Please call patient to schedule as note requested

## 2015-07-17 NOTE — Telephone Encounter (Signed)
See patients response

## 2015-07-17 NOTE — Telephone Encounter (Signed)
Noted and agree with need for follow up visit.

## 2015-08-03 ENCOUNTER — Emergency Department (HOSPITAL_BASED_OUTPATIENT_CLINIC_OR_DEPARTMENT_OTHER)
Admission: EM | Admit: 2015-08-03 | Discharge: 2015-08-03 | Disposition: A | Discharge status: Home / Self Care | Payer: Medicare Other | Attending: Emergency Medicine | Admitting: Emergency Medicine

## 2015-08-03 ENCOUNTER — Ambulatory Visit: Payer: Self-pay | Admitting: Family

## 2015-08-03 ENCOUNTER — Encounter (HOSPITAL_BASED_OUTPATIENT_CLINIC_OR_DEPARTMENT_OTHER): Payer: Self-pay | Admitting: Emergency Medicine

## 2015-08-03 DIAGNOSIS — Y829 Unspecified medical devices associated with adverse incidents: Secondary | ICD-10-CM | POA: Diagnosis not present

## 2015-08-03 DIAGNOSIS — I509 Heart failure, unspecified: Secondary | ICD-10-CM | POA: Insufficient documentation

## 2015-08-03 DIAGNOSIS — T8189XA Other complications of procedures, not elsewhere classified, initial encounter: Secondary | ICD-10-CM

## 2015-08-03 DIAGNOSIS — T814XXA Infection following a procedure, initial encounter: Secondary | ICD-10-CM | POA: Diagnosis not present

## 2015-08-03 DIAGNOSIS — IMO0001 Reserved for inherently not codable concepts without codable children: Secondary | ICD-10-CM

## 2015-08-03 DIAGNOSIS — J45909 Unspecified asthma, uncomplicated: Secondary | ICD-10-CM | POA: Diagnosis not present

## 2015-08-03 DIAGNOSIS — F17210 Nicotine dependence, cigarettes, uncomplicated: Secondary | ICD-10-CM | POA: Diagnosis not present

## 2015-08-03 DIAGNOSIS — Z4801 Encounter for change or removal of surgical wound dressing: Secondary | ICD-10-CM | POA: Diagnosis present

## 2015-08-03 DIAGNOSIS — Z8541 Personal history of malignant neoplasm of cervix uteri: Secondary | ICD-10-CM | POA: Diagnosis not present

## 2015-08-03 LAB — BASIC METABOLIC PANEL
Anion gap: 8 (ref 5–15)
BUN: 7 mg/dL (ref 6–20)
CO2: 30 mmol/L (ref 22–32)
Calcium: 9 mg/dL (ref 8.9–10.3)
Chloride: 101 mmol/L (ref 101–111)
Creatinine, Ser: 0.83 mg/dL (ref 0.44–1.00)
GFR calc Af Amer: >60 mL/min (ref >60)
GFR calc non Af Amer: >60 mL/min (ref >60)
Glucose, Bld: 87 mg/dL (ref 65–99)
Potassium: 3.5 mmol/L (ref 3.5–5.1)
Sodium: 139 mmol/L (ref 135–145)

## 2015-08-03 LAB — CBC WITH DIFFERENTIAL/PLATELET
Basophils Absolute: 0 10*3/uL (ref 0.0–0.1)
Basophils Relative: 0 %
Eosinophils Absolute: 0.4 10*3/uL (ref 0.0–0.7)
Eosinophils Relative: 7 %
HCT: 36.6 % (ref 36.0–46.0)
Hemoglobin: 12 g/dL (ref 12.0–15.0)
Lymphocytes Relative: 20 %
Lymphs Abs: 1.2 10*3/uL (ref 0.7–4.0)
MCH: 30.5 pg (ref 26.0–34.0)
MCHC: 32.8 g/dL (ref 30.0–36.0)
MCV: 93.1 fL (ref 78.0–100.0)
Monocytes Absolute: 0.5 10*3/uL (ref 0.1–1.0)
Monocytes Relative: 9 %
Neutro Abs: 3.7 10*3/uL (ref 1.7–7.7)
Neutrophils Relative %: 64 %
Platelets: 222 10*3/uL (ref 150–400)
RBC: 3.93 MIL/uL (ref 3.87–5.11)
RDW: 13 % (ref 11.5–15.5)
WBC: 5.8 10*3/uL (ref 4.0–10.5)

## 2015-08-03 MED ORDER — SODIUM CHLORIDE 0.9 % IV BOLUS (SEPSIS): 500.0000 mL | Freq: Once | INTRAVENOUS | Status: DC

## 2015-08-03 NOTE — ED Notes (Signed)
Pt states she had surgery on back on May 22nd and 2-3 days ago incision started hurting

## 2015-08-03 NOTE — ED Notes (Addendum)
C/o pain to incision on back from approx 1 month ago  Has not followed up w surgeon about this pain, small amount of drainage at base of incision ,  Appears to be healing incision

## 2015-08-03 NOTE — ED Notes (Signed)
Pt now states she has had diarrhea x 1 day

## 2015-08-03 NOTE — ED Notes (Signed)
Per md pt may take home pain meds

## 2015-08-03 NOTE — Discharge Instructions (Signed)
Follow up closely with your neurosurgeon as soon as possible.  Return here as needed

## 2015-08-03 NOTE — ED Provider Notes (Signed)
CSN: [id number]     Arrival date & time 08/03/15  1911 History   First MD Initiated Contact with Patient 08/03/15 2016     Chief Complaint  Patient presents with  . Incision check      (Consider location/radiation/quality/duration/timing/severity/associated sxs/prior Treatment) HPI Patient presents to the emergency department with a possible infection of her incision site from her lumbar surgery.  The patient states that her wound has been healing well, but noticed yesterday that there is some mild swelling at the base of the incision site and her husband noted that there was some drainage from this area.  Today she states there was some mild increased warmth.  Patient states that nothing seems make the condition better or worse.  She states that there is no increased pain in that area. The patient denies chest pain, shortness of breath, headache,blurred vision, neck pain, fever, cough, weakness, numbness, dizziness, anorexia, edema, abdominal pain, nausea, vomiting, diarrhea, rash, back pain, dysuria, hematemesis, bloody stool, near syncope, or syncope. Past Medical History  Diagnosis Date  . Family history of anesthesia complication     Nausea  . Respiratory failure ([place]) 11/2010; 09/2011    Resolved  - History pneumonia  . Chronic back pain   . Asthma     "very badly" (01/17/2012)  . Chronic bronchitis ([place])     "@ least once/yr" (01/17/2012) - none since 01/2012  . Exertional dyspnea     Asthma  . GERD (gastroesophageal reflux disease)   . Degenerative disk disease   . Cervical cancer ([place]) 1999; 2000  . Complication of anesthesia     back surgeries x 6 - fusions T8-S2- rods  . Difficult intubation     "needs small tube"  . PONV (postoperative nausea and vomiting)   . CHF (congestive heart failure) ([place]) 2006    Resolved -History"with 2006 pregnancy, toxcemia "  not seeing a cardiologist now.  . Pneumonia     "~ once/yr" (01/17/2012) none since 01/2012  . Anxiety     no meds  .  Migraines     last migraine 09/2013  . Neuromuscular disorder ([facility])     sciatica  - affecting left leg numb/pain   Past Surgical History  Procedure Laterality Date  . Spinal fusion  10/04/2011    Procedure: FUSION POSTERIOR SPINAL MULTILEVEL;  Surgeon: Lowella Grip, MD;  Location: [facility];  Service: Orthopedics;  Laterality: Bilateral;  repair lumbar pseudoarthritis, revision segmental fixation, L3-L4, L4-L5  . Cervical cone biopsy  1999; 2000    cervical cancer  . Adenoidectomy, tonsillectomy and myringotomy with tube placement  1983?  Marland Kitchen Tonsillectomy    . Knee arthroscopy  2004 X 2    "left X 2" (01/17/2012)  . Lumbar disc surgery  06/1998    "L4-5" (01/17/2012)  . Lumbar fusion  08/1998; 2010; 2012; 06/2011, 02/19/14    "fusion cage; put rods in; reanchored screws; rod broke" (01/17/2012); Had son in 10/2013 and rods in back broke.   Marland Kitchen Anterior lumbar fusion  01/17/2012    Procedure: ANTERIOR LUMBAR FUSION 2 LEVELS;  Surgeon: Lowella Grip, MD;  Location: [facility];  Service: Orthopedics;  Laterality: Bilateral;  Anterior Lumbar 3-4, Lumbar 4-5 fusion with bone graft harvest  . Anterior lumbar fusion    . Cesarean section N/A 11/08/2013    Procedure: REPEAT CESAREAN SECTION;  Surgeon: Shelly Bombard, MD;  Location: [facility];  Service: Obstetrics;  Laterality: N/A;  . Back surgery  02/19/2014  Family History  Problem Relation Age of Onset  . Heart disease Father   . Hypertension Father   . Diabetes Father     ESRD on dialysis  . Deep vein thrombosis Father   . Cancer    . COPD Mother   . Heart attack Sister    Social History  Substance Use Topics  . Smoking status: Current Every Day Smoker -- 0.50 packs/day for 15 years    Types: Cigarettes    Last Attempt to Quit: 04/14/2013  . Smokeless tobacco: Never Used  . Alcohol Use: No   OB History    Gravida Para Term Preterm AB TAB SAB Ectopic Multiple Living   3 2 2  1  1   2      Review of Systems All other systems negative  except as documented in the HPI. All pertinent positives and negatives as reviewed in the HPI.   Allergies  Ultram and Other  Home Medications   Prior to Admission medications   Medication Sig Start Date End Date Taking? Authorizing Provider  azithromycin (ZITHROMAX Z-PAK) 250 MG tablet As directed 03/06/15   Rosalita Chessman Chase, DO  baclofen (LIORESAL) 10 MG tablet  02/24/14   Historical Provider, MD  budesonide-formoterol (SYMBICORT) 160-4.5 MCG/ACT inhaler Inhale 2 puffs into the lungs 2 (two) times daily. 03/06/15   Rosalita Chessman Chase, DO  diazepam (VALIUM) 10 MG tablet Take 10 mg by mouth. 03/27/14   Historical Provider, MD  ergocalciferol (VITAMIN D2) 50000 units capsule Take 1 capsule (50,000 Units total) by mouth once a week. 03/13/15   Debbrah Alar, NP  esomeprazole (NEXIUM) 40 MG capsule TAKE 1 CAPSULE BY MOUTH DAILY BEFORE BREAKFAST. 09/02/14   Shelly Bombard, MD  levocetirizine (XYZAL) 5 MG tablet Take 1 tablet (5 mg total) by mouth every evening. 03/06/15   Alferd Apa Lowne Chase, DO  ondansetron (ZOFRAN) 4 MG tablet Take 1 tablet (4 mg total) by mouth every 8 (eight) hours as needed for nausea or vomiting. Patient not taking: Reported on 02/14/2015 11/05/14   Debbrah Alar, NP  predniSONE (DELTASONE) 10 MG tablet 3 po qd for 3 days then 2 po qd for 3 days the 1 po qd for 3 days 03/06/15   Rosalita Chessman Chase, DO  rizatriptan (MAXALT) 5 MG tablet TAKE 1 TABLET BY MOUTH AT ONSET OF MIGRAINE, MAY REPEAT 2 HOURS LATER AS NEEDED. MAX 2 TABLETS PER DAY. Patient not taking: Reported on 02/14/2015 12/12/14   Debbrah Alar, NP  varenicline (CHANTIX CONTINUING MONTH PAK) 1 MG tablet Take 1 tablet (1 mg total) by mouth 2 (two) times daily. 01/18/15   Debbrah Alar, NP  VENTOLIN HFA 108 (90 Base) MCG/ACT inhaler INHALE 2 PUFFS INTO THE LUNGS EVERY 6 HOURS AS NEEDED FOR WHEEZING OR SHORTNESS OF BREATH. 07/15/15   Debbrah Alar, NP   BP 109/78 mmHg  Pulse 87  Temp(Src)  98.3 F (36.8 C) (Oral)  Resp 18  Ht 5\' 3"  (1.6 m)  Wt 87.998 kg  BMI 34.37 kg/m2  SpO2 97% Physical Exam  Constitutional: She is oriented to person, place, and time. She appears well-developed and well-nourished. No distress.  HENT:  Head: Normocephalic and atraumatic.  Mouth/Throat: Oropharynx is clear and moist.  Eyes: Pupils are equal, round, and reactive to light.  Neck: Normal range of motion. Neck supple.  Cardiovascular: Normal rate, regular rhythm and normal heart sounds.  Exam reveals no gallop and no friction rub.   No murmur  heard. Pulmonary/Chest: Effort normal and breath sounds normal. No respiratory distress.  Musculoskeletal:       Back:  Neurological: She is alert and oriented to person, place, and time. She has normal strength. She displays normal reflexes. No sensory deficit. She exhibits normal muscle tone. Coordination and gait normal. GCS eye subscore is 4. GCS verbal subscore is 5. GCS motor subscore is 6.  Skin: Skin is warm and dry.  Psychiatric: She has a normal mood and affect.    ED Course  Procedures (including critical care time) Labs Review Labs Reviewed  AEROBIC CULTURE (SUPERFICIAL SPECIMEN)  BASIC METABOLIC PANEL  CBC WITH DIFFERENTIAL/PLATELET    Imaging Review No results found. I have personally reviewed and evaluated these images and lab results as part of my medical decision-making.     Patient has no neurological deficits.  There is no signs of significant infection at this time.  The patient will need to follow-up with her neurosurgeon for further evaluation.  Her white count was normal.  She does not have any vital sign abnormalities or fever.  Patient is advised to return here as needed  Dalia Heading, PA-C 08/05/15 0123  Leo Grosser, MD 08/05/15 [phone]

## 2015-08-04 ENCOUNTER — Telehealth: Payer: Self-pay | Admitting: Family

## 2015-08-04 NOTE — Telephone Encounter (Signed)
No charge. 

## 2015-08-04 NOTE — Telephone Encounter (Signed)
Patient was No Show 6/19. First No Show  Charge or No Charge?

## 2015-08-05 ENCOUNTER — Encounter: Payer: Self-pay | Admitting: Family

## 2015-08-06 LAB — AEROBIC CULTURE W GRAM STAIN (SUPERFICIAL SPECIMEN)

## 2015-08-06 LAB — AEROBIC CULTURE  (SUPERFICIAL SPECIMEN)

## 2015-08-07 ENCOUNTER — Telehealth: Payer: Self-pay | Admitting: *Deleted

## 2015-08-07 NOTE — ED Notes (Signed)
Post ED Visit - Positive Culture Follow-up: Unsuccessful Patient Follow-up  Culture assessed and recommendations reviewed by: []  Elenor Quinones, Pharm.D. []  Heide Guile, Pharm.D., BCPS []  Parks Neptune, Pharm.D. []  Alycia Rossetti, Pharm.D., BCPS []  [place], Pharm.D., BCPS, AAHIVP []  Legrand Como, Pharm.D., BCPS, AAHIVP []  Milus Glazier, Pharm.D. []  Stephens November, [place].D.  Positive wound culture  [x]  Patient discharged without antimicrobial prescription and treatment is now indicated []  Organism is resistant to prescribed ED discharge antimicrobial []  Patient with positive blood cultures   Unable to contact patient after 3 attempts, letter will be sent to address on file  Ardeen Fillers 08/07/2015, 10:48 AM

## 2015-08-07 NOTE — Progress Notes (Signed)
ED Antimicrobial Stewardship Positive Culture Follow Up   [name] is an 35 y.o. female who presented to [facility] on 08/03/2015 with a chief complaint of  Chief Complaint  Patient presents with  . Incision check     Recent Results (from the past 720 hour(s))  Wound or Superficial Culture     Status: None   Collection Time: 08/03/15  9:45 PM  Result Value Ref Range Status   Specimen Description INCISION  Final   Special Requests NONE  Final   Gram Stain   Final    MODERATE WBC PRESENT,BOTH PMN AND MONONUCLEAR MODERATE GRAM POSITIVE COCCI IN CLUSTERS    Culture ABUNDANT STAPHYLOCOCCUS AUREUS  Final   Report Status 08/06/2015 FINAL  Final   Organism ID, Bacteria STAPHYLOCOCCUS AUREUS  Final      Susceptibility   Staphylococcus aureus - MIC*    CIPROFLOXACIN >=8 RESISTANT Resistant     ERYTHROMYCIN <=0.25 SENSITIVE Sensitive     GENTAMICIN 8 INTERMEDIATE Intermediate     OXACILLIN 0.5 SENSITIVE Sensitive     TETRACYCLINE <=1 SENSITIVE Sensitive     VANCOMYCIN 1 SENSITIVE Sensitive     TRIMETH/SULFA <=10 SENSITIVE Sensitive     CLINDAMYCIN <=0.25 SENSITIVE Sensitive     RIFAMPIN <=0.5 SENSITIVE Sensitive     Inducible Clindamycin NEGATIVE Sensitive     * ABUNDANT STAPHYLOCOCCUS AUREUS     [x]  Patient discharged originally without antimicrobial agent and treatment is now indicated  New antibiotic prescription: Bactrim 1 DS tablet PO BID x 14 days. Follow up with PCP.  ED Provider: Gay Filler, PA-C  [name], PharmD PGY2 Infectious Diseases Pharmacy Resident Pager: [phone] 08/07/2015 10:22 AM

## 2015-08-11 DIAGNOSIS — M25551 Pain in right hip: Secondary | ICD-10-CM | POA: Insufficient documentation

## 2015-08-12 ENCOUNTER — Ambulatory Visit: Payer: Medicare Other | Admitting: Family

## 2015-08-24 ENCOUNTER — Ambulatory Visit (HOSPITAL_BASED_OUTPATIENT_CLINIC_OR_DEPARTMENT_OTHER): Payer: Medicare Other

## 2015-08-24 ENCOUNTER — Ambulatory Visit: Payer: Medicare Other | Admitting: Family

## 2015-08-27 ENCOUNTER — Ambulatory Visit: Payer: Medicare Other | Admitting: Family

## 2015-08-27 ENCOUNTER — Ambulatory Visit (HOSPITAL_BASED_OUTPATIENT_CLINIC_OR_DEPARTMENT_OTHER): Payer: Medicare Other

## 2015-09-07 ENCOUNTER — Ambulatory Visit (INDEPENDENT_AMBULATORY_CARE_PROVIDER_SITE_OTHER): Payer: Medicare Other | Admitting: Family

## 2015-09-07 ENCOUNTER — Encounter: Payer: Self-pay | Admitting: Family

## 2015-09-07 ENCOUNTER — Ambulatory Visit (HOSPITAL_BASED_OUTPATIENT_CLINIC_OR_DEPARTMENT_OTHER)
Admission: RE | Admit: 2015-09-07 | Discharge: 2015-09-07 | Disposition: A | Discharge status: Home / Self Care | Payer: Medicare Other | Source: Ambulatory Visit | Attending: Family | Admitting: Family

## 2015-09-07 ENCOUNTER — Other Ambulatory Visit (HOSPITAL_COMMUNITY)
Admission: RE | Admit: 2015-09-07 | Discharge: 2015-09-07 | Disposition: A | Discharge status: Home / Self Care | Payer: Medicare Other | Source: Ambulatory Visit | Attending: Family | Admitting: Family

## 2015-09-07 ENCOUNTER — Other Ambulatory Visit: Payer: Self-pay | Admitting: Family

## 2015-09-07 VITALS — BP 82/60 | HR 88 | Temp 97.9°F | Ht 63.0 in | Wt 191.2 lb

## 2015-09-07 DIAGNOSIS — M25462 Effusion, left knee: Secondary | ICD-10-CM

## 2015-09-07 DIAGNOSIS — N760 Acute vaginitis: Secondary | ICD-10-CM | POA: Insufficient documentation

## 2015-09-07 DIAGNOSIS — M545 Low back pain: Secondary | ICD-10-CM | POA: Diagnosis not present

## 2015-09-07 DIAGNOSIS — R300 Dysuria: Secondary | ICD-10-CM

## 2015-09-07 DIAGNOSIS — Z1231 Encounter for screening mammogram for malignant neoplasm of breast: Secondary | ICD-10-CM | POA: Insufficient documentation

## 2015-09-07 DIAGNOSIS — Z0000 Encounter for general adult medical examination without abnormal findings: Secondary | ICD-10-CM | POA: Diagnosis not present

## 2015-09-07 DIAGNOSIS — F329 Major depressive disorder, single episode, unspecified: Secondary | ICD-10-CM

## 2015-09-07 DIAGNOSIS — G8929 Other chronic pain: Secondary | ICD-10-CM

## 2015-09-07 DIAGNOSIS — J4540 Moderate persistent asthma, uncomplicated: Secondary | ICD-10-CM

## 2015-09-07 DIAGNOSIS — Z113 Encounter for screening for infections with a predominantly sexual mode of transmission: Secondary | ICD-10-CM | POA: Insufficient documentation

## 2015-09-07 DIAGNOSIS — F32A Depression, unspecified: Secondary | ICD-10-CM

## 2015-09-07 DIAGNOSIS — N898 Other specified noninflammatory disorders of vagina: Secondary | ICD-10-CM

## 2015-09-07 LAB — POCT URINALYSIS DIPSTICK
Bilirubin, UA: NEGATIVE
Glucose, UA: NEGATIVE
KETONES UA: NEGATIVE
Leukocytes, UA: NEGATIVE
Nitrite, UA: NEGATIVE
PH UA: 6
PROTEIN UA: NEGATIVE
RBC UA: NEGATIVE
Urobilinogen, UA: NEGATIVE

## 2015-09-07 LAB — HM MAMMOGRAPHY

## 2015-09-07 MED ORDER — BUPROPION HCL ER (SR) 150 MG PO TB12: ORAL_TABLET | ORAL | 1 refills | Status: DC

## 2015-09-07 MED ORDER — MELOXICAM 7.5 MG PO TABS: 7.5000 mg | ORAL_TABLET | Freq: Every day | ORAL | 0 refills | Status: DC

## 2015-09-07 NOTE — Patient Instructions (Addendum)
Please begin meloxicam (anti-inflammatory) for your knee swelling. Let me know if you decide you would like a referral to orthopedics. Increase your symbicort to 2 puffs twice daily. Begin wellbutrin for depression- 150 mg daily in the morning; if tolerated, after 3 days, may increase to a target dose of 150 mg twice daily

## 2015-09-07 NOTE — Assessment & Plan Note (Signed)
Uncontrolled. Advised pt to increase the symbicort to 2 puffs bid, (currently only taking once daily). Continue prn albuterol.

## 2015-09-07 NOTE — Assessment & Plan Note (Signed)
She is requesting referral to pain clinic at [facility].

## 2015-09-07 NOTE — Assessment & Plan Note (Signed)
Will rx with meloxicam. Pt declines referral to ortho at this time.

## 2015-09-07 NOTE — Progress Notes (Signed)
Subjective:    Patient ID: [name], female    DOB: Dec 30, 1980, 35 y.o.   MRN: [id number]  HPI  [name] is a 35 yr old female who presents today for follow up.  1) Asthma- She is using symbicort 2 puffs in the AM only.  She reports that she is reaching for her rescue inhaler 3-4 times a week, but then does not feel that is helping.    2) Depression- Reports mood is "not so great.  Some days she feels withdrawn.  Reports that she is sleeping more than she should.  Reports poor energy. Concentration is poor.  She reports that she tried effexor in the past but it made her feel that she was in a "tunnel."  Reports she has tried zoloft in the past.  Denies SI/HI.  3) Left knee swelling -She reports remote hx of left knee arthroscopy (2005).     Review of Systems See HPI  Past Medical History:  Diagnosis Date  . Anxiety    no meds  . Asthma    "very badly" (01/17/2012)  . Cervical cancer ([facility]) 1999; 2000  . CHF (congestive heart failure) ([facility]) 2006   Resolved -History"with 2006 pregnancy, toxcemia "  not seeing a cardiologist now.  . Chronic back pain   . Chronic bronchitis ([facility])    "@ least once/yr" (01/17/2012) - none since 01/2012  . Complication of anesthesia    back surgeries x 6 - fusions T8-S2- rods  . Degenerative disk disease   . Difficult intubation    "needs small tube"  . Exertional dyspnea    Asthma  . Family history of anesthesia complication    Nausea  . GERD (gastroesophageal reflux disease)   . Migraines    last migraine 09/2013  . Neuromuscular disorder (HCC)    sciatica  - affecting left leg numb/pain  . Pneumonia    "~ once/yr" (01/17/2012) none since 01/2012  . PONV (postoperative nausea and vomiting)   . Respiratory failure ([facility]) 11/2010; 09/2011   Resolved  - History pneumonia     Social History   Social History  . Marital status: Married    Spouse name: N/A  . Number of children: 1  . Years of education: N/A   Occupational  History  . Disabled    Social History Main Topics  . Smoking status: Former Smoker    Packs/day: 0.50    Years: 15.00    Types: Cigarettes    Quit date: 04/14/2013  . Smokeless tobacco: Never Used  . Alcohol use No  . Drug use: No     Comment: last smoked today March 2015  . Sexual activity: Yes    Partners: Male    Birth control/ protection: IUD     Comment: Last Encounter 02-09-14   Other Topics Concern  . Not on file   Social History Narrative   Married, 2 children   disabled    Past Surgical History:  Procedure Laterality Date  . ADENOIDECTOMY, TONSILLECTOMY AND MYRINGOTOMY WITH TUBE PLACEMENT  1983?  . ANTERIOR LUMBAR FUSION  01/17/2012   Procedure: ANTERIOR LUMBAR FUSION 2 LEVELS;  Surgeon: Lowella Grip, MD;  Location: [facility];  Service: Orthopedics;  Laterality: Bilateral;  Anterior Lumbar 3-4, Lumbar 4-5 fusion with bone graft harvest  . ANTERIOR LUMBAR FUSION    . BACK SURGERY  02/19/2014  . CERVICAL CONE BIOPSY  1999; 2000   cervical cancer  . CESAREAN SECTION N/A 11/08/2013  Procedure: REPEAT CESAREAN SECTION;  Surgeon: Shelly Bombard, MD;  Location: [facility];  Service: Obstetrics;  Laterality: N/A;  . KNEE ARTHROSCOPY  2004 X 2   "left X 2" (01/17/2012)  . LUMBAR DISC SURGERY  06/1998   "L4-5" (01/17/2012)  . LUMBAR FUSION  08/1998; 2010; 2012; 06/2011, 02/19/14   "fusion cage; put rods in; reanchored screws; rod broke" (01/17/2012); Had son in 10/2013 and rods in back broke.   Marland Kitchen SPINAL FUSION  10/04/2011   Procedure: FUSION POSTERIOR SPINAL MULTILEVEL;  Surgeon: Lowella Grip, MD;  Location: [facility];  Service: Orthopedics;  Laterality: Bilateral;  repair lumbar pseudoarthritis, revision segmental fixation, L3-L4, L4-L5  . SPINAL FUSION  07/06/2015   T7 - S2  . TONSILLECTOMY      Family History  Problem Relation Age of Onset  . Heart disease Father   . Hypertension Father   . Diabetes Father     ESRD on dialysis  . Deep vein thrombosis Father   .  Cancer    . COPD Mother   . Heart attack Sister     Allergies  Allergen Reactions  . Iodine Itching and Rash  . Ultram [Tramadol] Itching  . Other Rash    Compound Tincture of Benzoin   . Tape Rash    Current Outpatient Prescriptions on File Prior to Visit  Medication Sig Dispense Refill  . baclofen (LIORESAL) 10 MG tablet     . budesonide-formoterol (SYMBICORT) 160-4.5 MCG/ACT inhaler Inhale 2 puffs into the lungs 2 (two) times daily. 1 Inhaler 12  . diazepam (VALIUM) 10 MG tablet Take 10 mg by mouth.    . ergocalciferol (VITAMIN D2) 50000 units capsule Take 1 capsule (50,000 Units total) by mouth once a week. 12 capsule 0  . esomeprazole (NEXIUM) 40 MG capsule TAKE 1 CAPSULE BY MOUTH DAILY BEFORE BREAKFAST. 30 capsule 11  . levocetirizine (XYZAL) 5 MG tablet Take 1 tablet (5 mg total) by mouth every evening. 30 tablet 5  . ondansetron (ZOFRAN) 4 MG tablet Take 1 tablet (4 mg total) by mouth every 8 (eight) hours as needed for nausea or vomiting. 20 tablet 0  . rizatriptan (MAXALT) 5 MG tablet TAKE 1 TABLET BY MOUTH AT ONSET OF MIGRAINE, MAY REPEAT 2 HOURS LATER AS NEEDED. MAX 2 TABLETS PER DAY. 10 tablet 1  . VENTOLIN HFA 108 (90 Base) MCG/ACT inhaler INHALE 2 PUFFS INTO THE LUNGS EVERY 6 HOURS AS NEEDED FOR WHEEZING OR SHORTNESS OF BREATH. 18 g 0   No current facility-administered medications on file prior to visit.     BP (!) 82/60   Pulse 88   Temp 97.9 F (36.6 C) (Oral)   Ht 5\' 3"  (1.6 m)   Wt 191 lb 3.2 oz (86.7 kg)   SpO2 98% Comment: room air  BMI 33.87 kg/m       Objective:   Physical Exam  Constitutional: She is oriented to person, place, and time. She appears well-developed and well-nourished.  Cardiovascular: Normal rate, regular rhythm and normal heart sounds.   No murmur heard. Pulmonary/Chest: Effort normal and breath sounds normal. No respiratory distress. She has no wheezes.  Musculoskeletal:  + loud crepitus left knee, mild lateral swelling of left  knee  Neurological: She is alert and oriented to person, place, and time.  Psychiatric: Her behavior is normal. Judgment and thought content normal. She is not slowed.  Affect is flat          Assessment & Plan:  Dysuria- UA unremarkable. Will send urine for culture.  Will also send urine for ancillary testing for BV/trich/GC/Chlamydia/yeast

## 2015-09-07 NOTE — Assessment & Plan Note (Signed)
Uncontrolled. Will give trial of Wellbutrin.

## 2015-09-08 ENCOUNTER — Other Ambulatory Visit: Payer: Self-pay | Admitting: Obstetrics

## 2015-09-09 LAB — URINE CYTOLOGY ANCILLARY ONLY
Chlamydia: NEGATIVE
NEISSERIA GONORRHEA: NEGATIVE
TRICH (WINDOWPATH): NEGATIVE

## 2015-09-09 LAB — URINE CULTURE: Organism ID, Bacteria: NO GROWTH

## 2015-09-11 LAB — URINE CYTOLOGY ANCILLARY ONLY
BACTERIAL VAGINITIS: NEGATIVE
CANDIDA VAGINITIS: NEGATIVE

## 2015-09-25 ENCOUNTER — Other Ambulatory Visit: Payer: Self-pay | Admitting: *Deleted

## 2015-09-25 MED ORDER — ALBUTEROL SULFATE HFA 108 (90 BASE) MCG/ACT IN AERS: INHALATION_SPRAY | RESPIRATORY_TRACT | 1 refills | Status: DC

## 2015-09-25 NOTE — Telephone Encounter (Signed)
Received faxes from [facility] requesting refills on Meloxicam and Proair HFA. Pt is due for follow up in September. Proair refill sent. Please advise meloxicam request.

## 2015-09-26 MED ORDER — MELOXICAM 7.5 MG PO TABS: 7.5000 mg | ORAL_TABLET | Freq: Every day | ORAL | 0 refills | Status: DC

## 2015-10-19 ENCOUNTER — Telehealth (HOSPITAL_BASED_OUTPATIENT_CLINIC_OR_DEPARTMENT_OTHER): Payer: Self-pay | Admitting: *Deleted

## 2015-10-19 NOTE — Telephone Encounter (Signed)
Post ED Visit - Positive Culture Follow-up: Unsuccessful Patient Follow-up  Culture assessed and recommendations reviewed by: []  Elenor Quinones, Pharm.D. []  Heide Guile, Pharm.D., BCPS []  Parks Neptune, Pharm.D. []  Alycia Rossetti, Pharm.D., BCPS []  [place], Pharm.D., BCPS, AAHIVP []  Legrand Como, Pharm.D., BCPS, AAHIVP []  Milus Glazier, Pharm.D. []  Rob Ganado, Pharm.D.  Positive wound culture  [x]  Patient discharged without antimicrobial prescription and treatment is now indicated []  Organism is resistant to prescribed ED discharge antimicrobial []  Patient with positive blood cultures   Unable to contact patient after 3 attempts, letter sent to address on file with no response.  No further treatment received.  [name] [facility] 10/19/2015, 11:55 AM

## 2015-10-20 ENCOUNTER — Ambulatory Visit: Payer: Medicare Other | Admitting: Family

## 2015-10-26 ENCOUNTER — Telehealth: Payer: Self-pay | Admitting: Family

## 2015-10-26 ENCOUNTER — Ambulatory Visit: Payer: Medicare Other | Admitting: Family

## 2015-10-26 ENCOUNTER — Ambulatory Visit (INDEPENDENT_AMBULATORY_CARE_PROVIDER_SITE_OTHER): Payer: Medicare Other | Admitting: Family

## 2015-10-26 ENCOUNTER — Encounter: Payer: Self-pay | Admitting: Family

## 2015-10-26 VITALS — BP 113/71 | HR 84 | Temp 98.2°F | Resp 24 | Ht 63.0 in | Wt 189.8 lb

## 2015-10-26 DIAGNOSIS — M545 Low back pain: Secondary | ICD-10-CM

## 2015-10-26 DIAGNOSIS — G8929 Other chronic pain: Secondary | ICD-10-CM | POA: Diagnosis not present

## 2015-10-26 DIAGNOSIS — F329 Major depressive disorder, single episode, unspecified: Secondary | ICD-10-CM | POA: Diagnosis not present

## 2015-10-26 DIAGNOSIS — M25562 Pain in left knee: Secondary | ICD-10-CM

## 2015-10-26 DIAGNOSIS — J4540 Moderate persistent asthma, uncomplicated: Secondary | ICD-10-CM

## 2015-10-26 DIAGNOSIS — Z23 Encounter for immunization: Secondary | ICD-10-CM | POA: Diagnosis not present

## 2015-10-26 DIAGNOSIS — F32A Depression, unspecified: Secondary | ICD-10-CM

## 2015-10-26 MED ORDER — PROAIR HFA 108 (90 BASE) MCG/ACT IN AERS: 2.0000 | INHALATION_SPRAY | Freq: Four times a day (QID) | RESPIRATORY_TRACT | 2 refills | Status: DC | PRN

## 2015-10-26 MED ORDER — ONDANSETRON HCL 4 MG PO TABS: 4.0000 mg | ORAL_TABLET | Freq: Three times a day (TID) | ORAL | 0 refills | Status: DC | PRN

## 2015-10-26 MED ORDER — PROMETHAZINE HCL 50 MG PO TABS: 50.0000 mg | ORAL_TABLET | Freq: Four times a day (QID) | ORAL | 0 refills | Status: DC | PRN

## 2015-10-26 MED ORDER — DESVENLAFAXINE SUCCINATE ER 50 MG PO TB24: 50.0000 mg | ORAL_TABLET | Freq: Every day | ORAL | 3 refills | Status: DC

## 2015-10-26 NOTE — Telephone Encounter (Signed)
Patient Name: [name]  DOB: 1980-12-25    Initial Comment Caller her legs went out, numb. and she fell. She hit her head.    Nurse Assessment  Nurse: Andria Frames, RN, Aeriel Date/Time (Eastern Time): 10/26/2015 9:21:58 AM  Confirm and document reason for call. If symptomatic, describe symptoms. You must click the next button to save text entered. ---Caller states, right leg went out on her as soon as she stood up. She took 3 steps and went down and couldn't prevent herself from falling. She hit her head. It was more like a rug burn.  Has the patient traveled out of the country within the last 30 days? ---Not Applicable  Does the patient have any new or worsening symptoms? ---Yes  Will a triage be completed? ---Yes  Related visit to physician within the last 2 weeks? ---No  Does the PT have any chronic conditions? (i.e. diabetes, asthma, etc.) ---No  Is the patient pregnant or possibly pregnant? (Ask all females between the ages of 65-55) ---No  Is this a behavioral health or substance abuse call? ---No     Guidelines    Guideline Title Affirmed Question Affirmed Notes  Neurologic Deficit [1] Weakness (i.e., paralysis, loss of muscle strength) of the face, arm / hand, or leg / foot on one side of the body AND [2] sudden onset AND [3] present now    Final Disposition User   Call EMS 911 Now Hensel, RN, Aeriel    Comments  Caller states, she will call her brother instead of calling 911. She would like an appt instead of going to the ED.  Called office and spoke to [place] and gave report that pt woke up this am and can not stand on her right leg. She is falling whenever she tries to stand up. Triage outcome of call 911 but she does not want to call 911 or go to ER but wants appt.   Disagree/Comply: Disagree  Disagree/Comply Reason: Disagree with instructions

## 2015-10-26 NOTE — Assessment & Plan Note (Addendum)
Uncontrolled, give trial of pristiq, advised pt to schedule apt with psychiatry.

## 2015-10-26 NOTE — Patient Instructions (Addendum)
Begin pristiq for depression.   For nausea related to withdrawal and restlesness, you may use promethazine every 6 hours as needed. If you develop diarrhea you may use imodium as needed. Please keep your upcoming appointment with pain management. Schedule an appointment with psychiatry. You will be contacted about your referral to orthopedics for your left knee pain.   Psychiatric Services:  [facility], [place] [address], [place], [address] [place], [place] [facility] [phone] [place], [place], [facility], [address], [place], [place]

## 2015-10-26 NOTE — Progress Notes (Signed)
Pre visit review using our clinic review tool, if applicable. No additional management support is needed unless otherwise documented below in the visit note. 

## 2015-10-26 NOTE — Assessment & Plan Note (Signed)
Stable, patient requests refill of proair. Rx sent.

## 2015-10-26 NOTE — Progress Notes (Signed)
Subjective:    Patient ID: [name], female    DOB: 1980-06-16, 35 y.o.   MRN: [id number]  HPI  [name] is a 35 yr old female who presents today for follow up.  1) Depression-  Was previously on wellbutrin, felt like this worsened her depression. She discontinued her medication. She reports that she has been on effexor, adderall, cymbalta, zoloft, paxil.  She reports that she believes she had itching with zoloft.  Reports that she 'felt like I was in a tunnel on cymbalta."  Feels down, unmotivated, denies SI.   2) Asthma-  Reports asthma has been stable.  She reports proair works better for her than proventil.    3) L knee swelling- no significant improvement with meloxicam. Has some "popping/grinding"   4) Fall-  Pt reports that she stood up to get out of bed. R leg was numb (more numb than normal). She did scrape her forehead across the carpet but denies severe head trauma. Back pain is at baselines.    Reports "body hurts, nausea, mild HA, feels cold."    Reviewed most recent note with Pain management. She saw Dr. Marshell Levan on 10/15/15.  In an attempt to wean her opiods her oxycodone was changed from 5mg  5x daily to metadone 5mg  TID.  She was given a 1 week supply and instructed to follow up with comprehab for a second opinion. She was also started on topamax at that time. She ran out of pain medication 2 days ago.  She did not fill methadone as she does not feel comfortable taking methadone. She reports some withdrawal sxs today and is requesting medication for symptom withdrawal.    Review of Systems    see HPI  Past Medical History:  Diagnosis Date  . Anxiety    no meds  . Asthma    "very badly" (01/17/2012)  . Cervical cancer ([facility]) 1999; 2000  . CHF (congestive heart failure) ([facility]) 2006   Resolved -History"with 2006 pregnancy, toxcemia "  not seeing a cardiologist now.  . Chronic back pain   . Chronic bronchitis ([facility])    "@ least once/yr" (01/17/2012) - none  since 01/2012  . Complication of anesthesia    back surgeries x 6 - fusions T8-S2- rods  . Degenerative disk disease   . Difficult intubation    "needs small tube"  . Exertional dyspnea    Asthma  . Family history of anesthesia complication    Nausea  . GERD (gastroesophageal reflux disease)   . Migraines    last migraine 09/2013  . Neuromuscular disorder (HCC)    sciatica  - affecting left leg numb/pain  . Pneumonia    "~ once/yr" (01/17/2012) none since 01/2012  . PONV (postoperative nausea and vomiting)   . Respiratory failure ([facility]) 11/2010; 09/2011   Resolved  - History pneumonia     Social History   Social History  . Marital status: Married    Spouse name: N/A  . Number of children: 1  . Years of education: N/A   Occupational History  . Disabled    Social History Main Topics  . Smoking status: Former Smoker    Packs/day: 0.50    Years: 15.00    Types: Cigarettes    Quit date: 04/14/2013  . Smokeless tobacco: Never Used  . Alcohol use No  . Drug use: No     Comment: last smoked today March 2015  . Sexual activity: Yes    Partners: Male  Birth control/ protection: IUD     Comment: Last Encounter 02-09-14   Other Topics Concern  . Not on file   Social History Narrative   Married, 2 children   disabled    Past Surgical History:  Procedure Laterality Date  . ADENOIDECTOMY, TONSILLECTOMY AND MYRINGOTOMY WITH TUBE PLACEMENT  1983?  . ANTERIOR LUMBAR FUSION  01/17/2012   Procedure: ANTERIOR LUMBAR FUSION 2 LEVELS;  Surgeon: Lowella Grip, MD;  Location: [facility];  Service: Orthopedics;  Laterality: Bilateral;  Anterior Lumbar 3-4, Lumbar 4-5 fusion with bone graft harvest  . ANTERIOR LUMBAR FUSION    . BACK SURGERY  02/19/2014  . CERVICAL CONE BIOPSY  1999; 2000   cervical cancer  . CESAREAN SECTION N/A 11/08/2013   Procedure: REPEAT CESAREAN SECTION;  Surgeon: Shelly Bombard, MD;  Location: [facility];  Service: Obstetrics;  Laterality: N/A;  . KNEE  ARTHROSCOPY  2004 X 2   "left X 2" (01/17/2012)  . LUMBAR DISC SURGERY  06/1998   "L4-5" (01/17/2012)  . LUMBAR FUSION  08/1998; 2010; 2012; 06/2011, 02/19/14   "fusion cage; put rods in; reanchored screws; rod broke" (01/17/2012); Had son in 10/2013 and rods in back broke.   Marland Kitchen SPINAL FUSION  10/04/2011   Procedure: FUSION POSTERIOR SPINAL MULTILEVEL;  Surgeon: Lowella Grip, MD;  Location: [facility];  Service: Orthopedics;  Laterality: Bilateral;  repair lumbar pseudoarthritis, revision segmental fixation, L3-L4, L4-L5  . SPINAL FUSION  07/06/2015   T7 - S2  . TONSILLECTOMY      Family History  Problem Relation Age of Onset  . Heart disease Father   . Hypertension Father   . Diabetes Father     ESRD on dialysis  . Deep vein thrombosis Father   . Cancer    . COPD Mother   . Heart attack Sister     Allergies  Allergen Reactions  . Iodine Itching and Rash  . Ultram [Tramadol] Itching  . Other Rash    Compound Tincture of Benzoin   . Tape Rash    Current Outpatient Prescriptions on File Prior to Visit  Medication Sig Dispense Refill  . albuterol (VENTOLIN HFA) 108 (90 Base) MCG/ACT inhaler INHALE 2 PUFFS INTO THE LUNGS EVERY 6 HOURS AS NEEDED FOR WHEEZING OR SHORTNESS OF BREATH. 18 g 1  . baclofen (LIORESAL) 10 MG tablet     . budesonide-formoterol (SYMBICORT) 160-4.5 MCG/ACT inhaler Inhale 2 puffs into the lungs 2 (two) times daily. 1 Inhaler 12  . diazepam (VALIUM) 10 MG tablet Take 10 mg by mouth.    . ergocalciferol (VITAMIN D2) 50000 units capsule Take 1 capsule (50,000 Units total) by mouth once a week. 12 capsule 0  . esomeprazole (NEXIUM) 40 MG capsule TAKE 1 CAPSULE BY MOUTH DAILY BEFORE BREAKFAST. 30 capsule 11  . levocetirizine (XYZAL) 5 MG tablet Take 1 tablet (5 mg total) by mouth every evening. 30 tablet 5  . loratadine (CLARITIN) 10 MG tablet TAKE 1 TABLET BY MOUTH DAILY. 30 tablet 11  . ondansetron (ZOFRAN) 4 MG tablet Take 1 tablet (4 mg total) by mouth every 8  (eight) hours as needed for nausea or vomiting. 20 tablet 0  . oxyCODONE (OXY IR/ROXICODONE) 5 MG immediate release tablet Take 5 mg by mouth every 4 (four) hours as needed.    . rizatriptan (MAXALT) 5 MG tablet TAKE 1 TABLET BY MOUTH AT ONSET OF MIGRAINE, MAY REPEAT 2 HOURS LATER AS NEEDED. MAX 2 TABLETS PER DAY. 10  tablet 1   No current facility-administered medications on file prior to visit.     BP 113/71 (BP Location: Right Arm, Cuff Size: Large)   Pulse 84   Temp 98.2 F (36.8 C) (Oral)   Resp (!) 24   Ht 5\' 3"  (1.6 m)   Wt 189 lb 12.8 oz (86.1 kg)   SpO2 100% Comment: room air  BMI 33.62 kg/m    Objective:   Physical Exam  Constitutional: She is oriented to person, place, and time. She appears well-developed and well-nourished.  Cardiovascular: Normal rate, regular rhythm and normal heart sounds.   No murmur heard. Pulmonary/Chest: Effort normal and breath sounds normal. No respiratory distress. She has no wheezes.  Musculoskeletal:  Mild swelling of the left knee  Neurological: She is alert and oriented to person, place, and time.  Psychiatric: She has a normal mood and affect. Her behavior is normal. Judgment and thought content normal.  Mildly anxious appearing          Assessment & Plan:  L knee pain- uncontrolled refer back to Dr. Maxie Better (ortho)  Opiate withdrawal- advised pt to follow back up with pain clinic.  rx provided for promethazine to be used PRN nausea/restlessness. Advised pt to use imodium prn diarrhea.

## 2015-10-26 NOTE — Telephone Encounter (Signed)
See phone note (10/26/15).

## 2015-10-26 NOTE — Assessment & Plan Note (Signed)
At baseline. She does not appear to have sustained any significant injury from her fall today.

## 2015-10-26 NOTE — Telephone Encounter (Signed)
Crystal Ingles, RN from [facility] called to make office aware that patient is refusing disposition of calling 911.  Pt got up this morning, right leg gave out causing her to fall.  [name] says that patient is unable to stand.    Attempted to call patient twice using numbers listed.  Left a message for call back.    While typing note, pt called back.  Pt states she has nerve damage and must of slept wrong.  Right leg was numb.  She took a few steps towards the bathroom and fell, hitting her head on the carpet. Right leg is now tingling and she's starting to get feeling back in leg.  She's oriented x 3, but sounds to be in pain.  States having a headache 6/10. Speech clear, but slow.  Slightly dizzy.   Denies blurred vision, numbness and/weakness other than in right leg.  No chest pain or shortness of breath.  She is currently alone, but her brother is on his way.    Spoke to [place].  [place] agrees to patient needs to be seen today and taken to ER.  Called patient and made her aware.  She was a little upset, but agreed to go.

## 2015-10-26 NOTE — Telephone Encounter (Signed)
Patient called to cancel appt for today, states she is unable to come in today. States that her legs gave out on her this morning and she fell when getting out of bed, phone call was transferred to [facility]

## 2015-10-27 ENCOUNTER — Ambulatory Visit: Payer: Medicare Other | Admitting: Family

## 2015-11-25 ENCOUNTER — Ambulatory Visit: Payer: Medicare Other | Admitting: Family

## 2015-12-18 ENCOUNTER — Other Ambulatory Visit: Payer: Self-pay | Admitting: Obstetrics

## 2015-12-18 ENCOUNTER — Encounter: Payer: Self-pay | Admitting: Medical

## 2015-12-18 ENCOUNTER — Ambulatory Visit (INDEPENDENT_AMBULATORY_CARE_PROVIDER_SITE_OTHER): Payer: Medicare Other | Admitting: Medical

## 2015-12-18 ENCOUNTER — Other Ambulatory Visit: Payer: Self-pay | Admitting: Family

## 2015-12-18 VITALS — BP 116/78 | HR 80 | Temp 98.0°F | Ht 63.0 in | Wt 185.4 lb

## 2015-12-18 DIAGNOSIS — J0100 Acute maxillary sinusitis, unspecified: Secondary | ICD-10-CM | POA: Diagnosis not present

## 2015-12-18 DIAGNOSIS — J209 Acute bronchitis, unspecified: Secondary | ICD-10-CM | POA: Diagnosis not present

## 2015-12-18 DIAGNOSIS — L089 Local infection of the skin and subcutaneous tissue, unspecified: Secondary | ICD-10-CM | POA: Diagnosis not present

## 2015-12-18 MED ORDER — AMOXICILLIN-POT CLAVULANATE 875-125 MG PO TABS: 1.0000 | ORAL_TABLET | Freq: Two times a day (BID) | ORAL | 0 refills | Status: DC

## 2015-12-18 MED ORDER — BENZONATATE 100 MG PO CAPS: 100.0000 mg | ORAL_CAPSULE | Freq: Three times a day (TID) | ORAL | 0 refills | Status: DC | PRN

## 2015-12-18 MED ORDER — FLUTICASONE PROPIONATE 50 MCG/ACT NA SUSP: 2.0000 | Freq: Every day | NASAL | 1 refills | Status: DC

## 2015-12-18 NOTE — Progress Notes (Signed)
Pre visit review using our clinic review tool, if applicable. No additional management support is needed unless otherwise documented below in the visit note. 

## 2015-12-18 NOTE — Progress Notes (Signed)
Subjective:    Patient ID: [name], female    DOB: [birth year], 35 y.o.   MRN: [id number]  HPI Pt states on Tuesday phone was under couch. She could not reach it. Her son picked up couch fast and metal piece of cough caught her  Lt great toe nail and pulled it up a little. Since then some  daily  yellow discharge around edge of nail. Pt toe aches little near nail edge.  Pt states also nasal and chest congestion for about 2 days. Mild-moderate. When she blows nose and coughs will bring up mucous. Some sinus pressure. No wheezing, no st and no ear pain.  LMP- mirena.     Review of Systems  Constitutional: Negative for chills, fatigue and fever.       Occasional subjective fever.  HENT: Positive for congestion and sinus pressure. Negative for ear pain, sneezing and sore throat.   Respiratory: Positive for cough. Negative for chest tightness, shortness of breath and wheezing.   Cardiovascular: Negative for chest pain and palpitations.  Gastrointestinal: Negative for abdominal pain.  Musculoskeletal: Negative for back pain, myalgias and neck stiffness.       Lt great toe nail pain with some yellow discharge.  Skin: Negative for rash.  Neurological: Negative for dizziness, seizures and headaches.  Hematological: Negative for adenopathy. Does not bruise/bleed easily.  Psychiatric/Behavioral: Negative for behavioral problems and confusion.   Past Medical History:  Diagnosis Date  . Anxiety    no meds  . Asthma    "very badly" (01/17/2012)  . Cervical cancer (HCC) 1999; 2000  . CHF (congestive heart failure) (HCC) 2006   Resolved -History"with 2006 pregnancy, toxcemia "  not seeing a cardiologist now.  . Chronic back pain   . Chronic bronchitis (HCC)    "@ least once/yr" (01/17/2012) - none since 01/2012  . Complication of anesthesia    back surgeries x 6 - fusions T8-S2- rods  . Degenerative disk disease   . Difficult intubation    "needs small tube"  . Exertional  dyspnea    Asthma  . Family history of anesthesia complication    Nausea  . GERD (gastroesophageal reflux disease)   . Migraines    last migraine 09/2013  . Neuromuscular disorder (HCC)    sciatica  - affecting left leg numb/pain  . Pneumonia    "~ once/yr" (01/17/2012) none since 01/2012  . PONV (postoperative nausea and vomiting)   . Respiratory failure (HCC) 11/2010; 09/2011   Resolved  - History pneumonia     Social History   Social History  . Marital status: Married    Spouse name: N/A  . Number of children: 1  . Years of education: N/A   Occupational History  . Disabled    Social History Main Topics  . Smoking status: Former Smoker    Packs/day: 0.50    Years: 15.00    Types: Cigarettes    Quit date: 04/14/2013  . Smokeless tobacco: Never Used  . Alcohol use No  . Drug use: No     Comment: last smoked today March 2015  . Sexual activity: Yes    Partners: Male    Birth control/ protection: IUD     Comment: Last Encounter 02-09-14   Other Topics Concern  . Not on file   Social History Narrative   Married, 2 children   disabled    Past Surgical History:  Procedure Laterality Date  . ADENOIDECTOMY, TONSILLECTOMY AND MYRINGOTOMY WITH  TUBE PLACEMENT  1983?  . ANTERIOR LUMBAR FUSION  01/17/2012   Procedure: ANTERIOR LUMBAR FUSION 2 LEVELS;  Surgeon: Mat Carne, MD;  Location[facility] OR;  Service: Orthopedics;  Laterality: Bilateral;  Anterior Lumbar 3-4, Lumbar 4-5 fusion with bone graft harvest  . ANTERIOR LUMBAR FUSION    . BACK SURGERY  02/19/2014  . CERVICAL CONE BIOPSY  1999; 2000   cervical cancer  . CESAREAN SECTION N/A 11/08/2013   Procedure: REPEAT CESAREAN SECTION;  Surgeon: Brock Bad, MD;  Location: [facility] ORS;  Service: Obstetrics;  Laterality: N/A;  . KNEE ARTHROSCOPY  2004 X 2   "left X 2" (01/17/2012)  . LUMBAR DISC SURGERY  06/1998   "L4-5" (01/17/2012)  . LUMBAR FUSION  08/1998; 2010; 2012; 06/2011, 02/19/14   "fusion cage; put rods in;  reanchored screws; rod broke" (01/17/2012); Had son in 10/2013 and rods in back broke.   Marland Kitchen SPINAL FUSION  10/04/2011   Procedure: FUSION POSTERIOR SPINAL MULTILEVEL;  Surgeon: Mat Carne, MD;  Location: [facility] OR;  Service: Orthopedics;  Laterality: Bilateral;  repair lumbar pseudoarthritis, revision segmental fixation, L3-L4, L4-L5  . SPINAL FUSION  07/06/2015   T7 - S2  . TONSILLECTOMY      Family History  Problem Relation Age of Onset  . Heart disease Father   . Hypertension Father   . Diabetes Father     ESRD on dialysis  . Deep vein thrombosis Father   . Cancer    . COPD Mother   . Heart attack Sister     Allergies  Allergen Reactions  . Iodine Itching and Rash  . Ultram [Tramadol] Itching  . Other Rash    Compound Tincture of Benzoin   . Tape Rash    Pt reports she has not had an allergy to tape.     Current Outpatient Prescriptions on File Prior to Visit  Medication Sig Dispense Refill  . baclofen (LIORESAL) 10 MG tablet     . budesonide-formoterol (SYMBICORT) 160-4.5 MCG/ACT inhaler Inhale 2 puffs into the lungs 2 (two) times daily. 1 Inhaler 12  . desvenlafaxine (PRISTIQ) 50 MG 24 hr tablet Take 1 tablet (50 mg total) by mouth daily. 30 tablet 3  . diazepam (VALIUM) 10 MG tablet Take 10 mg by mouth.    . ergocalciferol (VITAMIN D2) 50000 units capsule Take 1 capsule (50,000 Units total) by mouth once a week. 12 capsule 0  . esomeprazole (NEXIUM) 40 MG capsule TAKE 1 CAPSULE BY MOUTH DAILY BEFORE BREAKFAST. 30 capsule 11  . loratadine (CLARITIN) 10 MG tablet TAKE 1 TABLET BY MOUTH DAILY. 30 tablet 11  . PROAIR HFA 108 (90 Base) MCG/ACT inhaler Inhale 2 puffs into the lungs every 6 (six) hours as needed for wheezing or shortness of breath. 1 Inhaler 2  . promethazine (PHENERGAN) 50 MG tablet Take 1 tablet (50 mg total) by mouth every 6 (six) hours as needed for nausea or vomiting. 20 tablet 0  . rizatriptan (MAXALT) 5 MG tablet TAKE 1 TABLET BY MOUTH AT ONSET OF  MIGRAINE, MAY REPEAT 2 HOURS LATER AS NEEDED. MAX 2 TABLETS PER DAY. 10 tablet 1   No current facility-administered medications on file prior to visit.     BP 116/78 (BP Location: Left Arm, Patient Position: Sitting)   Pulse 80   Temp 98 F (36.7 C) (Oral)   Ht 5\' 3"  (1.6 m)   Wt 185 lb 6.4 oz (84.1 kg)   SpO2 98%  BMI 32.84 kg/m       Objective:   Physical Exam  General  Mental Status - Alert. General Appearance - Well groomed. Not in acute distress.  Skin Rashes- No Rashes.  HEENT Head- Normal. Ear Auditory Canal - Left- Normal. Right - Normal.Tympanic Membrane- Left- Normal. Right- Normal. Eye Sclera/Conjunctiva- Left- Normal. Right- Normal. Nose & Sinuses Nasal Mucosa- Left-  Boggy and Congested. Right-  Boggy and  Congested.Bilateral mild maxillary and  Mild  frontal sinus pressure. Mouth & Throat Lips: Upper Lip- Normal: no dryness, cracking, pallor, cyanosis, or vesicular eruption. Lower Lip-Normal: no dryness, cracking, pallor, cyanosis or vesicular eruption. Buccal Mucosa- Bilateral- No Aphthous ulcers. Oropharynx- No Discharge or Erythema. Tonsils: Characteristics- Bilateral- No Erythema or Congestion. Size/Enlargement- Bilateral- No enlargement. Discharge- bilateral-None.  Neck Neck- Supple. No Masses.   Chest and Lung Exam Auscultation: Breath Sounds:-Clear even and unlabored.  Cardiovascular Auscultation:Rythm- Regular, rate and rhythm. Murmurs & Other Heart Sounds:Ausculatation of the heart reveal- No Murmurs.  Lymphatic Head & Neck General Head & Neck Lymphatics: Bilateral: Description- No Localized lymphadenopathy.  Lt foot-normal appearance. Except toenail has yellow discharge around edge. The nail lateral aspect looks a little elevated. But not real loose.       Assessment & Plan:  You appear to have bronchitis and sinusitis(more sinus infection). Rest hydrate and tylenol for fever. I am prescribing cough medicine  benzonatate, and  augmentin antibiotic. For your nasal congestion rx flonase counter nasal steroid.   You should gradually get better. If not then notify us and would recommend a chest xray.  For your toenail infection augmentin should resolve infection. We need to recheck the nail in 4-5 days to see if toe nail excision necessary.  Follow up in 4-5 days or as needed  [name], Ramon Dredge, [employer]

## 2015-12-18 NOTE — Patient Instructions (Signed)
You appear to have bronchitis and sinusitis(more sinus infection). Rest hydrate and tylenol for fever. I am prescribing cough medicine  benzonatate, and augmentin antibiotic. For your nasal congestion rx flonase counter nasal steroid.   You should gradually get better. If not then notify us and would recommend a chest xray.  For your toenail infection augmentin should resolve infection. We need to recheck the nail in 4-5 days to see if toe nail excision necessary.  Follow up in 4-5 days or as needed

## 2015-12-21 MED ORDER — BUDESONIDE-FORMOTEROL FUMARATE 160-4.5 MCG/ACT IN AERO: 2.0000 | INHALATION_SPRAY | Freq: Two times a day (BID) | RESPIRATORY_TRACT | 5 refills | Status: DC

## 2015-12-21 NOTE — Telephone Encounter (Signed)
Rx sent 

## 2015-12-21 NOTE — Telephone Encounter (Signed)
Lets send the 160 please.

## 2015-12-21 NOTE — Telephone Encounter (Signed)
Jaice-- Just received request for symbicort 80/4.5 and our current list says 160/4.5.  Which should pt be using?

## 2015-12-22 ENCOUNTER — Ambulatory Visit: Payer: Medicare Other | Admitting: Obstetrics

## 2015-12-23 ENCOUNTER — Ambulatory Visit (INDEPENDENT_AMBULATORY_CARE_PROVIDER_SITE_OTHER): Payer: Medicare Other | Admitting: Family

## 2015-12-23 ENCOUNTER — Encounter: Payer: Self-pay | Admitting: Family

## 2015-12-23 VITALS — BP 137/80 | HR 75 | Temp 98.4°F | Resp 18 | Ht 63.0 in | Wt 182.8 lb

## 2015-12-23 DIAGNOSIS — J019 Acute sinusitis, unspecified: Secondary | ICD-10-CM | POA: Diagnosis not present

## 2015-12-23 DIAGNOSIS — IMO0001 Reserved for inherently not codable concepts without codable children: Secondary | ICD-10-CM

## 2015-12-23 DIAGNOSIS — S91202D Unspecified open wound of left great toe with damage to nail, subsequent encounter: Secondary | ICD-10-CM | POA: Diagnosis not present

## 2015-12-23 DIAGNOSIS — E559 Vitamin D deficiency, unspecified: Secondary | ICD-10-CM | POA: Diagnosis not present

## 2015-12-23 MED ORDER — PROMETHAZINE HCL 50 MG PO TABS: 50.0000 mg | ORAL_TABLET | Freq: Four times a day (QID) | ORAL | 0 refills | Status: DC | PRN

## 2015-12-23 MED ORDER — FLUCONAZOLE 150 MG PO TABS: ORAL_TABLET | ORAL | 0 refills | Status: DC

## 2015-12-23 MED ORDER — ESOMEPRAZOLE MAGNESIUM 40 MG PO CPDR: DELAYED_RELEASE_CAPSULE | ORAL | 11 refills | Status: DC

## 2015-12-23 NOTE — Progress Notes (Signed)
Subjective:    Patient ID: [name], female    DOB: [birth year], 35 y.o.   MRN: [id number]  HPI  [name] is a 35 yr old female who presents today for following:  Sinusitis- does not feel that sinus infection is improving. Has some mild left ear pain.  She reports that her breathing is tight.  Cough is productive of yellow/green mucuous.  Toenail infection- continues augmentin, notes toenail "just fell off."    Vit D deficiency- wants level rechecked today.    Review of Systems See HPI  Past Medical History:  Diagnosis Date  . Anxiety    no meds  . Asthma    "very badly" (01/17/2012)  . Cervical cancer ([place]) 1999; 2000  . CHF (congestive heart failure) ([place]) 2006   Resolved -History"with 2006 pregnancy, toxcemia "  not seeing a cardiologist now.  . Chronic back pain   . Chronic bronchitis ([place])    "@ least once/yr" (01/17/2012) - none since 01/2012  . Complication of anesthesia    back surgeries x 6 - fusions T8-S2- rods  . Degenerative disk disease   . Difficult intubation    "needs small tube"  . Exertional dyspnea    Asthma  . Family history of anesthesia complication    Nausea  . GERD (gastroesophageal reflux disease)   . Migraines    last migraine 09/2013  . Neuromuscular disorder (HCC)    sciatica  - affecting left leg numb/pain  . Pneumonia    "~ once/yr" (01/17/2012) none since 01/2012  . PONV (postoperative nausea and vomiting)   . Respiratory failure ([place]) 11/2010; 09/2011   Resolved  - History pneumonia     Social History   Social History  . Marital status: Married    Spouse name: N/A  . Number of children: 1  . Years of education: N/A   Occupational History  . Disabled    Social History Main Topics  . Smoking status: Former Smoker    Packs/day: 0.50    Years: 15.00    Types: Cigarettes    Quit date: 04/14/2013  . Smokeless tobacco: Never Used  . Alcohol use No  . Drug use: No     Comment: last smoked today March 2015  .  Sexual activity: Yes    Partners: Male    Birth control/ protection: IUD     Comment: Last Encounter 02-09-14   Other Topics Concern  . Not on file   Social History Narrative   Married, 2 children   disabled    Past Surgical History:  Procedure Laterality Date  . ADENOIDECTOMY, TONSILLECTOMY AND MYRINGOTOMY WITH TUBE PLACEMENT  1983?  . ANTERIOR LUMBAR FUSION  01/17/2012   Procedure: ANTERIOR LUMBAR FUSION 2 LEVELS;  Surgeon: Lowella Grip, MD;  Location: [place];  Service: Orthopedics;  Laterality: Bilateral;  Anterior Lumbar 3-4, Lumbar 4-5 fusion with bone graft harvest  . ANTERIOR LUMBAR FUSION    . BACK SURGERY  02/19/2014  . CERVICAL CONE BIOPSY  1999; 2000   cervical cancer  . CESAREAN SECTION N/A 11/08/2013   Procedure: REPEAT CESAREAN SECTION;  Surgeon: Shelly Bombard, MD;  Location: [facility];  Service: Obstetrics;  Laterality: N/A;  . KNEE ARTHROSCOPY  2004 X 2   "left X 2" (01/17/2012)  . LUMBAR DISC SURGERY  06/1998   "L4-5" (01/17/2012)  . LUMBAR FUSION  08/1998; 2010; 2012; 06/2011, 02/19/14   "fusion cage; put rods in; reanchored screws; rod broke" (01/17/2012);  Had son in 10/2013 and rods in back broke.   Marland Kitchen SPINAL FUSION  10/04/2011   Procedure: FUSION POSTERIOR SPINAL MULTILEVEL;  Surgeon: Lowella Grip, MD;  Location: [place];  Service: Orthopedics;  Laterality: Bilateral;  repair lumbar pseudoarthritis, revision segmental fixation, L3-L4, L4-L5  . SPINAL FUSION  07/06/2015   T7 - S2  . TONSILLECTOMY      Family History  Problem Relation Age of Onset  . Heart disease Father   . Hypertension Father   . Diabetes Father     ESRD on dialysis  . Deep vein thrombosis Father   . Cancer    . COPD Mother   . Heart attack Sister     Allergies  Allergen Reactions  . Iodine Itching and Rash  . Ultram [Tramadol] Itching  . Other Rash    Compound Tincture of Benzoin   . Tape Rash    Pt reports she has not had an allergy to tape.     Current Outpatient  Prescriptions on File Prior to Visit  Medication Sig Dispense Refill  . amoxicillin-clavulanate (AUGMENTIN) 875-125 MG tablet Take 1 tablet by mouth 2 (two) times daily. 20 tablet 0  . benzonatate (TESSALON) 100 MG capsule Take 1 capsule (100 mg total) by mouth 3 (three) times daily as needed for cough. 21 capsule 0  . budesonide-formoterol (SYMBICORT) 160-4.5 MCG/ACT inhaler Inhale 2 puffs into the lungs 2 (two) times daily. 1 Inhaler 5  . diazepam (VALIUM) 10 MG tablet Take 10 mg by mouth.    . fluticasone (FLONASE) 50 MCG/ACT nasal spray Place 2 sprays into both nostrils daily. 16 g 1  . loratadine (CLARITIN) 10 MG tablet TAKE 1 TABLET BY MOUTH DAILY. 30 tablet 11  . PROAIR HFA 108 (90 Base) MCG/ACT inhaler Inhale 2 puffs into the lungs every 6 (six) hours as needed for wheezing or shortness of breath. 1 Inhaler 2  . rizatriptan (MAXALT) 5 MG tablet TAKE 1 TABLET BY MOUTH AT ONSET OF MIGRAINE, MAY REPEAT 2 HOURS LATER AS NEEDED. MAX 2 TABLETS PER DAY. 10 tablet 1   No current facility-administered medications on file prior to visit.     BP 137/80 (BP Location: Right Arm, Patient Position: Sitting, Cuff Size: Normal)   Pulse 75   Temp 98.4 F (36.9 C) (Oral)   Resp 18   Ht 5\' 3"  (1.6 m)   Wt 182 lb 12.8 oz (82.9 kg)   SpO2 100% Comment: RA  BMI 32.38 kg/m       Objective:   Physical Exam  Constitutional: She is oriented to person, place, and time. She appears well-developed and well-nourished.  HENT:  Right Ear: Tympanic membrane and ear canal normal.  Left Ear: Tympanic membrane and ear canal normal.  Mouth/Throat: No oropharyngeal exudate, posterior oropharyngeal edema or posterior oropharyngeal erythema.  Cardiovascular: Normal rate, regular rhythm and normal heart sounds.   No murmur heard. Pulmonary/Chest: Effort normal and breath sounds normal. No respiratory distress. She has no wheezes.  Lymphadenopathy:    She has no cervical adenopathy.  Neurological: She is alert  and oriented to person, place, and time.  Psychiatric: She has a normal mood and affect. Her behavior is normal. Judgment and thought content normal.  skin:  R great toenail missing, pink clean nailbed. No to swelling or erythema.         Assessment & Plan:  Sinusitis- I think she needs more time on the augmentin. I recommended that she continue augmentin, Start  flonase. Rinse sinuses twice daily with nasal saline spray.   Toenail trauma- no current sign of infection. Advised pt:  Apply antibiotic ointment and clean bandage to your toe once daily. Call if redness/drainage from the toe.   Vit D deficiency- check vit D level

## 2015-12-23 NOTE — Progress Notes (Signed)
Pre visit review using our clinic review tool, if applicable. No additional management support is needed unless otherwise documented below in the visit note. 

## 2015-12-23 NOTE — Patient Instructions (Signed)
Please complete lab work prior to leaving. Start flonase. Rinse sinuses twice daily with nasal saline spray.  Continue symbicort and augmentin. Apply antibiotic ointment and clean bandage to your toe once daily. Call if redness/drainage from the toe.  Call if sinus symptoms worsen or if not improved by the time you finish the augmentin.

## 2015-12-24 ENCOUNTER — Telehealth: Payer: Self-pay | Admitting: Family

## 2015-12-24 DIAGNOSIS — E559 Vitamin D deficiency, unspecified: Secondary | ICD-10-CM

## 2015-12-24 LAB — VITAMIN D 25 HYDROXY (VIT D DEFICIENCY, FRACTURES): VITD: 24.36 ng/mL — AB (ref 30.00–100.00)

## 2015-12-24 MED ORDER — ERGOCALCIFEROL 1.25 MG (50000 UT) PO CAPS: 50000.0000 [IU] | ORAL_CAPSULE | ORAL | 0 refills | Status: DC

## 2015-12-24 NOTE — Telephone Encounter (Signed)
Vitamin D level is low.  Advise patient to begin vit D 50000 units once weekly for 12 weeks, then repeat vit D level (dx Vit D deficiency).     

## 2015-12-25 NOTE — Telephone Encounter (Signed)
I spoke with the patient and she states that she will call back to schedule a lab appointment. She did not have any further questions.

## 2015-12-30 ENCOUNTER — Telehealth: Payer: Self-pay | Admitting: Family

## 2015-12-30 MED ORDER — AZITHROMYCIN 250 MG PO TABS: ORAL_TABLET | ORAL | 0 refills | Status: DC

## 2015-12-30 NOTE — Telephone Encounter (Signed)
Notified pt. 

## 2015-12-30 NOTE — Telephone Encounter (Signed)
rx sent

## 2015-12-30 NOTE — Telephone Encounter (Signed)
°  Relation to [id number] Call back [phone] Pharmacy:[facility]  Reason for call: pt was seen on 11/8 and states Lailynn informed her to call and make her aware if the antibiotics didn't work. Pt states she is still coughing, congested, yellow/green mucus, wanted to know if Evelina can send in the z-pak for her

## 2016-01-01 MED ORDER — AZITHROMYCIN 250 MG PO TABS: ORAL_TABLET | ORAL | 0 refills | Status: DC

## 2016-01-01 NOTE — Addendum Note (Signed)
Addended by: Kelle Darting A on: 01/01/2016 03:11 PM   Modules accepted: Orders

## 2016-01-01 NOTE — Telephone Encounter (Signed)
Rx sent, notified pt. 

## 2016-01-01 NOTE — Telephone Encounter (Signed)
Ok to send refill  On zpak.

## 2016-01-01 NOTE — Telephone Encounter (Signed)
Pt called in because she says that her son threw away her Rx. She would like to know If provider could give her a new one?    Please advise

## 2016-01-12 ENCOUNTER — Ambulatory Visit: Payer: Medicare Other | Admitting: Obstetrics

## 2016-01-20 ENCOUNTER — Telehealth: Payer: Self-pay | Admitting: Family

## 2016-01-20 NOTE — Telephone Encounter (Signed)
Patient has been on medicare since 2008 scheduled medicare wellness with the nurse for 01/25/16 at 1:30 with Hoyle Sauer.

## 2016-01-25 ENCOUNTER — Encounter: Payer: Self-pay | Admitting: *Deleted

## 2016-01-25 ENCOUNTER — Ambulatory Visit (INDEPENDENT_AMBULATORY_CARE_PROVIDER_SITE_OTHER): Payer: Medicare Other | Admitting: *Deleted

## 2016-01-25 VITALS — BP 116/72 | HR 89 | Resp 16 | Ht 63.0 in | Wt 180.0 lb

## 2016-01-25 DIAGNOSIS — F329 Major depressive disorder, single episode, unspecified: Secondary | ICD-10-CM

## 2016-01-25 DIAGNOSIS — F32A Depression, unspecified: Secondary | ICD-10-CM

## 2016-01-25 DIAGNOSIS — Z0000 Encounter for general adult medical examination without abnormal findings: Secondary | ICD-10-CM | POA: Diagnosis not present

## 2016-01-25 NOTE — Addendum Note (Signed)
Addended by: Dorrene German on: 01/25/2016 02:44 PM   Modules accepted: Orders

## 2016-01-25 NOTE — Progress Notes (Signed)
Pre visit review using our clinic review tool, if applicable. No additional management support is needed unless otherwise documented below in the visit note. 

## 2016-01-25 NOTE — Assessment & Plan Note (Signed)
Ongoing for pt. 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 Ongoing management per PCP.

## 2016-01-25 NOTE — Progress Notes (Signed)
Noted and agree. 

## 2016-01-25 NOTE — Patient Instructions (Addendum)
Schedule an appt w/ local eye doctor and dentist. Schedule appt w/ Nieves at your convenience for plantar wart.  Hang in there!!

## 2016-01-25 NOTE — Progress Notes (Signed)
Subjective:   [name] is a 35 y.o. female who presents for Medicare Annual (Subsequent) preventive examination.  Review of Systems:  No ROS.  Medicare Wellness Visit.  Cardiac Risk Factors include: obesity (BMI >30kg/m2);sedentary lifestyle  Sleep patterns: is not rested upon awakening and has daytime sleepiness. 4-5 hrs nightly. Feels tired.  Home Safety/Smoke Alarms: Feels safe in home. Smoke alarms in place.    Living environment; residence and Firearm Safety: Lives w/ 2 sons and husband in apartment. Firearms stored in a locked cabinet out of reach of children. Seat Belt Safety/Bike Helmet: Wears seat belt.   Counseling:   Eye Exam- Does not have eye doctor. Last eye exam 4-5 years ago.  Dental- Does not follow w/ dentist regularly. States has some cavities.  Female:   Pap- last 04/10/13, NEGATIVE FOR INTRAEPITHELIAL LESIONS OR MALIGNANCY.      Mammo- last 09/07/15, BI-RADS CATEGORY  1: Negative.       Dexa scan- N/A due to age       53- N/A due to age     Objective:     Vitals: BP 116/72 (BP Location: Right Arm, Patient Position: Sitting, Cuff Size: Normal)   Pulse 89   Resp 16   Ht 5' 3"  (1.6 m)   Wt 180 lb (81.6 kg)   SpO2 100%   BMI 31.89 kg/m   Body mass index is 31.89 kg/m.   Tobacco History  Smoking Status  . Current Every Day Smoker  . Years: 15.00  . Types: Cigarettes  . Last attempt to quit: 04/14/2013  Smokeless Tobacco  . Never Used    Comment: 3 cigarettes daily     Ready to quit: Yes Counseling given: Yes   Past Medical History:  Diagnosis Date  . Anxiety    no meds  . Asthma    "very badly" (01/17/2012)  . Cervical cancer ([place]) 1999; 2000  . CHF (congestive heart failure) ([place]) 2006   Resolved -History"with 2006 pregnancy, toxcemia "  not seeing a cardiologist now.  . Chronic back pain   . Chronic bronchitis ([place])    "@ least once/yr" (01/17/2012) - none since 01/2012  . Complication of anesthesia    back surgeries x 6 -  fusions T8-S2- rods  . Degenerative disk disease   . Difficult intubation    "needs small tube"  . Exertional dyspnea    Asthma  . Family history of anesthesia complication    Nausea  . GERD (gastroesophageal reflux disease)   . Migraines    last migraine 09/2013  . Neuromuscular disorder (HCC)    sciatica  - affecting left leg numb/pain  . Pneumonia    "~ once/yr" (01/17/2012) none since 01/2012  . PONV (postoperative nausea and vomiting)   . Respiratory failure ([place]) 11/2010; 09/2011   Resolved  - History pneumonia   Past Surgical History:  Procedure Laterality Date  . ADENOIDECTOMY, TONSILLECTOMY AND MYRINGOTOMY WITH TUBE PLACEMENT  1983?  . ANTERIOR LUMBAR FUSION  01/17/2012   Procedure: ANTERIOR LUMBAR FUSION 2 LEVELS;  Surgeon: Lowella Grip, MD;  Location: [facility];  Service: Orthopedics;  Laterality: Bilateral;  Anterior Lumbar 3-4, Lumbar 4-5 fusion with bone graft harvest  . ANTERIOR LUMBAR FUSION    . BACK SURGERY  02/19/2014  . CERVICAL CONE BIOPSY  1999; 2000   cervical cancer  . CESAREAN SECTION N/A 11/08/2013   Procedure: REPEAT CESAREAN SECTION;  Surgeon: Shelly Bombard, MD;  Location: [facility];  Service: Obstetrics;  Laterality: N/A;  . KNEE ARTHROSCOPY  2004 X 2   "left X 2" (01/17/2012)  . LUMBAR DISC SURGERY  06/1998   "L4-5" (01/17/2012)  . LUMBAR FUSION  08/1998; 2010; 2012; 06/2011, 02/19/14   "fusion cage; put rods in; reanchored screws; rod broke" (01/17/2012); Had son in 10/2013 and rods in back broke.   Marland Kitchen SPINAL FUSION  10/04/2011   Procedure: FUSION POSTERIOR SPINAL MULTILEVEL;  Surgeon: Lowella Grip, MD;  Location: [facility];  Service: Orthopedics;  Laterality: Bilateral;  repair lumbar pseudoarthritis, revision segmental fixation, L3-L4, L4-L5  . SPINAL FUSION  07/06/2015   T7 - S2  . TONSILLECTOMY     Family History  Problem Relation Age of Onset  . COPD Mother   . Heart disease Father   . Hypertension Father   . Diabetes Father     ESRD on  dialysis  . Deep vein thrombosis Father   . Cancer    . Heart attack Sister    History  Sexual Activity  . Sexual activity: Yes  . Partners: Male  . Birth control/ protection: IUD    Comment: Last Encounter 02-09-14    Outpatient Encounter Prescriptions as of 01/25/2016  Medication Sig  . Oxycodone HCl 10 MG TABS Take 10 mg by mouth 4 (four) times daily.  Marland Kitchen amoxicillin-clavulanate (AUGMENTIN) 875-125 MG tablet Take 1 tablet by mouth 2 (two) times daily. (Patient not taking: Reported on 01/25/2016)  . azithromycin (ZITHROMAX Z-PAK) 250 MG tablet 2 tabs by mouth today, then one tab by mouth once daily for 4 more days (Patient not taking: Reported on 01/25/2016)  . benzonatate (TESSALON) 100 MG capsule Take 1 capsule (100 mg total) by mouth 3 (three) times daily as needed for cough. (Patient not taking: Reported on 01/25/2016)  . budesonide-formoterol (SYMBICORT) 160-4.5 MCG/ACT inhaler Inhale 2 puffs into the lungs 2 (two) times daily.  . diazepam (VALIUM) 10 MG tablet Take 10 mg by mouth.  . ergocalciferol (VITAMIN D2) 50000 units capsule Take 1 capsule (50,000 Units total) by mouth once a week.  . esomeprazole (NEXIUM) 40 MG capsule TAKE 1 CAPSULE BY MOUTH DAILY BEFORE BREAKFAST.  . fluconazole (DIFLUCAN) 150 MG tablet 1 tab by mouth today, may repeat in 3 days if symptoms persist  . fluticasone (FLONASE) 50 MCG/ACT nasal spray Place 2 sprays into both nostrils daily.  Marland Kitchen loratadine (CLARITIN) 10 MG tablet TAKE 1 TABLET BY MOUTH DAILY.  Marland Kitchen PROAIR HFA 108 (90 Base) MCG/ACT inhaler Inhale 2 puffs into the lungs every 6 (six) hours as needed for wheezing or shortness of breath.  . promethazine (PHENERGAN) 50 MG tablet Take 1 tablet (50 mg total) by mouth every 6 (six) hours as needed for nausea or vomiting.  . rizatriptan (MAXALT) 5 MG tablet TAKE 1 TABLET BY MOUTH AT ONSET OF MIGRAINE, MAY REPEAT 2 HOURS LATER AS NEEDED. MAX 2 TABLETS PER DAY.  . [DISCONTINUED] promethazine (PHENERGAN) 25 MG  tablet Take 25 mg by mouth as needed.   No facility-administered encounter medications on file as of 01/25/2016.     Activities of Daily Living In your present state of health, do you have any difficulty performing the following activities: 01/25/2016 03/06/2015  Hearing? N N  Vision? Y Y  Difficulty concentrating or making decisions? N Y  Walking or climbing stairs? N Y  Dressing or bathing? N Y  Doing errands, shopping? N Y  Conservation officer, nature and eating ? N -  Using the Toilet? N -  In the past six months, have you accidently leaked urine? N -  Do you have problems with loss of bowel control? N -  Managing your Medications? N -  Managing your Finances? N -  Housekeeping or managing your Housekeeping? N -  Some recent data might be hidden    Patient Care Team: Debbrah Alar, NP as PCP - General (Internal Medicine) Shelly Bombard, MD as Consulting Physician (Obstetrics and Gynecology) Atilano Ina, MD as Consulting Physician (Neurosurgery)    Assessment:    Physical assessment deferred to PCP.  Pt concerns: Plantar wart on R foot that has not responded to home remedies.  Hearing/Vision:   Visual Acuity Screening   Right eye Left eye Both eyes  Without correction: 20/30 20/25 20/25   With correction:     Comments: Does not wear glasses or contacts, does not have eye doctor. Pt reports decreased visual acuity and needing to blink more than usual.  Hearing Screening Comments: Able to hear conversational tones w/o difficulty. No issues reported.    Exercise Activities and Dietary recommendations Current Exercise Habits: Structured exercise class, Frequency (Times/Week): 2, Intensity: Moderate  Diet (meal preparation, eat out, water intake, caffeinated beverages, dairy products, fruits and vegetables): on average, 2 meals per day. Meat, vegetable, starch at most meals. Drinks soda, trying to cut back. Drinks some coffee. Trying to increase water intake. Combo of eating  in and eating out.    Goals    . Increase mobility    . Increase physical activity      Fall Risk Fall Risk  01/25/2016 10/10/2014  Falls in the past year? Yes No  Number falls in past yr: 1 -  Injury with Fall? No -  Risk for fall due to : - Impaired mobility;Medication side effect  Risk for fall due to (comments): - educated and given handout   Depression Screen PHQ 2/9 Scores 01/25/2016 10/10/2014  PHQ - 2 Score 1 4  PHQ- 9 Score - 14     Cognitive Function MMSE - Mini Mental State Exam 01/25/2016  Orientation to time 5  Orientation to Place 5  Registration 3  Attention/ Calculation 5  Recall 3  Language- name 2 objects 2  Language- repeat 1  Language- follow 3 step command 3  Language- read & follow direction 1  Write a sentence 1  Copy design 1  Total score 30        Immunization History  Administered Date(s) Administered  . Influenza,inj,Quad PF,36+ Mos 11/05/2014, 10/26/2015  . Influenza-Unspecified 10/15/2013  . MMR 11/10/2013  . Pneumococcal-Unspecified 11/15/2010  . Tdap 10/08/2013   Screening Tests Health Maintenance  Topic Date Due  . PAP SMEAR  04/10/2016  . MAMMOGRAM  09/06/2016  . TETANUS/TDAP  10/09/2023  . INFLUENZA VACCINE  Completed  . HIV Screening  Completed      Plan:   Follow-up w/ PCP as directed-pt will call to schedule next appt. Dental resources list given. Recommended establishing w/ a local eye doctor and dentist.  During the course of the visit the patient was educated and counseled about the following appropriate screening and preventive services:   Vaccines to include Pneumoccal, Influenza, Hepatitis B, Td, Zostavax, HCV  Cardiovascular Disease  Diabetes screening  Glaucoma screening  Mammography/PAP  Nutrition counseling   Smoking cessation counseling  Patient Instructions (the written plan) was given to the patient.   Dorrene German, RN  01/25/2016

## 2016-02-10 ENCOUNTER — Telehealth: Payer: Self-pay | Admitting: Family

## 2016-02-10 ENCOUNTER — Ambulatory Visit: Payer: Medicare Other | Admitting: Family Medicine

## 2016-02-10 NOTE — Telephone Encounter (Signed)
°  Relationship to patient: Self  Can be reached: [phone]   Reason for call: Patient called at 9:26 stating she had an emergency with her son and can not make her appt. Charge or No charge.

## 2016-02-10 NOTE — Telephone Encounter (Signed)
No charge. 

## 2016-02-16 ENCOUNTER — Other Ambulatory Visit: Payer: Self-pay | Admitting: Family

## 2016-02-16 NOTE — Telephone Encounter (Signed)
Refill sent per [facility] refill protocol/SLS  

## 2016-03-04 ENCOUNTER — Encounter: Payer: Self-pay | Admitting: Physician Assistant

## 2016-03-04 ENCOUNTER — Telehealth: Payer: Self-pay | Admitting: *Deleted

## 2016-03-04 ENCOUNTER — Telehealth: Payer: Self-pay | Admitting: Family

## 2016-03-04 ENCOUNTER — Ambulatory Visit (INDEPENDENT_AMBULATORY_CARE_PROVIDER_SITE_OTHER): Payer: Medicare Other | Admitting: Physician Assistant

## 2016-03-04 VITALS — BP 120/80 | HR 104 | Temp 99.0°F | Resp 16 | Ht 63.0 in | Wt 181.4 lb

## 2016-03-04 DIAGNOSIS — B070 Plantar wart: Secondary | ICD-10-CM | POA: Diagnosis not present

## 2016-03-04 NOTE — Progress Notes (Signed)
Pre visit review using our clinic review tool, if applicable. No additional management support is needed unless otherwise documented below in the visit note. 

## 2016-03-04 NOTE — Patient Instructions (Signed)
It was great meeting you today!  If you feel like you need another freezing treatment, please schedule a visit with [facility] for that in about one month.

## 2016-03-04 NOTE — Telephone Encounter (Signed)
Can address at office visit today.

## 2016-03-04 NOTE — Telephone Encounter (Signed)
Patient came in today for an office visit and asked if we could send a message to you about a couple of things.  1.  She would like to be changed back to omeprazole BID instead of the Nexium. She states that the omeprazole works better.  2.  She would like help to stop smoking.  She would like to try something like Wellbutrin.

## 2016-03-04 NOTE — Progress Notes (Signed)
Subjective:    Patient ID: [name], female    DOB: 1980/04/09, 36 y.o.   MRN: [id number]  HPI  [name] is a 36 y/o female who presents with a "corn" on the ball of her foot. Has been present for two months. Has tried Dr. Zoe Lan freezing kit, liquid removal, cushions -- all without relief. No discharge, fevers, chills. She had a pair of shoes with memory foam that has a place inside that she suspects was irritating the area.   Review of Systems  See HPI  Past Medical History:  Diagnosis Date  . Anxiety    no meds  . Asthma    "very badly" (01/17/2012)  . Cervical cancer ([place]) 1999; 2000  . CHF (congestive heart failure) ([place]) 2006   Resolved -History"with 2006 pregnancy, toxcemia "  not seeing a cardiologist now.  . Chronic back pain   . Chronic bronchitis ([place])    "@ least once/yr" (01/17/2012) - none since 01/2012  . Complication of anesthesia    back surgeries x 6 - fusions T8-S2- rods  . Degenerative disk disease   . Difficult intubation    "needs small tube"  . Exertional dyspnea    Asthma  . Family history of anesthesia complication    Nausea  . GERD (gastroesophageal reflux disease)   . Migraines    last migraine 09/2013  . Neuromuscular disorder (HCC)    sciatica  - affecting left leg numb/pain  . Pneumonia    "~ once/yr" (01/17/2012) none since 01/2012  . PONV (postoperative nausea and vomiting)   . Respiratory failure ([place]) 11/2010; 09/2011   Resolved  - History pneumonia     Social History   Social History  . Marital status: Married    Spouse name: N/A  . Number of children: 1  . Years of education: N/A   Occupational History  . Disabled    Social History Main Topics  . Smoking status: Current Every Day Smoker    Years: 15.00    Types: Cigarettes    Last attempt to quit: 04/14/2013  . Smokeless tobacco: Never Used     Comment: 3 cigarettes daily  . Alcohol use No  . Drug use: No     Comment: last smoked today March 2015  .  Sexual activity: Yes    Partners: Male    Birth control/ protection: IUD     Comment: Last Encounter 02-09-14   Other Topics Concern  . Not on file   Social History Narrative   Married, 2 children   disabled    Past Surgical History:  Procedure Laterality Date  . ADENOIDECTOMY, TONSILLECTOMY AND MYRINGOTOMY WITH TUBE PLACEMENT  1983?  . ANTERIOR LUMBAR FUSION  01/17/2012   Procedure: ANTERIOR LUMBAR FUSION 2 LEVELS;  Surgeon: Lowella Grip, MD;  Location: [facility];  Service: Orthopedics;  Laterality: Bilateral;  Anterior Lumbar 3-4, Lumbar 4-5 fusion with bone graft harvest  . ANTERIOR LUMBAR FUSION    . BACK SURGERY  02/19/2014  . CERVICAL CONE BIOPSY  1999; 2000   cervical cancer  . CESAREAN SECTION N/A 11/08/2013   Procedure: REPEAT CESAREAN SECTION;  Surgeon: Shelly Bombard, MD;  Location: [facility];  Service: Obstetrics;  Laterality: N/A;  . KNEE ARTHROSCOPY  2004 X 2   "left X 2" (01/17/2012)  . LUMBAR DISC SURGERY  06/1998   "L4-5" (01/17/2012)  . LUMBAR FUSION  08/1998; 2010; 2012; 06/2011, 02/19/14   "fusion cage; put rods in;  reanchored screws; rod broke" (01/17/2012); Had son in 10/2013 and rods in back broke.   Marland Kitchen SPINAL FUSION  10/04/2011   Procedure: FUSION POSTERIOR SPINAL MULTILEVEL;  Surgeon: Lowella Grip, MD;  Location: [facility];  Service: Orthopedics;  Laterality: Bilateral;  repair lumbar pseudoarthritis, revision segmental fixation, L3-L4, L4-L5  . SPINAL FUSION  07/06/2015   T7 - S2  . TONSILLECTOMY      Family History  Problem Relation Age of Onset  . COPD Mother   . Heart disease Father   . Hypertension Father   . Diabetes Father     ESRD on dialysis  . Deep vein thrombosis Father   . Cancer    . Heart attack Sister     Allergies  Allergen Reactions  . Iodine Itching and Rash  . Ultram [Tramadol] Itching  . Other Rash    Compound Tincture of Benzoin     Current Outpatient Prescriptions on File Prior to Visit  Medication Sig Dispense Refill    . budesonide-formoterol (SYMBICORT) 160-4.5 MCG/ACT inhaler Inhale 2 puffs into the lungs 2 (two) times daily. 1 Inhaler 5  . diazepam (VALIUM) 10 MG tablet Take 10 mg by mouth.    . ergocalciferol (VITAMIN D2) 50000 units capsule Take 1 capsule (50,000 Units total) by mouth once a week. 12 capsule 0  . fluconazole (DIFLUCAN) 150 MG tablet 1 tab by mouth today, may repeat in 3 days if symptoms persist 2 tablet 0  . fluticasone (FLONASE) 50 MCG/ACT nasal spray Place 2 sprays into both nostrils daily. 16 g 1  . loratadine (CLARITIN) 10 MG tablet TAKE 1 TABLET BY MOUTH DAILY. 30 tablet 11  . Oxycodone HCl 10 MG TABS Take 10 mg by mouth 4 (four) times daily.  0  . PROAIR HFA 108 (90 Base) MCG/ACT inhaler INHALE 2 PUFFS INTO THE LUNGS EVERY 6 HOURS AS NEEDED FOR WHEEZING OR SHORTNESS OF BREATH. 8.5 g 2  . promethazine (PHENERGAN) 50 MG tablet Take 1 tablet (50 mg total) by mouth every 6 (six) hours as needed for nausea or vomiting. 15 tablet 0  . rizatriptan (MAXALT) 5 MG tablet TAKE 1 TABLET BY MOUTH AT ONSET OF MIGRAINE, MAY REPEAT 2 HOURS LATER AS NEEDED. MAX 2 TABLETS PER DAY. 10 tablet 1  . esomeprazole (NEXIUM) 40 MG capsule TAKE 1 CAPSULE BY MOUTH DAILY BEFORE BREAKFAST. (Patient not taking: Reported on 03/04/2016) 30 capsule 11   No current facility-administered medications on file prior to visit.     BP 120/80 (BP Location: Right Arm, Cuff Size: Normal)   Pulse (!) 104   Temp 99 F (37.2 C) (Oral)   Resp 16   Ht 5' 3"  (1.6 m)   Wt 181 lb 6.4 oz (82.3 kg)   SpO2 98%   BMI 32.13 kg/m        Objective:   Physical Exam  Constitutional: Vital signs are normal. She appears well-developed. She is cooperative.  HENT:  Head: Normocephalic and atraumatic.  Cardiovascular: Normal rate, regular rhythm and normal heart sounds.   Pulmonary/Chest: Effort normal and breath sounds normal.  Musculoskeletal:       Feet:  Neurological: She is alert.  Nursing note and vitals reviewed.       Assessment & Plan:  1. Plantar wart After verbal consent was obtained, attempted to freeze 1 wart on R foot (freeze/thaw x 3). Pt tolerated procedure well. Discussed that if this treatment fails, she can follow-up with PCP for in-office debridement in  approximately 1 month.  Inda Coke PA-C 03/04/16

## 2016-03-04 NOTE — Telephone Encounter (Signed)
Relation to [id number] Call back number:[phone]   Reason for call:  Patient scheduled acute appointment with [facility] for 3:30pm due to right foot corn pain requesting freezing solution, is this something we do in office, please advise

## 2016-03-06 MED ORDER — BUPROPION HCL ER (SR) 150 MG PO TB12: ORAL_TABLET | ORAL | 1 refills | Status: DC

## 2016-03-06 MED ORDER — OMEPRAZOLE 40 MG PO CPDR: 40.0000 mg | DELAYED_RELEASE_CAPSULE | Freq: Every day | ORAL | 5 refills | Status: DC

## 2016-03-06 NOTE — Telephone Encounter (Signed)
I will send rx for omeprazole, however she really shouldn't be on a twice daily dosing regimen long term.  I will send rx for once daily and I want her to really focus on dietary changes for GERD (small frequent meals, no late night eating, avoid carbonated beverages, high acidity foods, fried foods).    Rx sent for wellbutrin. She should plan to start wellbutrin 1 week prior to quit date.  Call if she develops mood issues while on wellbutrin.  Follow up in 1 month.

## 2016-03-07 NOTE — Telephone Encounter (Signed)
Left detailed message on pt's cell# and to call and schedule 1 month follow up as below or let us know if she has any questions.

## 2016-03-11 ENCOUNTER — Telehealth: Payer: Self-pay | Admitting: Family

## 2016-03-11 NOTE — Telephone Encounter (Signed)
Relation to [id number] Call back [phone]   Reason for call:  Patient requesting her renewal handicap place card

## 2016-03-11 NOTE — Telephone Encounter (Signed)
Handicap placard printed, filled in as much as possible and forwarded to PCP for review and completion.

## 2016-03-18 NOTE — Telephone Encounter (Signed)
Form placed at front desk for pick up and detailed message left on pt's voicemail.

## 2016-03-21 NOTE — Telephone Encounter (Signed)
Patient aware. Patient stated she will pick up the form at her next appointment on 04/06/16

## 2016-04-06 ENCOUNTER — Encounter: Payer: Self-pay | Admitting: Family

## 2016-04-06 ENCOUNTER — Ambulatory Visit (INDEPENDENT_AMBULATORY_CARE_PROVIDER_SITE_OTHER): Payer: Medicare Other | Admitting: Family

## 2016-04-06 VITALS — BP 120/83 | HR 99 | Temp 98.5°F | Ht 63.0 in | Wt 182.2 lb

## 2016-04-06 DIAGNOSIS — B070 Plantar wart: Secondary | ICD-10-CM

## 2016-04-06 NOTE — Progress Notes (Signed)
   Subjective:    Patient ID: [name], female    DOB: 16-Oct-1980, 36 y.o.   MRN: [id number]  HPI  [name] is a 36 yr old female who presents today for follow up of plantar wart.    Review of Systems See HPI    Objective:   Physical Exam  Constitutional: She is oriented to person, place, and time. She appears well-developed and well-nourished. No distress.  Musculoskeletal: She exhibits no edema.  Neurological: She is alert and oriented to person, place, and time.  Skin:  approx 1 cm wide planter wart plantar surface at base of right MTP  Psychiatric: She has a normal mood and affect. Her behavior is normal. Judgment and thought content normal.          Assessment & Plan:  Plantar wart- wart was debrided using a sterile blade.  Base of wart was then frozen using liquid nitrogen (freeze/thaw) x 3.  Pt tolerated procedure well. Advised pt to apply over the counter wart remover (salicyic acid) daily to wart and cover with duct tape.

## 2016-04-06 NOTE — Patient Instructions (Signed)
Apply over the counter wart remover (salicyic acid) daily to wart and cover with Duct tape.

## 2016-04-06 NOTE — Progress Notes (Signed)
Pre visit review using our clinic review tool, if applicable. No additional management support is needed unless otherwise documented below in the visit note. 

## 2016-05-04 ENCOUNTER — Ambulatory Visit: Payer: Medicare Other | Admitting: Family

## 2016-05-05 ENCOUNTER — Telehealth: Payer: Self-pay | Admitting: Family

## 2016-05-05 DIAGNOSIS — B070 Plantar wart: Secondary | ICD-10-CM

## 2016-05-05 NOTE — Telephone Encounter (Signed)
Caller name: Relationship to patient: Self Can be reached: [phone]  Pharmacy:  Reason for call: Request a referral to a Podiatrist for her feet. States she has been seeing provider for this issue

## 2016-05-06 NOTE — Telephone Encounter (Signed)
Left detailed message on cell# and to let us know if she has not been contacted within 1 week about appt.

## 2016-05-17 ENCOUNTER — Ambulatory Visit: Payer: Medicare Other | Admitting: Podiatry

## 2016-05-24 ENCOUNTER — Telehealth: Payer: Self-pay

## 2016-05-24 ENCOUNTER — Ambulatory Visit (INDEPENDENT_AMBULATORY_CARE_PROVIDER_SITE_OTHER): Payer: Medicare Other | Admitting: Podiatry

## 2016-05-24 ENCOUNTER — Encounter: Payer: Self-pay | Admitting: Podiatry

## 2016-05-24 ENCOUNTER — Ambulatory Visit (HOSPITAL_BASED_OUTPATIENT_CLINIC_OR_DEPARTMENT_OTHER)
Admission: RE | Admit: 2016-05-24 | Discharge: 2016-05-24 | Disposition: A | Discharge status: Home / Self Care | Payer: Medicare Other | Source: Ambulatory Visit | Attending: Podiatry | Admitting: Podiatry

## 2016-05-24 DIAGNOSIS — S93402A Sprain of unspecified ligament of left ankle, initial encounter: Secondary | ICD-10-CM

## 2016-05-24 DIAGNOSIS — B070 Plantar wart: Secondary | ICD-10-CM

## 2016-05-24 DIAGNOSIS — M25572 Pain in left ankle and joints of left foot: Secondary | ICD-10-CM | POA: Diagnosis not present

## 2016-05-24 DIAGNOSIS — L601 Onycholysis: Secondary | ICD-10-CM

## 2016-05-24 MED ORDER — MELOXICAM 7.5 MG PO TABS: 7.5000 mg | ORAL_TABLET | Freq: Every day | ORAL | 0 refills | Status: DC

## 2016-05-24 NOTE — Patient Instructions (Signed)
Take dressing off in 8 hours and wash the foot with soap and water. If it is hurting or becomes uncomfortable before the 8 hours, go ahead and remove the bandage and wash the area.  If it blisters, apply antibiotic ointment and a band-aid.  Monitor for any signs/symptoms of infection. Call the office immediately if any occur or go directly to the emergency room. Call with any questions/concerns.   

## 2016-05-24 NOTE — Progress Notes (Signed)
   Subjective:    Patient ID: [name], female    DOB: 1980/10/12, 36 y.o.   MRN: [id number]  HPI  36 year old female presents the office today for concerns of a painful wart to the right foot and she points the ball of her foot which she gets this. She has tried over-the-counter treatment without any improvement in the areas become painful with pressure in shoes. She denies any recent injury or trauma to the area and denies stepping on any foreign objects. Denies he swelling or redness or any drainage.  Also while here she states that yesterday she sprained her left ankle after twisting it. She is able to weight-bear on the ankle but she does have pain and some swelling throughout the ankle. She said no treatment for this since yesterday. Denies any other injury.  Lastly she states her left big toenail did fall off on its own but denies any drainage or pus or any redness or swelling. She said no other complaints today.  Review of Systems  All other systems reviewed and are negative.      Objective:   Physical Exam General: AAO x3, NAD  Dermatological: left hallux toenails not present Nail bed intact. The stem edema, erythema, drainage or pus in the areas healed. There is no open lesion identified. On the plantar aspect of the right foot are 2 areas of hyperkeratotic lesions. Upon debridement there is pinpoint bleeding and evidence of verruca. There is no erythema, ascending cellulitis. There is no clinical signs of infection.  Vascular: Dorsalis Pedis artery and Posterior Tibial artery pedal pulses are 2/4 bilateral with immedate capillary fill time. Pedal hair growth present.  There is no pain with calf compression, swelling, warmth, erythema.   Neruologic: Grossly intact via light touch bilateral. Vibratory intact via tuning fork bilateral. Protective threshold with Semmes Wienstein monofilament intact to all pedal sites bilateral.   Musculoskeletal: There is pain in the  lateral S with ankle course the ATFL and CFL. There is trace edema to the area but is no erythema or increase in warmth. No gross ankle distally present. There is minimal tenderness to the distal portion of the fibula but there is no pain vibratory sensation. There is no pain of the deltoid ligament, syndesmosis or medial ankle. This no pain and fifth metatarsal base or other areas of the foot other than the verruca.  Muscular strength 5/5 in all groups tested bilateral.  Gait: Unassisted, Nonantalgic.     Assessment & Plan:  36 year old female right foot verruca, left hallux onycholysis, left ankle sprain  1. Verruca -Lesions are sharply debrided without, occasions. The areas were cleaned. Cantharone was applied followed by an occlusive bandage. Post procedure insertions were discussed. Monitor for infection.  2. Left hallux onycholysis -Could be results of nail fungus. We'll see if the nail grows back in. Discussed treatment options for onychomycosis but we will observe the nail to see what it looks like as it grows back.  3. Left ankle sprain -X-ray ordered to rule out fracture. -Tri-Lock ankle brace dispensed. -Ice and elevation. -Start rehabilitation exercises as pain decreases.  Follow-up 3 weeks or sooner if needed. Call any questions concerns meantime.  Celesta Gentile, DPM

## 2016-05-26 ENCOUNTER — Telehealth: Payer: Self-pay | Admitting: *Deleted

## 2016-05-26 NOTE — Telephone Encounter (Addendum)
-----   Message from Trula Slade, DPM sent at 05/26/2016  8:20 AM EDT ----- Negative x-ray- please let her know. I informed pt of Dr. Leigh Aurora review of x-rays.

## 2016-05-27 NOTE — Telephone Encounter (Signed)
error 

## 2016-05-31 ENCOUNTER — Ambulatory Visit: Payer: Medicare Other | Admitting: Obstetrics and Gynecology

## 2016-06-13 ENCOUNTER — Ambulatory Visit: Payer: Medicare Other | Admitting: Obstetrics

## 2016-06-14 ENCOUNTER — Encounter: Payer: Self-pay | Admitting: Podiatry

## 2016-06-14 ENCOUNTER — Ambulatory Visit (INDEPENDENT_AMBULATORY_CARE_PROVIDER_SITE_OTHER): Payer: Medicare Other | Admitting: Podiatry

## 2016-06-14 DIAGNOSIS — B070 Plantar wart: Secondary | ICD-10-CM | POA: Diagnosis not present

## 2016-06-14 DIAGNOSIS — S93402D Sprain of unspecified ligament of left ankle, subsequent encounter: Secondary | ICD-10-CM

## 2016-06-14 NOTE — Patient Instructions (Signed)
Take dressing off in 8 hours and wash the foot with soap and water. If it is hurting or becomes uncomfortable before the 8 hours, go ahead and remove the bandage and wash the area.  If it blisters, apply antibiotic ointment and a band-aid.  Monitor for any signs/symptoms of infection. Call the office immediately if any occur or go directly to the emergency room. Call with any questions/concerns.  Have a good rest of your week!   Ankle Sprain, Phase I Rehab Ask your health care provider which exercises are safe for you. Do exercises exactly as told by your health care provider and adjust them as directed. It is normal to feel mild stretching, pulling, tightness, or discomfort as you do these exercises, but you should stop right away if you feel sudden pain or your pain gets worse.Do not begin these exercises until told by your health care provider. Stretching and range of motion exercises These exercises warm up your muscles and joints and improve the movement and flexibility of your lower leg and ankle. These exercises also help to relieve pain and stiffness. Exercise A: Gastroc and soleus stretch   1. Sit on the floor with your left / right leg extended. 2. Loop a belt or towel around the ball of your left / right foot. The ball of your foot is on the walking surface, right under your toes. 3. Keep your left / right ankle and foot relaxed and keep your knee straight while you use the belt or towel to pull your foot toward you. You should feel a gentle stretch behind your calf or knee. 4. Hold this position for __________ seconds, then release to the starting position. Repeat the exercise with your knee bent. You can put a pillow or a rolled bath towel under your knee to support it. You should feel a stretch deep in your calf or at your Achilles tendon. Repeat each stretch __________ times. Complete these stretches __________ times a day. Exercise B: Ankle alphabet   1. Sit with your left /  right leg supported at the lower leg.  Do not rest your foot on anything.  Make sure your foot has room to move freely. 2. Think of your left / right foot as a paintbrush, and move your foot to trace each letter of the alphabet in the air. Keep your hip and knee still while you trace. Make the letters as large as you can without feeling discomfort. 3. Trace every letter from A to Z. Repeat __________ times. Complete this exercise __________ times a day. Strengthening exercises These exercises build strength and endurance in your ankle and lower leg. Endurance is the ability to use your muscles for a long time, even after they get tired. Exercise C: Dorsiflexors   1. Secure a rubber exercise band or tube to an object, such as a table leg, that will stay still when the band is pulled. Secure the other end around your left / right foot. 2. Sit on the floor facing the object, with your left / right leg extended. The band or tube should be slightly tense when your foot is relaxed. 3. Slowly bring your foot toward you, pulling the band tighter. 4. Hold this position for __________ seconds. 5. Slowly return your foot to the starting position. Repeat __________ times. Complete this exercise __________ times a day. Exercise D: Plantar flexors   1. Sit on the floor with your left / right leg extended. 2. Loop a rubber exercise tube  or band around the ball of your left / right foot. The ball of your foot is on the walking surface, right under your toes.  Hold the ends of the band or tube in your hands.  The band or tube should be slightly tense when your foot is relaxed. 3. Slowly point your foot and toes downward, pushing them away from you. 4. Hold this position for __________ seconds. 5. Slowly return your foot to the starting position. Repeat __________ times. Complete this exercise __________ times a day. Exercise E: Evertors  1. Sit on the floor with your legs straight out in front of  you. 2. Loop a rubber exercise band or tube around the ball of your left / right foot. The ball of your foot is on the walking surface, right under your toes.  Hold the ends of the band in your hands, or secure the band to a stable object.  The band or tube should be slightly tense when your foot is relaxed. 3. Slowly push your foot outward, away from your other leg. 4. Hold this position for __________ seconds. 5. Slowly return your foot to the starting position. Repeat __________ times. Complete this exercise __________ times a day. This information is not intended to replace advice given to you by your health care provider. Make sure you discuss any questions you have with your health care provider. Document Released: 09/01/2004 Document Revised: 10/08/2015 Document Reviewed: 12/15/2014 Elsevier Interactive Patient Education  2017 [employer].

## 2016-06-17 DIAGNOSIS — B070 Plantar wart: Secondary | ICD-10-CM | POA: Insufficient documentation

## 2016-06-17 DIAGNOSIS — S93402A Sprain of unspecified ligament of left ankle, initial encounter: Secondary | ICD-10-CM | POA: Insufficient documentation

## 2016-06-17 NOTE — Progress Notes (Signed)
Subjective: 36 year old female presents the office today for follow-up evaluation of verruca to the right foot. She says that there is tenderness and no complications after last treatment. She states of the left ankle was also doing better and she still gets some discomfort at times. She is able to do daily activities or any significant pain however she has pain after prolonged walking. Denies any systemic complaints such as fevers, chills, nausea, vomiting. No acute changes since last appointment, and no other complaints at this time.   Objective: AAO x3, NAD DP/PT pulses palpable bilaterally, CRT less than 3 seconds On the plantar aspect of the right foot there is 2 areas of hyperkeratotic lesions after debridement there is pinpoint bleeding evidence of verruca. There appeared to be improved however. There is no swelling edema, erythema, drainage or pus. There is minimal discomfort on the left ankle on the lateral ankle ligament there still some residual mild swelling but there is no erythema or increase in warmth. There is no gross ankle instability present. No area pinpoint tenderness.  No open lesions or pre-ulcerative lesions.  No pain with calf compression, swelling, warmth, erythema  Assessment: Right foot verruca, left ankle sprain both improved  Plan: -All treatment options discussed with the patient including all alternatives, risks, complications.  -In regards to ankle sprain start range of motion exercises. Also compression sock dispensed today. -Lesions were sharply debrided 2 without complications or bleeding. Area was cleaned and Cantharone was applied followed by an occlusive bandage. Post procedure instructions discussed. -Patient encouraged to call the office with any questions, concerns, change in symptoms.   Celesta Gentile, DPM

## 2016-06-27 ENCOUNTER — Encounter: Payer: Self-pay | Admitting: Family

## 2016-06-27 ENCOUNTER — Telehealth: Payer: Self-pay | Admitting: Family

## 2016-06-27 DIAGNOSIS — G894 Chronic pain syndrome: Secondary | ICD-10-CM

## 2016-06-27 NOTE — Telephone Encounter (Signed)
Patient now requesting [facility] in [place]. Phone number [phone] as an alternative.

## 2016-06-27 NOTE — Telephone Encounter (Signed)
Caller name:Ninfa Relationship to patient: Can be reached: Pharmacy:  Reason for call:Current Pain management dr no longer accepts insurance, requesting referral to [facility] Ph # [phone] fax # [phone]

## 2016-06-27 NOTE — Telephone Encounter (Signed)
Referral placed. Please advise pt that it can take some time to hear back if they will accept her.

## 2016-06-28 NOTE — Telephone Encounter (Signed)
See below. Could you please send referral to [facility] in [place] instead?

## 2016-06-29 NOTE — Telephone Encounter (Signed)
Referral faxed to [facility]

## 2016-07-05 ENCOUNTER — Ambulatory Visit: Payer: Medicare Other | Admitting: Podiatry

## 2016-07-12 ENCOUNTER — Emergency Department (HOSPITAL_BASED_OUTPATIENT_CLINIC_OR_DEPARTMENT_OTHER): Payer: Medicare Other

## 2016-07-12 ENCOUNTER — Telehealth: Payer: Self-pay | Admitting: Family

## 2016-07-12 ENCOUNTER — Emergency Department (HOSPITAL_BASED_OUTPATIENT_CLINIC_OR_DEPARTMENT_OTHER)
Admission: EM | Admit: 2016-07-12 | Discharge: 2016-07-12 | Disposition: A | Discharge status: Home / Self Care | Payer: Medicare Other | Attending: Emergency Medicine | Admitting: Emergency Medicine

## 2016-07-12 ENCOUNTER — Encounter (HOSPITAL_BASED_OUTPATIENT_CLINIC_OR_DEPARTMENT_OTHER): Payer: Self-pay

## 2016-07-12 DIAGNOSIS — J45909 Unspecified asthma, uncomplicated: Secondary | ICD-10-CM | POA: Diagnosis not present

## 2016-07-12 DIAGNOSIS — Z8541 Personal history of malignant neoplasm of cervix uteri: Secondary | ICD-10-CM | POA: Insufficient documentation

## 2016-07-12 DIAGNOSIS — R072 Precordial pain: Secondary | ICD-10-CM | POA: Diagnosis not present

## 2016-07-12 DIAGNOSIS — R079 Chest pain, unspecified: Secondary | ICD-10-CM | POA: Diagnosis present

## 2016-07-12 DIAGNOSIS — F17210 Nicotine dependence, cigarettes, uncomplicated: Secondary | ICD-10-CM | POA: Diagnosis not present

## 2016-07-12 LAB — CBC
HEMATOCRIT: 45.5 % (ref 36.0–46.0)
HEMOGLOBIN: 15.5 g/dL — AB (ref 12.0–15.0)
MCH: 32.4 pg (ref 26.0–34.0)
MCHC: 34.1 g/dL (ref 30.0–36.0)
MCV: 95 fL (ref 78.0–100.0)
Platelets: 227 10*3/uL (ref 150–400)
RBC: 4.79 MIL/uL (ref 3.87–5.11)
RDW: 12.5 % (ref 11.5–15.5)
WBC: 6.5 10*3/uL (ref 4.0–10.5)

## 2016-07-12 LAB — BASIC METABOLIC PANEL
Anion gap: 11 (ref 5–15)
BUN: 11 mg/dL (ref 6–20)
CALCIUM: 9.3 mg/dL (ref 8.9–10.3)
CO2: 23 mmol/L (ref 22–32)
Chloride: 104 mmol/L (ref 101–111)
Creatinine, Ser: 0.77 mg/dL (ref 0.44–1.00)
GFR calc Af Amer: >60 mL/min (ref >60)
GFR calc non Af Amer: >60 mL/min (ref >60)
GLUCOSE: 98 mg/dL (ref 65–99)
POTASSIUM: 3.9 mmol/L (ref 3.5–5.1)
Sodium: 138 mmol/L (ref 135–145)

## 2016-07-12 LAB — TROPONIN I: Troponin I: <0.03 ng/mL (ref <0.03)

## 2016-07-12 MED ORDER — KETOROLAC TROMETHAMINE 30 MG/ML IJ SOLN
30.0000 mg | Freq: Once | INTRAMUSCULAR | Status: AC
  Administered 2016-07-12: 30 mg via INTRAVENOUS
  Filled 2016-07-12: qty 1

## 2016-07-12 MED ORDER — KETOROLAC TROMETHAMINE 60 MG/2ML IM SOLN: 60.0000 mg | Freq: Once | INTRAMUSCULAR | Status: DC

## 2016-07-12 NOTE — Telephone Encounter (Signed)
Caller name: Jaqlyn Gruenhagen Relationship to patient: self Can be reached: [phone]  Reason for call: Pt called in stating she is having pain in her shoulder, chest, and radiating down her arm. Transferred to [facility] with [facility].

## 2016-07-12 NOTE — Telephone Encounter (Signed)
Pt stopped in to see if we needed to check her bp before she goes to ED for chest pain and sob. Informed pt to go to ed for chest pain.

## 2016-07-12 NOTE — ED Triage Notes (Signed)
C/o CP started last night-pt anxious/tearful-states she used albuterol inhaler 10 min PTA with no relief-states she was sent from PCP in the building-was not seen/no EKG PTA

## 2016-07-12 NOTE — ED Provider Notes (Signed)
[facility] Provider Note   CSN: [id number] Arrival date & time: 07/12/16  1621     History   Chief Complaint Chief Complaint  Patient presents with  . Chest Pain    HPI LAKIN RHINE is a 36 y.o. female.  The history is provided by the patient.   Patient presents to the emergency department complaining of ongoing constant chest pain since last night.  States it feels like a pressure and tightness that is not resolved.  She states this feels like it was exacerbated when she found out that her father is undergoing emergency surgery at the [facility] now.  No shortness of breath at this time.  She tried her albuterol inhaler without improvement in her symptoms.  No prior history of cardiac disease.  She does report a family history of a sister who died of an MI at age 49.  She had another family member about a stent before the age of 3.  She had a stress test 6 years ago which was normal per her.  She also reports an echocardiogram within the past several years which demonstrated no significant abnormalities.  Reports the pain is somewhat pleuritic.  No history DVT or PE.  No recent travel or surgery.  No unilateral leg swelling.  Symptoms are moderate in severity   Past Medical History:  Diagnosis Date  . Anxiety    no meds  . Asthma    "very badly" (01/17/2012)  . Cervical cancer ([place]) 1999; 2000  . CHF (congestive heart failure) ([place]) 2006   Resolved -History"with 2006 pregnancy, toxcemia "  not seeing a cardiologist now.  . Chronic back pain   . Chronic bronchitis ([place])    "@ least once/yr" (01/17/2012) - none since 01/2012  . Complication of anesthesia    back surgeries x 6 - fusions T8-S2- rods  . Degenerative disk disease   . Difficult intubation    "needs small tube"  . Exertional dyspnea    Asthma  . Family history of anesthesia complication    Nausea  . GERD (gastroesophageal reflux disease)   . Migraines    last migraine 09/2013  .  Neuromuscular disorder (H[facility])    sciatica  - affecting left leg numb/pain  . Pneumonia    "~ once/yr" (01/17/2012) none since 01/2012  . PONV (postoperative nausea and vomiting)   . Respiratory failure ([place]) 11/2010; 09/2011   Resolved  - History pneumonia    Patient Active Problem List   Diagnosis Date Noted  . Plantar wart 06/17/2016  . Sprain of left ankle 06/17/2016  . Preventative health care 11/06/2014  . Vitamin D deficiency 11/06/2014  . Acquired leg length discrepancy 10/10/2014  . Thoracic postlaminectomy syndrome 10/10/2014  . Postlaminectomy syndrome, lumbar region 10/10/2014  . Disorder of sacroiliac joint 10/10/2014  . Knee swelling 09/09/2014  . Depression 04/16/2014  . Asthma, chronic 04/16/2014  . History of cervical cancer 04/16/2014  . Migraines 04/16/2014  . Scoliosis 03/27/2013  . GERD without esophagitis 06/12/2012  . Degeneration of intervertebral disc, site unspecified 12/28/2011  . Chronic low back pain 10/04/2011  . S/P lumbar spinal fusion 10/04/2011    Past Surgical History:  Procedure Laterality Date  . ADENOIDECTOMY, TONSILLECTOMY AND MYRINGOTOMY WITH TUBE PLACEMENT  1983?  . ANTERIOR LUMBAR FUSION  01/17/2012   Procedure: ANTERIOR LUMBAR FUSION 2 LEVELS;  Surgeon: Lowella Grip, MD;  Location: [facility];  Service: Orthopedics;  Laterality: Bilateral;  Anterior Lumbar 3-4, Lumbar 4-5  fusion with bone graft harvest  . ANTERIOR LUMBAR FUSION    . BACK SURGERY  02/19/2014  . CERVICAL CONE BIOPSY  1999; 2000   cervical cancer  . CESAREAN SECTION N/A 11/08/2013   Procedure: REPEAT CESAREAN SECTION;  Surgeon: Shelly Bombard, MD;  Location: [facility];  Service: Obstetrics;  Laterality: N/A;  . KNEE ARTHROSCOPY  2004 X 2   "left X 2" (01/17/2012)  . LUMBAR DISC SURGERY  06/1998   "L4-5" (01/17/2012)  . LUMBAR FUSION  08/1998; 2010; 2012; 06/2011, 02/19/14   "fusion cage; put rods in; reanchored screws; rod broke" (01/17/2012); Had son in 10/2013 and rods in back  broke.   Marland Kitchen SPINAL FUSION  10/04/2011   Procedure: FUSION POSTERIOR SPINAL MULTILEVEL;  Surgeon: Lowella Grip, MD;  Location: [facility];  Service: Orthopedics;  Laterality: Bilateral;  repair lumbar pseudoarthritis, revision segmental fixation, L3-L4, L4-L5  . SPINAL FUSION  07/06/2015   T7 - S2  . TONSILLECTOMY      OB History    Gravida Para Term Preterm AB Living   3 2 2   1 2    SAB TAB Ectopic Multiple Live Births   1       2       Home Medications    Prior to Admission medications   Medication Sig Start Date End Date Taking? Authorizing Provider  budesonide-formoterol (SYMBICORT) 160-4.5 MCG/ACT inhaler Inhale 2 puffs into the lungs 2 (two) times daily. 12/21/15   Debbrah Alar, NP  buPROPion [facility] SR) 150 MG 12 hr tablet (take one tablet by mouth once daily for first 3 days then increase to twice daily on day 4). 03/06/16   Debbrah Alar, NP  diazepam (VALIUM) 10 MG tablet Take 10 mg by mouth. 03/27/14   [provider]  ergocalciferol (VITAMIN D2) 50000 units capsule Take 1 capsule (50,000 Units total) by mouth once a week. 12/24/15   Debbrah Alar, NP  fluconazole (DIFLUCAN) 150 MG tablet 1 tab by mouth today, may repeat in 3 days if symptoms persist 12/23/15   Debbrah Alar, NP  fluticasone (FLONASE) 50 MCG/ACT nasal spray Place 2 sprays into both nostrils daily. 12/18/15   Saguier, Percell Miller, PA-C  loratadine (CLARITIN) 10 MG tablet TAKE 1 TABLET BY MOUTH DAILY. 09/11/15   Shelly Bombard, MD  meloxicam (MOBIC) 7.5 MG tablet Take 1 tablet (7.5 mg total) by mouth daily. 05/24/16   Trula Slade, DPM  omeprazole (PRILOSEC) 40 MG capsule Take 1 capsule (40 mg total) by mouth daily. 03/06/16   Debbrah Alar, NP  Oxycodone HCl 10 MG TABS Take 10 mg by mouth 4 (four) times daily. 01/08/16   [provider]  PROAIR HFA 108 (90 Base) MCG/ACT inhaler INHALE 2 PUFFS INTO THE LUNGS EVERY 6 HOURS AS NEEDED FOR WHEEZING OR SHORTNESS OF  BREATH. 02/16/16   Debbrah Alar, NP  promethazine (PHENERGAN) 50 MG tablet Take 1 tablet (50 mg total) by mouth every 6 (six) hours as needed for nausea or vomiting. 12/23/15   Debbrah Alar, NP  rizatriptan (MAXALT) 5 MG tablet TAKE 1 TABLET BY MOUTH AT ONSET OF MIGRAINE, MAY REPEAT 2 HOURS LATER AS NEEDED. MAX 2 TABLETS PER DAY. 12/12/14   Debbrah Alar, NP    Family History Family History  Problem Relation Age of Onset  . COPD Mother   . Heart disease Father   . Hypertension Father   . Diabetes Father        ESRD on dialysis  .  Deep vein thrombosis Father   . Cancer Unknown   . Heart attack Sister     Social History Social History  Substance Use Topics  . Smoking status: Current Every Day Smoker    Years: 15.00    Types: Cigarettes    Last attempt to quit: 04/14/2013  . Smokeless tobacco: Never Used  . Alcohol use No     Allergies   Iodine; Ultram [tramadol]; and Other   Review of Systems Review of Systems  All other systems reviewed and are negative.    Physical Exam Updated Vital Signs BP 96/60   Pulse 80   Temp 99.5 F (37.5 C) (Oral)   Resp 18   Ht 5\' 3"  (1.6 m)   Wt 77.6 kg (171 lb)   SpO2 98%   BMI 30.29 kg/m   Physical Exam  Constitutional: She is oriented to person, place, and time. She appears well-developed and well-nourished. No distress.  HENT:  Head: Normocephalic and atraumatic.  Eyes: EOM are normal.  Neck: Normal range of motion.  Cardiovascular: Normal rate, regular rhythm and normal heart sounds.   Pulmonary/Chest: Effort normal and breath sounds normal.  Abdominal: Soft. She exhibits no distension. There is no tenderness.  Musculoskeletal: Normal range of motion. She exhibits no edema or tenderness.  Neurological: She is alert and oriented to person, place, and time.  Skin: Skin is warm and dry.  Psychiatric: Judgment normal.  Anxious appearing  Nursing note and vitals reviewed.    ED Treatments / Results    Labs (all labs ordered are listed, but only abnormal results are displayed) Labs Reviewed  CBC - Abnormal; Notable for the following:       Result Value   Hemoglobin 15.5 (*)    All other components within normal limits  BASIC METABOLIC PANEL  TROPONIN I    EKG  EKG Interpretation #1  Date/Time:  Tuesday Jul 12 2016 16:28:30 EDT Ventricular Rate:  99 PR Interval:  166 QRS Duration: 82 QT Interval:  338 QTC Calculation: 433 R Axis:   93 Text Interpretation:  Normal sinus rhythm Rightward axis No significant change was found Confirmed by Jola Schmidt [phone]) on 07/12/2016 4:57:06 PM        EKG Interpretation #2  Date/Time:  Tuesday Jul 12 2016 18:42:05 EDT Ventricular Rate:  76 PR Interval:  166 QRS Duration: 85 QT Interval:  367 QTC Calculation: 413 R Axis:   92 Text Interpretation:  Sinus rhythm Consider left atrial enlargement Borderline right axis deviation No significant change was found Confirmed by Jola Schmidt [phone]) on 07/12/2016 7:03:22 PM        Radiology Dg Chest 2 View  Result Date: 07/12/2016 CLINICAL DATA:  LEFT-sided chest pain radiates down LEFT upper extremity EXAM: CHEST  2 VIEW COMPARISON:  None. FINDINGS: Normal heart. No effusion, infiltrate pneumothorax. Posterior thorax have lumbar fusion IMPRESSION: No active cardiopulmonary disease. Electronically Signed   By: Suzy Bouchard M.D.   On: 07/12/2016 17:41    Procedures Procedures (including critical care time)  Medications Ordered in ED Medications  ketorolac (TORADOL) 30 MG/ML injection 30 mg (30 mg Intravenous Given 07/12/16 1712)     Initial Impression / Assessment and Plan / ED Course  I have reviewed the triage vital signs and the nursing notes.  Pertinent labs & imaging results that were available during my care of the patient were reviewed by me and considered in my medical decision making (see chart for details).  Patient overall well-appearing.  Doubt PE.  Doubt ACS.   EKG without ischemic changes 2.  Outpatient cardiology follow-up given her family history.  Patient's had constant pain of 41 troponin is reasonable.  Final Clinical Impressions(s) / ED Diagnoses   Final diagnoses:  Precordial pain    New Prescriptions New Prescriptions   No medications on file     Jola Schmidt, MD 07/12/16 [phone]

## 2016-07-13 ENCOUNTER — Other Ambulatory Visit: Payer: Self-pay | Admitting: Family

## 2016-07-14 NOTE — Telephone Encounter (Signed)
Requesting Diflucan 150mg -As directed. Last ordered:12/23/15;#2,0 Last OV:2//21/18-has an appt sch'ed for (07/18/16).  Called and spoke with the pt and she stated that she is having vaginal discharge(thick and light creamy),and itching.  Pt states she have had the sxs x 3 days.  Please advise.//AB/CMA

## 2016-07-15 NOTE — Telephone Encounter (Signed)
rx sent for diflucan, needs OV if symptoms worsen or do not improve.

## 2016-07-15 NOTE — Telephone Encounter (Signed)
Left detailed message on pt's voicemail re: Rx authorization.

## 2016-07-15 NOTE — Telephone Encounter (Signed)
Debbrah Alar, NP      12:51 PM  Note    rx sent for diflucan, needs OV if symptoms worsen or do not improve.

## 2016-07-15 NOTE — Telephone Encounter (Signed)
See 07/13/16 refill note.

## 2016-07-18 ENCOUNTER — Encounter: Payer: Self-pay | Admitting: Family

## 2016-07-18 ENCOUNTER — Ambulatory Visit (INDEPENDENT_AMBULATORY_CARE_PROVIDER_SITE_OTHER): Payer: Medicare Other | Admitting: Family

## 2016-07-18 VITALS — BP 111/67 | HR 100 | Temp 98.5°F | Resp 18 | Ht 63.0 in | Wt 173.6 lb

## 2016-07-18 DIAGNOSIS — B070 Plantar wart: Secondary | ICD-10-CM | POA: Diagnosis not present

## 2016-07-18 DIAGNOSIS — R0789 Other chest pain: Secondary | ICD-10-CM

## 2016-07-18 NOTE — Progress Notes (Signed)
Subjective:    Patient ID: [name], female    DOB: [birth year], 36 y.o.   MRN: [id number]  HPI  [name] is a 36 yr old female who presents today for ER follow up. ER record is reviewed. She presented to the ED on 07/12/16 with c/o chest pain. EKG's were unremarkable and Troponin was negative. CXR showed NAD.  She reports that she had 1-2 days of left shoulder/chest aching which has since resolved.   Wart- requesting follow up treatment for plantar wart. Review of Systems See HPI  Past Medical History:  Diagnosis Date  . Anxiety    no meds  . Asthma    "very badly" (01/17/2012)  . Cervical cancer ([place]) 1999; 2000  . CHF (congestive heart failure) ([place]) 2006   Resolved -History"with 2006 pregnancy, toxcemia "  not seeing a cardiologist now.  . Chronic back pain   . Chronic bronchitis ([place])    "@ least once/yr" (01/17/2012) - none since 01/2012  . Complication of anesthesia    back surgeries x 6 - fusions T8-S2- rods  . Degenerative disk disease   . Difficult intubation    "needs small tube"  . Exertional dyspnea    Asthma  . Family history of anesthesia complication    Nausea  . GERD (gastroesophageal reflux disease)   . Migraines    last migraine 09/2013  . Neuromuscular disorder (HCC)    sciatica  - affecting left leg numb/pain  . Pneumonia    "~ once/yr" (01/17/2012) none since 01/2012  . PONV (postoperative nausea and vomiting)   . Respiratory failure ([place]) 11/2010; 09/2011   Resolved  - History pneumonia     Social History   Social History  . Marital status: Married    Spouse name: N/A  . Number of children: 1  . Years of education: N/A   Occupational History  . Disabled    Social History Main Topics  . Smoking status: Current Every Day Smoker    Years: 15.00    Types: Cigarettes    Last attempt to quit: 04/14/2013  . Smokeless tobacco: Never Used     Comment: 3-4 cigarettes a day  . Alcohol use No  . Drug use: Unknown  . Sexual  activity: Not on file   Other Topics Concern  . Not on file   Social History Narrative   Married, 2 children   disabled    Past Surgical History:  Procedure Laterality Date  . ADENOIDECTOMY, TONSILLECTOMY AND MYRINGOTOMY WITH TUBE PLACEMENT  1983?  . ANTERIOR LUMBAR FUSION  01/17/2012   Procedure: ANTERIOR LUMBAR FUSION 2 LEVELS;  Surgeon: Lowella Grip, MD;  Location: [facility];  Service: Orthopedics;  Laterality: Bilateral;  Anterior Lumbar 3-4, Lumbar 4-5 fusion with bone graft harvest  . ANTERIOR LUMBAR FUSION    . BACK SURGERY  02/19/2014  . CERVICAL CONE BIOPSY  1999; 2000   cervical cancer  . CESAREAN SECTION N/A 11/08/2013   Procedure: REPEAT CESAREAN SECTION;  Surgeon: Shelly Bombard, MD;  Location: [facility];  Service: Obstetrics;  Laterality: N/A;  . KNEE ARTHROSCOPY  2004 X 2   "left X 2" (01/17/2012)  . LUMBAR DISC SURGERY  06/1998   "L4-5" (01/17/2012)  . LUMBAR FUSION  08/1998; 2010; 2012; 06/2011, 02/19/14   "fusion cage; put rods in; reanchored screws; rod broke" (01/17/2012); Had son in 10/2013 and rods in back broke.   Marland Kitchen SPINAL FUSION  10/04/2011   Procedure: FUSION POSTERIOR  SPINAL MULTILEVEL;  Surgeon: Lowella Grip, MD;  Location: [facility];  Service: Orthopedics;  Laterality: Bilateral;  repair lumbar pseudoarthritis, revision segmental fixation, L3-L4, L4-L5  . SPINAL FUSION  07/06/2015   T7 - S2  . TONSILLECTOMY      Family History  Problem Relation Age of Onset  . COPD Mother   . Heart disease Father   . Hypertension Father   . Diabetes Father        ESRD on dialysis  . Deep vein thrombosis Father   . Cancer Unknown   . Heart attack Sister     Allergies  Allergen Reactions  . Iodine Itching and Rash  . Ultram [Tramadol] Itching  . Other Rash    Compound Tincture of Benzoin     Current Outpatient Prescriptions on File Prior to Visit  Medication Sig Dispense Refill  . budesonide-formoterol (SYMBICORT) 160-4.5 MCG/ACT inhaler Inhale 2 puffs into  the lungs 2 (two) times daily. 1 Inhaler 5  . buPROPion (WELLBUTRIN SR) 150 MG 12 hr tablet (take one tablet by mouth once daily for first 3 days then increase to twice daily on day 4). 60 tablet 1  . diazepam (VALIUM) 10 MG tablet Take 10 mg by mouth.    . fluticasone (FLONASE) 50 MCG/ACT nasal spray Place 2 sprays into both nostrils daily. 16 g 1  . loratadine (CLARITIN) 10 MG tablet TAKE 1 TABLET BY MOUTH DAILY. 30 tablet 11  . omeprazole (PRILOSEC) 40 MG capsule Take 1 capsule (40 mg total) by mouth daily. 30 capsule 5  . Oxycodone HCl 10 MG TABS Take 10 mg by mouth 4 (four) times daily.  0  . PROAIR HFA 108 (90 Base) MCG/ACT inhaler INHALE 2 PUFFS INTO THE LUNGS EVERY 6 HOURS AS NEEDED FOR WHEEZING OR SHORTNESS OF BREATH. 8.5 g 2  . promethazine (PHENERGAN) 50 MG tablet Take 1 tablet (50 mg total) by mouth every 6 (six) hours as needed for nausea or vomiting. 15 tablet 0  . rizatriptan (MAXALT) 5 MG tablet TAKE 1 TABLET BY MOUTH AT ONSET OF MIGRAINE, MAY REPEAT 2 HOURS LATER AS NEEDED. MAX 2 TABLETS PER DAY. 10 tablet 1   No current facility-administered medications on file prior to visit.     BP 111/67 (BP Location: Right Arm, Cuff Size: Large)   Pulse 100   Temp 98.5 F (36.9 C) (Oral)   Resp 18   Ht 5\' 3"  (1.6 m)   Wt 173 lb 9.6 oz (78.7 kg)   SpO2 99%   BMI 30.75 kg/m       Objective:   Physical Exam  Constitutional: She is oriented to person, place, and time. She appears well-developed and well-nourished.  Cardiovascular: Normal rate, regular rhythm and normal heart sounds.   No murmur heard. Pulmonary/Chest: Effort normal and breath sounds normal. No respiratory distress. She has no wheezes.  Musculoskeletal: She exhibits no edema.  Neurological: She is alert and oriented to person, place, and time.  Skin: Skin is warm and dry.  Plantar wart/callous noted base of right toe (plantar surface).   Psychiatric: She has a normal mood and affect. Her behavior is normal.  Judgment and thought content normal.          Assessment & Plan:  Plantar wart- after verbal consent, wart debrided with sterile blade, then frozen x 3 with liquid nitrogen. Pt tolerated procedure well. Advised as follows:  Apply OTC wart remover to wart once daily and cover with duct tape.  File dead skin down with an emory board or pumice and be sure to wear gloves/wash hands.    Atypical CP- resolved Will refer to cardiology for further risk factor stratification/testing.

## 2016-07-18 NOTE — Patient Instructions (Signed)
Apply OTC wart remover to wart once daily and cover with duct tape. File dead skin down with an emory board or pumice and be sure to wear gloves/wash hands.   You will be contacted about your referral to cardiology.

## 2016-07-25 ENCOUNTER — Encounter: Payer: Self-pay | Admitting: Physician Assistant

## 2016-08-08 NOTE — Progress Notes (Deleted)
Cardiology Office Note    Date:  08/08/2016   ID:  RAYEL SANTIZO, DOB 05-02-1980, MRN [id number]  PCP:  Debbrah Alar, NP  Cardiologist:  New to   Chief Complaint: Chest pain   History of Present Illness:   [name] is a 36 y.o. female with prior pregnancy related cardiomyopathy 2006 (follwed by Dr. Doylene Canard at that time) refereed by Debbrah Alar, NP for evaluation of chest pain .   Last echo 07/2007 showed LVEF fo 55%, no WM abnormality.   Recently seen in ER 5/29 and by PCP 6/4  for atypical chest pain. Work up was negative - personally reviewed.   Past Medical History:  Diagnosis Date  . Anxiety    no meds  . Asthma    "very badly" (01/17/2012)  . Cervical cancer ([place]) 1999; 2000  . CHF (congestive heart failure) ([place]) 2006   Resolved -History"with 2006 pregnancy, toxcemia "  not seeing a cardiologist now.  . Chronic back pain   . Chronic bronchitis ([place])    "@ least once/yr" (01/17/2012) - none since 01/2012  . Complication of anesthesia    back surgeries x 6 - fusions T8-S2- rods  . Degenerative disk disease   . Difficult intubation    "needs small tube"  . Exertional dyspnea    Asthma  . Family history of anesthesia complication    Nausea  . GERD (gastroesophageal reflux disease)   . Migraines    last migraine 09/2013  . Neuromuscular disorder (HCC)    sciatica  - affecting left leg numb/pain  . Pneumonia    "~ once/yr" (01/17/2012) none since 01/2012  . PONV (postoperative nausea and vomiting)   . Respiratory failure ([place]) 11/2010; 09/2011   Resolved  - History pneumonia    Past Surgical History:  Procedure Laterality Date  . ADENOIDECTOMY, TONSILLECTOMY AND MYRINGOTOMY WITH TUBE PLACEMENT  1983?  . ANTERIOR LUMBAR FUSION  01/17/2012   Procedure: ANTERIOR LUMBAR FUSION 2 LEVELS;  Surgeon: Lowella Grip, MD;  Location: [facility];  Service: Orthopedics;  Laterality: Bilateral;  Anterior Lumbar 3-4, Lumbar 4-5 fusion with bone  graft harvest  . ANTERIOR LUMBAR FUSION    . BACK SURGERY  02/19/2014  . CERVICAL CONE BIOPSY  1999; 2000   cervical cancer  . CESAREAN SECTION N/A 11/08/2013   Procedure: REPEAT CESAREAN SECTION;  Surgeon: Shelly Bombard, MD;  Location: [facility];  Service: Obstetrics;  Laterality: N/A;  . KNEE ARTHROSCOPY  2004 X 2   "left X 2" (01/17/2012)  . LUMBAR DISC SURGERY  06/1998   "L4-5" (01/17/2012)  . LUMBAR FUSION  08/1998; 2010; 2012; 06/2011, 02/19/14   "fusion cage; put rods in; reanchored screws; rod broke" (01/17/2012); Had son in 10/2013 and rods in back broke.   Marland Kitchen SPINAL FUSION  10/04/2011   Procedure: FUSION POSTERIOR SPINAL MULTILEVEL;  Surgeon: Lowella Grip, MD;  Location: [facility];  Service: Orthopedics;  Laterality: Bilateral;  repair lumbar pseudoarthritis, revision segmental fixation, L3-L4, L4-L5  . SPINAL FUSION  07/06/2015   T7 - S2  . TONSILLECTOMY      Current Medications: Prior to Admission medications   Medication Sig Start Date End Date Taking? Authorizing Provider  budesonide-formoterol (SYMBICORT) 160-4.5 MCG/ACT inhaler Inhale 2 puffs into the lungs 2 (two) times daily. 12/21/15   Debbrah Alar, NP  buPROPion (WELLBUTRIN SR) 150 MG 12 hr tablet (take one tablet by mouth once daily for first 3 days then increase to twice daily  on day 4). 03/06/16   Debbrah Alar, NP  Cholecalciferol (VITAMIN D PO) Take 500 mg by mouth 2 (two) times daily.    [provider]  diazepam (VALIUM) 10 MG tablet Take 10 mg by mouth. 03/27/14   [provider]  fluticasone (FLONASE) 50 MCG/ACT nasal spray Place 2 sprays into both nostrils daily. 12/18/15   Saguier, Percell Miller, PA-C  loratadine (CLARITIN) 10 MG tablet TAKE 1 TABLET BY MOUTH DAILY. 09/11/15   Shelly Bombard, MD  omeprazole (PRILOSEC) 40 MG capsule Take 1 capsule (40 mg total) by mouth daily. 03/06/16   Debbrah Alar, NP  Oxycodone HCl 10 MG TABS Take 10 mg by mouth 4 (four) times daily. 01/08/16    [provider]  PROAIR HFA 108 (90 Base) MCG/ACT inhaler INHALE 2 PUFFS INTO THE LUNGS EVERY 6 HOURS AS NEEDED FOR WHEEZING OR SHORTNESS OF BREATH. 02/16/16   Debbrah Alar, NP  promethazine (PHENERGAN) 50 MG tablet Take 1 tablet (50 mg total) by mouth every 6 (six) hours as needed for nausea or vomiting. 12/23/15   Debbrah Alar, NP  rizatriptan (MAXALT) 5 MG tablet TAKE 1 TABLET BY MOUTH AT ONSET OF MIGRAINE, MAY REPEAT 2 HOURS LATER AS NEEDED. MAX 2 TABLETS PER DAY. 12/12/14   Debbrah Alar, NP    Allergies:   Iodine; Ultram [tramadol]; and Other   Social History   Social History  . Marital status: Married    Spouse name: N/A  . Number of children: 1  . Years of education: N/A   Occupational History  . Disabled    Social History Main Topics  . Smoking status: Current Every Day Smoker    Years: 15.00    Types: Cigarettes    Last attempt to quit: 04/14/2013  . Smokeless tobacco: Never Used     Comment: 3-4 cigarettes a day  . Alcohol use No  . Drug use: No  . Sexual activity: Not on file   Other Topics Concern  . Not on file   Social History Narrative   Married, 2 children   disabled     Family History:  The patient's family history includes COPD in her mother; Deep vein thrombosis in her father; Diabetes in her father; Heart attack in her sister; Heart disease in her father; Hypertension in her father. ***  ROS:   Please see the history of present illness.    ROS All other systems reviewed and are negative.   PHYSICAL EXAM:   VS:  There were no vitals taken for this visit.   GEN: Well nourished, well developed, in no acute distress  HEENT: normal  Neck: no JVD, carotid bruits, or masses Cardiac: ***RRR; no murmurs, rubs, or gallops,no edema  Respiratory:  clear to auscultation bilaterally, normal work of breathing GI: soft, nontender, nondistended, + BS MS: no deformity or atrophy  Skin: warm and dry, no rash Neuro:  Alert and Oriented x  3, Strength and sensation are intact Psych: euthymic mood, full affect  Wt Readings from Last 3 Encounters:  07/18/16 173 lb 9.6 oz (78.7 kg)  07/12/16 171 lb (77.6 kg)  04/06/16 182 lb 3.2 oz (82.6 kg)      Studies/Labs Reviewed:   EKG:  EKG is not ordered today.   EKG 07/12/16 - NSR - personally reviewed.   Recent Labs: 07/12/2016: BUN 11; Creatinine, Ser 0.77; Hemoglobin 15.5; Platelets 227; Potassium 3.9; Sodium 138   Lipid Panel    Component Value Date/Time   CHOL  184 11/05/2014 1025   TRIG 89.0 11/05/2014 1025   HDL 41.00 11/05/2014 1025   CHOLHDL 4 11/05/2014 1025   VLDL 17.8 11/05/2014 1025   LDLCALC 125 (H) 11/05/2014 1025    Additional studies/ records that were reviewed today include:   Echocardiogram:  Cardiac Catheterization:     ASSESSMENT & PLAN:    1. ***    Medication Adjustments/Labs and Tests Ordered: Current medicines are reviewed at length with the patient today.  Concerns regarding medicines are outlined above.  Medication changes, Labs and Tests ordered today are listed in the Patient Instructions below. There are no Patient Instructions on file for this visit.   Jarrett Soho, [place]  08/08/2016 2:40 PM    [facility] [address], [place], [place]  [address] Phone: [phone]; Fax: [phone]

## 2016-08-11 ENCOUNTER — Ambulatory Visit: Payer: Medicare Other | Admitting: Physician Assistant

## 2016-08-18 NOTE — Progress Notes (Signed)
Cardiology Office Note   Date:  08/20/2016   ID:  [name], DOB 26-May-1980, MRN [id number]  PCP:  Debbrah Alar, NP  Cardiologist:   Minus Breeding, MD  Referring:  Debbrah Alar, NP  Chief Complaint  Patient presents with  . Chest Pain      History of Present Illness: [name] is a 36 y.o. female who was referred by Debbrah Alar, NP for evaluation of chest pain.  she was in the ED in late May for this year.  I reviewed these records for this visit.  There was no evidence of a cardiac etiology.  It was thought to be musculoskeletal.     She reports that she was having a lot of stress as her father was ill. He actually died in 2022-08-22.  She described chest discomfort. This happens sporadically. Perhaps 2 times per week. It's intermittent chest and down her left arm. She feels some tingling in her fingers. She'll get dizzy. It might be 7 out of 10 in intensity. It might last for 5-30 minutes. She will have a cold sweat. It goes away by itself. She doesn't bring it on with activity but she's very limited by back pain. She also has some palpitations. This happens not infrequently. She also has shortness of breath daily.  I was able to find some old records from 2006. She had a slightly reduced ejection fraction of 40-50% which was thought to be peripartum cardiomyopathy. However, follow-up echocardiogram 2009 was low normal.  She had a subsequent pregnancy 2 years ago without complications.     Past Medical History:  Diagnosis Date  . Anxiety    no meds  . Asthma    "very badly" (01/17/2012)  . Cervical cancer ([place]) 1999; 2000  . CHF (congestive heart failure) ([place]) 2006   Resolved -History"with 2006 pregnancy, toxcemia "  not seeing a cardiologist now.  . Chronic back pain   . Chronic bronchitis ([place])    "@ least once/yr" (01/17/2012) - none since 01/2012  . Complication of anesthesia    back surgeries x 6 - fusions T8-S2- rods  .  Degenerative disk disease   . Difficult intubation    "needs small tube"  . Family history of anesthesia complication    Nausea  . GERD (gastroesophageal reflux disease)   . Migraines    last migraine 09/2013  . Neuromuscular disorder (HCC)    sciatica  - affecting left leg numb/pain  . Pneumonia    "~ once/yr" (01/17/2012) none since 01/2012  . PONV (postoperative nausea and vomiting)   . Respiratory failure ([place]) 11/2010; 09/2011   Resolved  - History pneumonia    Past Surgical History:  Procedure Laterality Date  . ADENOIDECTOMY, TONSILLECTOMY AND MYRINGOTOMY WITH TUBE PLACEMENT  1983?  . ANTERIOR LUMBAR FUSION  01/17/2012   Procedure: ANTERIOR LUMBAR FUSION 2 LEVELS;  Surgeon: Lowella Grip, MD;  Location: [facility];  Service: Orthopedics;  Laterality: Bilateral;  Anterior Lumbar 3-4, Lumbar 4-5 fusion with bone graft harvest  . ANTERIOR LUMBAR FUSION    . BACK SURGERY  02/19/2014  . CERVICAL CONE BIOPSY  1999; 2000   cervical cancer  . CESAREAN SECTION N/A 11/08/2013   Procedure: REPEAT CESAREAN SECTION;  Surgeon: Shelly Bombard, MD;  Location: [facility];  Service: Obstetrics;  Laterality: N/A;  . KNEE ARTHROSCOPY  2004 X 2   "left X 2" (01/17/2012)  . LUMBAR DISC SURGERY  06/1998   "L4-5" (01/17/2012)  .  LUMBAR FUSION  08/1998; 2010; 2012; 06/2011, 02/19/14   "fusion cage; put rods in; reanchored screws; rod broke" (01/17/2012); Had son in 10/2013 and rods in back broke.   Marland Kitchen SPINAL FUSION  10/04/2011   Procedure: FUSION POSTERIOR SPINAL MULTILEVEL;  Surgeon: Lowella Grip, MD;  Location: [facility];  Service: Orthopedics;  Laterality: Bilateral;  repair lumbar pseudoarthritis, revision segmental fixation, L3-L4, L4-L5  . SPINAL FUSION  07/06/2015   T7 - S2  . TONSILLECTOMY       Current Outpatient Prescriptions  Medication Sig Dispense Refill  . budesonide-formoterol (SYMBICORT) 160-4.5 MCG/ACT inhaler Inhale 2 puffs into the lungs 2 (two) times daily. 1 Inhaler 5  . buPROPion  (WELLBUTRIN SR) 150 MG 12 hr tablet (take one tablet by mouth once daily for first 3 days then increase to twice daily on day 4). 60 tablet 1  . Cholecalciferol (VITAMIN D PO) Take 500 mg by mouth 2 (two) times daily.    . diazepam (VALIUM) 10 MG tablet Take 10 mg by mouth.    . fluticasone (FLONASE) 50 MCG/ACT nasal spray Place 2 sprays into both nostrils daily. 16 g 1  . loratadine (CLARITIN) 10 MG tablet TAKE 1 TABLET BY MOUTH DAILY. 30 tablet 11  . omeprazole (PRILOSEC) 40 MG capsule Take 1 capsule (40 mg total) by mouth daily. 30 capsule 5  . Oxycodone HCl 10 MG TABS Take 10 mg by mouth 4 (four) times daily.  0  . PROAIR HFA 108 (90 Base) MCG/ACT inhaler INHALE 2 PUFFS INTO THE LUNGS EVERY 6 HOURS AS NEEDED FOR WHEEZING OR SHORTNESS OF BREATH. 8.5 g 2  . promethazine (PHENERGAN) 50 MG tablet Take 1 tablet (50 mg total) by mouth every 6 (six) hours as needed for nausea or vomiting. 15 tablet 0  . rizatriptan (MAXALT) 5 MG tablet TAKE 1 TABLET BY MOUTH AT ONSET OF MIGRAINE, MAY REPEAT 2 HOURS LATER AS NEEDED. MAX 2 TABLETS PER DAY. 10 tablet 1   No current facility-administered medications for this visit.     Allergies:   Iodine; Ultram [tramadol]; and Other    Social History:  The patient  reports that she has been smoking Cigarettes.  She has smoked for the past 15.00 years. She has never used smokeless tobacco. She reports that she does not drink alcohol or use drugs.   Family History:  The patient's family history includes COPD in her mother; Deep vein thrombosis in her father; Diabetes in her father; Heart attack (age of onset: 31) in her sister; Heart disease (age of onset: 51) in her father; Hypertension in her father.    ROS:  Please see the history of present illness.   Otherwise, review of systems are positive for none.   All other systems are reviewed and negative.    PHYSICAL EXAM: VS:  BP 102/70   Pulse 83   Ht 5\' 3"  (1.6 m)   Wt 176 lb (79.8 kg)   BMI 31.18 kg/m  , BMI  Body mass index is 31.18 kg/m. GENERAL:  Well appearing HEENT:  Pupils equal round and reactive, fundi not visualized, oral mucosa unremarkable NECK:  No jugular venous distention, waveform within normal limits, carotid upstroke brisk and symmetric, no bruits, no thyromegaly LYMPHATICS:  No cervical, inguinal adenopathy LUNGS:  Clear to auscultation bilaterally BACK:  No CVA tenderness CHEST:  Unremarkable HEART:  PMI not displaced or sustained,S1 and S2 within normal limits, no S3, no S4, no clicks, no rubs, no murmurs ABD:  Flat, positive bowel sounds normal in frequency in pitch, no bruits, no rebound, no guarding, positive midline pulsatile mass, no hepatomegaly, no splenomegaly EXT:  2 plus pulses throughout, no edema, no cyanosis no clubbing SKIN:  No rashes no nodules NEURO:  Cranial nerves II through XII grossly intact, motor grossly intact throughout PSYCH:  Cognitively intact, oriented to person place and time    EKG:  EKG is ordered today. The ekg ordered today demonstrates Sinus rhythm, rate 83, axis within normal limits, possible left atrial enlargement, no changes.   Recent Labs: 07/12/2016: BUN 11; Creatinine, Ser 0.77; Hemoglobin 15.5; Platelets 227; Potassium 3.9; Sodium 138    Lipid Panel    Component Value Date/Time   CHOL 184 11/05/2014 1025   TRIG 89.0 11/05/2014 1025   HDL 41.00 11/05/2014 1025   CHOLHDL 4 11/05/2014 1025   VLDL 17.8 11/05/2014 1025   LDLCALC 125 (H) 11/05/2014 1025      Wt Readings from Last 3 Encounters:  08/19/16 176 lb (79.8 kg)  07/18/16 173 lb 9.6 oz (78.7 kg)  07/12/16 171 lb (77.6 kg)      Other studies Reviewed: Additional studies/ records that were reviewed today include: Old records as above. Review of the above records demonstrates:  Please see elsewhere in the note.     ASSESSMENT AND PLAN:   CHEST PAIN:  Chest pain is atypical. However, there is a family history. I like to try to do a POET (Plain Old Exercise  Treadmill).  I know this might be difficult with her back pain but she wants to give it a try.  AAA:  She does have a pulsatile abdominal aorta below the umbilicus. I will order abdominal ultrasound.  Current medicines are reviewed at length with the patient today.  The patient does not have concerns regarding medicines.  The following changes have been made:  no change  Labs/ tests ordered today include:   Orders Placed This Encounter  Procedures  . Exercise Tolerance Test  . EKG 12-Lead     Disposition:   FU with me as needed    Signed, Minus Breeding, MD  08/20/2016 8:24 PM    [facility]

## 2016-08-19 ENCOUNTER — Encounter: Payer: Self-pay | Admitting: Cardiology

## 2016-08-19 ENCOUNTER — Ambulatory Visit (INDEPENDENT_AMBULATORY_CARE_PROVIDER_SITE_OTHER): Payer: Medicare Other | Admitting: Cardiology

## 2016-08-19 VITALS — BP 102/70 | HR 83 | Ht 63.0 in | Wt 176.0 lb

## 2016-08-19 DIAGNOSIS — I714 Abdominal aortic aneurysm, without rupture, unspecified: Secondary | ICD-10-CM

## 2016-08-19 DIAGNOSIS — R079 Chest pain, unspecified: Secondary | ICD-10-CM

## 2016-08-19 NOTE — Patient Instructions (Signed)
Medication Instructions:  Your physician recommends that you continue on your current medications as directed. Please refer to the Current Medication list given to you today.  Labwork: None   Testing/Procedures: Your physician has requested that you have an abdominal aorta duplex. During this test, an ultrasound is used to evaluate the aorta. Allow 30 minutes for this exam. Do not eat after midnight the day before and avoid carbonated beverages  Your physician has requested that you have an exercise tolerance test. For further information please visit [email or web address]. Please also follow instruction sheet, as given.   Follow-Up: [facility] RECOMMENDS THAT YOU FOLLOW UP WITH THE OFFICE AS NEEDED.  Any Other Special Instructions Will Be Listed Below (If Applicable).     If you need a refill on your cardiac medications before your next appointment, please call your pharmacy.

## 2016-08-20 ENCOUNTER — Encounter: Payer: Self-pay | Admitting: Cardiology

## 2016-08-20 DIAGNOSIS — I714 Abdominal aortic aneurysm, without rupture, unspecified: Secondary | ICD-10-CM | POA: Insufficient documentation

## 2016-08-20 DIAGNOSIS — R079 Chest pain, unspecified: Secondary | ICD-10-CM | POA: Insufficient documentation

## 2016-08-22 ENCOUNTER — Other Ambulatory Visit: Payer: Self-pay | Admitting: Family

## 2016-08-23 ENCOUNTER — Telehealth (HOSPITAL_COMMUNITY): Payer: Self-pay | Admitting: Cardiology

## 2016-08-29 NOTE — Telephone Encounter (Signed)
Called pt and lmsg for her to CB to sch ETT.

## 2016-08-30 ENCOUNTER — Encounter: Payer: Self-pay | Admitting: Obstetrics

## 2016-08-30 ENCOUNTER — Other Ambulatory Visit (HOSPITAL_COMMUNITY)
Admission: RE | Admit: 2016-08-30 | Discharge: 2016-08-30 | Disposition: A | Discharge status: Home / Self Care | Payer: Medicare Other | Source: Ambulatory Visit | Attending: Obstetrics | Admitting: Obstetrics

## 2016-08-30 ENCOUNTER — Ambulatory Visit (INDEPENDENT_AMBULATORY_CARE_PROVIDER_SITE_OTHER): Payer: Medicare Other | Admitting: Obstetrics

## 2016-08-30 VITALS — BP 124/79 | HR 90 | Ht 63.0 in | Wt 183.2 lb

## 2016-08-30 DIAGNOSIS — Z124 Encounter for screening for malignant neoplasm of cervix: Secondary | ICD-10-CM | POA: Diagnosis not present

## 2016-08-30 DIAGNOSIS — T8332XA Displacement of intrauterine contraceptive device, initial encounter: Secondary | ICD-10-CM | POA: Insufficient documentation

## 2016-08-30 DIAGNOSIS — Z3202 Encounter for pregnancy test, result negative: Secondary | ICD-10-CM

## 2016-08-30 DIAGNOSIS — Z30431 Encounter for routine checking of intrauterine contraceptive device: Secondary | ICD-10-CM

## 2016-08-30 DIAGNOSIS — Z113 Encounter for screening for infections with a predominantly sexual mode of transmission: Secondary | ICD-10-CM

## 2016-08-30 DIAGNOSIS — N898 Other specified noninflammatory disorders of vagina: Secondary | ICD-10-CM

## 2016-08-30 DIAGNOSIS — X58XXXA Exposure to other specified factors, initial encounter: Secondary | ICD-10-CM | POA: Diagnosis not present

## 2016-08-30 DIAGNOSIS — Z01419 Encounter for gynecological examination (general) (routine) without abnormal findings: Secondary | ICD-10-CM | POA: Diagnosis present

## 2016-08-30 LAB — POCT URINE PREGNANCY: Preg Test, Ur: NEGATIVE

## 2016-08-30 NOTE — Progress Notes (Signed)
Subjective:        [name] is a 36 y.o. female here for a routine exam.  Current complaints: Hair falling out and growing facial hair.  Concerned that this may be caused by IUD.  Has had a Mirena IUD in for ~ 3 years.    Personal health questionnaire:  Is patient [name], have a family history of breast and/or ovarian cancer: no Is there a family history of uterine cancer diagnosed at age < 23, gastrointestinal cancer, urinary tract cancer, family member who is a Field seismologist syndrome-associated carrier: no Is the patient overweight and hypertensive, family history of diabetes, personal history of gestational diabetes, preeclampsia or PCOS: no Is patient over 17, have PCOS,  family history of premature CHD under age 89, diabetes, smoke, have hypertension or peripheral artery disease:  no At any time, has a partner hit, kicked or otherwise hurt or frightened you?: no Over the past 2 weeks, have you felt down, depressed or hopeless?: no Over the past 2 weeks, have you felt little interest or pleasure in doing things?:no   Gynecologic History No LMP recorded. Patient is not currently having periods (Reason: IUD). Contraception: IUD Last Pap: 2015. Results were: normal Last mammogram: n/a. Results were: n/a  Obstetric History OB History  Gravida Para Term Preterm AB Living  3 2 2   1 2   SAB TAB Ectopic Multiple Live Births  1       2    # Outcome Date GA Lbr Len/2nd Weight Sex Delivery Anes PTL Lv  3 Term 11/08/13 [redacted]w[redacted]d  6 lb 4.9 oz (2.86 kg) M CS-LTranv Gen  LIV  2 Term 09/19/04 [redacted]w[redacted]d  6 lb 6 oz (2.892 kg) M CS-LTranv EPI  LIV  1 SAB 1996 [redacted]w[redacted]d             Past Medical History:  Diagnosis Date  . Anxiety    no meds  . Asthma    "very badly" (01/17/2012)  . Cervical cancer ([place]) 1999; 2000  . CHF (congestive heart failure) ([place]) 2006   Resolved -History"with 2006 pregnancy, toxcemia "  not seeing a cardiologist now.  . Chronic back pain   . Chronic  bronchitis ([place])    "@ least once/yr" (01/17/2012) - none since 01/2012  . Complication of anesthesia    back surgeries x 6 - fusions T8-S2- rods  . Degenerative disk disease   . Difficult intubation    "needs small tube"  . Family history of anesthesia complication    Nausea  . GERD (gastroesophageal reflux disease)   . Migraines    last migraine 09/2013  . Neuromuscular disorder (HCC)    sciatica  - affecting left leg numb/pain  . Pneumonia    "~ once/yr" (01/17/2012) none since 01/2012  . PONV (postoperative nausea and vomiting)   . Respiratory failure ([place]) 11/2010; 09/2011   Resolved  - History pneumonia    Past Surgical History:  Procedure Laterality Date  . ADENOIDECTOMY, TONSILLECTOMY AND MYRINGOTOMY WITH TUBE PLACEMENT  1983?  . ANTERIOR LUMBAR FUSION  01/17/2012   Procedure: ANTERIOR LUMBAR FUSION 2 LEVELS;  Surgeon: Crystal Grip, MD;  Location: [facility];  Service: Orthopedics;  Laterality: Bilateral;  Anterior Lumbar 3-4, Lumbar 4-5 fusion with bone graft harvest  . ANTERIOR LUMBAR FUSION    . BACK SURGERY  02/19/2014  . CERVICAL CONE BIOPSY  1999; 2000   cervical cancer  . CESAREAN SECTION N/A 11/08/2013   Procedure: REPEAT CESAREAN  SECTION;  Surgeon: Crystal Bombard, MD;  Location: [facility];  Service: Obstetrics;  Laterality: N/A;  . KNEE ARTHROSCOPY  2004 X 2   "left X 2" (01/17/2012)  . LUMBAR DISC SURGERY  06/1998   "L4-5" (01/17/2012)  . LUMBAR FUSION  08/1998; 2010; 2012; 06/2011, 02/19/14   "fusion cage; put rods in; reanchored screws; rod broke" (01/17/2012); Had son in 10/2013 and rods in back broke.   Marland Kitchen SPINAL FUSION  10/04/2011   Procedure: FUSION POSTERIOR SPINAL MULTILEVEL;  Surgeon: Crystal Grip, MD;  Location: [facility];  Service: Orthopedics;  Laterality: Bilateral;  repair lumbar pseudoarthritis, revision segmental fixation, L3-L4, L4-L5  . SPINAL FUSION  07/06/2015   T7 - S2  . TONSILLECTOMY       Current Outpatient Prescriptions:  .   budesonide-formoterol (SYMBICORT) 160-4.5 MCG/ACT inhaler, Inhale 2 puffs into the lungs 2 (two) times daily., Disp: 1 Inhaler, Rfl: 5 .  buPROPion (WELLBUTRIN SR) 150 MG 12 hr tablet, TAKE 1 TABLET BY MOUTH ONCE DAILY FOR FIRST 3 DAYS THEN INCREASE TO TWICE A DAY., Disp: 60 tablet, Rfl: 5 .  Cholecalciferol (VITAMIN D PO), Take 500 mg by mouth 2 (two) times daily., Disp: , Rfl:  .  diazepam (VALIUM) 10 MG tablet, Take 10 mg by mouth., Disp: , Rfl:  .  fluticasone (FLONASE) 50 MCG/ACT nasal spray, Place 2 sprays into both nostrils daily., Disp: 16 g, Rfl: 1 .  loratadine (CLARITIN) 10 MG tablet, TAKE 1 TABLET BY MOUTH DAILY., Disp: 30 tablet, Rfl: 11 .  omeprazole (PRILOSEC) 40 MG capsule, Take 1 capsule (40 mg total) by mouth daily., Disp: 30 capsule, Rfl: 5 .  Oxycodone HCl 10 MG TABS, Take 10 mg by mouth 4 (four) times daily., Disp: , Rfl: 0 .  PROAIR HFA 108 (90 Base) MCG/ACT inhaler, INHALE 2 PUFFS INTO THE LUNGS EVERY 6 HOURS AS NEEDED FOR WHEEZING OR SHORTNESS OF BREATH., Disp: 8.5 g, Rfl: 2 .  promethazine (PHENERGAN) 50 MG tablet, Take 1 tablet (50 mg total) by mouth every 6 (six) hours as needed for nausea or vomiting., Disp: 15 tablet, Rfl: 0 .  rizatriptan (MAXALT) 5 MG tablet, TAKE 1 TABLET BY MOUTH AT ONSET OF MIGRAINE, MAY REPEAT 2 HOURS LATER AS NEEDED. MAX 2 TABLETS PER DAY., Disp: 10 tablet, Rfl: 1 Allergies  Allergen Reactions  . Iodine Itching and Rash    Pt states she has used iodine and has had no reaction, she is allergic to steri strips. 08-30-16  . Ultram [Tramadol] Itching  . Other Rash    Compound Tincture of Benzoin     Social History  Substance Use Topics  . Smoking status: Current Every Day Smoker    Years: 15.00    Types: Cigarettes    Last attempt to quit: 04/14/2013  . Smokeless tobacco: Never Used     Comment: 3-4 cigarettes a day  . Alcohol use No    Family History  Problem Relation Age of Onset  . COPD Mother   . Breast cancer Mother   . Heart disease  Father 90  . Hypertension Father   . Diabetes Father        ESRD on dialysis  . Deep vein thrombosis Father   . Lung cancer Father   . Cancer Unknown   . Heart attack Sister 38       Died with MI      Review of Systems  Constitutional: negative for fatigue and weight loss Respiratory:  negative for cough and wheezing Cardiovascular: negative for chest pain, fatigue and palpitations Gastrointestinal: negative for abdominal pain and change in bowel habits Musculoskeletal:negative for myalgias Neurological: negative for gait problems and tremors Behavioral/Psych: negative for abusive relationship, depression Endocrine: negative for temperature intolerance    Genitourinary:negative for abnormal menstrual periods, genital lesions, hot flashes, sexual problems and vaginal discharge Integument/breast: negative for breast lump, breast tenderness, nipple discharge and skin lesion(s)    Objective:       BP 124/79   Pulse 90   Ht 5\' 3"  (1.6 m)   Wt 183 lb 3.2 oz (83.1 kg)   BMI 32.45 kg/m  General:   alert  Skin:   no rash or abnormalities  Lungs:   clear to auscultation bilaterally  Heart:   regular rate and rhythm, S1, S2 normal, no murmur, click, rub or gallop  Breasts:   normal without suspicious masses, skin or nipple changes or axillary nodes  Abdomen:  normal findings: no organomegaly, soft, non-tender and no hernia  Pelvis:  External genitalia: normal general appearance Urinary system: urethral meatus normal and bladder without fullness, nontender Vaginal: normal without tenderness, induration or masses Cervix: normal appearance.  IUD string not visible Adnexa: normal bimanual exam Uterus: anteverted and non-tender, normal size   Lab Review Urine pregnancy test Labs reviewed yes Radiologic studies reviewed yes  50% of 20 min visit spent on counseling and coordination of care.    Assessment:    Healthy female exam.    Plan:   1. Encounter for routine  gynecological examination with Papanicolaou smear of cervix Rx: - Cytology - PAP  2. IUD check up Rx: - US Pelvis Complete; Future - US Transvaginal Non-OB; Future  3. Intrauterine contraceptive device threads lost, initial encounter Rx: - US Pelvis Complete; Future - US Transvaginal Non-OB; Future  4. Vaginal discharge Rx: - Cervicovaginal ancillary only   Education reviewed: calcium supplements, depression evaluation, low fat, low cholesterol diet, self breast exams, smoking cessation and weight bearing exercise. Contraception: IUD. Follow up in: 2 weeks.   No orders of the defined types were placed in this encounter.  Orders Placed This Encounter  Procedures  . US Pelvis Complete    Standing Status:   Future    Standing Expiration Date:   10/31/2017    Order Specific Question:   Reason for Exam (SYMPTOM  OR DIAGNOSIS REQUIRED)    Answer:   IUD strings lost    Order Specific Question:   Preferred imaging location?    Answer:   [facility]  . US Transvaginal Non-OB    Standing Status:   Future    Standing Expiration Date:   10/31/2017    Order Specific Question:   Reason for Exam (SYMPTOM  OR DIAGNOSIS REQUIRED)    Answer:   IUD strings lost    Order Specific Question:   Preferred imaging location?    Answer:   [facility]     Patient ID: [name], female   DOB: [birth year], 36 y.o.   MRN: [id number]

## 2016-08-30 NOTE — Progress Notes (Signed)
Patient is in the office for annual exam, last pap 04-10-13. Pt currently has IUD, but wants to discuss possible other BC options.

## 2016-08-31 LAB — CERVICOVAGINAL ANCILLARY ONLY
Chlamydia: NEGATIVE
Neisseria Gonorrhea: NEGATIVE

## 2016-09-02 LAB — CYTOLOGY - PAP
Diagnosis: NEGATIVE
HPV (WINDOPATH): NOT DETECTED

## 2016-09-06 ENCOUNTER — Ambulatory Visit (HOSPITAL_COMMUNITY): Payer: Medicare Other

## 2016-09-08 ENCOUNTER — Telehealth: Payer: Self-pay

## 2016-09-08 ENCOUNTER — Ambulatory Visit (HOSPITAL_COMMUNITY): Admission: RE | Admit: 2016-09-08 | Payer: Medicare Other | Source: Ambulatory Visit

## 2016-09-08 NOTE — Telephone Encounter (Signed)
Returned call, pt wanted to know if she needs to still have u/s because she can now feel her IUD strings. Notified provider and advised pt not to have u/s and to keep scheduled appt for 09-13-16. Pt agreed and stated that she would like to have Paraguard IUD.

## 2016-09-13 ENCOUNTER — Ambulatory Visit: Payer: Medicare Other | Admitting: Obstetrics

## 2016-09-14 ENCOUNTER — Telehealth (HOSPITAL_COMMUNITY): Payer: Self-pay

## 2016-09-14 NOTE — Telephone Encounter (Signed)
Encounter complete. 

## 2016-09-16 ENCOUNTER — Ambulatory Visit (HOSPITAL_COMMUNITY)
Admission: RE | Admit: 2016-09-16 | Payer: Medicare Other | Source: Ambulatory Visit | Attending: Cardiology | Admitting: Cardiology

## 2016-09-21 ENCOUNTER — Ambulatory Visit (HOSPITAL_COMMUNITY): Payer: Medicare Other

## 2016-09-26 ENCOUNTER — Ambulatory Visit (HOSPITAL_COMMUNITY): Payer: Medicare Other | Attending: Obstetrics

## 2016-09-30 ENCOUNTER — Telehealth (HOSPITAL_COMMUNITY): Payer: Self-pay | Admitting: Cardiology

## 2016-09-30 NOTE — Telephone Encounter (Signed)
User: Cherie Dark A Date/time: 09/30/2016 9:26 AM  Comment: Called pt and lmsg for her to CB to r/s ETT.   Context: [place] Schedule Orders/Appt Requests Outcome: Left Message  Phone number: [phone] Phone Type: Home Phone  Comm. type: Telephone Call type: Outgoing  Contact: Atayde, Dorina J Relation to patient: Self  Letter:      User: Cherie Dark A Date/time: 09/19/2016 1:12 PM  Comment: Called pt to try to r/s ETT that was missed on 8/3 and was unable to leave a message b/c mailbox is full.   Context: [facility] Schedule Orders/Appt Requests Outcome: Not Available  Phone number: [phone] Phone Type: Home Phone  Comm. type: Telephone Call type: Outgoing  Contact: Wombles, Revia J Relation to patient: Self  Letter:      User: Cherie Dark A Date/time: 08/23/2016 10:59 AM  Comment: Called pt and lmsg for her to CB to get scheduled for ETT.   Context: [facility] Schedule Orders/Appt Requests Outcome: Left Message  Phone number: [phone] Phone Type: Home Phone  Comm. type: Telephone Call type: Outgoing  Contact: Liou, Sebrina J Relation to patient: Self  Letter:             She will be removed from the workqueue.

## 2016-10-04 ENCOUNTER — Ambulatory Visit: Payer: Medicare Other | Admitting: Obstetrics

## 2016-11-09 ENCOUNTER — Ambulatory Visit: Payer: Self-pay | Admitting: Family

## 2016-11-09 ENCOUNTER — Telehealth: Payer: Self-pay | Admitting: Family

## 2016-11-09 NOTE — Telephone Encounter (Signed)
No charge. 

## 2016-11-09 NOTE — Telephone Encounter (Signed)
Pt came in for her appt today on time but was informed provider was running behind for 40 mins and if it was ok with her, pt stated since she came in with a baby that she was taken care off, that she could not wait and decided to rescheduled for Friday. Please do not charge no show fee.

## 2016-11-11 ENCOUNTER — Encounter: Payer: Self-pay | Admitting: Family

## 2016-11-11 ENCOUNTER — Ambulatory Visit (INDEPENDENT_AMBULATORY_CARE_PROVIDER_SITE_OTHER): Payer: Medicare Other | Admitting: Family

## 2016-11-11 VITALS — BP 104/51 | HR 84 | Temp 99.1°F | Resp 16 | Ht 63.0 in | Wt 192.0 lb

## 2016-11-11 DIAGNOSIS — L659 Nonscarring hair loss, unspecified: Secondary | ICD-10-CM

## 2016-11-11 DIAGNOSIS — L680 Hirsutism: Secondary | ICD-10-CM

## 2016-11-11 DIAGNOSIS — K047 Periapical abscess without sinus: Secondary | ICD-10-CM | POA: Diagnosis not present

## 2016-11-11 LAB — TESTOSTERONE: TESTOSTERONE: 36 ng/dL (ref 15.00–40.00)

## 2016-11-11 LAB — TSH: TSH: 1.59 u[IU]/mL (ref 0.35–4.50)

## 2016-11-11 MED ORDER — AMOXICILLIN-POT CLAVULANATE 875-125 MG PO TABS: 1.0000 | ORAL_TABLET | Freq: Two times a day (BID) | ORAL | 0 refills | Status: DC

## 2016-11-11 NOTE — Patient Instructions (Signed)
Please complete lab work prior to leaving. Start augmentin for dental abscess. Try to get in with a dentist as soon as possible. Call if increased pain/swelling or fever.

## 2016-11-11 NOTE — Progress Notes (Signed)
Subjective:    Patient ID: [name], female    DOB: 07/21/1980, 36 y.o.   MRN: [id number]  HPI  [name] is a 36 yr old female who presents today with c/o dental abscess.  Reports that she has had pain x 1 month, now having swelling.  Reports that she cannot currently afford dental insurance. She hopes to have dental care after the first of the year. She reports that the area is painful.   Reports hair falling out and some facial hair. Reports that her GYN recommended that she have a testosterone level drawn at her primary care for insurance purposes. She's been having issues with hair loss and some hirsutism.  Reports some mild URI symptoms, son has been sick.  Review of Systems See HPI  Past Medical History:  Diagnosis Date  . Anxiety    no meds  . Asthma    "very badly" (01/17/2012)  . Cervical cancer ([place]) 1999; 2000  . CHF (congestive heart failure) ([place]) 2006   Resolved -History"with 2006 pregnancy, toxcemia "  not seeing a cardiologist now.  . Chronic back pain   . Chronic bronchitis ([place])    "@ least once/yr" (01/17/2012) - none since 01/2012  . Complication of anesthesia    back surgeries x 6 - fusions T8-S2- rods  . Degenerative disk disease   . Difficult intubation    "needs small tube"  . Family history of anesthesia complication    Nausea  . GERD (gastroesophageal reflux disease)   . Migraines    last migraine 09/2013  . Neuromuscular disorder (HCC)    sciatica  - affecting left leg numb/pain  . Pneumonia    "~ once/yr" (01/17/2012) none since 01/2012  . PONV (postoperative nausea and vomiting)   . Respiratory failure ([place]) 11/2010; 09/2011   Resolved  - History pneumonia     Social History   Social History  . Marital status: Married    Spouse name: N/A  . Number of children: 1  . Years of education: N/A   Occupational History  . Disabled    Social History Main Topics  . Smoking status: Current Every Day Smoker    Years: 15.00   Types: Cigarettes    Last attempt to quit: 04/14/2013  . Smokeless tobacco: Never Used     Comment: 3-4 cigarettes a day  . Alcohol use No  . Drug use: No  . Sexual activity: Yes    Partners: Male    Birth control/ protection: IUD   Other Topics Concern  . Not on file   Social History Narrative   Married, 2 children   disabled    Past Surgical History:  Procedure Laterality Date  . ADENOIDECTOMY, TONSILLECTOMY AND MYRINGOTOMY WITH TUBE PLACEMENT  1983?  . ANTERIOR LUMBAR FUSION  01/17/2012   Procedure: ANTERIOR LUMBAR FUSION 2 LEVELS;  Surgeon: Lowella Grip, MD;  Location: [facility];  Service: Orthopedics;  Laterality: Bilateral;  Anterior Lumbar 3-4, Lumbar 4-5 fusion with bone graft harvest  . ANTERIOR LUMBAR FUSION    . BACK SURGERY  02/19/2014  . CERVICAL CONE BIOPSY  1999; 2000   cervical cancer  . CESAREAN SECTION N/A 11/08/2013   Procedure: REPEAT CESAREAN SECTION;  Surgeon: Shelly Bombard, MD;  Location: [facility];  Service: Obstetrics;  Laterality: N/A;  . KNEE ARTHROSCOPY  2004 X 2   "left X 2" (01/17/2012)  . LUMBAR DISC SURGERY  06/1998   "L4-5" (01/17/2012)  . LUMBAR  FUSION  08/1998; 2010; 2012; 06/2011, 02/19/14   "fusion cage; put rods in; reanchored screws; rod broke" (01/17/2012); Had son in 10/2013 and rods in back broke.   Marland Kitchen SPINAL FUSION  10/04/2011   Procedure: FUSION POSTERIOR SPINAL MULTILEVEL;  Surgeon: Lowella Grip, MD;  Location: [facility];  Service: Orthopedics;  Laterality: Bilateral;  repair lumbar pseudoarthritis, revision segmental fixation, L3-L4, L4-L5  . SPINAL FUSION  07/06/2015   T7 - S2  . TONSILLECTOMY      Family History  Problem Relation Age of Onset  . COPD Mother   . Breast cancer Mother   . Heart disease Father 63  . Hypertension Father   . Diabetes Father        ESRD on dialysis  . Deep vein thrombosis Father   . Lung cancer Father   . Cancer Unknown   . Heart attack Sister 58       Died with MI    Allergies  Allergen  Reactions  . Iodine Itching and Rash    Pt states she has used iodine and has had no reaction, she is allergic to steri strips. 08-30-16  . Ultram [Tramadol] Itching  . Other Rash    Compound Tincture of Benzoin     Current Outpatient Prescriptions on File Prior to Visit  Medication Sig Dispense Refill  . budesonide-formoterol (SYMBICORT) 160-4.5 MCG/ACT inhaler Inhale 2 puffs into the lungs 2 (two) times daily. 1 Inhaler 5  . buPROPion (WELLBUTRIN SR) 150 MG 12 hr tablet TAKE 1 TABLET BY MOUTH ONCE DAILY FOR FIRST 3 DAYS THEN INCREASE TO TWICE A DAY. 60 tablet 5  . Cholecalciferol (VITAMIN D PO) Take 500 mg by mouth 2 (two) times daily.    . diazepam (VALIUM) 10 MG tablet Take 10 mg by mouth.    . fluticasone (FLONASE) 50 MCG/ACT nasal spray Place 2 sprays into both nostrils daily. 16 g 1  . loratadine (CLARITIN) 10 MG tablet TAKE 1 TABLET BY MOUTH DAILY. 30 tablet 11  . omeprazole (PRILOSEC) 40 MG capsule Take 1 capsule (40 mg total) by mouth daily. 30 capsule 5  . Oxycodone HCl 10 MG TABS Take 10 mg by mouth 4 (four) times daily.  0  . PROAIR HFA 108 (90 Base) MCG/ACT inhaler INHALE 2 PUFFS INTO THE LUNGS EVERY 6 HOURS AS NEEDED FOR WHEEZING OR SHORTNESS OF BREATH. 8.5 g 2  . promethazine (PHENERGAN) 50 MG tablet Take 1 tablet (50 mg total) by mouth every 6 (six) hours as needed for nausea or vomiting. 15 tablet 0  . rizatriptan (MAXALT) 5 MG tablet TAKE 1 TABLET BY MOUTH AT ONSET OF MIGRAINE, MAY REPEAT 2 HOURS LATER AS NEEDED. MAX 2 TABLETS PER DAY. 10 tablet 1   No current facility-administered medications on file prior to visit.     BP (!) 104/51 (BP Location: Right Arm, Cuff Size: Large)   Pulse 84   Temp 99.1 F (37.3 C) (Oral)   Resp 16   Ht 5\' 3"  (1.6 m)   Wt 192 lb (87.1 kg)   SpO2 97%   BMI 34.01 kg/m       Objective:   Physical Exam  Constitutional: She is oriented to person, place, and time. She appears well-developed and well-nourished.  HENT:  Head:  Normocephalic and atraumatic.  Right Ear: Tympanic membrane and ear canal normal.  Left Ear: Tympanic membrane and ear canal normal.  Mouth/Throat: No oropharyngeal exudate, posterior oropharyngeal edema or posterior oropharyngeal erythema.  Tooth 10 is noted to have some breakage, to K, and surrounding erythema of the gumline  Cardiovascular: Normal rate, regular rhythm and normal heart sounds.   No murmur heard. Pulmonary/Chest: Effort normal and breath sounds normal. No respiratory distress. She has no wheezes.  Musculoskeletal: She exhibits no edema.  Neurological: She is alert and oriented to person, place, and time.  Psychiatric: She has a normal mood and affect. Her behavior is normal. Judgment and thought content normal.          Assessment & Plan:  Dental abscess-a prescription has been provided for Augmentin. I did suggest she contact the [facility] to see about getting reduced cost dental work done. She could also try contacting [facility]. I believe they do some low cost extractions. She is advised that the Augmentin is not a cure that she will ultimately need further dental care. She verbalizes understanding.  Hirsutism-will obtain testosterone level. Will also obtain a TSH due to complaint of hair loss. She did have a CBC done a few months back which was within normal limits.

## 2016-11-13 ENCOUNTER — Encounter: Payer: Self-pay | Admitting: Family

## 2016-11-14 ENCOUNTER — Telehealth: Payer: Self-pay | Admitting: Family

## 2016-11-14 DIAGNOSIS — G8929 Other chronic pain: Secondary | ICD-10-CM

## 2016-11-14 DIAGNOSIS — M545 Low back pain, unspecified: Secondary | ICD-10-CM

## 2016-11-14 NOTE — Telephone Encounter (Signed)
Pt says that she need a new referral to pain management. She said that she currently have one but they are not covered by her insurance. She said that she would like to be set up with office /location below. They are covered.     [facility] - Phone: [phone] Dr. Brandy Hale

## 2016-11-14 NOTE — Telephone Encounter (Signed)
Will need new referral

## 2016-11-24 NOTE — Telephone Encounter (Signed)
Per staff at [facility], pt is scheduled with Dr Brandy Hale 12/19/16 at 4:00 pm. They notified pt of appt.

## 2016-12-07 ENCOUNTER — Other Ambulatory Visit: Payer: Self-pay | Admitting: Family

## 2016-12-12 ENCOUNTER — Other Ambulatory Visit: Payer: Self-pay | Admitting: Family

## 2017-01-26 ENCOUNTER — Telehealth: Payer: Self-pay | Admitting: *Deleted

## 2017-01-26 NOTE — Telephone Encounter (Signed)
Pt called requesting Chantix to help her stop smoking. States she took this years ago and tolerated it well. Was able to stop smoking at the time but restarted sometime later.  Please advise?

## 2017-01-26 NOTE — Telephone Encounter (Signed)
Pt called stating she received a statement for a no show charge from [facility] 11/09/16. Pt was not a no show and there is a phone note reflecting the circumstance and notation not to charge no show fee from 11/09/16. Pt is requesting that we have this charge removed and call her when that has been taken care of.

## 2017-01-27 MED ORDER — VARENICLINE TARTRATE 0.5 MG X 11 & 1 MG X 42 PO MISC: ORAL | 0 refills | Status: DC

## 2017-01-27 NOTE — Telephone Encounter (Signed)
See rx.  Could you please fax to her pharmacy?  I would like to see her back in the office prior to refill for follow up. Call if mood changes after starting chantix.

## 2017-01-27 NOTE — Telephone Encounter (Signed)
Attempted to reach pt and left detailed message to call back and schedule follow up before next refill is due. Rx faxed to pharmacy.

## 2017-01-31 NOTE — Telephone Encounter (Signed)
Sent message to charge correction for fee to be removed for patient

## 2017-02-24 ENCOUNTER — Ambulatory Visit: Payer: Medicare Other | Admitting: Family

## 2017-02-24 DIAGNOSIS — Z0289 Encounter for other administrative examinations: Secondary | ICD-10-CM

## 2017-03-13 DIAGNOSIS — M545 Low back pain: Secondary | ICD-10-CM | POA: Diagnosis not present

## 2017-03-13 DIAGNOSIS — G894 Chronic pain syndrome: Secondary | ICD-10-CM | POA: Diagnosis not present

## 2017-03-13 DIAGNOSIS — Z79891 Long term (current) use of opiate analgesic: Secondary | ICD-10-CM | POA: Diagnosis not present

## 2017-04-10 DIAGNOSIS — Z79891 Long term (current) use of opiate analgesic: Secondary | ICD-10-CM | POA: Diagnosis not present

## 2017-04-10 DIAGNOSIS — G894 Chronic pain syndrome: Secondary | ICD-10-CM | POA: Diagnosis not present

## 2017-04-10 DIAGNOSIS — M545 Low back pain: Secondary | ICD-10-CM | POA: Diagnosis not present

## 2017-04-27 ENCOUNTER — Other Ambulatory Visit: Payer: Self-pay | Admitting: Family

## 2017-04-27 DIAGNOSIS — Z9889 Other specified postprocedural states: Secondary | ICD-10-CM | POA: Diagnosis not present

## 2017-04-27 DIAGNOSIS — M545 Low back pain: Secondary | ICD-10-CM | POA: Diagnosis not present

## 2017-04-27 DIAGNOSIS — R200 Anesthesia of skin: Secondary | ICD-10-CM | POA: Diagnosis not present

## 2017-04-27 DIAGNOSIS — Z981 Arthrodesis status: Secondary | ICD-10-CM | POA: Diagnosis not present

## 2017-05-02 ENCOUNTER — Telehealth: Payer: Self-pay | Admitting: *Deleted

## 2017-05-02 NOTE — Telephone Encounter (Signed)
Received fax from [facility] that pt called on 04/30/17 stating she started having: 102.3 fever, cough, sneezing on 3/17 and daughter was previously diagnosed with the flu. She is asking for Rx of Tamiflu?

## 2017-05-03 NOTE — Telephone Encounter (Signed)
Left detailed message on pt's voicemail regarding below recommendation and to call if any further questions/concerns.

## 2017-05-03 NOTE — Telephone Encounter (Signed)
Advised pt of below. 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 Her child's pediatrician recommended she contact us for Tamiflu. Pt states that she called oncall service because the pediatrician told her there was a 51 to 72 hour window for treatment and pt is disappointed that we are just now contacting her and she is unable to leave her sick children.

## 2017-05-03 NOTE — Telephone Encounter (Signed)
I would recommend OV please for further evaluation.

## 2017-05-03 NOTE — Telephone Encounter (Signed)
Given her high fever I think she needs to be seen by a provider. Pneumonia is a possibility which would require antibiotics.  If she is unable to be seen during regular business hours, I would recommend that she go to an urgent care later today.

## 2017-05-08 DIAGNOSIS — Z79891 Long term (current) use of opiate analgesic: Secondary | ICD-10-CM | POA: Diagnosis not present

## 2017-05-08 DIAGNOSIS — M545 Low back pain: Secondary | ICD-10-CM | POA: Diagnosis not present

## 2017-05-08 DIAGNOSIS — G894 Chronic pain syndrome: Secondary | ICD-10-CM | POA: Diagnosis not present

## 2017-06-01 DIAGNOSIS — M549 Dorsalgia, unspecified: Secondary | ICD-10-CM | POA: Diagnosis not present

## 2017-06-01 DIAGNOSIS — M4046 Postural lordosis, lumbar region: Secondary | ICD-10-CM | POA: Diagnosis not present

## 2017-06-01 DIAGNOSIS — Z9889 Other specified postprocedural states: Secondary | ICD-10-CM | POA: Diagnosis not present

## 2017-06-01 DIAGNOSIS — M545 Low back pain: Secondary | ICD-10-CM | POA: Diagnosis not present

## 2017-06-01 DIAGNOSIS — G8929 Other chronic pain: Secondary | ICD-10-CM | POA: Diagnosis not present

## 2017-06-01 DIAGNOSIS — M4184 Other forms of scoliosis, thoracic region: Secondary | ICD-10-CM | POA: Diagnosis not present

## 2017-06-01 DIAGNOSIS — Z981 Arthrodesis status: Secondary | ICD-10-CM | POA: Diagnosis not present

## 2017-06-07 DIAGNOSIS — Z79891 Long term (current) use of opiate analgesic: Secondary | ICD-10-CM | POA: Diagnosis not present

## 2017-06-07 DIAGNOSIS — M545 Low back pain: Secondary | ICD-10-CM | POA: Diagnosis not present

## 2017-06-07 DIAGNOSIS — G894 Chronic pain syndrome: Secondary | ICD-10-CM | POA: Diagnosis not present

## 2017-06-07 DIAGNOSIS — M79604 Pain in right leg: Secondary | ICD-10-CM | POA: Diagnosis not present

## 2017-06-22 ENCOUNTER — Other Ambulatory Visit: Payer: Self-pay

## 2017-06-22 ENCOUNTER — Telehealth: Payer: Self-pay | Admitting: Family

## 2017-06-22 MED ORDER — PROAIR HFA 108 (90 BASE) MCG/ACT IN AERS: INHALATION_SPRAY | RESPIRATORY_TRACT | 2 refills | Status: DC

## 2017-06-22 NOTE — Telephone Encounter (Signed)
Patient came by the office, refill for inhaler was sent in to her pharmacy.

## 2017-06-22 NOTE — Telephone Encounter (Signed)
Pt made an apt for tomorrow and wants to know if she can get her inhale refilled now or have to wait til tomorrow,  She is out.

## 2017-06-23 ENCOUNTER — Telehealth: Payer: Self-pay

## 2017-06-23 ENCOUNTER — Ambulatory Visit (INDEPENDENT_AMBULATORY_CARE_PROVIDER_SITE_OTHER): Payer: Medicare HMO | Admitting: Family

## 2017-06-23 ENCOUNTER — Encounter: Payer: Self-pay | Admitting: Family

## 2017-06-23 VITALS — BP 116/74 | HR 86 | Temp 98.4°F | Resp 16 | Ht 63.0 in | Wt 207.0 lb

## 2017-06-23 DIAGNOSIS — K219 Gastro-esophageal reflux disease without esophagitis: Secondary | ICD-10-CM | POA: Diagnosis not present

## 2017-06-23 DIAGNOSIS — G43909 Migraine, unspecified, not intractable, without status migrainosus: Secondary | ICD-10-CM

## 2017-06-23 DIAGNOSIS — F329 Major depressive disorder, single episode, unspecified: Secondary | ICD-10-CM | POA: Diagnosis not present

## 2017-06-23 DIAGNOSIS — Z720 Tobacco use: Secondary | ICD-10-CM | POA: Diagnosis not present

## 2017-06-23 DIAGNOSIS — J45909 Unspecified asthma, uncomplicated: Secondary | ICD-10-CM | POA: Diagnosis not present

## 2017-06-23 DIAGNOSIS — F32A Depression, unspecified: Secondary | ICD-10-CM

## 2017-06-23 MED ORDER — VARENICLINE TARTRATE 0.5 MG X 11 & 1 MG X 42 PO MISC: ORAL | 0 refills | Status: DC

## 2017-06-23 MED ORDER — VENTOLIN HFA 108 (90 BASE) MCG/ACT IN AERS: 2.0000 | INHALATION_SPRAY | Freq: Four times a day (QID) | RESPIRATORY_TRACT | 5 refills | Status: DC | PRN

## 2017-06-23 MED ORDER — ESOMEPRAZOLE MAGNESIUM 40 MG PO CPDR: 40.0000 mg | DELAYED_RELEASE_CAPSULE | Freq: Every day | ORAL | 5 refills | Status: DC

## 2017-06-23 MED ORDER — SUMATRIPTAN SUCCINATE 50 MG PO TABS: 50.0000 mg | ORAL_TABLET | ORAL | 5 refills | Status: DC | PRN

## 2017-06-23 MED ORDER — CLOBETASOL PROPIONATE 0.05 % EX OINT: 1.0000 "application " | TOPICAL_OINTMENT | Freq: Two times a day (BID) | CUTANEOUS | 1 refills | Status: DC

## 2017-06-23 NOTE — Progress Notes (Signed)
Subjective:    Patient ID: [name], female    DOB: [birth year], 37 y.o.   MRN: [id number]  HPI   Patient is a 37 yr old female who presents today for follow up.  1) Asthma- reports that there symptoms have been a bit worse due to pollen. She is using allergy medication as well. She continues symbicort and using albuterol prn.    2) c/o scalp itching- Started about 2.5 months ago. She has used otc Tgel without improvement.  Notes that it is constantly itching/flaking.    3) GERD-continues omeprazole.  Notes that nexium helped more than omeprazole.   4) migraines- reports that she has had migraines about 2 times a week.   5) Tobacco abuse- reports that she she quit briefly then chantix ran out and she restarted.   6) Depression- reports depression is stable. Continues to grieve the loss of her father but feels that her grieving is appropriate.   Review of Systems See HPI  Past Medical History:  Diagnosis Date  . Anxiety    no meds  . Asthma    "very badly" (01/17/2012)  . Cervical cancer ([place]) 1999; 2000  . CHF (congestive heart failure) ([place]) 2006   Resolved -History"with 2006 pregnancy, toxcemia "  not seeing a cardiologist now.  . Chronic back pain   . Chronic bronchitis ([place])    "@ least once/yr" (01/17/2012) - none since 01/2012  . Complication of anesthesia    back surgeries x 6 - fusions T8-S2- rods  . Degenerative disk disease   . Difficult intubation    "needs small tube"  . Family history of anesthesia complication    Nausea  . GERD (gastroesophageal reflux disease)   . Migraines    last migraine 09/2013  . Neuromuscular disorder (HCC)    sciatica  - affecting left leg numb/pain  . Pneumonia    "~ once/yr" (01/17/2012) none since 01/2012  . PONV (postoperative nausea and vomiting)   . Respiratory failure ([place]) 11/2010; 09/2011   Resolved  - History pneumonia     Social History   Socioeconomic History  . Marital status: Married    Spouse name:  Not on file  . Number of children: 1  . Years of education: Not on file  . Highest education level: Not on file  Occupational History  . Occupation: Disabled  Social Needs  . Financial resource strain: Not on file  . Food insecurity:    Worry: Not on file    Inability: Not on file  . Transportation needs:    Medical: Not on file    Non-medical: Not on file  Tobacco Use  . Smoking status: Current Every Day Smoker    Years: 15.00    Types: Cigarettes    Last attempt to quit: 04/14/2013    Years since quitting: 4.1  . Smokeless tobacco: Never Used  . Tobacco comment: 3-4 cigarettes a day  Substance and Sexual Activity  . Alcohol use: No  . Drug use: No  . Sexual activity: Yes    Partners: Male    Birth control/protection: IUD  Lifestyle  . Physical activity:    Days per week: Not on file    Minutes per session: Not on file  . Stress: Not on file  Relationships  . Social connections:    Talks on phone: Not on file    Gets together: Not on file    Attends religious service: Not on file  Active member of club or organization: Not on file    Attends meetings of clubs or organizations: Not on file    Relationship status: Not on file  . Intimate partner violence:    Fear of current or ex partner: Not on file    Emotionally abused: Not on file    Physically abused: Not on file    Forced sexual activity: Not on file  Other Topics Concern  . Not on file  Social History Narrative   Married, 2 children   disabled    Past Surgical History:  Procedure Laterality Date  . ADENOIDECTOMY, TONSILLECTOMY AND MYRINGOTOMY WITH TUBE PLACEMENT  1983?  . ANTERIOR LUMBAR FUSION  01/17/2012   Procedure: ANTERIOR LUMBAR FUSION 2 LEVELS;  Surgeon: Lowella Grip, MD;  Location: [facility];  Service: Orthopedics;  Laterality: Bilateral;  Anterior Lumbar 3-4, Lumbar 4-5 fusion with bone graft harvest  . ANTERIOR LUMBAR FUSION    . BACK SURGERY  02/19/2014  . CERVICAL CONE BIOPSY  1999; 2000     cervical cancer  . CESAREAN SECTION N/A 11/08/2013   Procedure: REPEAT CESAREAN SECTION;  Surgeon: Shelly Bombard, MD;  Location: [facility];  Service: Obstetrics;  Laterality: N/A;  . KNEE ARTHROSCOPY  2004 X 2   "left X 2" (01/17/2012)  . LUMBAR DISC SURGERY  06/1998   "L4-5" (01/17/2012)  . LUMBAR FUSION  08/1998; 2010; 2012; 06/2011, 02/19/14   "fusion cage; put rods in; reanchored screws; rod broke" (01/17/2012); Had son in 10/2013 and rods in back broke.   Marland Kitchen SPINAL FUSION  10/04/2011   Procedure: FUSION POSTERIOR SPINAL MULTILEVEL;  Surgeon: Lowella Grip, MD;  Location: [facility];  Service: Orthopedics;  Laterality: Bilateral;  repair lumbar pseudoarthritis, revision segmental fixation, L3-L4, L4-L5  . SPINAL FUSION  07/06/2015   T7 - S2  . TONSILLECTOMY      Family History  Problem Relation Age of Onset  . COPD Mother   . Breast cancer Mother   . Heart disease Father 58  . Hypertension Father   . Diabetes Father        ESRD on dialysis  . Deep vein thrombosis Father   . Lung cancer Father   . Cancer Unknown   . Heart attack Sister 74       Died with MI    Allergies  Allergen Reactions  . Iodine Itching and Rash    Pt states she has used iodine and has had no reaction, she is allergic to steri strips. 08-30-16  . Ultram [Tramadol] Itching  . Other Rash    Compound Tincture of Benzoin     Current Outpatient Medications on File Prior to Visit  Medication Sig Dispense Refill  . diazepam (VALIUM) 10 MG tablet Take 10 mg by mouth.    . fluticasone (FLONASE) 50 MCG/ACT nasal spray Place 2 sprays into both nostrils daily. 16 g 1  . loratadine (CLARITIN) 10 MG tablet TAKE 1 TABLET BY MOUTH DAILY. 30 tablet 11  . Oxycodone HCl 10 MG TABS Take 10 mg by mouth 4 (four) times daily.  0  . promethazine (PHENERGAN) 50 MG tablet Take 1 tablet (50 mg total) by mouth every 6 (six) hours as needed for nausea or vomiting. 15 tablet 0  . SYMBICORT 160-4.5 MCG/ACT inhaler INHALE 2 PUFFS INTO  THE LUNGS 2 TIMES DAILY. 10.2 g 1  . buPROPion (WELLBUTRIN SR) 150 MG 12 hr tablet TAKE 1 TABLET BY MOUTH ONCE DAILY FOR FIRST  3 DAYS THEN INCREASE TO TWICE A DAY. (Patient not taking: Reported on 06/23/2017) 60 tablet 5  . Cholecalciferol (VITAMIN D PO) Take 500 mg by mouth 2 (two) times daily.     No current facility-administered medications on file prior to visit.     BP 116/74 (BP Location: Right Arm, Patient Position: Sitting, Cuff Size: Large)   Pulse 86   Temp 98.4 F (36.9 C) (Oral)   Resp 16   Ht 5\' 3"  (1.6 m)   Wt 207 lb (93.9 kg)   SpO2 97%   BMI 36.67 kg/m       Objective:   Physical Exam  Constitutional: She appears well-developed and well-nourished.  Cardiovascular: Normal rate, regular rhythm and normal heart sounds.  No murmur heard. Pulmonary/Chest: Effort normal and breath sounds normal. No respiratory distress. She has no wheezes.  Skin: Skin is warm and dry.  Flaking dry skin noted right posterior scalp   Psychiatric: She has a normal mood and affect. Her behavior is normal. Judgment and thought content normal.          Assessment & Plan:  Depression- stable off meds. Monitor.  Tobacco abuse- restart chantix, follow up in 1 month.  Eczema- rx with temovate ointment.  GERD- uncontrolled- did better on nexium. D/C omeprazole, start nexium.  Asthma- fair control- continue symbicort. Felt that ventolin worked better than proair- will send ventolin instead.  Migraines- uncontrolled. Felt that symptoms were better with prn imitrex than prn maxalt. Will change to imitrex.

## 2017-06-23 NOTE — Patient Instructions (Addendum)
Please complete lab work prior to leaving. Please stop omeprazole, start nexium. Begin imitrex as needed for migraine. Apply temovate ointment to affected scalp area twice daily for the next few weeks then as needed. Continue the Tgel shampoo and shampoo only every other day. Begin chantix.

## 2017-06-23 NOTE — Telephone Encounter (Signed)
Returned call, pt states been having pelvic pain for the last 3 days and can no longer feel IUD strings, wants to make appt, advised scheduler will call.

## 2017-06-26 ENCOUNTER — Ambulatory Visit: Payer: Medicare HMO | Admitting: Obstetrics

## 2017-06-26 DIAGNOSIS — R2689 Other abnormalities of gait and mobility: Secondary | ICD-10-CM | POA: Diagnosis not present

## 2017-06-26 DIAGNOSIS — M545 Low back pain: Secondary | ICD-10-CM | POA: Diagnosis not present

## 2017-07-03 DIAGNOSIS — R2689 Other abnormalities of gait and mobility: Secondary | ICD-10-CM | POA: Diagnosis not present

## 2017-07-03 DIAGNOSIS — M545 Low back pain: Secondary | ICD-10-CM | POA: Diagnosis not present

## 2017-07-05 DIAGNOSIS — M79604 Pain in right leg: Secondary | ICD-10-CM | POA: Diagnosis not present

## 2017-07-05 DIAGNOSIS — M545 Low back pain: Secondary | ICD-10-CM | POA: Diagnosis not present

## 2017-07-05 DIAGNOSIS — G894 Chronic pain syndrome: Secondary | ICD-10-CM | POA: Diagnosis not present

## 2017-07-05 DIAGNOSIS — Z79891 Long term (current) use of opiate analgesic: Secondary | ICD-10-CM | POA: Diagnosis not present

## 2017-07-12 ENCOUNTER — Encounter: Payer: Self-pay | Admitting: Family Medicine

## 2017-07-12 ENCOUNTER — Ambulatory Visit (INDEPENDENT_AMBULATORY_CARE_PROVIDER_SITE_OTHER): Payer: Medicare HMO | Admitting: Family Medicine

## 2017-07-12 VITALS — BP 107/73 | HR 81 | Temp 98.7°F | Ht 63.0 in | Wt 203.5 lb

## 2017-07-12 DIAGNOSIS — B070 Plantar wart: Secondary | ICD-10-CM

## 2017-07-12 DIAGNOSIS — H1131 Conjunctival hemorrhage, right eye: Secondary | ICD-10-CM | POA: Diagnosis not present

## 2017-07-12 NOTE — Patient Instructions (Addendum)
Subconjunctival Hemorrhage Subconjunctival hemorrhage is bleeding that happens between the white part of your eye (sclera) and the clear membrane that covers the outside of your eye (conjunctiva). There are many tiny blood vessels near the surface of your eye. A subconjunctival hemorrhage happens when one or more of these vessels breaks and bleeds, causing a red patch to appear on your eye. This is similar to a bruise. Depending on the amount of bleeding, the red patch may only cover a small area of your eye or it may cover the entire visible part of the sclera. If a lot of blood collects under the conjunctiva, there may also be swelling. Subconjunctival hemorrhages do not affect your vision or cause pain, but your eye may feel irritated if there is swelling. Subconjunctival hemorrhages usually do not require treatment, and they disappear on their own within two weeks. What are the causes? This condition may be caused by:  Mild trauma, such as rubbing your eye too hard.  Severe trauma or blunt injuries.  Coughing, sneezing, or vomiting.  Straining, such as when lifting a heavy object.  High blood pressure.  Recent eye surgery.  A history of diabetes.  Certain medicines, especially blood thinners (anticoagulants).  Other conditions, such as eye tumors, bleeding disorders, or blood vessel abnormalities.  Subconjunctival hemorrhages can happen without an obvious cause. What are the signs or symptoms? Symptoms of this condition include:  A bright red or dark red patch on the white part of the eye. ? The red area may spread out to cover a larger area of the eye before it goes away. ? The red area may turn brownish-yellow before it goes away.  Swelling.  Mild eye irritation.  How is this diagnosed? This condition is diagnosed with a physical exam. If your subconjunctival hemorrhage was caused by trauma, your health care provider may refer you to an eye specialist (ophthalmologist) or  another specialist to check for other injuries. You may have other tests, including:  An eye exam.  A blood pressure check.  Blood tests to check for bleeding disorders.  If your subconjunctival hemorrhage was caused by trauma, X-rays or a CT scan may be done to check for other injuries. How is this treated? Usually, no treatment is needed. Your health care provider may recommend eye drops or cold compresses to help with discomfort. Follow these instructions at home:  Take over-the-counter and prescription medicines only as directed by your health care provider.  Use eye drops or cold compresses to help with discomfort as directed by your health care provider.  Avoid activities, things, and environments that may irritate or injure your eye.  Keep all follow-up visits as told by your health care provider. This is important. Contact a health care provider if:  You have pain in your eye.  The bleeding does not go away within 3 weeks.  You keep getting new subconjunctival hemorrhages. Get help right away if:  Your vision changes or you have difficulty seeing.  You suddenly develop severe sensitivity to light.  You develop a severe headache, persistent vomiting, confusion, or abnormal tiredness (lethargy).  Your eye seems to bulge or protrude from your eye socket.  You develop unexplained bruises on your body.  You have unexplained bleeding in another area of your body. This information is not intended to replace advice given to you by your health care provider. Make sure you discuss any questions you have with your health care provider. Document Released: 01/31/2005 Document Revised: 09/27/2015 Document  Reviewed: 04/09/2014 Elsevier Interactive Patient Education  2018 [place] the same regimen where you pare down the dead skin buildup and use the duct tape/Compound W over the wart to ensure resolution. Use donut pads if painful to offload area.   Let us know  if you need anything.

## 2017-07-12 NOTE — Progress Notes (Signed)
Chief Complaint  Patient presents with  . Eye Problem    [name] is a 37 y.o. female here for a skin complaint.  Duration: several weeks Location: Bottom of right foot Pruritic? No Painful? No Drainage? No Therapies tried thus far: none  Over the past couple days, the patient is noticed bleeding on her right eyeball.  Some discomfort, no known cause.  It does not itch and is not having any drainage.  No headaches or vision changes.  ROS:  Const: No fevers Skin: As noted in HPI  Past Medical History:  Diagnosis Date  . Anxiety    no meds  . Asthma    "very badly" (01/17/2012)  . Cervical cancer ([place]) 1999; 2000  . CHF (congestive heart failure) ([place]) 2006   Resolved -History"with 2006 pregnancy, toxcemia "  not seeing a cardiologist now.  . Chronic back pain   . Chronic bronchitis ([place])    "@ least once/yr" (01/17/2012) - none since 01/2012  . Complication of anesthesia    back surgeries x 6 - fusions T8-S2- rods  . Degenerative disk disease   . Difficult intubation    "needs small tube"  . Family history of anesthesia complication    Nausea  . GERD (gastroesophageal reflux disease)   . Migraines    last migraine 09/2013  . Neuromuscular disorder (HCC)    sciatica  - affecting left leg numb/pain  . Pneumonia    "~ once/yr" (01/17/2012) none since 01/2012  . PONV (postoperative nausea and vomiting)   . Respiratory failure ([place]) 11/2010; 09/2011   Resolved  - History pneumonia   Allergies  Allergen Reactions  . Iodine Itching and Rash    Pt states she has used iodine and has had no reaction, she is allergic to steri strips. 08-30-16  . Ultram [Tramadol] Itching  . Other Rash    Compound Tincture of Benzoin    Allergies as of 07/12/2017      Reactions   Iodine Itching, Rash   Pt states she has used iodine and has had no reaction, she is allergic to steri strips. 08-30-16   Ultram [tramadol] Itching   Other Rash   Compound Tincture of Benzoin         Medication List        Accurate as of 07/12/17  4:37 PM. Always use your most recent med list.          buPROPion 150 MG 12 hr tablet Commonly known as:  WELLBUTRIN SR TAKE 1 TABLET BY MOUTH ONCE DAILY FOR FIRST 3 DAYS THEN INCREASE TO TWICE A DAY.   clobetasol ointment 0.05 % Commonly known as:  TEMOVATE Apply 1 application topically 2 (two) times daily.   diazepam 10 MG tablet Commonly known as:  VALIUM Take 10 mg by mouth.   esomeprazole 40 MG capsule Commonly known as:  NEXIUM Take 1 capsule (40 mg total) by mouth daily at 12 noon.   fluticasone 50 MCG/ACT nasal spray Commonly known as:  FLONASE Place 2 sprays into both nostrils daily.   loratadine 10 MG tablet Commonly known as:  CLARITIN TAKE 1 TABLET BY MOUTH DAILY.   Oxycodone HCl 10 MG Tabs Take 10 mg by mouth 4 (four) times daily.   promethazine 50 MG tablet Commonly known as:  PHENERGAN Take 1 tablet (50 mg total) by mouth every 6 (six) hours as needed for nausea or vomiting.   SUMAtriptan 50 MG tablet Commonly known as:  IMITREX Take  1 tablet (50 mg total) by mouth every 2 (two) hours as needed for migraine. May repeat in 2 hours if headache persists or recurs.   SYMBICORT 160-4.5 MCG/ACT inhaler Generic drug:  budesonide-formoterol INHALE 2 PUFFS INTO THE LUNGS 2 TIMES DAILY.   varenicline 0.5 MG X 11 & 1 MG X 42 tablet Commonly known as:  CHANTIX STARTING MONTH PAK 0.5 mg tab PO daily for 3 days, then one 0.5 mg tab PO BID daily for 4 days, then increase to one 1 mg tablet twice daily.   VENTOLIN HFA 108 (90 Base) MCG/ACT inhaler Generic drug:  albuterol Inhale 2 puffs into the lungs every 6 (six) hours as needed for wheezing or shortness of breath.   VITAMIN D PO Take 500 mg by mouth 2 (two) times daily.       BP 107/73 (BP Location: Left Arm, Patient Position: Sitting, Cuff Size: Large)   Pulse 81   Temp 98.7 F (37.1 C) (Oral)   Ht 5\' 3"  (1.6 m)   Wt 203 lb 8 oz (92.3 kg)   SpO2 99%    BMI 36.05 kg/m  Gen: awake, alert, appearing stated age Lungs: No accessory muscle use Skin: Plantar wart noted at metatarsal 3rd head Psych: Age appropriate judgment and insight  Procedure note: Cryotherapy Verbal consent obtained Liquid nitrogen applied 5x over affected area Pt tolerated procedure well No immediate complications noted  Subconjunctival hemorrhage, right  Plantar wart - Plan: PR DESTRUC BENIGN/PREMAL,FIRST LESION  Orders as above. Counseled on #1, will resolve on own. Cryo today. Home tx discussed and written down. F/u prn. The patient voiced understanding and agreement to the plan.  [place], DO 07/12/17 4:37 PM

## 2017-07-12 NOTE — Progress Notes (Signed)
Pre visit review using our clinic review tool, if applicable. No additional management support is needed unless otherwise documented below in the visit note. 

## 2017-07-26 ENCOUNTER — Ambulatory Visit (INDEPENDENT_AMBULATORY_CARE_PROVIDER_SITE_OTHER): Payer: Medicare HMO | Admitting: Family

## 2017-07-26 ENCOUNTER — Encounter: Payer: Self-pay | Admitting: Family

## 2017-07-26 VITALS — BP 113/61 | HR 72 | Temp 98.4°F | Resp 18 | Ht 63.0 in | Wt 202.8 lb

## 2017-07-26 DIAGNOSIS — Z720 Tobacco use: Secondary | ICD-10-CM | POA: Diagnosis not present

## 2017-07-26 DIAGNOSIS — Z0000 Encounter for general adult medical examination without abnormal findings: Secondary | ICD-10-CM

## 2017-07-26 DIAGNOSIS — J4540 Moderate persistent asthma, uncomplicated: Secondary | ICD-10-CM | POA: Diagnosis not present

## 2017-07-26 DIAGNOSIS — K219 Gastro-esophageal reflux disease without esophagitis: Secondary | ICD-10-CM

## 2017-07-26 DIAGNOSIS — F329 Major depressive disorder, single episode, unspecified: Secondary | ICD-10-CM | POA: Diagnosis not present

## 2017-07-26 DIAGNOSIS — F32A Depression, unspecified: Secondary | ICD-10-CM

## 2017-07-26 MED ORDER — VARENICLINE TARTRATE 1 MG PO TABS: 1.0000 mg | ORAL_TABLET | Freq: Two times a day (BID) | ORAL | 1 refills | Status: DC

## 2017-07-26 NOTE — Patient Instructions (Signed)
Please increase symbicort to bid. Great job quitting smoking, keep up the good work!

## 2017-07-26 NOTE — Progress Notes (Signed)
Subjective:    Patient ID: [name], female    DOB: [birth year], 37 y.o.   MRN: [id number]  HPI   Pt is a 37 yr old female who presents today for follow up.   Asthma- maintained on symbicort and prn albuterol. Reports that symbicort makes her Jittery, so "I don't use it like I should."    GERD- maintained on nexium 40mg  daily. Reports reflux stable.   Migraine- continues prn imitrex.  Reports 1-2 migraines/week.  imitrex helps if she takes early enough.    Tobacco abuse- last visit we gave her a trial of chantix. She reports that she can smell much better.  She smoked her last cigarette 20 days ago.  Review of Systems See HPI  Past Medical History:  Diagnosis Date  . Anxiety    no meds  . Asthma    "very badly" (01/17/2012)  . Cervical cancer ([place]) 1999; 2000  . CHF (congestive heart failure) ([place]) 2006   Resolved -History"with 2006 pregnancy, toxcemia "  not seeing a cardiologist now.  . Chronic back pain   . Chronic bronchitis ([place])    "@ least once/yr" (01/17/2012) - none since 01/2012  . Complication of anesthesia    back surgeries x 6 - fusions T8-S2- rods  . Degenerative disk disease   . Difficult intubation    "needs small tube"  . Family history of anesthesia complication    Nausea  . GERD (gastroesophageal reflux disease)   . Migraines    last migraine 09/2013  . Neuromuscular disorder (HCC)    sciatica  - affecting left leg numb/pain  . Pneumonia    "~ once/yr" (01/17/2012) none since 01/2012  . PONV (postoperative nausea and vomiting)   . Respiratory failure ([place]) 11/2010; 09/2011   Resolved  - History pneumonia     Social History   Socioeconomic History  . Marital status: Married    Spouse name: Not on file  . Number of children: 1  . Years of education: Not on file  . Highest education level: Not on file  Occupational History  . Occupation: Disabled  Social Needs  . Financial resource strain: Not on file  . Food insecurity:    Worry:  Not on file    Inability: Not on file  . Transportation needs:    Medical: Not on file    Non-medical: Not on file  Tobacco Use  . Smoking status: Former Smoker    Years: 15.00    Types: Cigarettes    Last attempt to quit: 07/06/2017    Years since quitting: 0.0  . Smokeless tobacco: Never Used  . Tobacco comment: 3-4 cigarettes a day  Substance and Sexual Activity  . Alcohol use: No  . Drug use: No  . Sexual activity: Yes    Partners: Male    Birth control/protection: IUD  Lifestyle  . Physical activity:    Days per week: Not on file    Minutes per session: Not on file  . Stress: Not on file  Relationships  . Social connections:    Talks on phone: Not on file    Gets together: Not on file    Attends religious service: Not on file    Active member of club or organization: Not on file    Attends meetings of clubs or organizations: Not on file    Relationship status: Not on file  . Intimate partner violence:    Fear of current or ex partner: Not  on file    Emotionally abused: Not on file    Physically abused: Not on file    Forced sexual activity: Not on file  Other Topics Concern  . Not on file  Social History Narrative   Married, 2 children   disabled    Past Surgical History:  Procedure Laterality Date  . ADENOIDECTOMY, TONSILLECTOMY AND MYRINGOTOMY WITH TUBE PLACEMENT  1983?  . ANTERIOR LUMBAR FUSION  01/17/2012   Procedure: ANTERIOR LUMBAR FUSION 2 LEVELS;  Surgeon: Lowella Grip, MD;  Location: [facility];  Service: Orthopedics;  Laterality: Bilateral;  Anterior Lumbar 3-4, Lumbar 4-5 fusion with bone graft harvest  . ANTERIOR LUMBAR FUSION    . BACK SURGERY  02/19/2014  . CERVICAL CONE BIOPSY  1999; 2000   cervical cancer  . CESAREAN SECTION N/A 11/08/2013   Procedure: REPEAT CESAREAN SECTION;  Surgeon: Shelly Bombard, MD;  Location: [facility];  Service: Obstetrics;  Laterality: N/A;  . KNEE ARTHROSCOPY  2004 X 2   "left X 2" (01/17/2012)  . LUMBAR DISC  SURGERY  06/1998   "L4-5" (01/17/2012)  . LUMBAR FUSION  08/1998; 2010; 2012; 06/2011, 02/19/14   "fusion cage; put rods in; reanchored screws; rod broke" (01/17/2012); Had son in 10/2013 and rods in back broke.   Marland Kitchen SPINAL FUSION  10/04/2011   Procedure: FUSION POSTERIOR SPINAL MULTILEVEL;  Surgeon: Lowella Grip, MD;  Location: [facility];  Service: Orthopedics;  Laterality: Bilateral;  repair lumbar pseudoarthritis, revision segmental fixation, L3-L4, L4-L5  . SPINAL FUSION  07/06/2015   T7 - S2  . TONSILLECTOMY      Family History  Problem Relation Age of Onset  . COPD Mother   . Breast cancer Mother   . Heart disease Father 4  . Hypertension Father   . Diabetes Father        ESRD on dialysis  . Deep vein thrombosis Father   . Lung cancer Father   . Cancer Unknown   . Heart attack Sister 16       Died with MI    Allergies  Allergen Reactions  . Iodine Itching and Rash    Pt states she has used iodine and has had no reaction, she is allergic to steri strips. 08-30-16  . Ultram [Tramadol] Itching  . Other Rash    Compound Tincture of Benzoin     Current Outpatient Medications on File Prior to Visit  Medication Sig Dispense Refill  . Cholecalciferol (VITAMIN D PO) Take 500 mg by mouth 2 (two) times daily.    . clobetasol ointment (TEMOVATE) 1.02 % Apply 1 application topically 2 (two) times daily. 30 g 1  . esomeprazole (NEXIUM) 40 MG capsule Take 1 capsule (40 mg total) by mouth daily at 12 noon. 30 capsule 5  . fluticasone (FLONASE) 50 MCG/ACT nasal spray Place 2 sprays into both nostrils daily. 16 g 1  . loratadine (CLARITIN) 10 MG tablet TAKE 1 TABLET BY MOUTH DAILY. 30 tablet 11  . oxyCODONE (ROXICODONE) 15 MG immediate release tablet Take 15 mg by mouth every 6 (six) hours as needed. for pain  0  . promethazine (PHENERGAN) 50 MG tablet Take 1 tablet (50 mg total) by mouth every 6 (six) hours as needed for nausea or vomiting. 15 tablet 0  . SUMAtriptan (IMITREX) 50 MG tablet  Take 1 tablet (50 mg total) by mouth every 2 (two) hours as needed for migraine. May repeat in 2 hours if headache persists or recurs.  10 tablet 5  . SYMBICORT 160-4.5 MCG/ACT inhaler INHALE 2 PUFFS INTO THE LUNGS 2 TIMES DAILY. 10.2 g 1  . varenicline (CHANTIX STARTING MONTH PAK) 0.5 MG X 11 & 1 MG X 42 tablet 0.5 mg tab PO daily for 3 days, then one 0.5 mg tab PO BID daily for 4 days, then increase to one 1 mg tablet twice daily. 53 tablet 0  . VENTOLIN HFA 108 (90 Base) MCG/ACT inhaler Inhale 2 puffs into the lungs every 6 (six) hours as needed for wheezing or shortness of breath. 1 Inhaler 5   No current facility-administered medications on file prior to visit.     BP 113/61 (BP Location: Right Arm, Cuff Size: Large)   Pulse 72   Temp 98.4 F (36.9 C) (Oral)   Resp 18   Ht 5\' 3"  (1.6 m)   Wt 202 lb 12.8 oz (92 kg)   SpO2 100%   BMI 35.92 kg/m       Objective:   Physical Exam  Constitutional: She is oriented to person, place, and time. She appears well-developed and well-nourished.  Cardiovascular: Normal rate, regular rhythm and normal heart sounds.  No murmur heard. Pulmonary/Chest: Effort normal and breath sounds normal. No respiratory distress. She has no wheezes.  Neurological: She is alert and oriented to person, place, and time.  Psychiatric: She has a normal mood and affect. Her behavior is normal. Judgment and thought content normal.          Assessment & Plan:  Depression- fair control. Scored 9 on PHQ-9. Coming up on the anniversary of her father's death. Also stress from building a new house.  Discussed referral for counseling but she declines at this time due to cost.   Asthma- not optimally controlled.  Only using symbicort once daily. She is agreeable to try to increase to bid.   GERD- stable on nexium, continue same.   Tobacco abuse- commended pt on quitting smoking. Continue chantix x 2 more months.

## 2017-08-02 DIAGNOSIS — G894 Chronic pain syndrome: Secondary | ICD-10-CM | POA: Diagnosis not present

## 2017-08-02 DIAGNOSIS — M79604 Pain in right leg: Secondary | ICD-10-CM | POA: Diagnosis not present

## 2017-08-02 DIAGNOSIS — M545 Low back pain: Secondary | ICD-10-CM | POA: Diagnosis not present

## 2017-08-02 DIAGNOSIS — Z79891 Long term (current) use of opiate analgesic: Secondary | ICD-10-CM | POA: Diagnosis not present

## 2017-09-06 DIAGNOSIS — G894 Chronic pain syndrome: Secondary | ICD-10-CM | POA: Diagnosis not present

## 2017-09-06 DIAGNOSIS — Z79891 Long term (current) use of opiate analgesic: Secondary | ICD-10-CM | POA: Diagnosis not present

## 2017-09-06 DIAGNOSIS — M79604 Pain in right leg: Secondary | ICD-10-CM | POA: Diagnosis not present

## 2017-09-06 DIAGNOSIS — M545 Low back pain: Secondary | ICD-10-CM | POA: Diagnosis not present

## 2017-10-04 DIAGNOSIS — M79604 Pain in right leg: Secondary | ICD-10-CM | POA: Diagnosis not present

## 2017-10-04 DIAGNOSIS — M25512 Pain in left shoulder: Secondary | ICD-10-CM | POA: Diagnosis not present

## 2017-10-04 DIAGNOSIS — M545 Low back pain: Secondary | ICD-10-CM | POA: Diagnosis not present

## 2017-10-04 DIAGNOSIS — G894 Chronic pain syndrome: Secondary | ICD-10-CM | POA: Diagnosis not present

## 2017-10-04 DIAGNOSIS — Z79891 Long term (current) use of opiate analgesic: Secondary | ICD-10-CM | POA: Diagnosis not present

## 2017-10-27 ENCOUNTER — Encounter: Payer: Medicare HMO | Admitting: Family

## 2017-11-01 DIAGNOSIS — Z79891 Long term (current) use of opiate analgesic: Secondary | ICD-10-CM | POA: Diagnosis not present

## 2017-11-01 DIAGNOSIS — G894 Chronic pain syndrome: Secondary | ICD-10-CM | POA: Diagnosis not present

## 2017-11-01 DIAGNOSIS — M25512 Pain in left shoulder: Secondary | ICD-10-CM | POA: Diagnosis not present

## 2017-11-01 DIAGNOSIS — M79604 Pain in right leg: Secondary | ICD-10-CM | POA: Diagnosis not present

## 2017-11-01 DIAGNOSIS — M545 Low back pain: Secondary | ICD-10-CM | POA: Diagnosis not present

## 2017-11-14 ENCOUNTER — Telehealth: Payer: Self-pay | Admitting: Family

## 2017-11-21 ENCOUNTER — Telehealth: Payer: Self-pay | Admitting: Family

## 2017-11-21 ENCOUNTER — Encounter: Payer: Medicare HMO | Admitting: Family

## 2017-11-21 DIAGNOSIS — Z0289 Encounter for other administrative examinations: Secondary | ICD-10-CM

## 2017-11-21 NOTE — Telephone Encounter (Signed)
pts last no show at our office was 02/24/17. However, there were a few last year.

## 2017-11-21 NOTE — Telephone Encounter (Signed)
-----   Message from Debbrah Alar, NP sent at 11/21/2017 12:14 PM EDT ----- Regarding: RE: No show Yes please. How many no shows has she had with our office?  ----- Message ----- From: Herbie Saxon Sent: 11/21/2017  12:04 PM EDT To: Debbrah Alar, NP Subject: No show                                        11/21/17 -- 9:40 no show  Charge or no charge

## 2017-12-01 DIAGNOSIS — Z79891 Long term (current) use of opiate analgesic: Secondary | ICD-10-CM | POA: Diagnosis not present

## 2017-12-01 DIAGNOSIS — M25512 Pain in left shoulder: Secondary | ICD-10-CM | POA: Diagnosis not present

## 2017-12-01 DIAGNOSIS — M545 Low back pain: Secondary | ICD-10-CM | POA: Diagnosis not present

## 2017-12-01 DIAGNOSIS — M79604 Pain in right leg: Secondary | ICD-10-CM | POA: Diagnosis not present

## 2017-12-01 DIAGNOSIS — G894 Chronic pain syndrome: Secondary | ICD-10-CM | POA: Diagnosis not present

## 2017-12-05 MED ORDER — CLOBETASOL PROPIONATE 0.05 % EX OINT: 1.0000 "application " | TOPICAL_OINTMENT | Freq: Two times a day (BID) | CUTANEOUS | 1 refills | Status: DC

## 2017-12-05 NOTE — Telephone Encounter (Signed)
Refill sent. Notified pt. 

## 2017-12-05 NOTE — Telephone Encounter (Signed)
Pt called and rescheduled her CPE.  Pt wants to know if clobetasol ointment (TEMOVATE) 0.05 % can be called in for her.  Pt states that her scalp is bleeding and she really needs this medication.

## 2017-12-15 ENCOUNTER — Encounter: Payer: Medicare HMO | Admitting: Family

## 2017-12-19 ENCOUNTER — Telehealth: Payer: Self-pay | Admitting: Family

## 2017-12-19 NOTE — Telephone Encounter (Signed)
This was created in error

## 2017-12-20 ENCOUNTER — Encounter: Payer: Self-pay | Admitting: Family

## 2017-12-20 ENCOUNTER — Telehealth: Payer: Self-pay

## 2017-12-20 NOTE — Telephone Encounter (Signed)
Copied from [place] [id number]. Topic: Quick Communication - See Telephone Encounter >> Dec 19, 2017  5:31 PM Cecelia Byars, NT wrote: CRM for notification. See Telephone encounter for: 12/19/17. Reason for CRM: Patient says she received a 50.00 no show bill and she says she spoke to someone in the office to cancel the appointment when they called to remind her of the appointment  to please call her at  [phone].

## 2017-12-28 ENCOUNTER — Other Ambulatory Visit: Payer: Self-pay | Admitting: Obstetrics

## 2017-12-28 DIAGNOSIS — M25512 Pain in left shoulder: Secondary | ICD-10-CM | POA: Diagnosis not present

## 2017-12-28 DIAGNOSIS — M79604 Pain in right leg: Secondary | ICD-10-CM | POA: Diagnosis not present

## 2017-12-28 DIAGNOSIS — G894 Chronic pain syndrome: Secondary | ICD-10-CM | POA: Diagnosis not present

## 2017-12-28 DIAGNOSIS — Z79891 Long term (current) use of opiate analgesic: Secondary | ICD-10-CM | POA: Diagnosis not present

## 2017-12-28 DIAGNOSIS — M545 Low back pain: Secondary | ICD-10-CM | POA: Diagnosis not present

## 2018-01-25 DIAGNOSIS — Z79891 Long term (current) use of opiate analgesic: Secondary | ICD-10-CM | POA: Diagnosis not present

## 2018-01-25 DIAGNOSIS — M79604 Pain in right leg: Secondary | ICD-10-CM | POA: Diagnosis not present

## 2018-01-25 DIAGNOSIS — M25512 Pain in left shoulder: Secondary | ICD-10-CM | POA: Diagnosis not present

## 2018-01-25 DIAGNOSIS — G894 Chronic pain syndrome: Secondary | ICD-10-CM | POA: Diagnosis not present

## 2018-01-25 DIAGNOSIS — M545 Low back pain: Secondary | ICD-10-CM | POA: Diagnosis not present

## 2018-03-08 DIAGNOSIS — Z79891 Long term (current) use of opiate analgesic: Secondary | ICD-10-CM | POA: Diagnosis not present

## 2018-03-08 DIAGNOSIS — M545 Low back pain: Secondary | ICD-10-CM | POA: Diagnosis not present

## 2018-03-08 DIAGNOSIS — G894 Chronic pain syndrome: Secondary | ICD-10-CM | POA: Diagnosis not present

## 2018-03-08 DIAGNOSIS — M25512 Pain in left shoulder: Secondary | ICD-10-CM | POA: Diagnosis not present

## 2018-03-08 DIAGNOSIS — M79604 Pain in right leg: Secondary | ICD-10-CM | POA: Diagnosis not present

## 2018-03-09 ENCOUNTER — Encounter: Payer: Self-pay | Admitting: Family Medicine

## 2018-03-12 ENCOUNTER — Ambulatory Visit: Payer: 59 | Admitting: Family Medicine

## 2018-04-05 DIAGNOSIS — M545 Low back pain: Secondary | ICD-10-CM | POA: Diagnosis not present

## 2018-04-05 DIAGNOSIS — G894 Chronic pain syndrome: Secondary | ICD-10-CM | POA: Diagnosis not present

## 2018-04-05 DIAGNOSIS — M79604 Pain in right leg: Secondary | ICD-10-CM | POA: Diagnosis not present

## 2018-04-05 DIAGNOSIS — Z79891 Long term (current) use of opiate analgesic: Secondary | ICD-10-CM | POA: Diagnosis not present

## 2018-04-05 DIAGNOSIS — M25512 Pain in left shoulder: Secondary | ICD-10-CM | POA: Diagnosis not present

## 2018-05-03 DIAGNOSIS — Z79891 Long term (current) use of opiate analgesic: Secondary | ICD-10-CM | POA: Diagnosis not present

## 2018-05-03 DIAGNOSIS — M545 Low back pain: Secondary | ICD-10-CM | POA: Diagnosis not present

## 2018-05-03 DIAGNOSIS — M79604 Pain in right leg: Secondary | ICD-10-CM | POA: Diagnosis not present

## 2018-05-03 DIAGNOSIS — M25512 Pain in left shoulder: Secondary | ICD-10-CM | POA: Diagnosis not present

## 2018-05-03 DIAGNOSIS — G894 Chronic pain syndrome: Secondary | ICD-10-CM | POA: Diagnosis not present

## 2018-05-15 DIAGNOSIS — F4320 Adjustment disorder, unspecified: Secondary | ICD-10-CM | POA: Diagnosis not present

## 2018-05-18 ENCOUNTER — Other Ambulatory Visit: Payer: Self-pay

## 2018-05-18 ENCOUNTER — Ambulatory Visit (INDEPENDENT_AMBULATORY_CARE_PROVIDER_SITE_OTHER): Payer: Medicare HMO | Admitting: Family

## 2018-05-18 ENCOUNTER — Ambulatory Visit: Payer: Self-pay | Admitting: *Deleted

## 2018-05-18 DIAGNOSIS — J4540 Moderate persistent asthma, uncomplicated: Secondary | ICD-10-CM | POA: Diagnosis not present

## 2018-05-18 DIAGNOSIS — G43809 Other migraine, not intractable, without status migrainosus: Secondary | ICD-10-CM | POA: Diagnosis not present

## 2018-05-18 DIAGNOSIS — R1013 Epigastric pain: Secondary | ICD-10-CM

## 2018-05-18 DIAGNOSIS — K219 Gastro-esophageal reflux disease without esophagitis: Secondary | ICD-10-CM

## 2018-05-18 MED ORDER — ESOMEPRAZOLE MAGNESIUM 40 MG PO CPDR: 40.0000 mg | DELAYED_RELEASE_CAPSULE | Freq: Every day | ORAL | 5 refills | Status: DC

## 2018-05-18 NOTE — Telephone Encounter (Signed)
Seen today via Webex meeting

## 2018-05-18 NOTE — Telephone Encounter (Signed)
Message from [name] sent at 05/18/2018 10:10 AM EDT   Summary: advice   Pt states she has been throwing up 3 days, but not today. Only been able to drink powerade. Pt not sure if she ate something that made her sick. Pt states she was in bed 2 days. First day she able to get up. No SOB, no fever. Just sick on stomach with the vomiting. Pt thinks she is much better, just wants to run sx by a nurse.         Pt stated this started on Wednesday with nausea and vomiting. After eating chicken noodle soup and peanut butter and jelly sandwich. She was in the bed until today. No vomiting today. Has been trying to drink powerdade.  She denies fever or any respiratory symptoms Just  now having stools that look like sludge and they are black brown in color. No red or blood noted in stools. Had 4 stools today.  Having abd pain that is constant, feels a little bloated and looks distended.  Advised to try eating bland food, like toast, cont to try and drink fluids to prevent dehydration. Also an appointment would be a virtual visit. Pt voiced understanding. Per protocol, should be seen within 24 hours. Her E- mail address   [email or web address] # [phone] Routing to flow at [facility] at [place] for  appointment.  Reason for Disposition . [1] MODERATE pain (e.g., interferes with normal activities) AND [2] pain comes and goes (cramps) AND [3] present > 24 hours  (Exception: pain with Vomiting or Diarrhea - see that Guideline)  Answer Assessment - Initial Assessment Questions 1. LOCATION: "Where does it hurt?"      Upper part of abd 2. RADIATION: "Does the pain shoot anywhere else?" (e.g., chest, back)     nom 3. ONSET: "When did the pain begin?" (e.g., minutes, hours or days ago)      yesterday 4. SUDDEN: "Gradual or sudden onset?"     sudden 5. PATTERN "Does the pain come and go, or is it constant?"    - If constant: "Is it getting better, staying the same, or worsening?"   (Note: Constant means the pain never goes away completely; most serious pain is constant and it progresses)     - If intermittent: "How long does it last?" "Do you have pain now?"     (Note: Intermittent means the pain goes away completely between bouts)     constant 6. SEVERITY: "How bad is the pain?"  (e.g., Scale 1-10; mild, moderate, or severe)   - MILD (1-3): doesn't interfere with normal activities, abdomen soft and not tender to touch    - MODERATE (4-7): interferes with normal activities or awakens from sleep, tender to touch    - SEVERE (8-10): excruciating pain, doubled over, unable to do any normal activities      Pain # 5 or 6 7. RECURRENT SYMPTOM: "Have you ever had this type of abdominal pain before?" If so, ask: "When was the last time?" and "What happened that time?"      Yes when she had alcohol poisoning  Years ago 8. CAUSE: "What do you think is causing the abdominal pain?"     Not sure, did have vomiting on Wednesday 9. RELIEVING/AGGRAVATING FACTORS: "What makes it better or worse?" (e.g., movement, antacids, bowel movement)     Putting pressure on abd makes it better 10. OTHER SYMPTOMS: "Has there been any vomiting, diarrhea, constipation, or urine  problems?"       Diarrhea (slug type of stool), states urine smell like chicken noodle soup, had vomiting on Wednesday 11. PREGNANCY: "Is there any chance you are pregnant?" "When was your last menstrual period?"       Not pregnant LMP /  has an IUD  Protocols used: ABDOMINAL PAIN - [facility]

## 2018-05-18 NOTE — Progress Notes (Signed)
Virtual Visit via Video Note  I connected with [name] on 05/18/18 at  3:40 PM EDT by a video enabled telemedicine application and verified that I am speaking with the correct person using two identifiers.   I discussed the limitations of evaluation and management by telemedicine and the availability of in person appointments. The patient expressed understanding and agreed to proceed.  History of Present Illness:  Pt is a 38 yr old female who presents today for follow up and to discuss some acute issues.  1) Asthma-reports that symptoms have been controlled with prn albuterol only. She is no longer needing symbicort.   2) GERD- reports that she is no longer taking nexium.  3) migraine- has imitrex on hand. Reports occasional migraines.  4) Nausea/vomitting- reports that she began to have nausea/vomitting and weakness on Wednesday. Denies hematemesis.  Reports that she was in bed for 45 hrs except to shower and use the bathroom.  She reports that she still feels weak still but better at this time.  Denies associated fever. Has had some epigastric pain.  Did not have BM yesterday. Today she had 4 small very soft  Almost black stools today. Reports that she has tolerated a small amount of liquid today as well as some graham crackers.  Reports that she has been taking nexium 800mg  once daily for pain.    Observations/Objective:   Gen: Awake, alert, no acute distress Eyes: conjunctiva do not appear pale Resp: Breathing is even and non-labored Psych: calm/pleasant demeanor Neuro: Alert and Oriented x 3, + facial symmetry, speech is clear.   Assessment and Plan:  Epigastric pain- suspect a resolving viral gastroenteritis.  I have advised the patient to push fluids today. If unable to keep down fluids or if worsening abdominal pain/weakness  or recurrent dark stools I have asked her to go to the ED over the weekend. Gastritis/bleeding ulcer is a possibility as well. I have advised  pt to stop ibuprofen, instead my use tylenol prn but not to exceed 3000 mg/day.  If she improves over the weekend, then I will have her come to the lab on Monday and complete CBC, CMET, Lipase and IFOB.    GERD- restart PPI.   Asthma- controlled.   Migraine- stable, continue imitrex.  Follow Up Instructions:    I discussed the assessment and treatment plan with the patient. The patient was provided an opportunity to ask questions and all were answered. The patient agreed with the plan and demonstrated an understanding of the instructions.   The patient was advised to call back or seek an in-person evaluation if the symptoms worsen or if the condition fails to improve as anticipated.   Nance Pear, NP

## 2018-05-21 ENCOUNTER — Other Ambulatory Visit (INDEPENDENT_AMBULATORY_CARE_PROVIDER_SITE_OTHER): Payer: Medicare HMO

## 2018-05-21 ENCOUNTER — Other Ambulatory Visit: Payer: Self-pay

## 2018-05-21 DIAGNOSIS — R1013 Epigastric pain: Secondary | ICD-10-CM

## 2018-05-22 ENCOUNTER — Encounter: Payer: Self-pay | Admitting: Family

## 2018-05-22 LAB — COMPREHENSIVE METABOLIC PANEL
ALT: 19 U/L (ref 0–35)
AST: 18 U/L (ref 0–37)
Albumin: 4 g/dL (ref 3.5–5.2)
Alkaline Phosphatase: 49 U/L (ref 39–117)
BUN: 13 mg/dL (ref 6–23)
CO2: 30 mEq/L (ref 19–32)
Calcium: 9.1 mg/dL (ref 8.4–10.5)
Chloride: 103 mEq/L (ref 96–112)
Creatinine, Ser: 0.86 mg/dL (ref 0.40–1.20)
GFR: 73 mL/min (ref >60.00)
Glucose, Bld: 95 mg/dL (ref 70–99)
Potassium: 3.8 mEq/L (ref 3.5–5.1)
Sodium: 139 mEq/L (ref 135–145)
Total Bilirubin: 0.4 mg/dL (ref 0.2–1.2)
Total Protein: 6.4 g/dL (ref 6.0–8.3)

## 2018-05-22 LAB — CBC WITH DIFFERENTIAL/PLATELET
Basophils Absolute: 0.1 10*3/uL (ref 0.0–0.1)
Basophils Relative: 0.8 % (ref 0.0–3.0)
Eosinophils Absolute: 0.6 10*3/uL (ref 0.0–0.7)
Eosinophils Relative: 9.5 % — ABNORMAL HIGH (ref 0.0–5.0)
HCT: 42.5 % (ref 36.0–46.0)
Hemoglobin: 14.4 g/dL (ref 12.0–15.0)
Lymphocytes Relative: 32.7 % (ref 12.0–46.0)
Lymphs Abs: 2.2 10*3/uL (ref 0.7–4.0)
MCHC: 33.8 g/dL (ref 30.0–36.0)
MCV: 95.5 fl (ref 78.0–100.0)
Monocytes Absolute: 0.4 10*3/uL (ref 0.1–1.0)
Monocytes Relative: 5.4 % (ref 3.0–12.0)
Neutro Abs: 3.5 10*3/uL (ref 1.4–7.7)
Neutrophils Relative %: 51.6 % (ref 43.0–77.0)
Platelets: 300 10*3/uL (ref 150.0–400.0)
RBC: 4.45 Mil/uL (ref 3.87–5.11)
RDW: 13.2 % (ref 11.5–15.5)
WBC: 6.7 10*3/uL (ref 4.0–10.5)

## 2018-05-22 LAB — LIPASE: Lipase: 16 U/L (ref 11.0–59.0)

## 2018-05-23 NOTE — Progress Notes (Signed)
Mailed out to pt 

## 2018-06-01 DIAGNOSIS — Z79891 Long term (current) use of opiate analgesic: Secondary | ICD-10-CM | POA: Diagnosis not present

## 2018-06-01 DIAGNOSIS — G894 Chronic pain syndrome: Secondary | ICD-10-CM | POA: Diagnosis not present

## 2018-06-01 DIAGNOSIS — M25512 Pain in left shoulder: Secondary | ICD-10-CM | POA: Diagnosis not present

## 2018-06-01 DIAGNOSIS — M545 Low back pain: Secondary | ICD-10-CM | POA: Diagnosis not present

## 2018-06-01 DIAGNOSIS — M79604 Pain in right leg: Secondary | ICD-10-CM | POA: Diagnosis not present

## 2018-06-29 DIAGNOSIS — M545 Low back pain: Secondary | ICD-10-CM | POA: Diagnosis not present

## 2018-06-29 DIAGNOSIS — G894 Chronic pain syndrome: Secondary | ICD-10-CM | POA: Diagnosis not present

## 2018-06-29 DIAGNOSIS — Z79891 Long term (current) use of opiate analgesic: Secondary | ICD-10-CM | POA: Diagnosis not present

## 2018-06-29 DIAGNOSIS — M79604 Pain in right leg: Secondary | ICD-10-CM | POA: Diagnosis not present

## 2018-06-29 DIAGNOSIS — M25512 Pain in left shoulder: Secondary | ICD-10-CM | POA: Diagnosis not present

## 2018-07-19 ENCOUNTER — Other Ambulatory Visit: Payer: Self-pay

## 2018-07-19 ENCOUNTER — Encounter: Payer: Self-pay | Admitting: Medical

## 2018-07-19 ENCOUNTER — Telehealth: Payer: Self-pay | Admitting: Family

## 2018-07-19 ENCOUNTER — Ambulatory Visit (INDEPENDENT_AMBULATORY_CARE_PROVIDER_SITE_OTHER): Payer: Medicare HMO | Admitting: Medical

## 2018-07-19 ENCOUNTER — Telehealth: Payer: Self-pay | Admitting: Medical

## 2018-07-19 VITALS — BP 124/84 | Temp 98.5°F | Ht 62.0 in | Wt 182.0 lb

## 2018-07-19 DIAGNOSIS — J40 Bronchitis, not specified as acute or chronic: Secondary | ICD-10-CM

## 2018-07-19 MED ORDER — AZITHROMYCIN 250 MG PO TABS: ORAL_TABLET | ORAL | 0 refills | Status: DC

## 2018-07-19 MED ORDER — BENZONATATE 100 MG PO CAPS: 100.0000 mg | ORAL_CAPSULE | Freq: Three times a day (TID) | ORAL | 0 refills | Status: DC | PRN

## 2018-07-19 MED ORDER — VENTOLIN HFA 108 (90 BASE) MCG/ACT IN AERS: 2.0000 | INHALATION_SPRAY | Freq: Four times a day (QID) | RESPIRATORY_TRACT | 5 refills | Status: DC | PRN

## 2018-07-19 NOTE — Telephone Encounter (Signed)
Unable to leave VM for pt to schedule follow up, pt phone rings and then disconnects

## 2018-07-19 NOTE — Telephone Encounter (Signed)
I printed her work note. She should be calling to give [place] fax number.

## 2018-07-19 NOTE — Progress Notes (Signed)
   Subjective:    Patient ID: [name], female    DOB: 10-07-1980, 38 y.o.   MRN: [id number]  HPI  Virtual Visit via Video Note  I connected with [name] on 07/19/18 at 10:20 AM EDT by a video enabled telemedicine application and verified that I am speaking with the correct person using two identifiers.  Location: Patient: home Provider: office   I discussed the limitations of evaluation and management by telemedicine and the availability of in person appointments. The patient expressed understanding and agreed to proceed.  History of Present Illness:  Pt states she has had some cough with some rattle in her chest. Some slight productive cough. Mild nasal congestion. No wheezing but some sob last night. Pt does not have pulse ox at her house. Pt does smoke. This morning when walking around house no sob. Sob was last night when lying in bed. No swelling of legs or pain behind knees. Pt has been workng 30 hours a week. 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 No fever, no chills or sweats.  Pt does get bronchiits easily in past. Pt had quit smoking and then restarted around January. Had stopped a year prior. In past would need to use inhaler with bronchitis.  LMP- has mirena.    Observations/Objective: General-no acute distress, pleasant, oriented. Lungs- on inspection lungs appear unlabored. Neck- no tracheal deviation or jvd on inspection. Neuro- gross motor function appears intact.  Assessment and Plan: You do appear to have bronchitis type symptoms with mild sob last night. Hx of some wheezing in past with bronchitis. Will rx azithromycin antibiotic, benzonatate cough med and ventolin inhaler.  Work excuse/off for at least 1 week. Please call and give [place] fax number so we can fax work note.  Will follow you closely and if not improving by Monday then would try to get you scheduled for drive thru testing. If you were to have severe change in  symptoms then recommend ED evaluation.  Follow up in 6 days or as needed  Follow Up Instructions:    I discussed the assessment and treatment plan with the patient. The patient was provided an opportunity to ask questions and all were answered. The patient agreed with the plan and demonstrated an understanding of the instructions.   The patient was advised to call back or seek an in-person evaluation if the symptoms worsen or if the condition fails to improve as anticipated.  I provided 15 minutes of non-face-to-face time during this encounter.   Mackie Pai, PA-C   Review of Systems     Objective:   Physical Exam        Assessment & Plan:

## 2018-07-19 NOTE — Patient Instructions (Addendum)
You do appear to have bronchitis type symptoms with mild sob last night. Hx of some wheezing in past with bronchitis. Will rx azithromycin antibiotic, benzonatate cough med and ventolin inhaler.  Work excuse/off for at least 1 week. Please call and give [place] fax number so we can fax work note.  Will follow you closely and if not improving by Monday then would try to get you scheduled for drive thru testing. If you were to have severe change in symptoms then recommend ED evaluation.  Follow up in 6 days or as needed

## 2018-07-19 NOTE — Telephone Encounter (Signed)
Copied from [place] [id number]. Topic: Quick Communication - See Telephone Encounter >> Jul 19, 2018 10:51 AM Loma Boston wrote: CRM for notification. See Telephone encounter for: 07/19/18.azithromycin (ZITHROMAX) 250 MG tablet [facility] calling, needs a Diagnostic code on listed drug. Call [place] at [phone]   Diagnosis is J40, bronchitis. Please notify pharmacy.

## 2018-07-21 ENCOUNTER — Telehealth: Payer: Self-pay | Admitting: Internal Medicine

## 2018-07-21 NOTE — Telephone Encounter (Signed)
Call from [facility] She started the z-pak---but then lost it. Requests another prescription so she can finish out the Rx  I gave okay for the [facility] RN to refill the prescription

## 2018-07-22 NOTE — Telephone Encounter (Signed)
Thanks for refilling the azithromycin.

## 2018-07-26 DIAGNOSIS — M545 Low back pain: Secondary | ICD-10-CM | POA: Diagnosis not present

## 2018-07-26 DIAGNOSIS — M79604 Pain in right leg: Secondary | ICD-10-CM | POA: Diagnosis not present

## 2018-07-26 DIAGNOSIS — Z79891 Long term (current) use of opiate analgesic: Secondary | ICD-10-CM | POA: Diagnosis not present

## 2018-07-26 DIAGNOSIS — G894 Chronic pain syndrome: Secondary | ICD-10-CM | POA: Diagnosis not present

## 2018-07-26 DIAGNOSIS — M25512 Pain in left shoulder: Secondary | ICD-10-CM | POA: Diagnosis not present

## 2018-08-27 DIAGNOSIS — M79604 Pain in right leg: Secondary | ICD-10-CM | POA: Diagnosis not present

## 2018-08-27 DIAGNOSIS — Z79891 Long term (current) use of opiate analgesic: Secondary | ICD-10-CM | POA: Diagnosis not present

## 2018-08-27 DIAGNOSIS — M25512 Pain in left shoulder: Secondary | ICD-10-CM | POA: Diagnosis not present

## 2018-08-27 DIAGNOSIS — M545 Low back pain: Secondary | ICD-10-CM | POA: Diagnosis not present

## 2018-08-27 DIAGNOSIS — G894 Chronic pain syndrome: Secondary | ICD-10-CM | POA: Diagnosis not present

## 2018-09-12 ENCOUNTER — Other Ambulatory Visit: Payer: Self-pay

## 2018-09-12 ENCOUNTER — Ambulatory Visit (INDEPENDENT_AMBULATORY_CARE_PROVIDER_SITE_OTHER): Payer: Medicare HMO | Admitting: Family

## 2018-09-12 DIAGNOSIS — J40 Bronchitis, not specified as acute or chronic: Secondary | ICD-10-CM | POA: Diagnosis not present

## 2018-09-12 MED ORDER — PREDNISONE 10 MG PO TABS: ORAL_TABLET | ORAL | 0 refills | Status: DC

## 2018-09-12 MED ORDER — OMEPRAZOLE 40 MG PO CPDR: 40.0000 mg | DELAYED_RELEASE_CAPSULE | Freq: Every day | ORAL | 5 refills | Status: DC

## 2018-09-12 MED ORDER — BUDESONIDE-FORMOTEROL FUMARATE 80-4.5 MCG/ACT IN AERO: 2.0000 | INHALATION_SPRAY | Freq: Two times a day (BID) | RESPIRATORY_TRACT | 3 refills | Status: DC

## 2018-09-12 MED ORDER — AZITHROMYCIN 250 MG PO TABS: ORAL_TABLET | ORAL | 0 refills | Status: DC

## 2018-09-12 MED ORDER — SUMATRIPTAN SUCCINATE 50 MG PO TABS: 50.0000 mg | ORAL_TABLET | ORAL | 5 refills | Status: DC | PRN

## 2018-09-12 NOTE — Progress Notes (Signed)
Virtual Visit via Video Note  I connected with [name] on 09/12/18 at  8:40 AM EDT by a video enabled telemedicine application and verified that I am speaking with the correct person using two identifiers.  Location: Patient: home Provider: office   I discussed the limitations of evaluation and management by telemedicine and the availability of in person appointments. The patient expressed understanding and agreed to proceed.  History of Present Illness:  Patient is a 38 yr old female who presents today with several concerns.  She reports that she has had a dry cough.  Feels "like somethings on my chest."  Feels like wearing her mask is making it harder to breath.  Reports "rattling" in her chest.  Denies fever or body aches.  Reports mild allergy symptoms.  She continues claritin.    GERD- reports that she   Works at [employer].  Does have interaction with the public but wears her mask.     Observations/Objective:   Gen: Awake, alert, no acute distress Resp: Breathing is even and non-labored Psych: calm/pleasant demeanor Neuro: Alert and Oriented x 3, + facial symmetry, speech is clear.   Assessment and Plan:  Bronchitis- Will rx with azithromycin, pred taper and albuterol prn.  Will also add symbicort for maintenance.  Patient is advised to call if symptoms worse or if symptoms fail to improve.   Follow Up Instructions:    I discussed the assessment and treatment plan with the patient. The patient was provided an opportunity to ask questions and all were answered. The patient agreed with the plan and demonstrated an understanding of the instructions.   The patient was advised to call back or seek an in-person evaluation if the symptoms worsen or if the condition fails to improve as anticipated.  Nance Pear, NP

## 2018-09-20 DIAGNOSIS — M25512 Pain in left shoulder: Secondary | ICD-10-CM | POA: Diagnosis not present

## 2018-09-20 DIAGNOSIS — M545 Low back pain: Secondary | ICD-10-CM | POA: Diagnosis not present

## 2018-09-20 DIAGNOSIS — G894 Chronic pain syndrome: Secondary | ICD-10-CM | POA: Diagnosis not present

## 2018-09-20 DIAGNOSIS — M79604 Pain in right leg: Secondary | ICD-10-CM | POA: Diagnosis not present

## 2018-09-20 DIAGNOSIS — Z79891 Long term (current) use of opiate analgesic: Secondary | ICD-10-CM | POA: Diagnosis not present

## 2018-10-16 ENCOUNTER — Encounter: Payer: Medicare HMO | Admitting: Family

## 2018-10-18 DIAGNOSIS — M79604 Pain in right leg: Secondary | ICD-10-CM | POA: Diagnosis not present

## 2018-10-18 DIAGNOSIS — M25512 Pain in left shoulder: Secondary | ICD-10-CM | POA: Diagnosis not present

## 2018-10-18 DIAGNOSIS — G894 Chronic pain syndrome: Secondary | ICD-10-CM | POA: Diagnosis not present

## 2018-10-18 DIAGNOSIS — M545 Low back pain: Secondary | ICD-10-CM | POA: Diagnosis not present

## 2018-10-18 DIAGNOSIS — Z79891 Long term (current) use of opiate analgesic: Secondary | ICD-10-CM | POA: Diagnosis not present

## 2018-11-07 ENCOUNTER — Encounter: Payer: Self-pay | Admitting: Family

## 2018-11-07 ENCOUNTER — Other Ambulatory Visit: Payer: Self-pay

## 2018-11-07 ENCOUNTER — Ambulatory Visit (INDEPENDENT_AMBULATORY_CARE_PROVIDER_SITE_OTHER): Payer: Medicare HMO | Admitting: Family

## 2018-11-07 VITALS — BP 131/63 | HR 78 | Temp 96.5°F | Resp 16 | Ht 63.0 in | Wt 185.2 lb

## 2018-11-07 DIAGNOSIS — Z0000 Encounter for general adult medical examination without abnormal findings: Secondary | ICD-10-CM | POA: Diagnosis not present

## 2018-11-07 DIAGNOSIS — Z23 Encounter for immunization: Secondary | ICD-10-CM | POA: Diagnosis not present

## 2018-11-07 DIAGNOSIS — G5603 Carpal tunnel syndrome, bilateral upper limbs: Secondary | ICD-10-CM

## 2018-11-07 DIAGNOSIS — E559 Vitamin D deficiency, unspecified: Secondary | ICD-10-CM

## 2018-11-07 DIAGNOSIS — E785 Hyperlipidemia, unspecified: Secondary | ICD-10-CM

## 2018-11-07 DIAGNOSIS — Z862 Personal history of diseases of the blood and blood-forming organs and certain disorders involving the immune mechanism: Secondary | ICD-10-CM

## 2018-11-07 DIAGNOSIS — Z30431 Encounter for routine checking of intrauterine contraceptive device: Secondary | ICD-10-CM

## 2018-11-07 NOTE — Patient Instructions (Addendum)
Please schedule a routine eye exam. Please complete lab work prior to leaving. Continue to work on [employer], exercise and weight loss.

## 2018-11-07 NOTE — Addendum Note (Signed)
Addended by: Kelle Darting A on: 11/07/2018 09:14 AM   Modules accepted: Orders

## 2018-11-07 NOTE — Progress Notes (Signed)
Subjective:    Patient ID: [name], female    DOB: 12/05/1980, 38 y.o.   MRN: [id number]  HPI  Patient presents today for complete physical.  Immunizations: 2015, flu shot today, Due for pneumovax 23 booster Diet: trying to eat healthy Exercise:  Active in her job Pap Smear:88/18- sees GYN Mammogram: due Dental: up to date, will have tooth extracted by oral surgeon and get a partial denture Vision: due Wt Readings from Last 3 Encounters:  11/07/18 185 lb 3.2 oz (84 kg)  07/19/18 182 lb (82.6 kg)  07/26/17 202 lb 12.8 oz (92 kg)      Review of Systems  Constitutional: Negative for unexpected weight change.  HENT: Negative for hearing loss.   Eyes: Negative for visual disturbance.  Respiratory: Negative for cough and shortness of breath.   Cardiovascular: Negative for chest pain and leg swelling.  Genitourinary: Negative for dysuria, frequency and hematuria.  Skin: Negative for rash.  Neurological:       Reports that her hands feel like they have "electricity going through them", hands go numb frequently  Hematological: Negative for adenopathy.  Psychiatric/Behavioral:       Denies depression/anxiety       Past Medical History:  Diagnosis Date  . Anxiety    no meds  . Asthma    "very badly" (01/17/2012)  . Cervical cancer ([place]) 1999; 2000  . CHF (congestive heart failure) ([place]) 2006   Resolved -History"with 2006 pregnancy, toxcemia "  not seeing a cardiologist now.  . Chronic back pain   . Chronic bronchitis ([place])    "@ least once/yr" (01/17/2012) - none since 01/2012  . Complication of anesthesia    back surgeries x 6 - fusions T8-S2- rods  . Degenerative disk disease   . Difficult intubation    "needs small tube"  . Family history of anesthesia complication    Nausea  . GERD (gastroesophageal reflux disease)   . Migraines    last migraine 09/2013  . Neuromuscular disorder ([facility])    sciatica  - affecting left leg numb/pain  . Pneumonia    "~  once/yr" (01/17/2012) none since 01/2012  . PONV (postoperative nausea and vomiting)   . Respiratory failure ([place]) 11/2010; 09/2011   Resolved  - History pneumonia     Social History   Socioeconomic History  . Marital status: Married    Spouse name: Not on file  . Number of children: 1  . Years of education: Not on file  . Highest education level: Not on file  Occupational History  . Occupation: Disabled  Social Needs  . Financial resource strain: Not on file  . Food insecurity    Worry: Not on file    Inability: Not on file  . Transportation needs    Medical: Not on file    Non-medical: Not on file  Tobacco Use  . Smoking status: Former Smoker    Years: 15.00    Types: Cigarettes    Quit date: 07/06/2017    Years since quitting: 1.3  . Smokeless tobacco: Never Used  . Tobacco comment: 3-4 cigarettes a day  Substance and Sexual Activity  . Alcohol use: No  . Drug use: No  . Sexual activity: Yes    Partners: Male    Birth control/protection: I.U.D.  Lifestyle  . Physical activity    Days per week: Not on file    Minutes per session: Not on file  . Stress: Not on file  Relationships  . Social Herbalist on phone: Not on file    Gets together: Not on file    Attends religious service: Not on file    Active member of club or organization: Not on file    Attends meetings of clubs or organizations: Not on file    Relationship status: Not on file  . Intimate partner violence    Fear of current or ex partner: Not on file    Emotionally abused: Not on file    Physically abused: Not on file    Forced sexual activity: Not on file  Other Topics Concern  . Not on file  Social History Narrative   Married, 2 children   disabled    Past Surgical History:  Procedure Laterality Date  . ADENOIDECTOMY, TONSILLECTOMY AND MYRINGOTOMY WITH TUBE PLACEMENT  1983?  . ANTERIOR LUMBAR FUSION  01/17/2012   Procedure: ANTERIOR LUMBAR FUSION 2 LEVELS;  Surgeon: Lowella Grip, MD;  Location: [place];  Service: Orthopedics;  Laterality: Bilateral;  Anterior Lumbar 3-4, Lumbar 4-5 fusion with bone graft harvest  . ANTERIOR LUMBAR FUSION    . BACK SURGERY  02/19/2014  . CERVICAL CONE BIOPSY  1999; 2000   cervical cancer  . CESAREAN SECTION N/A 11/08/2013   Procedure: REPEAT CESAREAN SECTION;  Surgeon: Shelly Bombard, MD;  Location: [facility];  Service: Obstetrics;  Laterality: N/A;  . KNEE ARTHROSCOPY  2004 X 2   "left X 2" (01/17/2012)  . LUMBAR DISC SURGERY  06/1998   "L4-5" (01/17/2012)  . LUMBAR FUSION  08/1998; 2010; 2012; 06/2011, 02/19/14   "fusion cage; put rods in; reanchored screws; rod broke" (01/17/2012); Had son in 10/2013 and rods in back broke.   Marland Kitchen SPINAL FUSION  10/04/2011   Procedure: FUSION POSTERIOR SPINAL MULTILEVEL;  Surgeon: Lowella Grip, MD;  Location: [place];  Service: Orthopedics;  Laterality: Bilateral;  repair lumbar pseudoarthritis, revision segmental fixation, L3-L4, L4-L5  . SPINAL FUSION  07/06/2015   T7 - S2  . TONSILLECTOMY      Family History  Problem Relation Age of Onset  . COPD Mother   . Breast cancer Mother   . Heart disease Father 34  . Hypertension Father   . Diabetes Father        ESRD on dialysis  . Deep vein thrombosis Father   . Lung cancer Father   . Cancer Other   . Heart attack Sister 31       Died with MI    Allergies  Allergen Reactions  . Iodine Itching and Rash    Pt states she has used iodine and has had no reaction, she is allergic to steri strips. 08-30-16  . Ultram [Tramadol] Itching  . Other Rash    Compound Tincture of Benzoin     Current Outpatient Medications on File Prior to Visit  Medication Sig Dispense Refill  . budesonide-formoterol (SYMBICORT) 80-4.5 MCG/ACT inhaler Inhale 2 puffs into the lungs 2 (two) times daily. 1 Inhaler 3  . Cholecalciferol (VITAMIN D PO) Take 500 mg by mouth 2 (two) times daily.    Marland Kitchen loratadine (CLARITIN) 10 MG tablet TAKE 1 TABLET BY MOUTH ONCE DAILY  30 tablet 10  . omeprazole (PRILOSEC) 40 MG capsule Take 1 capsule (40 mg total) by mouth daily. 30 capsule 5  . oxyCODONE (ROXICODONE) 15 MG immediate release tablet Take 15 mg by mouth every 4 (four) hours as needed for pain.    Marland Kitchen  predniSONE (DELTASONE) 10 MG tablet 4 tabs by mouth once daily for 2 days, then 3 tabs daily x 2 days, then 2 tabs daily x 2 days, then 1 tab daily x 2 days 20 tablet 0  . SUMAtriptan (IMITREX) 50 MG tablet Take 1 tablet (50 mg total) by mouth every 2 (two) hours as needed for migraine. May repeat in 2 hours if headache persists or recurs. 10 tablet 5  . VENTOLIN HFA 108 (90 Base) MCG/ACT inhaler Inhale 2 puffs into the lungs every 6 (six) hours as needed for wheezing or shortness of breath. 1 Inhaler 5   No current facility-administered medications on file prior to visit.     BP 131/63 (BP Location: Right Arm, Patient Position: Sitting, Cuff Size: Small)   Pulse 78   Temp (!) 96.5 F (35.8 C) (Temporal)   Resp 16   Ht 5\' 3"  (1.6 m)   Wt 185 lb 3.2 oz (84 kg)   SpO2 100%   BMI 32.81 kg/m    Objective:   Physical Exam  Physical Exam  Constitutional: She is oriented to person, place, and time. She appears well-developed and well-nourished. No distress.  HENT:  Head: Normocephalic and atraumatic.  Right Ear: Tympanic membrane and ear canal normal.  Left Ear: Tympanic membrane and ear canal normal.  Mouth/Throat: Oropharynx is clear and moist.  Eyes: Pupils are equal, round, and reactive to light. No scleral icterus.  Neck: Normal range of motion. No thyromegaly present.  Cardiovascular: Normal rate and regular rhythm.   No murmur heard. Pulmonary/Chest: Effort normal and breath sounds normal. No respiratory distress. He has no wheezes. She has no rales. She exhibits no tenderness.  Abdominal: Soft. Bowel sounds are normal. She exhibits no distension and no mass. There is no tenderness. There is no rebound and no guarding.  Musculoskeletal: She exhibits  no edema.  Lymphadenopathy:    She has no cervical adenopathy.  Neurological: She is alert and oriented to person, place, and time. She has normal patellar reflexes. She exhibits normal muscle tone. Coordination normal. + tinnels bilaterally Skin: Skin is warm and dry.  Psychiatric: She has a normal mood and affect. Her behavior is normal. Judgment and thought content normal.  Breasts: Examined lying Right: Without masses, retractions, discharge or axillary adenopathy.  Left: Without masses, retractions, discharge or axillary adenopathy.  Pelvic: deferred          Assessment & Plan:         Assessment & Plan:  Bilateral Carpal tunnel syndrome- refer to sports medicine.    Preventative care- flu shot today. Tetanus up to date.  Due for pneumovax booster but unfortunately we are out of stock today. Refer to GYN for pap and IUD exchange.

## 2018-11-08 ENCOUNTER — Ambulatory Visit: Payer: Medicare HMO | Admitting: Family Medicine

## 2018-11-13 ENCOUNTER — Other Ambulatory Visit: Payer: Medicare HMO

## 2018-11-13 ENCOUNTER — Ambulatory Visit: Payer: Medicare HMO

## 2018-11-14 ENCOUNTER — Ambulatory Visit: Payer: Medicare HMO

## 2018-11-15 DIAGNOSIS — G894 Chronic pain syndrome: Secondary | ICD-10-CM | POA: Diagnosis not present

## 2018-11-15 DIAGNOSIS — M25512 Pain in left shoulder: Secondary | ICD-10-CM | POA: Diagnosis not present

## 2018-11-15 DIAGNOSIS — M545 Low back pain: Secondary | ICD-10-CM | POA: Diagnosis not present

## 2018-11-15 DIAGNOSIS — Z79891 Long term (current) use of opiate analgesic: Secondary | ICD-10-CM | POA: Diagnosis not present

## 2018-11-15 DIAGNOSIS — M79604 Pain in right leg: Secondary | ICD-10-CM | POA: Diagnosis not present

## 2018-11-20 ENCOUNTER — Other Ambulatory Visit: Payer: Self-pay

## 2018-11-20 ENCOUNTER — Ambulatory Visit (INDEPENDENT_AMBULATORY_CARE_PROVIDER_SITE_OTHER): Payer: Medicare HMO | Admitting: Family Medicine

## 2018-11-20 ENCOUNTER — Encounter: Payer: Self-pay | Admitting: Family Medicine

## 2018-11-20 VITALS — BP 125/77 | HR 79 | Ht 62.0 in | Wt 182.0 lb

## 2018-11-20 DIAGNOSIS — G5603 Carpal tunnel syndrome, bilateral upper limbs: Secondary | ICD-10-CM

## 2018-11-20 MED ORDER — PREDNISONE 5 MG PO TABS: ORAL_TABLET | ORAL | 0 refills | Status: DC

## 2018-11-20 NOTE — Progress Notes (Signed)
[name] - 38 y.o. female MRN [id number]  Date of birth: Aug 25, 1980  SUBJECTIVE:  Including CC & ROS.  Chief Complaint  Patient presents with  . Wrist Pain    bilateral wrist    [name] is a 38 y.o. female that is presenting with bilateral hand changes.  The sensations have been getting worse since May.  She feels it in the first 3 digits and sometimes in the ring finger of each hand.  Is on the palmar aspect.  The left seems to be worse than the right.  She is right-handed.  She works at [employer] and has to pick things up.  Her symptoms seem to be worse with lifting things but as well as at night.  Has not tried using splints at night.  Denies any specific inciting event.  No prior history of neck surgery.  Has had several back surgeries.  Has not been dropping anything.  She does have to sleep with her arm straight and does seem to help her symptoms.  Has not tried any medications that have helped thus far.   Review of Systems  Constitutional: Negative for fever.  HENT: Negative for congestion.   Respiratory: Negative for cough.   Cardiovascular: Negative for chest pain.  Gastrointestinal: Negative for abdominal pain.  Musculoskeletal: Positive for back pain and gait problem.  Neurological: Positive for numbness.  Hematological: Negative for adenopathy.    HISTORY: Past Medical, Surgical, Social, and Family History Reviewed & Updated per EMR.   Pertinent Historical Findings include:  Past Medical History:  Diagnosis Date  . Anxiety    no meds  . Asthma    "very badly" (01/17/2012)  . Cervical cancer ([place]) 1999; 2000  . CHF (congestive heart failure) ([place]) 2006   Resolved -History"with 2006 pregnancy, toxcemia "  not seeing a cardiologist now.  . Chronic back pain   . Chronic bronchitis ([place])    "@ least once/yr" (01/17/2012) - none since 01/2012  . Complication of anesthesia    back surgeries x 6 - fusions T8-S2- rods  . Degenerative disk  disease   . Difficult intubation    "needs small tube"  . Family history of anesthesia complication    Nausea  . GERD (gastroesophageal reflux disease)   . Migraines    last migraine 09/2013  . Neuromuscular disorder ([facility])    sciatica  - affecting left leg numb/pain  . Pneumonia    "~ once/yr" (01/17/2012) none since 01/2012  . PONV (postoperative nausea and vomiting)   . Respiratory failure ([place]) 11/2010; 09/2011   Resolved  - History pneumonia    Past Surgical History:  Procedure Laterality Date  . ADENOIDECTOMY, TONSILLECTOMY AND MYRINGOTOMY WITH TUBE PLACEMENT  1983?  . ANTERIOR LUMBAR FUSION  01/17/2012   Procedure: ANTERIOR LUMBAR FUSION 2 LEVELS;  Surgeon: Lowella Grip, MD;  Location: [facility];  Service: Orthopedics;  Laterality: Bilateral;  Anterior Lumbar 3-4, Lumbar 4-5 fusion with bone graft harvest  . ANTERIOR LUMBAR FUSION    . BACK SURGERY  02/19/2014  . CERVICAL CONE BIOPSY  1999; 2000   cervical cancer  . CESAREAN SECTION N/A 11/08/2013   Procedure: REPEAT CESAREAN SECTION;  Surgeon: Shelly Bombard, MD;  Location: [facility];  Service: Obstetrics;  Laterality: N/A;  . KNEE ARTHROSCOPY  2004 X 2   "left X 2" (01/17/2012)  . LUMBAR DISC SURGERY  06/1998   "L4-5" (01/17/2012)  . LUMBAR FUSION  08/1998; 2010; 2012;  06/2011, 02/19/14   "fusion cage; put rods in; reanchored screws; rod broke" (01/17/2012); Had son in 10/2013 and rods in back broke.   Marland Kitchen SPINAL FUSION  10/04/2011   Procedure: FUSION POSTERIOR SPINAL MULTILEVEL;  Surgeon: Lowella Grip, MD;  Location: [facility];  Service: Orthopedics;  Laterality: Bilateral;  repair lumbar pseudoarthritis, revision segmental fixation, L3-L4, L4-L5  . SPINAL FUSION  07/06/2015   T7 - S2  . TONSILLECTOMY      Allergies  Allergen Reactions  . Iodine Itching and Rash    Pt states she has used iodine and has had no reaction, she is allergic to steri strips. 08-30-16  . Ultram [Tramadol] Itching  . Other Rash    Compound Tincture  of Benzoin     Family History  Problem Relation Age of Onset  . COPD Mother   . Breast cancer Mother   . Heart disease Father 52  . Hypertension Father   . Diabetes Father        ESRD on dialysis  . Deep vein thrombosis Father   . Lung cancer Father   . Cancer Other   . Heart attack Sister 84       Died with MI  . Drug abuse Brother   . Depression Brother   . Hyperlipidemia Brother   . Hypertension Brother      Social History   Socioeconomic History  . Marital status: Married    Spouse name: Not on file  . Number of children: 1  . Years of education: Not on file  . Highest education level: Not on file  Occupational History  . Occupation: Disabled  Social Needs  . Financial resource strain: Not on file  . Food insecurity    Worry: Not on file    Inability: Not on file  . Transportation needs    Medical: Not on file    Non-medical: Not on file  Tobacco Use  . Smoking status: Current Every Day Smoker    Years: 15.00    Types: Cigarettes    Last attempt to quit: 07/06/2017    Years since quitting: 1.3  . Smokeless tobacco: Never Used  . Tobacco comment: 1/4-1/2 PPD  Substance and Sexual Activity  . Alcohol use: No  . Drug use: No  . Sexual activity: Yes    Partners: Male    Birth control/protection: I.U.D.  Lifestyle  . Physical activity    Days per week: Not on file    Minutes per session: Not on file  . Stress: Not on file  Relationships  . Social Herbalist on phone: Not on file    Gets together: Not on file    Attends religious service: Not on file    Active member of club or organization: Not on file    Attends meetings of clubs or organizations: Not on file    Relationship status: Not on file  . Intimate partner violence    Fear of current or ex partner: Not on file    Emotionally abused: Not on file    Physically abused: Not on file    Forced sexual activity: Not on file  Other Topics Concern  . Not on file  Social History  Narrative   Married, 2 children   disabled     PHYSICAL EXAM:  VS: BP 125/77   Pulse 79   Ht 5\' 2"  (1.575 m)   Wt 182 lb (82.6 kg)   BMI 33.29 kg/m  Physical Exam Gen: NAD, alert, cooperative with exam, well-appearing ENT: normal lips, normal nasal mucosa,  Eye: normal EOM, normal conjunctiva and lids CV:  no edema, +2 pedal pulses   Resp: no accessory muscle use, non-labored,  Skin: no rashes, no areas of induration  Neuro: normal tone, normal sensation to touch Psych:  normal insight, alert and oriented MSK:  Left and right hand: No signs of atrophy. Normal strength resistance with thumb extension. Normal strength resisted finger abduction abduction. Normal wrist range of motion. Normal grip strength. Positive Tinel's bilaterally. Neurovascular intact     ASSESSMENT & PLAN:   Bilateral carpal tunnel syndrome Symptoms seem to be consistent with carpal tunnel syndrome.  Denies history of surgery.  Has been ongoing since May. -Prednisone. -Provided splint.  Counseled on their use. -Counseled on home exercise therapy and supportive care. -If no improvement can consider injection and physical therapy.

## 2018-11-20 NOTE — Patient Instructions (Signed)
Nice to meet you Please try to wear the splints at night and during the day. You can switch to only wearing them at night after 2 weeks.  Please try the exercises at work.  Please try voltaren over the counter to rub on the area   Please send me a message in MyChart with any questions or updates.  Please see me back in 4 weeks or sooner if needed.   --Dr. Raeford Razor

## 2018-11-20 NOTE — Assessment & Plan Note (Signed)
Symptoms seem to be consistent with carpal tunnel syndrome.  Denies history of surgery.  Has been ongoing since May. -Prednisone. -Provided splint.  Counseled on their use. -Counseled on home exercise therapy and supportive care. -If no improvement can consider injection and physical therapy.

## 2018-11-21 ENCOUNTER — Encounter: Payer: Self-pay | Admitting: Obstetrics

## 2018-11-21 ENCOUNTER — Other Ambulatory Visit: Payer: Medicare HMO

## 2018-11-21 ENCOUNTER — Ambulatory Visit (INDEPENDENT_AMBULATORY_CARE_PROVIDER_SITE_OTHER): Payer: Medicare HMO | Admitting: Obstetrics

## 2018-11-21 ENCOUNTER — Other Ambulatory Visit (HOSPITAL_COMMUNITY)
Admission: RE | Admit: 2018-11-21 | Discharge: 2018-11-21 | Disposition: A | Discharge status: Home / Self Care | Payer: Medicare HMO | Source: Ambulatory Visit | Attending: Obstetrics | Admitting: Obstetrics

## 2018-11-21 ENCOUNTER — Ambulatory Visit: Payer: Medicare HMO | Admitting: Family Medicine

## 2018-11-21 VITALS — BP 125/88 | HR 83 | Ht 62.0 in | Wt 184.0 lb

## 2018-11-21 DIAGNOSIS — Z124 Encounter for screening for malignant neoplasm of cervix: Secondary | ICD-10-CM | POA: Diagnosis not present

## 2018-11-21 DIAGNOSIS — N898 Other specified noninflammatory disorders of vagina: Secondary | ICD-10-CM | POA: Diagnosis present

## 2018-11-21 DIAGNOSIS — N760 Acute vaginitis: Secondary | ICD-10-CM | POA: Insufficient documentation

## 2018-11-21 DIAGNOSIS — B9689 Other specified bacterial agents as the cause of diseases classified elsewhere: Secondary | ICD-10-CM | POA: Diagnosis not present

## 2018-11-21 DIAGNOSIS — N882 Stricture and stenosis of cervix uteri: Secondary | ICD-10-CM

## 2018-11-21 DIAGNOSIS — Z01419 Encounter for gynecological examination (general) (routine) without abnormal findings: Secondary | ICD-10-CM

## 2018-11-21 DIAGNOSIS — Z30431 Encounter for routine checking of intrauterine contraceptive device: Secondary | ICD-10-CM

## 2018-11-21 NOTE — Progress Notes (Signed)
Patient Presents for Annual Exam Today.  Last Pap: 08/30/2016 WNL  LMP: No cycles with IUD ot has noticed spotting.  Contraception: IUD Mirena  Mammogram : Scheduled  Per pt    CC: No GYN concerns today.[name]

## 2018-11-21 NOTE — Progress Notes (Addendum)
Subjective:        [name] is a 38 y.o. female here for a routine exam.  Current complaints: None  Personal health questionnaire:  Is patient [name], have a family history of breast and/or ovarian cancer: no Is there a family history of uterine cancer diagnosed at age < 10, gastrointestinal cancer, urinary tract cancer, family member who is a Field seismologist syndrome-associated carrier: no Is the patient overweight and hypertensive, family history of diabetes, personal history of gestational diabetes, preeclampsia or PCOS: no Is patient over 3, have PCOS,  family history of premature CHD under age 50, diabetes, smoke, have hypertension or peripheral artery disease:  no At any time, has a partner hit, kicked or otherwise hurt or frightened you?: no Over the past 2 weeks, have you felt down, depressed or hopeless?: no Over the past 2 weeks, have you felt little interest or pleasure in doing things?:no   Gynecologic History No LMP recorded. (Menstrual status: IUD). Contraception: IUD Last Pap: 08-30-2016. Results were: normal Last mammogram: 09-07-2015. Results were: normal  Obstetric History OB History  Gravida Para Term Preterm AB Living  3 2 2   1 2   SAB TAB Ectopic Multiple Live Births  1       2    # Outcome Date GA Lbr Len/2nd Weight Sex Delivery Anes PTL Lv  3 Term 11/08/13 [redacted]w[redacted]d  6 lb 4.9 oz (2.86 kg) M CS-LTranv Gen  LIV  2 Term 09/19/04 [redacted]w[redacted]d  6 lb 6 oz (2.892 kg) M CS-LTranv EPI  LIV  1 SAB 1996 [redacted]w[redacted]d           Past Medical History:  Diagnosis Date  . Anxiety    no meds  . Asthma    "very badly" (01/17/2012)  . Cervical cancer ([facility]) 1999; 2000  . CHF (congestive heart failure) ([place]) 2006   Resolved -History"with 2006 pregnancy, toxcemia "  not seeing a cardiologist now.  . Chronic back pain   . Chronic bronchitis ([place])    "@ least once/yr" (01/17/2012) - none since 01/2012  . Complication of anesthesia    back surgeries x 6 - fusions T8-S2- rods   . Degenerative disk disease   . Difficult intubation    "needs small tube"  . Family history of anesthesia complication    Nausea  . GERD (gastroesophageal reflux disease)   . Migraines    last migraine 09/2013  . Neuromuscular disorder ([facility])    sciatica  - affecting left leg numb/pain  . Pneumonia    "~ once/yr" (01/17/2012) none since 01/2012  . PONV (postoperative nausea and vomiting)   . Respiratory failure ([place]) 11/2010; 09/2011   Resolved  - History pneumonia    Past Surgical History:  Procedure Laterality Date  . ADENOIDECTOMY, TONSILLECTOMY AND MYRINGOTOMY WITH TUBE PLACEMENT  1983?  . ANTERIOR LUMBAR FUSION  01/17/2012   Procedure: ANTERIOR LUMBAR FUSION 2 LEVELS;  Surgeon: Lowella Grip, MD;  Location: [facility];  Service: Orthopedics;  Laterality: Bilateral;  Anterior Lumbar 3-4, Lumbar 4-5 fusion with bone graft harvest  . ANTERIOR LUMBAR FUSION    . BACK SURGERY  02/19/2014  . CERVICAL CONE BIOPSY  1999; 2000   cervical cancer  . CESAREAN SECTION N/A 11/08/2013   Procedure: REPEAT CESAREAN SECTION;  Surgeon: Shelly Bombard, MD;  Location: [facility];  Service: Obstetrics;  Laterality: N/A;  . KNEE ARTHROSCOPY  2004 X 2   "left X 2" (01/17/2012)  . LUMBAR DISC  SURGERY  06/1998   "L4-5" (01/17/2012)  . LUMBAR FUSION  08/1998; 2010; 2012; 06/2011, 02/19/14   "fusion cage; put rods in; reanchored screws; rod broke" (01/17/2012); Had son in 10/2013 and rods in back broke.   Marland Kitchen SPINAL FUSION  10/04/2011   Procedure: FUSION POSTERIOR SPINAL MULTILEVEL;  Surgeon: Lowella Grip, MD;  Location: [facility];  Service: Orthopedics;  Laterality: Bilateral;  repair lumbar pseudoarthritis, revision segmental fixation, L3-L4, L4-L5  . SPINAL FUSION  07/06/2015   T7 - S2  . TONSILLECTOMY       Current Outpatient Medications:  .  budesonide-formoterol (SYMBICORT) 80-4.5 MCG/ACT inhaler, Inhale 2 puffs into the lungs 2 (two) times daily., Disp: 1 Inhaler, Rfl: 3 .  loratadine (CLARITIN) 10 MG  tablet, TAKE 1 TABLET BY MOUTH ONCE DAILY, Disp: 30 tablet, Rfl: 10 .  omeprazole (PRILOSEC) 40 MG capsule, Take 1 capsule (40 mg total) by mouth daily., Disp: 30 capsule, Rfl: 5 .  oxyCODONE (ROXICODONE) 15 MG immediate release tablet, Take 15 mg by mouth every 4 (four) hours as needed for pain., Disp: , Rfl:  .  predniSONE (DELTASONE) 5 MG tablet, Take 6 pills for first day, 5 pills second day, 4 pills third day, 3 pills fourth day, 2 pills the fifth day, and 1 pill sixth day., Disp: 21 tablet, Rfl: 0 .  SUMAtriptan (IMITREX) 50 MG tablet, Take 1 tablet (50 mg total) by mouth every 2 (two) hours as needed for migraine. May repeat in 2 hours if headache persists or recurs., Disp: 10 tablet, Rfl: 5 .  VENTOLIN HFA 108 (90 Base) MCG/ACT inhaler, Inhale 2 puffs into the lungs every 6 (six) hours as needed for wheezing or shortness of breath., Disp: 1 Inhaler, Rfl: 5 .  carisoprodol (SOMA) 350 MG tablet, , Disp: , Rfl:  .  Cholecalciferol (VITAMIN D PO), Take 500 mg by mouth 2 (two) times daily., Disp: , Rfl:  .  gabapentin (NEURONTIN) 300 MG capsule, , Disp: , Rfl:  .  misoprostol (CYTOTEC) 200 MCG tablet, 400 mcg ( 2 tablets ) vaginally 3-4 hours prior to scheduled procedure., Disp: 2 tablet, Rfl: 0 .  tinidazole (TINDAMAX) 500 MG tablet, Take 2 tablets (1,000 mg total) by mouth daily with breakfast., Disp: 10 tablet, Rfl: 2 Allergies  Allergen Reactions  . Iodine Itching and Rash    Pt states she has used iodine and has had no reaction, she is allergic to steri strips. 08-30-16  . Other Rash    Compound Tincture of Benzoin     Social History   Tobacco Use  . Smoking status: Current Every Day Smoker    Years: 15.00    Types: Cigarettes    Last attempt to quit: 07/06/2017    Years since quitting: 1.4  . Smokeless tobacco: Never Used  . Tobacco comment: 1/4-1/2 PPD  Substance Use Topics  . Alcohol use: No    Family History  Problem Relation Age of Onset  . COPD Mother   . Breast cancer  Mother   . Heart disease Father 82  . Hypertension Father   . Diabetes Father        ESRD on dialysis  . Deep vein thrombosis Father   . Lung cancer Father   . Cancer Other   . Heart attack Sister 69       Died with MI  . Drug abuse Brother   . Depression Brother   . Hyperlipidemia Brother   . Hypertension Brother  Review of Systems  Constitutional: negative for fatigue and weight loss Respiratory: negative for cough and wheezing Cardiovascular: negative for chest pain, fatigue and palpitations Gastrointestinal: negative for abdominal pain and change in bowel habits Musculoskeletal:negative for myalgias Neurological: negative for gait problems and tremors Behavioral/Psych: negative for abusive relationship, depression Endocrine: negative for temperature intolerance    Genitourinary:negative for abnormal menstrual periods, genital lesions, hot flashes, sexual problems and vaginal discharge Integument/breast: negative for breast lump, breast tenderness, nipple discharge and skin lesion(s)    Objective:       BP 125/88   Pulse 83   Ht 5\' 2"  (1.575 m)   Wt 184 lb (83.5 kg)   BMI 33.65 kg/m  General:   alert  Skin:   no rash or abnormalities  Lungs:   clear to auscultation bilaterally  Heart:   regular rate and rhythm, S1, S2 normal, no murmur, click, rub or gallop  Breasts:   normal without suspicious masses, skin or nipple changes or axillary nodes  Abdomen:  normal findings: no organomegaly, soft, non-tender and no hernia  Pelvis:  External genitalia: normal general appearance Urinary system: urethral meatus normal and bladder without fullness, nontender Vaginal: normal without tenderness, induration or masses Cervix: normal appearance.  Scarring and stenosis of os.  IUD string not visible. Adnexa: normal bimanual exam Uterus: anteverted and non-tender, normal size   Lab Review Urine pregnancy test Labs reviewed yes Radiologic studies reviewed yes  50% of  25 min visit spent on counseling and coordination of care.   Assessment:     1. Encounter for routine gynecological examination with Papanicolaou smear of cervix Rx: - Cytology - PAP  2. Surveillance of intrauterine contraception - Mirena IUD expires 02-14-2019.  She wants removal / reinsertion of IUD in January 2021  3. Vaginal discharge Rx: - Cervicovaginal ancillary only( [facility])  4. Cervical os stenosis and scarring - will need Cytotec 800 ug po 3 hours prior to IUD removal .   Plan:    Education reviewed: calcium supplements, depression evaluation, low fat, low cholesterol diet, safe sex/STD prevention, self breast exams and weight bearing exercise. Contraception: IUD. Mammogram ordered. Follow up in: 2 months.  Mirena IUD removal / reinsertion   No orders of the defined types were placed in this encounter.  No orders of the defined types were placed in this encounter.   Shelly Bombard, MD 12/24/2018 9:51 AM

## 2018-11-27 ENCOUNTER — Ambulatory Visit: Payer: Medicare HMO

## 2018-11-27 ENCOUNTER — Other Ambulatory Visit: Payer: Medicare HMO

## 2018-11-29 ENCOUNTER — Other Ambulatory Visit: Payer: Self-pay | Admitting: Obstetrics

## 2018-11-29 ENCOUNTER — Telehealth: Payer: Self-pay

## 2018-11-29 DIAGNOSIS — B9689 Other specified bacterial agents as the cause of diseases classified elsewhere: Secondary | ICD-10-CM

## 2018-11-29 LAB — CERVICOVAGINAL ANCILLARY ONLY
Bacterial Vaginitis (gardnerella): POSITIVE — AB
Candida Glabrata: NEGATIVE
Candida Vaginitis: NEGATIVE
Chlamydia: NEGATIVE
Comment: NEGATIVE
Comment: NEGATIVE
Comment: NEGATIVE
Comment: NEGATIVE
Comment: NEGATIVE
Comment: NORMAL
Neisseria Gonorrhea: NEGATIVE
Trichomonas: NEGATIVE

## 2018-11-29 MED ORDER — TINIDAZOLE 500 MG PO TABS: 1000.0000 mg | ORAL_TABLET | Freq: Every day | ORAL | 2 refills | Status: DC

## 2018-11-29 NOTE — Telephone Encounter (Signed)
Patient called and made aware that she had result of bacterial vaginosis. Patient made aware she has prescription to treat at her pharmacy. Kathrene Alu RN

## 2018-12-12 DIAGNOSIS — Z79891 Long term (current) use of opiate analgesic: Secondary | ICD-10-CM | POA: Diagnosis not present

## 2018-12-12 DIAGNOSIS — M79604 Pain in right leg: Secondary | ICD-10-CM | POA: Diagnosis not present

## 2018-12-12 DIAGNOSIS — M545 Low back pain: Secondary | ICD-10-CM | POA: Diagnosis not present

## 2018-12-12 DIAGNOSIS — G894 Chronic pain syndrome: Secondary | ICD-10-CM | POA: Diagnosis not present

## 2018-12-12 DIAGNOSIS — M25512 Pain in left shoulder: Secondary | ICD-10-CM | POA: Diagnosis not present

## 2018-12-18 ENCOUNTER — Ambulatory Visit: Payer: Medicare HMO

## 2018-12-18 ENCOUNTER — Other Ambulatory Visit: Payer: Medicare HMO

## 2018-12-18 ENCOUNTER — Ambulatory Visit: Payer: Medicare HMO | Admitting: Family Medicine

## 2018-12-18 ENCOUNTER — Telehealth: Payer: Self-pay | Admitting: *Deleted

## 2018-12-18 NOTE — Telephone Encounter (Signed)
Called patient to follow up to see if she was needing a virtual visit but she stated that she just took an Imitrex and some Tylenol.  She will call back to reschedule.

## 2018-12-18 NOTE — Telephone Encounter (Signed)
Copied from [facility] [id number]. Topic: Quick Communication - Appointment Cancellation >> Dec 18, 2018  8:21 AM Lennox Solders wrote: Patient called to cancel appointment scheduled for nurse visit and lab. Patient  HAS NOT rescheduled their appointment. Pt has low grade fever and migraine  Route to department's PEC pool.

## 2018-12-24 ENCOUNTER — Other Ambulatory Visit: Payer: Self-pay | Admitting: Obstetrics

## 2018-12-24 ENCOUNTER — Ambulatory Visit: Payer: Medicare HMO | Admitting: Obstetrics

## 2018-12-24 DIAGNOSIS — N882 Stricture and stenosis of cervix uteri: Secondary | ICD-10-CM

## 2018-12-24 MED ORDER — MISOPROSTOL 200 MCG PO TABS: ORAL_TABLET | ORAL | 0 refills | Status: DC

## 2019-01-09 DIAGNOSIS — G894 Chronic pain syndrome: Secondary | ICD-10-CM | POA: Diagnosis not present

## 2019-01-09 DIAGNOSIS — M545 Low back pain: Secondary | ICD-10-CM | POA: Diagnosis not present

## 2019-01-09 DIAGNOSIS — M25512 Pain in left shoulder: Secondary | ICD-10-CM | POA: Diagnosis not present

## 2019-01-09 DIAGNOSIS — M79604 Pain in right leg: Secondary | ICD-10-CM | POA: Diagnosis not present

## 2019-01-09 DIAGNOSIS — Z79891 Long term (current) use of opiate analgesic: Secondary | ICD-10-CM | POA: Diagnosis not present

## 2019-01-17 DIAGNOSIS — Z9889 Other specified postprocedural states: Secondary | ICD-10-CM | POA: Diagnosis not present

## 2019-01-17 DIAGNOSIS — Z981 Arthrodesis status: Secondary | ICD-10-CM | POA: Diagnosis not present

## 2019-01-17 DIAGNOSIS — M438X4 Other specified deforming dorsopathies, thoracic region: Secondary | ICD-10-CM | POA: Diagnosis not present

## 2019-01-17 DIAGNOSIS — M419 Scoliosis, unspecified: Secondary | ICD-10-CM | POA: Diagnosis not present

## 2019-01-17 DIAGNOSIS — M5134 Other intervertebral disc degeneration, thoracic region: Secondary | ICD-10-CM | POA: Diagnosis not present

## 2019-01-17 DIAGNOSIS — M50322 Other cervical disc degeneration at C5-C6 level: Secondary | ICD-10-CM | POA: Diagnosis not present

## 2019-01-17 DIAGNOSIS — S32039S Unspecified fracture of third lumbar vertebra, sequela: Secondary | ICD-10-CM | POA: Diagnosis not present

## 2019-01-17 DIAGNOSIS — Z4789 Encounter for other orthopedic aftercare: Secondary | ICD-10-CM | POA: Diagnosis not present

## 2019-01-23 ENCOUNTER — Ambulatory Visit: Payer: Medicare HMO | Admitting: Obstetrics and Gynecology

## 2019-02-06 DIAGNOSIS — M25512 Pain in left shoulder: Secondary | ICD-10-CM | POA: Diagnosis not present

## 2019-02-06 DIAGNOSIS — Z79891 Long term (current) use of opiate analgesic: Secondary | ICD-10-CM | POA: Diagnosis not present

## 2019-02-06 DIAGNOSIS — M545 Low back pain: Secondary | ICD-10-CM | POA: Diagnosis not present

## 2019-02-06 DIAGNOSIS — G894 Chronic pain syndrome: Secondary | ICD-10-CM | POA: Diagnosis not present

## 2019-02-06 DIAGNOSIS — M79604 Pain in right leg: Secondary | ICD-10-CM | POA: Diagnosis not present

## 2019-02-18 ENCOUNTER — Telehealth: Payer: Self-pay | Admitting: Family

## 2019-02-18 NOTE — Telephone Encounter (Signed)
Patient is calling to schedule appt today or tomorrow with Lenna Sciara for a Miagrine. Attempted to call the office 3 times. No answer. Please advise CB- [phone]

## 2019-02-19 ENCOUNTER — Encounter: Payer: Self-pay | Admitting: Family

## 2019-02-19 ENCOUNTER — Ambulatory Visit (INDEPENDENT_AMBULATORY_CARE_PROVIDER_SITE_OTHER): Payer: Medicare HMO | Admitting: Family

## 2019-02-19 ENCOUNTER — Ambulatory Visit: Payer: Medicare HMO | Admitting: Obstetrics

## 2019-02-19 ENCOUNTER — Other Ambulatory Visit: Payer: Self-pay

## 2019-02-19 VITALS — Temp 98.8°F | Ht 63.0 in | Wt 190.0 lb

## 2019-02-19 DIAGNOSIS — G43909 Migraine, unspecified, not intractable, without status migrainosus: Secondary | ICD-10-CM

## 2019-02-19 MED ORDER — VENTOLIN HFA 108 (90 BASE) MCG/ACT IN AERS: 2.0000 | INHALATION_SPRAY | Freq: Four times a day (QID) | RESPIRATORY_TRACT | 5 refills | Status: DC | PRN

## 2019-02-19 MED ORDER — ONDANSETRON HCL 4 MG PO TABS: 4.0000 mg | ORAL_TABLET | Freq: Three times a day (TID) | ORAL | 0 refills | Status: DC | PRN

## 2019-02-19 MED ORDER — AMITRIPTYLINE HCL 25 MG PO TABS: 25.0000 mg | ORAL_TABLET | Freq: Every day | ORAL | 2 refills | Status: DC

## 2019-02-19 NOTE — Progress Notes (Signed)
Virtual Visit via Video Note  I connected with [name] on 02/19/19 at  5:00 PM EST by a video enabled telemedicine application and verified that I am speaking with the correct person using two identifiers.  Location: Patient: home Provider: work   I discussed the limitations of evaluation and management by telemedicine and the availability of in person appointments. The patient expressed understanding and agreed to proceed.  History of Present Illness:   Patient is a 39 yr old female who presents today with chief complaint of eports that it felt like she had an "ice pick from her left ear to her left eye."  Head throbbing as well.  She tried imitrex, cold compresses. Had associated nausea/vomitting.  Yesterday she was able to start keeping down food and liquids. Today is day 4 and HA is much improved. Reports that she had blurred vision in the left eye during the migraine.   Past Medical History:  Diagnosis Date  . Anxiety    no meds  . Asthma    "very badly" (01/17/2012)  . Cervical cancer ([place]) 1999; 2000  . CHF (congestive heart failure) ([place]) 2006   Resolved -History"with 2006 pregnancy, toxcemia "  not seeing a cardiologist now.  . Chronic back pain   . Chronic bronchitis ([place])    "@ least once/yr" (01/17/2012) - none since 01/2012  . Complication of anesthesia    back surgeries x 6 - fusions T8-S2- rods  . Degenerative disk disease   . Difficult intubation    "needs small tube"  . Family history of anesthesia complication    Nausea  . GERD (gastroesophageal reflux disease)   . Migraines    last migraine 09/2013  . Neuromuscular disorder (HCC)    sciatica  - affecting left leg numb/pain  . Pneumonia    "~ once/yr" (01/17/2012) none since 01/2012  . PONV (postoperative nausea and vomiting)   . Respiratory failure ([place]) 11/2010; 09/2011   Resolved  - History pneumonia     Social History   Socioeconomic History  . Marital status: Married    Spouse name: Not  on file  . Number of children: 1  . Years of education: Not on file  . Highest education level: Not on file  Occupational History  . Occupation: Disabled  Tobacco Use  . Smoking status: Current Every Day Smoker    Years: 15.00    Types: Cigarettes    Last attempt to quit: 07/06/2017    Years since quitting: 1.6  . Smokeless tobacco: Never Used  . Tobacco comment: 1/4-1/2 PPD  Substance and Sexual Activity  . Alcohol use: No  . Drug use: No  . Sexual activity: Yes    Partners: Male    Birth control/protection: I.U.D.  Other Topics Concern  . Not on file  Social History Narrative   Married, 2 children   disabled   Social Determinants of Health   Financial Resource Strain:   . Difficulty of Paying Living Expenses: Not on file  Food Insecurity:   . Worried About Charity fundraiser in the Last Year: Not on file  . Ran Out of Food in the Last Year: Not on file  Transportation Needs:   . Lack of Transportation (Medical): Not on file  . Lack of Transportation (Non-Medical): Not on file  Physical Activity:   . Days of Exercise per Week: Not on file  . Minutes of Exercise per Session: Not on file  Stress:   .  Feeling of Stress : Not on file  Social Connections:   . Frequency of Communication with Friends and Family: Not on file  . Frequency of Social Gatherings with Friends and Family: Not on file  . Attends Religious Services: Not on file  . Active Member of Clubs or Organizations: Not on file  . Attends Archivist Meetings: Not on file  . Marital Status: Not on file  Intimate Partner Violence:   . Fear of Current or Ex-Partner: Not on file  . Emotionally Abused: Not on file  . Physically Abused: Not on file  . Sexually Abused: Not on file    Past Surgical History:  Procedure Laterality Date  . ADENOIDECTOMY, TONSILLECTOMY AND MYRINGOTOMY WITH TUBE PLACEMENT  1983?  . ANTERIOR LUMBAR FUSION  01/17/2012   Procedure: ANTERIOR LUMBAR FUSION 2 LEVELS;  Surgeon:  Lowella Grip, MD;  Location: [facility];  Service: Orthopedics;  Laterality: Bilateral;  Anterior Lumbar 3-4, Lumbar 4-5 fusion with bone graft harvest  . ANTERIOR LUMBAR FUSION    . BACK SURGERY  02/19/2014  . CERVICAL CONE BIOPSY  1999; 2000   cervical cancer  . CESAREAN SECTION N/A 11/08/2013   Procedure: REPEAT CESAREAN SECTION;  Surgeon: Shelly Bombard, MD;  Location: [facility];  Service: Obstetrics;  Laterality: N/A;  . KNEE ARTHROSCOPY  2004 X 2   "left X 2" (01/17/2012)  . LUMBAR DISC SURGERY  06/1998   "L4-5" (01/17/2012)  . LUMBAR FUSION  08/1998; 2010; 2012; 06/2011, 02/19/14   "fusion cage; put rods in; reanchored screws; rod broke" (01/17/2012); Had son in 10/2013 and rods in back broke.   Marland Kitchen SPINAL FUSION  10/04/2011   Procedure: FUSION POSTERIOR SPINAL MULTILEVEL;  Surgeon: Lowella Grip, MD;  Location: [facility];  Service: Orthopedics;  Laterality: Bilateral;  repair lumbar pseudoarthritis, revision segmental fixation, L3-L4, L4-L5  . SPINAL FUSION  07/06/2015   T7 - S2  . TONSILLECTOMY      Family History  Problem Relation Age of Onset  . COPD Mother   . Breast cancer Mother   . Heart disease Father 70  . Hypertension Father   . Diabetes Father        ESRD on dialysis  . Deep vein thrombosis Father   . Lung cancer Father   . Cancer Other   . Heart attack Sister 53       Died with MI  . Drug abuse Brother   . Depression Brother   . Hyperlipidemia Brother   . Hypertension Brother     Allergies  Allergen Reactions  . Iodine Itching and Rash    Pt states she has used iodine and has had no reaction, she is allergic to steri strips. 08-30-16  . Other Rash    Compound Tincture of Benzoin     Current Outpatient Medications on File Prior to Visit  Medication Sig Dispense Refill  . budesonide-formoterol (SYMBICORT) 80-4.5 MCG/ACT inhaler Inhale 2 puffs into the lungs 2 (two) times daily. 1 Inhaler 3  . carisoprodol (SOMA) 350 MG tablet     . Cholecalciferol (VITAMIN D  PO) Take 500 mg by mouth 2 (two) times daily.    Marland Kitchen gabapentin (NEURONTIN) 300 MG capsule     . loratadine (CLARITIN) 10 MG tablet TAKE 1 TABLET BY MOUTH ONCE DAILY 30 tablet 10  . misoprostol (CYTOTEC) 200 MCG tablet 400 mcg ( 2 tablets ) vaginally 3-4 hours prior to scheduled procedure. 2 tablet 0  . omeprazole (  PRILOSEC) 40 MG capsule Take 1 capsule (40 mg total) by mouth daily. 30 capsule 5  . oxyCODONE (ROXICODONE) 15 MG immediate release tablet Take 15 mg by mouth every 4 (four) hours as needed for pain.    . predniSONE (DELTASONE) 5 MG tablet Take 6 pills for first day, 5 pills second day, 4 pills third day, 3 pills fourth day, 2 pills the fifth day, and 1 pill sixth day. 21 tablet 0  . SUMAtriptan (IMITREX) 50 MG tablet Take 1 tablet (50 mg total) by mouth every 2 (two) hours as needed for migraine. May repeat in 2 hours if headache persists or recurs. 10 tablet 5  . tinidazole (TINDAMAX) 500 MG tablet Take 2 tablets (1,000 mg total) by mouth daily with breakfast. 10 tablet 2   No current facility-administered medications on file prior to visit.    Temp 98.8 F (37.1 C) (Oral)   Ht 5\' 3"  (1.6 m)   Wt 190 lb (86.2 kg)   BMI 33.66 kg/m      Observations/Objective:   Gen: Awake, alert, no acute distress Resp: Breathing is even and non-labored Psych: calm/pleasant demeanor Neuro: Alert and Oriented x 3, + facial symmetry, speech is clear.   Assessment and Plan:  Migraines- uncontrolled.  Will add trial of nightly elavil.  Continue prn imitrex.  Follow up in 1 month.   Follow Up Instructions:    I discussed the assessment and treatment plan with the patient. The patient was provided an opportunity to ask questions and all were answered. The patient agreed with the plan and demonstrated an understanding of the instructions.   The patient was advised to call back or seek an in-person evaluation if the symptoms worsen or if the condition fails to improve as  anticipated.  Nance Pear, NP

## 2019-02-25 ENCOUNTER — Ambulatory Visit: Payer: Medicare HMO | Admitting: Obstetrics

## 2019-03-07 DIAGNOSIS — G894 Chronic pain syndrome: Secondary | ICD-10-CM | POA: Diagnosis not present

## 2019-03-07 DIAGNOSIS — M79604 Pain in right leg: Secondary | ICD-10-CM | POA: Diagnosis not present

## 2019-03-07 DIAGNOSIS — Z79891 Long term (current) use of opiate analgesic: Secondary | ICD-10-CM | POA: Diagnosis not present

## 2019-03-07 DIAGNOSIS — M545 Low back pain: Secondary | ICD-10-CM | POA: Diagnosis not present

## 2019-03-07 DIAGNOSIS — M25512 Pain in left shoulder: Secondary | ICD-10-CM | POA: Diagnosis not present

## 2019-04-01 ENCOUNTER — Encounter: Payer: Self-pay | Admitting: Obstetrics

## 2019-04-01 ENCOUNTER — Ambulatory Visit (INDEPENDENT_AMBULATORY_CARE_PROVIDER_SITE_OTHER): Payer: Medicare HMO | Admitting: Obstetrics

## 2019-04-01 ENCOUNTER — Other Ambulatory Visit: Payer: Self-pay

## 2019-04-01 VITALS — BP 129/90 | HR 80 | Wt 189.0 lb

## 2019-04-01 DIAGNOSIS — T8332XD Displacement of intrauterine contraceptive device, subsequent encounter: Secondary | ICD-10-CM | POA: Diagnosis not present

## 2019-04-01 DIAGNOSIS — Z30433 Encounter for removal and reinsertion of intrauterine contraceptive device: Secondary | ICD-10-CM

## 2019-04-01 DIAGNOSIS — N882 Stricture and stenosis of cervix uteri: Secondary | ICD-10-CM | POA: Diagnosis not present

## 2019-04-01 MED ORDER — LEVONORGESTREL 20 MCG/24HR IU IUD: INTRAUTERINE_SYSTEM | Freq: Once | INTRAUTERINE | Status: DC

## 2019-04-01 NOTE — Progress Notes (Addendum)
Patient ID: [name], female   DOB: 07/27/1980, 39 y.o.   MRN: [id number]  Chief Complaint  Patient presents with  . Contraception    IUD removal/insert    HPI [name] is a 39 y.o. female.  Presents for IUD removal / reinsertion.  She has a history of cervical stenosis after 2 Bold Knife Conizations of the cervix.  She was given Cytotec 800 mg to be placed in the vagina before leaving home for her appointment today. HPI  Past Medical History:  Diagnosis Date  . Anxiety    no meds  . Asthma    "very badly" (01/17/2012)  . Cervical cancer ([place]) 1999; 2000  . CHF (congestive heart failure) ([place]) 2006   Resolved -History"with 2006 pregnancy, toxcemia "  not seeing a cardiologist now.  . Chronic back pain   . Chronic bronchitis ([place])    "@ least once/yr" (01/17/2012) - none since 01/2012  . Complication of anesthesia    back surgeries x 6 - fusions T8-S2- rods  . Degenerative disk disease   . Difficult intubation    "needs small tube"  . Family history of anesthesia complication    Nausea  . GERD (gastroesophageal reflux disease)   . Migraines    last migraine 09/2013  . Neuromuscular disorder (HCC)    sciatica  - affecting left leg numb/pain  . Pneumonia    "~ once/yr" (01/17/2012) none since 01/2012  . PONV (postoperative nausea and vomiting)   . Respiratory failure ([place]) 11/2010; 09/2011   Resolved  - History pneumonia    Past Surgical History:  Procedure Laterality Date  . ADENOIDECTOMY, TONSILLECTOMY AND MYRINGOTOMY WITH TUBE PLACEMENT  1983?  . ANTERIOR LUMBAR FUSION  01/17/2012   Procedure: ANTERIOR LUMBAR FUSION 2 LEVELS;  Surgeon: Lowella Grip, MD;  Location: [facility];  Service: Orthopedics;  Laterality: Bilateral;  Anterior Lumbar 3-4, Lumbar 4-5 fusion with bone graft harvest  . ANTERIOR LUMBAR FUSION    . BACK SURGERY  02/19/2014  . CERVICAL CONE BIOPSY  1999; 2000   cervical cancer  . CESAREAN SECTION N/A 11/08/2013   Procedure: REPEAT  CESAREAN SECTION;  Surgeon: Shelly Bombard, MD;  Location: [facility];  Service: Obstetrics;  Laterality: N/A;  . KNEE ARTHROSCOPY  2004 X 2   "left X 2" (01/17/2012)  . LUMBAR DISC SURGERY  06/1998   "L4-5" (01/17/2012)  . LUMBAR FUSION  08/1998; 2010; 2012; 06/2011, 02/19/14   "fusion cage; put rods in; reanchored screws; rod broke" (01/17/2012); Had son in 10/2013 and rods in back broke.   Marland Kitchen SPINAL FUSION  10/04/2011   Procedure: FUSION POSTERIOR SPINAL MULTILEVEL;  Surgeon: Lowella Grip, MD;  Location: [facility];  Service: Orthopedics;  Laterality: Bilateral;  repair lumbar pseudoarthritis, revision segmental fixation, L3-L4, L4-L5  . SPINAL FUSION  07/06/2015   T7 - S2  . TONSILLECTOMY      Family History  Problem Relation Age of Onset  . COPD Mother   . Breast cancer Mother   . Heart disease Father 23  . Hypertension Father   . Diabetes Father        ESRD on dialysis  . Deep vein thrombosis Father   . Lung cancer Father   . Cancer Other   . Heart attack Sister 34       Died with MI  . Drug abuse Brother   . Depression Brother   . Hyperlipidemia Brother   . Hypertension Brother  Social History Social History   Tobacco Use  . Smoking status: Current Every Day Smoker    Years: 15.00    Types: Cigarettes    Last attempt to quit: 07/06/2017    Years since quitting: 1.7  . Smokeless tobacco: Never Used  . Tobacco comment: 1/4-1/2 PPD  Substance Use Topics  . Alcohol use: No  . Drug use: No    Allergies  Allergen Reactions  . Iodine Itching and Rash    Pt states she has used iodine and has had no reaction, she is allergic to steri strips. 08-30-16  . Other Rash    Compound Tincture of Benzoin     Current Outpatient Medications  Medication Sig Dispense Refill  . amitriptyline (ELAVIL) 25 MG tablet Take 1 tablet (25 mg total) by mouth at bedtime. 30 tablet 2  . budesonide-formoterol (SYMBICORT) 80-4.5 MCG/ACT inhaler Inhale 2 puffs into the lungs 2 (two) times daily.  1 Inhaler 3  . carisoprodol (SOMA) 350 MG tablet     . Cholecalciferol (VITAMIN D PO) Take 500 mg by mouth 2 (two) times daily.    Marland Kitchen gabapentin (NEURONTIN) 300 MG capsule     . loratadine (CLARITIN) 10 MG tablet TAKE 1 TABLET BY MOUTH ONCE DAILY 30 tablet 10  . misoprostol (CYTOTEC) 200 MCG tablet 400 mcg ( 2 tablets ) vaginally 3-4 hours prior to scheduled procedure. 2 tablet 0  . omeprazole (PRILOSEC) 40 MG capsule Take 1 capsule (40 mg total) by mouth daily. 30 capsule 5  . ondansetron (ZOFRAN) 4 MG tablet Take 1 tablet (4 mg total) by mouth every 8 (eight) hours as needed for nausea or vomiting. 20 tablet 0  . oxyCODONE (ROXICODONE) 15 MG immediate release tablet Take 15 mg by mouth every 4 (four) hours as needed for pain.    . predniSONE (DELTASONE) 5 MG tablet Take 6 pills for first day, 5 pills second day, 4 pills third day, 3 pills fourth day, 2 pills the fifth day, and 1 pill sixth day. 21 tablet 0  . SUMAtriptan (IMITREX) 50 MG tablet Take 1 tablet (50 mg total) by mouth every 2 (two) hours as needed for migraine. May repeat in 2 hours if headache persists or recurs. 10 tablet 5  . tinidazole (TINDAMAX) 500 MG tablet Take 2 tablets (1,000 mg total) by mouth daily with breakfast. 10 tablet 2  . VENTOLIN HFA 108 (90 Base) MCG/ACT inhaler Inhale 2 puffs into the lungs every 6 (six) hours as needed for wheezing or shortness of breath. 6.7 g 5   No current facility-administered medications for this visit.    Review of Systems Review of Systems Constitutional: negative for fatigue and weight loss Respiratory: negative for cough and wheezing Cardiovascular: negative for chest pain, fatigue and palpitations Gastrointestinal: negative for abdominal pain and change in bowel habits Genitourinary: negative Integument/breast: negative for nipple discharge Musculoskeletal:negative for myalgias Neurological: negative  Behavioral/Psych: negative for abusive relationship, depression Endocrine:  negative for temperature intolerance      Blood pressure 129/90, pulse 80, weight 189 lb (85.7 kg).  Physical Exam Physical Exam General:   alert  Skin:   no rash or abnormalities  Lungs:   clear to auscultation bilaterally  Heart:   regular rate and rhythm, S1, S2 normal, no murmur, click, rub or gallop  Breasts:   normal without suspicious masses, skin or nipple changes or axillary nodes  Abdomen:  normal findings: no organomegaly, soft, non-tender and no hernia  Pelvis:  External  genitalia: normal general appearance Urinary system: urethral meatus normal and bladder without fullness, nontender Vaginal: normal without tenderness, induration or masses Cervix: normal appearance.  IUD string not visible Adnexa: normal bimanual exam Uterus: anteverted and non-tender, normal size    50% of 15 min visit spent on counseling and coordination of care.   Data Reviewed Office notes from last visit with no IUD string visible  Assessment      1. Encounter for IUD removal and reinsertion - IUD strings not visible, and could not grasp strings due to cervical stenosis  2. Intrauterine contraceptive device threads lost, subsequent encounter Rx: - US PELVIC COMPLETE WITH TRANSVAGINAL; Future  3. Cervical stenosis (uterine cervix) - may need outpatient dilation of the cervix under anesthesia and removal / reinsertion of IUD  Plan   Follow up in 2 weeks  Orders Placed This Encounter  Procedures  . US PELVIC COMPLETE WITH TRANSVAGINAL    Standing Status:   Future    Standing Expiration Date:   05/29/2020    Order Specific Question:   Reason for Exam (SYMPTOM  OR DIAGNOSIS REQUIRED)    Answer:   IUD strings lost.    Order Specific Question:   Preferred imaging location?    Answer:   [facility]   Meds ordered this encounter  Medications  . DISCONTD: levonorgestrel (MIRENA) 20 MCG/24HR IUD       Shelly Bombard, MD 04/01/2019 4:25 PM

## 2019-04-05 DIAGNOSIS — M545 Low back pain: Secondary | ICD-10-CM | POA: Diagnosis not present

## 2019-04-05 DIAGNOSIS — M25512 Pain in left shoulder: Secondary | ICD-10-CM | POA: Diagnosis not present

## 2019-04-05 DIAGNOSIS — G894 Chronic pain syndrome: Secondary | ICD-10-CM | POA: Diagnosis not present

## 2019-04-05 DIAGNOSIS — Z79891 Long term (current) use of opiate analgesic: Secondary | ICD-10-CM | POA: Diagnosis not present

## 2019-04-05 DIAGNOSIS — M79604 Pain in right leg: Secondary | ICD-10-CM | POA: Diagnosis not present

## 2019-04-09 ENCOUNTER — Ambulatory Visit (HOSPITAL_COMMUNITY): Payer: Medicare HMO

## 2019-04-17 ENCOUNTER — Ambulatory Visit (HOSPITAL_COMMUNITY): Payer: Medicare HMO

## 2019-04-19 ENCOUNTER — Telehealth: Payer: Self-pay

## 2019-04-19 NOTE — Telephone Encounter (Signed)
Called patient about a message she left with symptoms of cough, fever, diarrhea, nausea, and fatigue since Tuesday. Patient reports she is feeling a little better and diarrhea is better. She also said he is able to eat some.  She was offered a virtual visit but she refused, patient reports she is scheduled for covid test on Monday and "she is just going to stay at home and wait to have the test".  Patient advised if symptoms get worst she needs to seek medical attention at er. She verbalized understanding.

## 2019-04-24 ENCOUNTER — Ambulatory Visit: Payer: Medicare HMO | Attending: Internal Medicine

## 2019-04-24 DIAGNOSIS — Z20822 Contact with and (suspected) exposure to covid-19: Secondary | ICD-10-CM | POA: Diagnosis not present

## 2019-04-25 LAB — NOVEL CORONAVIRUS, NAA: SARS-CoV-2, NAA: NOT DETECTED

## 2019-05-03 DIAGNOSIS — Z79891 Long term (current) use of opiate analgesic: Secondary | ICD-10-CM | POA: Diagnosis not present

## 2019-05-03 DIAGNOSIS — M25512 Pain in left shoulder: Secondary | ICD-10-CM | POA: Diagnosis not present

## 2019-05-03 DIAGNOSIS — M79604 Pain in right leg: Secondary | ICD-10-CM | POA: Diagnosis not present

## 2019-05-03 DIAGNOSIS — G894 Chronic pain syndrome: Secondary | ICD-10-CM | POA: Diagnosis not present

## 2019-05-03 DIAGNOSIS — M545 Low back pain: Secondary | ICD-10-CM | POA: Diagnosis not present

## 2019-05-07 ENCOUNTER — Ambulatory Visit: Payer: Medicare HMO | Admitting: Family

## 2019-06-03 DIAGNOSIS — M545 Low back pain: Secondary | ICD-10-CM | POA: Diagnosis not present

## 2019-06-03 DIAGNOSIS — G894 Chronic pain syndrome: Secondary | ICD-10-CM | POA: Diagnosis not present

## 2019-06-03 DIAGNOSIS — Z79891 Long term (current) use of opiate analgesic: Secondary | ICD-10-CM | POA: Diagnosis not present

## 2019-06-03 DIAGNOSIS — M79604 Pain in right leg: Secondary | ICD-10-CM | POA: Diagnosis not present

## 2019-06-03 DIAGNOSIS — M25512 Pain in left shoulder: Secondary | ICD-10-CM | POA: Diagnosis not present

## 2019-06-17 DIAGNOSIS — F1190 Opioid use, unspecified, uncomplicated: Secondary | ICD-10-CM | POA: Diagnosis not present

## 2019-06-17 DIAGNOSIS — M961 Postlaminectomy syndrome, not elsewhere classified: Secondary | ICD-10-CM | POA: Diagnosis not present

## 2019-06-17 DIAGNOSIS — M792 Neuralgia and neuritis, unspecified: Secondary | ICD-10-CM | POA: Diagnosis not present

## 2019-06-17 DIAGNOSIS — G894 Chronic pain syndrome: Secondary | ICD-10-CM | POA: Diagnosis not present

## 2019-07-01 DIAGNOSIS — M79604 Pain in right leg: Secondary | ICD-10-CM | POA: Diagnosis not present

## 2019-07-01 DIAGNOSIS — G894 Chronic pain syndrome: Secondary | ICD-10-CM | POA: Diagnosis not present

## 2019-07-01 DIAGNOSIS — M25512 Pain in left shoulder: Secondary | ICD-10-CM | POA: Diagnosis not present

## 2019-07-01 DIAGNOSIS — M545 Low back pain: Secondary | ICD-10-CM | POA: Diagnosis not present

## 2019-07-01 DIAGNOSIS — Z79891 Long term (current) use of opiate analgesic: Secondary | ICD-10-CM | POA: Diagnosis not present

## 2019-08-09 DIAGNOSIS — M961 Postlaminectomy syndrome, not elsewhere classified: Secondary | ICD-10-CM | POA: Diagnosis not present

## 2019-08-09 DIAGNOSIS — M792 Neuralgia and neuritis, unspecified: Secondary | ICD-10-CM | POA: Diagnosis not present

## 2019-08-09 DIAGNOSIS — G894 Chronic pain syndrome: Secondary | ICD-10-CM | POA: Diagnosis not present

## 2019-08-09 DIAGNOSIS — F1190 Opioid use, unspecified, uncomplicated: Secondary | ICD-10-CM | POA: Diagnosis not present

## 2019-08-14 DIAGNOSIS — M792 Neuralgia and neuritis, unspecified: Secondary | ICD-10-CM | POA: Diagnosis not present

## 2019-08-14 DIAGNOSIS — M961 Postlaminectomy syndrome, not elsewhere classified: Secondary | ICD-10-CM | POA: Diagnosis not present

## 2019-08-14 DIAGNOSIS — F1190 Opioid use, unspecified, uncomplicated: Secondary | ICD-10-CM | POA: Diagnosis not present

## 2019-08-14 DIAGNOSIS — F54 Psychological and behavioral factors associated with disorders or diseases classified elsewhere: Secondary | ICD-10-CM | POA: Diagnosis not present

## 2019-08-14 DIAGNOSIS — G894 Chronic pain syndrome: Secondary | ICD-10-CM | POA: Diagnosis not present

## 2019-08-21 ENCOUNTER — Ambulatory Visit: Payer: Medicare HMO | Admitting: Physician Assistant

## 2019-08-26 DIAGNOSIS — M79604 Pain in right leg: Secondary | ICD-10-CM | POA: Diagnosis not present

## 2019-08-26 DIAGNOSIS — M545 Low back pain: Secondary | ICD-10-CM | POA: Diagnosis not present

## 2019-08-26 DIAGNOSIS — E559 Vitamin D deficiency, unspecified: Secondary | ICD-10-CM | POA: Diagnosis not present

## 2019-08-26 DIAGNOSIS — M79605 Pain in left leg: Secondary | ICD-10-CM | POA: Diagnosis not present

## 2019-08-26 DIAGNOSIS — Z79899 Other long term (current) drug therapy: Secondary | ICD-10-CM | POA: Diagnosis not present

## 2019-08-26 DIAGNOSIS — M546 Pain in thoracic spine: Secondary | ICD-10-CM | POA: Diagnosis not present

## 2019-08-26 DIAGNOSIS — Z1152 Encounter for screening for COVID-19: Secondary | ICD-10-CM | POA: Diagnosis not present

## 2019-08-26 DIAGNOSIS — M542 Cervicalgia: Secondary | ICD-10-CM | POA: Diagnosis not present

## 2019-08-26 DIAGNOSIS — M129 Arthropathy, unspecified: Secondary | ICD-10-CM | POA: Diagnosis not present

## 2019-09-24 ENCOUNTER — Ambulatory Visit (INDEPENDENT_AMBULATORY_CARE_PROVIDER_SITE_OTHER): Payer: Medicare HMO | Admitting: Family Medicine

## 2019-09-24 ENCOUNTER — Other Ambulatory Visit: Payer: Self-pay

## 2019-09-24 ENCOUNTER — Ambulatory Visit: Payer: Self-pay

## 2019-09-24 ENCOUNTER — Encounter: Payer: Self-pay | Admitting: Family Medicine

## 2019-09-24 VITALS — BP 127/84 | HR 98 | Ht 63.0 in | Wt 185.0 lb

## 2019-09-24 DIAGNOSIS — M1712 Unilateral primary osteoarthritis, left knee: Secondary | ICD-10-CM

## 2019-09-24 DIAGNOSIS — M2170 Unequal limb length (acquired), unspecified site: Secondary | ICD-10-CM | POA: Diagnosis not present

## 2019-09-24 MED ORDER — TRIAMCINOLONE ACETONIDE 40 MG/ML IJ SUSP
40.0000 mg | Freq: Once | INTRAMUSCULAR | Status: AC
  Administered 2019-09-24: 40 mg via INTRA_ARTICULAR

## 2019-09-24 MED ORDER — AMBULATORY NON FORMULARY MEDICATION: 0 refills | Status: DC

## 2019-09-24 NOTE — Progress Notes (Signed)
WINNELL BENTO - 39 y.o. female MRN [id number]  Date of birth: [birth year]  SUBJECTIVE:  Including CC & ROS.  Chief Complaint  Patient presents with  . Knee Pain    LEFT X 1 MONTH    Bernie J Swalley is a 39 y.o. female that is presenting with acute on chronic left knee pain. She denies any specific inciting event. Is progressively gotten worse over the past month. She is having pain over the lateral aspect and with wrist extension. She also has pain climbing up ladders. No improvement with modalities today. Reports a remote history of arthroscopic surgery from 2004..  Independent review of the MRI of the left knee from 2016 shows full-thickness cartilage loss of the lateral femoral condyle.   Review of Systems See HPI   HISTORY: Past Medical, Surgical, Social, and Family History Reviewed & Updated per EMR.   Pertinent Historical Findings include:  Past Medical History:  Diagnosis Date  . Anxiety    no meds  . Asthma    "very badly" (01/17/2012)  . Cervical cancer ([place]) 1999; 2000  . CHF (congestive heart failure) ([name]) 2006   Resolved -History"with 2006 pregnancy, toxcemia "  not seeing a cardiologist now.  . Chronic back pain   . Chronic bronchitis ([place])    "@ least once/yr" (01/17/2012) - none since 01/2012  . Complication of anesthesia    back surgeries x 6 - fusions T8-S2- rods  . Degenerative disk disease   . Difficult intubation    "needs small tube"  . Family history of anesthesia complication    Nausea  . GERD (gastroesophageal reflux disease)   . Migraines    last migraine 09/2013  . Neuromuscular disorder (HCC)    sciatica  - affecting left leg numb/pain  . Pneumonia    "~ once/yr" (01/17/2012) none since 01/2012  . PONV (postoperative nausea and vomiting)   . Respiratory failure ([place]) 11/2010; 09/2011   Resolved  - History pneumonia    Past Surgical History:  Procedure Laterality Date  . ADENOIDECTOMY, TONSILLECTOMY AND MYRINGOTOMY WITH TUBE  PLACEMENT  1983?  . ANTERIOR LUMBAR FUSION  01/17/2012   Procedure: ANTERIOR LUMBAR FUSION 2 LEVELS;  Surgeon: Lowella Grip, MD;  Location: [facility];  Service: Orthopedics;  Laterality: Bilateral;  Anterior Lumbar 3-4, Lumbar 4-5 fusion with bone graft harvest  . ANTERIOR LUMBAR FUSION    . BACK SURGERY  02/19/2014  . CERVICAL CONE BIOPSY  1999; 2000   cervical cancer  . CESAREAN SECTION N/A 11/08/2013   Procedure: REPEAT CESAREAN SECTION;  Surgeon: Shelly Bombard, MD;  Location: [facility];  Service: Obstetrics;  Laterality: N/A;  . KNEE ARTHROSCOPY  2004 X 2   "left X 2" (01/17/2012)  . LUMBAR DISC SURGERY  06/1998   "L4-5" (01/17/2012)  . LUMBAR FUSION  08/1998; 2010; 2012; 06/2011, 02/19/14   "fusion cage; put rods in; reanchored screws; rod broke" (01/17/2012); Had son in 10/2013 and rods in back broke.   Marland Kitchen SPINAL FUSION  10/04/2011   Procedure: FUSION POSTERIOR SPINAL MULTILEVEL;  Surgeon: Lowella Grip, MD;  Location: [facility];  Service: Orthopedics;  Laterality: Bilateral;  repair lumbar pseudoarthritis, revision segmental fixation, L3-L4, L4-L5  . SPINAL FUSION  07/06/2015   T7 - S2  . TONSILLECTOMY      Family History  Problem Relation Age of Onset  . COPD Mother   . Breast cancer Mother   . Heart disease Father 40  . Hypertension Father   .  Diabetes Father        ESRD on dialysis  . Deep vein thrombosis Father   . Lung cancer Father   . Cancer Other   . Heart attack Sister 58       Died with MI  . Drug abuse Brother   . Depression Brother   . Hyperlipidemia Brother   . Hypertension Brother     Social History   Socioeconomic History  . Marital status: Married    Spouse name: Not on file  . Number of children: 1  . Years of education: Not on file  . Highest education level: Not on file  Occupational History  . Occupation: Disabled  Tobacco Use  . Smoking status: Current Every Day Smoker    Years: 15.00    Types: Cigarettes    Last attempt to quit: 07/06/2017     Years since quitting: 2.2  . Smokeless tobacco: Never Used  . Tobacco comment: 1/4-1/2 PPD  Vaping Use  . Vaping Use: Never used  Substance and Sexual Activity  . Alcohol use: No  . Drug use: No  . Sexual activity: Yes    Partners: Male    Birth control/protection: I.U.D.  Other Topics Concern  . Not on file  Social History Narrative   Married, 2 children   disabled   Social Determinants of Health   Financial Resource Strain:   . Difficulty of Paying Living Expenses:   Food Insecurity:   . Worried About Charity fundraiser in the Last Year:   . Arboriculturist in the Last Year:   Transportation Needs:   . Film/video editor (Medical):   Marland Kitchen Lack of Transportation (Non-Medical):   Physical Activity:   . Days of Exercise per Week:   . Minutes of Exercise per Session:   Stress:   . Feeling of Stress :   Social Connections:   . Frequency of Communication with Friends and Family:   . Frequency of Social Gatherings with Friends and Family:   . Attends Religious Services:   . Active Member of Clubs or Organizations:   . Attends Archivist Meetings:   Marland Kitchen Marital Status:   Intimate Partner Violence:   . Fear of Current or Ex-Partner:   . Emotionally Abused:   Marland Kitchen Physically Abused:   . Sexually Abused:      PHYSICAL EXAM:  VS: BP 127/84   Pulse 98   Ht 5\' 3"  (1.6 m)   Wt 185 lb (83.9 kg)   BMI 32.77 kg/m  Physical Exam Gen: NAD, alert, cooperative with exam, well-appearing MSK:  Left knee: Obvious effusion. Tenderness to palpation over the lateral joint space and lateral femoral condyle. Normal range of motion. No instability. Valgus deformity. Neurovascularly intact  Limited ultrasound: Left knee:  Moderate effusion within the suprapatellar pouch. Normal-appearing quadricep and patellar tendon. Minor degenerative changes of the meniscus and medial joint space. Well-maintained lateral joint space and minor degenerative changes of the lateral  meniscus. Increased hyperemia over the lateral femoral condyle to suspect chondral irritation or insufficiency fracture.  Summary: Findings suggestive of bone changes with either chondral irritation versus insufficiency fracture  Ultrasound and interpretation by Clearance Coots, MD   Aspiration/Injection Procedure Note Makinzy Cleere Zeiner Feb 07, 1981  Procedure: Injection Indications: Left knee pain  Procedure Details Consent: Risks of procedure as well as the alternatives and risks of each were explained to the (patient/caregiver).  Consent for procedure obtained. Time Out: Verified patient identification, verified procedure,  site/side was marked, verified correct patient position, special equipment/implants available, medications/allergies/relevent history reviewed, required imaging and test results available.  Performed.  The area was cleaned with iodine and alcohol swabs.    The left knee superior lateral suprapatellar pouch was injected using 1 cc's of 40 mg Kenalog and 4 cc's of 0.25% bupivacaine with a 22 1 1/2" needle.  Ultrasound was used. Images were obtained in long views showing the injection.     A sterile dressing was applied.  Patient did tolerate procedure well.    ASSESSMENT & PLAN:   Primary osteoarthritis of left knee She has chondral thinning from previous MRI. It appears to be irritated versus an insufficiency fracture. -Counseled on supportive care. -Counseled on nonweightbearing. -We'll get vitamin D obtained. -Written prescription for rolling knee scooter. -Injection. -May need to consider bone density.  Acquired leg length discrepancy Left leg is shorter than right. We can provide heel lifts going forward.

## 2019-09-24 NOTE — Assessment & Plan Note (Signed)
She has chondral thinning from previous MRI. It appears to be irritated versus an insufficiency fracture. -Counseled on supportive care. -Counseled on nonweightbearing. -We'll get vitamin D obtained. -Written prescription for rolling knee scooter. -Injection. -May need to consider bone density.

## 2019-09-24 NOTE — Patient Instructions (Signed)
Good to see you Please try ice  Please try to take the weight off of the leg Please try to have the vitamin D lab drawn at [facility]   Please send me a message in [place] with any questions or updates.  Please see me back in 4 weeks.   --Dr. Raeford Razor

## 2019-09-24 NOTE — Assessment & Plan Note (Signed)
Left leg is shorter than right. We can provide heel lifts going forward.

## 2019-10-03 ENCOUNTER — Telehealth: Payer: Self-pay | Admitting: Family

## 2019-10-03 DIAGNOSIS — M1712 Unilateral primary osteoarthritis, left knee: Secondary | ICD-10-CM | POA: Diagnosis not present

## 2019-10-03 NOTE — Telephone Encounter (Signed)
Pt came in office stating Provider had ask for pt to have labs done (no orders on pt's chart) Pt wanted to have a print out of her labs to get her labs done at another lab facility. Please advise.

## 2019-10-03 NOTE — Telephone Encounter (Signed)
It looks like she never completed the labs ordered at her [facility] 9/20.  Please update those orders.

## 2019-10-04 DIAGNOSIS — M79605 Pain in left leg: Secondary | ICD-10-CM | POA: Diagnosis not present

## 2019-10-04 DIAGNOSIS — Z20822 Contact with and (suspected) exposure to covid-19: Secondary | ICD-10-CM | POA: Diagnosis not present

## 2019-10-04 DIAGNOSIS — Z1159 Encounter for screening for other viral diseases: Secondary | ICD-10-CM | POA: Diagnosis not present

## 2019-10-04 DIAGNOSIS — M545 Low back pain: Secondary | ICD-10-CM | POA: Diagnosis not present

## 2019-10-04 DIAGNOSIS — M79604 Pain in right leg: Secondary | ICD-10-CM | POA: Diagnosis not present

## 2019-10-04 DIAGNOSIS — Z79899 Other long term (current) drug therapy: Secondary | ICD-10-CM | POA: Diagnosis not present

## 2019-10-04 DIAGNOSIS — M129 Arthropathy, unspecified: Secondary | ICD-10-CM | POA: Diagnosis not present

## 2019-10-04 DIAGNOSIS — M542 Cervicalgia: Secondary | ICD-10-CM | POA: Diagnosis not present

## 2019-10-04 DIAGNOSIS — M546 Pain in thoracic spine: Secondary | ICD-10-CM | POA: Diagnosis not present

## 2019-10-04 DIAGNOSIS — E559 Vitamin D deficiency, unspecified: Secondary | ICD-10-CM | POA: Diagnosis not present

## 2019-10-04 NOTE — Telephone Encounter (Signed)
Called patient to ask where she wanted to go for her labs. She moved and lives about an hour away from this office.  She has a np appointment at an office in [facility] in October.  She will just wait until that appointment and follow up with them.  Wanted Nickol to know.

## 2019-10-28 ENCOUNTER — Other Ambulatory Visit: Payer: Medicare HMO

## 2019-10-29 ENCOUNTER — Telehealth: Payer: Self-pay | Admitting: Family Medicine

## 2019-10-29 MED ORDER — PREDNISONE 5 MG PO TABS: ORAL_TABLET | ORAL | 0 refills | Status: DC

## 2019-10-29 NOTE — Telephone Encounter (Signed)
Patient called to see if Dr. Raeford Razor will call her in an ( steroid Rx) for the knee is still swelling daily & is still painful-- Ask pt if she needs appt for provider to examine knee she decline & request only Rx be called into her pharmacy.  [place], [place] Phone:  [phone]  Fax:  [phone]     ---Forwarding patient request to provider for review.  --glh

## 2019-10-29 NOTE — Telephone Encounter (Signed)
Provided prednisone.   Rosemarie Ax, MD [facility] 10/29/2019, 2:12 PM

## 2019-10-31 DIAGNOSIS — M545 Low back pain: Secondary | ICD-10-CM | POA: Diagnosis not present

## 2019-10-31 DIAGNOSIS — Z79899 Other long term (current) drug therapy: Secondary | ICD-10-CM | POA: Diagnosis not present

## 2019-10-31 DIAGNOSIS — M62838 Other muscle spasm: Secondary | ICD-10-CM | POA: Diagnosis not present

## 2019-11-22 ENCOUNTER — Ambulatory Visit: Payer: Self-pay

## 2019-11-22 DIAGNOSIS — Z03818 Encounter for observation for suspected exposure to other biological agents ruled out: Secondary | ICD-10-CM | POA: Diagnosis not present

## 2019-11-27 ENCOUNTER — Ambulatory Visit: Payer: Medicare HMO | Admitting: Physician Assistant

## 2019-11-27 ENCOUNTER — Other Ambulatory Visit: Payer: Self-pay

## 2019-11-27 ENCOUNTER — Ambulatory Visit (INDEPENDENT_AMBULATORY_CARE_PROVIDER_SITE_OTHER): Payer: Medicare HMO | Admitting: Physician Assistant

## 2019-11-27 ENCOUNTER — Encounter: Payer: Self-pay | Admitting: Physician Assistant

## 2019-11-27 VITALS — BP 96/63 | HR 90 | Ht 63.0 in | Wt 181.9 lb

## 2019-11-27 DIAGNOSIS — G8929 Other chronic pain: Secondary | ICD-10-CM

## 2019-11-27 DIAGNOSIS — M25562 Pain in left knee: Secondary | ICD-10-CM

## 2019-11-27 DIAGNOSIS — G43809 Other migraine, not intractable, without status migrainosus: Secondary | ICD-10-CM | POA: Diagnosis not present

## 2019-11-27 DIAGNOSIS — J4540 Moderate persistent asthma, uncomplicated: Secondary | ICD-10-CM | POA: Diagnosis not present

## 2019-11-27 DIAGNOSIS — I509 Heart failure, unspecified: Secondary | ICD-10-CM | POA: Diagnosis not present

## 2019-11-27 DIAGNOSIS — Z981 Arthrodesis status: Secondary | ICD-10-CM

## 2019-11-27 DIAGNOSIS — J302 Other seasonal allergic rhinitis: Secondary | ICD-10-CM | POA: Diagnosis not present

## 2019-11-27 DIAGNOSIS — E559 Vitamin D deficiency, unspecified: Secondary | ICD-10-CM

## 2019-11-27 DIAGNOSIS — Z7689 Persons encountering health services in other specified circumstances: Secondary | ICD-10-CM

## 2019-11-27 DIAGNOSIS — G894 Chronic pain syndrome: Secondary | ICD-10-CM

## 2019-11-27 DIAGNOSIS — K219 Gastro-esophageal reflux disease without esophagitis: Secondary | ICD-10-CM

## 2019-11-27 DIAGNOSIS — Z720 Tobacco use: Secondary | ICD-10-CM

## 2019-11-27 DIAGNOSIS — F1190 Opioid use, unspecified, uncomplicated: Secondary | ICD-10-CM

## 2019-11-27 DIAGNOSIS — M2170 Unequal limb length (acquired), unspecified site: Secondary | ICD-10-CM

## 2019-11-27 DIAGNOSIS — M419 Scoliosis, unspecified: Secondary | ICD-10-CM

## 2019-11-27 MED ORDER — MONTELUKAST SODIUM 10 MG PO TABS: 10.0000 mg | ORAL_TABLET | Freq: Every day | ORAL | 0 refills | Status: DC

## 2019-11-27 NOTE — Progress Notes (Signed)
New Patient Office Visit  Subjective:  Patient ID: [name], female    DOB: [birth year]  Age: 39 y.o. MRN: [id number]  CC:  Chief Complaint  Patient presents with   New Patient (Initial Visit)    HPI [name] presents to establish care. Patient reports she has had 9 back surgeries related to DDD/scoliosis and is followed by Dr. Atilano Ina s/p spinal surgery. Patient is followed by pain management for chronic pain.  Has a history of chronic asthma and is no longer taking Symbicort because she noticed no difference.  Uses albuterol about 2-3 times per week and states seasonal allergies trigger her asthma.  Recently has experienced increased stress which is triggering her migraines for which she takes Imitrex.  Takes omeprazole 40 mg as needed for heartburn symptoms.  Requesting a referral to [facility] for left knee pain.  States she has seen Dr. Raeford Razor with  sports medicine but would like to see an orthopedist.  States in 2006 was diagnosed with congestive heart failure which she thinks is inaccurate.  Denies chest pain, shortness of breath, dizziness, or lower extremity swelling. Reports has not seen a cardiologist since then. Former smoker, quit 02/2019. Denies alcohol or substance use.  Past Medical History:  Diagnosis Date   Anxiety    no meds   Asthma    "very badly" (01/17/2012)   Cervical cancer ([place]) 1999; 2000   CHF (congestive heart failure) ([place]) 2006   Resolved -History"with 2006 pregnancy, toxcemia "  not seeing a cardiologist now.   Chronic back pain    Chronic bronchitis ([place])    "@ least once/yr" (01/17/2012) - none since 40/9811   Complication of anesthesia    back surgeries x 6 - fusions T8-S2- rods   Degenerative disk disease    Difficult intubation    "needs small tube"   Family history of anesthesia complication    Nausea   GERD (gastroesophageal reflux disease)    Migraines    last migraine 09/2013    Neuromuscular disorder ([place])    sciatica  - affecting left leg numb/pain   Pneumonia    "~ once/yr" (01/17/2012) none since 01/2012   PONV (postoperative nausea and vomiting)    Respiratory failure ([place]) 11/2010; 09/2011   Resolved  - History pneumonia    Past Surgical History:  Procedure Laterality Date   ADENOIDECTOMY, TONSILLECTOMY AND [place]?   ANTERIOR LUMBAR FUSION  01/17/2012   Procedure: ANTERIOR LUMBAR FUSION 2 LEVELS;  Surgeon: Lowella Grip, MD;  Location: [facility];  Service: Orthopedics;  Laterality: Bilateral;  Anterior Lumbar 3-4, Lumbar 4-5 fusion with bone graft harvest   ANTERIOR LUMBAR FUSION     BACK SURGERY  02/19/2014   CERVICAL CONE BIOPSY  1999; 2000   cervical cancer   CESAREAN SECTION N/A 11/08/2013   Procedure: REPEAT CESAREAN SECTION;  Surgeon: Shelly Bombard, MD;  Location: [facility];  Service: Obstetrics;  Laterality: N/A;   KNEE ARTHROSCOPY  2004 X 2   "left X 2" (01/17/2012)   LUMBAR DISC SURGERY  06/1998   "L4-5" (01/17/2012)   LUMBAR FUSION  08/1998; 2010; 2012; 06/2011, 02/19/14   "fusion cage; put rods in; reanchored screws; rod broke" (01/17/2012); Had son in 10/2013 and rods in back broke.    SPINAL FUSION  10/04/2011   Procedure: FUSION POSTERIOR SPINAL MULTILEVEL;  Surgeon: Lowella Grip, MD;  Location: [facility];  Service: Orthopedics;  Laterality: Bilateral;  repair lumbar pseudoarthritis, revision segmental fixation, L3-L4, L4-L5   SPINAL FUSION  07/06/2015   T7 - S2   TONSILLECTOMY      Family History  Problem Relation Age of Onset   COPD Mother    Breast cancer Mother    Heart disease Father 25   Hypertension Father    Diabetes Father        ESRD on dialysis   Deep vein thrombosis Father    Lung cancer Father    Cancer Other    Heart attack Sister 51       Died with MI   Drug abuse Brother    Depression Brother    Hyperlipidemia Brother    Hypertension Brother     Social  History   Socioeconomic History   Marital status: Married    Spouse name: Not on file   Number of children: 1   Years of education: Not on file   Highest education level: Not on file  Occupational History   Occupation: Disabled  Tobacco Use   Smoking status: Former Smoker    Years: 15.00    Types: Cigarettes    Quit date: 02/15/2019    Years since quitting: 0.7   Smokeless tobacco: Never Used   Tobacco comment: 1/4-1/2 PPD  Vaping Use   Vaping Use: Never used  Substance and Sexual Activity   Alcohol use: No   Drug use: No   Sexual activity: Yes    Partners: Male    Birth control/protection: I.U.D.  Other Topics Concern   Not on file  Social History Narrative   Married, 2 children   disabled   Social Determinants of Health   Financial Resource Strain:    Difficulty of Paying Living Expenses: Not on file  Food Insecurity:    Worried About Charity fundraiser in the Last Year: Not on file   [employer] of Food in the Last Year: Not on file  Transportation Needs:    Lack of Transportation (Medical): Not on file   Lack of Transportation (Non-Medical): Not on file  Physical Activity:    Days of Exercise per Week: Not on file   Minutes of Exercise per Session: Not on file  Stress:    Feeling of Stress : Not on file  Social Connections:    Frequency of Communication with Friends and Family: Not on file   Frequency of Social Gatherings with Friends and Family: Not on file   Attends Religious Services: Not on file   Active Member of Clubs or Organizations: Not on file   Attends Archivist Meetings: Not on file   Marital Status: Not on file  Intimate Partner Violence:    Fear of Current or Ex-Partner: Not on file   Emotionally Abused: Not on file   Physically Abused: Not on file   Sexually Abused: Not on file    ROS Review of Systems A fourteen system review of systems was performed and found to be positive as per  HPI.  Objective:   Today's Vitals: BP 96/63    Pulse 90    Ht 5\' 3"  (1.6 m)    Wt 181 lb 14.4 oz (82.5 kg)    SpO2 97%    BMI 32.22 kg/m   Physical Exam General:  Well Developed, well nourished, appropriate for stated age.  Neuro:  Alert and oriented,  extra-ocular muscles intact  HEENT:  Normocephalic, atraumatic, neck supple Skin:  no gross rash, warm, pink. Cardiac:  RRR, S1 S2 Respiratory:  ECTA B/L and A/P, Not using accessory muscles, speaking in full sentences- unlabored. MSK: Abnormal gait present (leg length discrepancy) Vascular:  Ext warm, no cyanosis apprec.; cap RF less 2 sec. Psych:  No HI/SI, judgement and insight good, Euthymic mood. Full Affect.  Assessment & Plan:   Problem List Items Addressed This Visit      Cardiovascular and Mediastinum   Migraines    -Continue current medication regimen. -Will continue to monitor. -Recommend to reduce/avoid headache triggers.      CHF (congestive heart failure) (HCC)    -Reviewed Dr. Alver Fisher note and advised to follow up prn. -Last echo 08/01/2007 revealed LVEF of 55%        Respiratory   Asthma, chronic    -Per patient's albuterol use indicates mild persistent asthma. Discussed with patient starting Singulair to help reduce asthma exacerbations related to environmental and seasonal allergies. Patient is agreeable. -Continue albuterol as needed.  -Will continue to monitor.      Relevant Medications   montelukast (SINGULAIR) 10 MG tablet     Digestive   GERD without esophagitis    -Stable -Continue current medication as needed. -Recommend to avoid/reduce provocative foods. -Will continue to monitor.        Musculoskeletal and Integument   Scoliosis    -Followed by Neurosurgery.        Other   S/P lumbar spinal fusion    -Followed by Neurosurgery, Dr. Atilano Ina.       Acquired leg length discrepancy   Vitamin D deficiency    -Last Vitamin D 3 years ago, low at 24.36 -Plan to repeat  with CPE      Tobacco use    -Quit 02/2019 -Continue with tobacco cessation.       Other Visit Diagnoses    Encounter to establish care    -  Primary   Seasonal allergies       Relevant Medications   montelukast (SINGULAIR) 10 MG tablet   Chronic pain of left knee       Relevant Orders   Ambulatory referral to Orthopedics   Chronic pain syndrome       Chronic, continuous use of opioids         Chronic pain syndrome, Chronic continuous use of opioids: -Continue to follow up with Pain Management. -On Oxycodone and Valium.  Chronic pain of left knee: -Placed Orthopedic referral per patient request to [facility].  Seasonal allergies: -Recommend to take over-the-counter antihistamine and start Singulair. -Will continue to monitor.   Outpatient Encounter Medications as of 11/27/2019  Medication Sig   DIAZEPAM PO Take 10 mg by mouth daily as needed (for muscle pain---patient reported).   oxyCODONE (ROXICODONE) 15 MG immediate release tablet Take 15 mg by mouth every 4 (four) hours as needed for pain.   VENTOLIN HFA 108 (90 Base) MCG/ACT inhaler Inhale 2 puffs into the lungs every 6 (six) hours as needed for wheezing or shortness of breath.   AMBULATORY NON FORMULARY MEDICATION Rolling Knee Scooter, please take Rx to medical supply store. (Patient not taking: Reported on 11/27/2019)   budesonide-formoterol (SYMBICORT) 80-4.5 MCG/ACT inhaler Inhale 2 puffs into the lungs 2 (two) times daily. (Patient not taking: Reported on 11/27/2019)   Cholecalciferol (VITAMIN D PO) Take 500 mg by mouth 2 (two) times daily. (Patient not taking: Reported on 11/27/2019)   loratadine (CLARITIN) 10 MG tablet TAKE 1 TABLET BY MOUTH ONCE DAILY (Patient not taking: Reported on 11/27/2019)  misoprostol (CYTOTEC) 200 MCG tablet 400 mcg ( 2 tablets ) vaginally 3-4 hours prior to scheduled procedure. (Patient not taking: Reported on 11/27/2019)   montelukast (SINGULAIR) 10 MG tablet Take 1  tablet (10 mg total) by mouth at bedtime.   omeprazole (PRILOSEC) 40 MG capsule Take 1 capsule (40 mg total) by mouth daily. (Patient not taking: Reported on 11/27/2019)   ondansetron (ZOFRAN) 4 MG tablet Take 1 tablet (4 mg total) by mouth every 8 (eight) hours as needed for nausea or vomiting. (Patient not taking: Reported on 11/27/2019)   SUMAtriptan (IMITREX) 50 MG tablet Take 1 tablet (50 mg total) by mouth every 2 (two) hours as needed for migraine. May repeat in 2 hours if headache persists or recurs. (Patient not taking: Reported on 11/27/2019)   tinidazole (TINDAMAX) 500 MG tablet Take 2 tablets (1,000 mg total) by mouth daily with breakfast. (Patient not taking: Reported on 11/27/2019)   [DISCONTINUED] amitriptyline (ELAVIL) 25 MG tablet Take 1 tablet (25 mg total) by mouth at bedtime. (Patient not taking: Reported on 11/27/2019)   [DISCONTINUED] carisoprodol (SOMA) 350 MG tablet  (Patient not taking: Reported on 11/27/2019)   [DISCONTINUED] gabapentin (NEURONTIN) 300 MG capsule  (Patient not taking: Reported on 11/27/2019)   [DISCONTINUED] predniSONE (DELTASONE) 5 MG tablet Take 6 pills for first day, 5 pills second day, 4 pills third day, 3 pills fourth day, 2 pills the fifth day, and 1 pill sixth day. (Patient not taking: Reported on 11/27/2019)   No facility-administered encounter medications on file as of 11/27/2019.    Follow-up: Return for CPE with [facility] few days before.   Note:  This note was prepared with assistance of Dragon voice recognition software. Occasional wrong-word or sound-a-like substitutions may have occurred due to the inherent limitations of voice recognition software.   Lorrene Reid, PA-C

## 2019-11-27 NOTE — Patient Instructions (Signed)
Allergies, Adult °An allergy is when your body's defense system (immune system) overreacts to an otherwise harmless substance (allergen) that you breathe in or eat or something that touches your skin. When you come into contact with something that you are allergic to, your immune system produces certain proteins (antibodies). These proteins cause cells to release chemicals (histamines) that trigger the symptoms of an allergic reaction. °Allergies often affect the nasal passages (allergic rhinitis), eyes (allergic conjunctivitis), skin (atopic dermatitis), and stomach. Allergies can be mild or severe. Allergies cannot spread from person to person (are not contagious). They can develop at any age and may be outgrown. °What increases the risk? °You may be at greater risk of allergies if other people in your family have allergies. °What are the signs or symptoms? °Symptoms depend on what type of allergy you have. They may include: °· Runny, stuffy nose. °· Sneezing. °· Itchy mouth, ears, or throat. °· Postnasal drip. °· Sore throat. °· Itchy, red, watery, or puffy eyes. °· Skin rash or hives. °· Stomach pain. °· Vomiting. °· Diarrhea. °· Bloating. °· Wheezing or coughing. °People with a severe allergy to food, medicine, or an insect bite may have a life-threatening allergic reaction (anaphylaxis). Symptoms of anaphylaxis include: °· Hives. °· Itching. °· Flushed face. °· Swollen lips, tongue, or mouth. °· Tight or swollen throat. °· Chest pain or tightness in the chest. °· Trouble breathing or shortness of breath. °· Rapid heartbeat. °· Dizziness or fainting. °· Vomiting. °· Diarrhea. °· Pain in the abdomen. °How is this diagnosed? °This condition is diagnosed based on: °· Your symptoms. °· Your family and medical history. °· A physical exam. °You may need to see a health care provider who specializes in treating allergies (allergist). You may also have tests, including: °· Skin tests to see which allergens are causing  your symptoms, such as: °? Skin prick test. In this test, your skin is pricked with a tiny needle and exposed to small amounts of possible allergens to see if your skin reacts. °? Intradermal skin test. In this test, a small amount of allergen is injected under your skin to see if your skin reacts. °? Patch test. In this test, a small amount of allergen is placed on your skin and then your skin is covered with a bandage. Your health care provider will check your skin after a couple of days to see if a rash has developed. °· Blood tests. °· Challenges tests. In this test, you inhale a small amount of allergen by mouth to see if you have an allergic reaction. °You may also be asked to: °· Keep a food diary. A food diary is a record of all the foods and drinks you have in a day and any symptoms you experience. °· Practice an elimination diet. An elimination diet involves eliminating specific foods from your diet and then adding them back in one by one to find out if a certain food causes an allergic reaction. °How is this treated? °Treatment for allergies depends on your symptoms. Treatment may include: °· Cold compresses to soothe itching and swelling. °· Eye drops. °· Nasal sprays. °· Using a saline spray or container (neti pot) to flush out the nose (nasal irrigation). These methods can help clear away mucus and keep the nasal passages moist. °· Using a humidifier. °· Oral antihistamines or other medicines to block allergic reaction and inflammation. °· Skin creams to treat rashes or itching. °· Diet changes to eliminate food allergy triggers. °·   Repeated exposure to tiny amounts of allergens to build up a tolerance and prevent future allergic reactions (immunotherapy). These include: ? Allergy shots. ? Oral treatment. This involves taking small doses of an allergen under the tongue (sublingual immunotherapy).  Emergency epinephrine injection (auto-injector) in case of an allergic emergency. This is a  self-injectable, pre-measured medicine that must be given within the first few minutes of a serious allergic reaction. Follow these instructions at home:         Avoid known allergens whenever possible.  If you suffer from airborne allergens, wash out your nose daily. You can do this with a saline spray or a neti pot to flush out your nose (nasal irrigation).  Take over-the-counter and prescription medicines only as told by your health care provider.  Keep all follow-up visits as told by your health care provider. This is important.  If you are at risk of a severe allergic reaction (anaphylaxis), keep your auto-injector with you at all times.  If you have ever had anaphylaxis, wear a medical alert bracelet or necklace that states you have a severe allergy. Contact a health care provider if:  Your symptoms do not improve with treatment. Get help right away if:  You have symptoms of anaphylaxis, such as: ? Swollen mouth, tongue, or throat. ? Pain or tightness in your chest. ? Trouble breathing or shortness of breath. ? Dizziness or fainting. ? Severe abdominal pain, vomiting, or diarrhea. This information is not intended to replace advice given to you by your health care provider. Make sure you discuss any questions you have with your health care provider. Document Revised: 04/26/2017 Document Reviewed: 08/19/2015 Elsevier Patient Education  [place]. Asthma, Adult  Asthma is a long-term (chronic) condition in which the airways get tight and narrow. The airways are the breathing passages that lead from the nose and mouth down into the lungs. A person with asthma will have times when symptoms get worse. These are called asthma attacks. They can cause coughing, whistling sounds when you breathe (wheezing), shortness of breath, and chest pain. They can make it hard to breathe. There is no cure for asthma, but medicines and lifestyle changes can help control it. There are many  things that can bring on an asthma attack or make asthma symptoms worse (triggers). Common triggers include:  Mold.  Dust.  Cigarette smoke.  Cockroaches.  Things that can cause allergy symptoms (allergens). These include animal skin flakes (dander) and pollen from trees or grass.  Things that pollute the air. These may include household cleaners, wood smoke, smog, or chemical odors.  Cold air, weather changes, and wind.  Crying or laughing hard.  Stress.  Certain medicines or drugs.  Certain foods such as dried fruit, potato chips, and grape juice.  Infections, such as a cold or the flu.  Certain medical conditions or diseases.  Exercise or tiring activities. Asthma may be treated with medicines and by staying away from the things that cause asthma attacks. Types of medicines may include:  Controller medicines. These help prevent asthma symptoms. They are usually taken every day.  Fast-acting reliever or rescue medicines. These quickly relieve asthma symptoms. They are used as needed and provide short-term relief.  Allergy medicines if your attacks are brought on by allergens.  Medicines to help control the body's defense (immune) system. Follow these instructions at home: Avoiding triggers in your home  Change your heating and air conditioning filter often.  Limit your use of fireplaces and wood  stoves.  Get rid of pests (such as roaches and mice) and their droppings.  Throw away plants if you see mold on them.  Clean your floors. Dust regularly. Use cleaning products that do not smell.  Have someone vacuum when you are not home. Use a vacuum cleaner with a HEPA filter if possible.  Replace carpet with wood, tile, or vinyl flooring. Carpet can trap animal skin flakes and dust.  Use allergy-proof pillows, mattress covers, and box spring covers.  Wash bed sheets and blankets every week in hot water. Dry them in a dryer.  Keep your bedroom free of any  triggers.  Avoid pets and keep windows closed when things that cause allergy symptoms are in the air.  Use blankets that are made of polyester or cotton.  Clean bathrooms and kitchens with bleach. If possible, have someone repaint the walls in these rooms with mold-resistant paint. Keep out of the rooms that are being cleaned and painted.  Wash your hands often with soap and water. If soap and water are not available, use hand sanitizer.  Do not allow anyone to smoke in your home. General instructions  Take over-the-counter and prescription medicines only as told by your doctor. ? Talk with your doctor if you have questions about how or when to take your medicines. ? Make note if you need to use your medicines more often than usual.  Do not use any products that contain nicotine or tobacco, such as cigarettes and e-cigarettes. If you need help quitting, ask your doctor.  Stay away from secondhand smoke.  Avoid doing things outdoors when allergen counts are high and when air quality is low.  Wear a ski mask when doing outdoor activities in the winter. The mask should cover your nose and mouth. Exercise indoors on cold days if you can.  Warm up before you exercise. Take time to cool down after exercise.  Use a peak flow meter as told by your doctor. A peak flow meter is a tool that measures how well the lungs are working.  Keep track of the peak flow meter's readings. Write them down.  Follow your asthma action plan. This is a written plan for taking care of your asthma and treating your attacks.  Make sure you get all the shots (vaccines) that your doctor recommends. Ask your doctor about a flu shot and a pneumonia shot.  Keep all follow-up visits as told by your doctor. This is important. Contact a doctor if:  You have wheezing, shortness of breath, or a cough even while taking medicine to prevent attacks.  The mucus you cough up (sputum) is thicker than usual.  The mucus  you cough up changes from clear or white to yellow, green, gray, or bloody.  You have problems from the medicine you are taking, such as: ? A rash. ? Itching. ? Swelling. ? Trouble breathing.  You need reliever medicines more than 2-3 times a week.  Your peak flow reading is still at 50-79% of your personal best after following the action plan for 1 hour.  You have a fever. Get help right away if:  You seem to be worse and are not responding to medicine during an asthma attack.  You are short of breath even at rest.  You get short of breath when doing very little activity.  You have trouble eating, drinking, or talking.  You have chest pain or tightness.  You have a fast heartbeat.  Your lips or fingernails start  to turn blue.  You are light-headed or dizzy, or you faint.  Your peak flow is less than 50% of your personal best.  You feel too tired to breathe normally. Summary  Asthma is a long-term (chronic) condition in which the airways get tight and narrow. An asthma attack can make it hard to breathe.  Asthma cannot be cured, but medicines and lifestyle changes can help control it.  Make sure you understand how to avoid triggers and how and when to use your medicines. This information is not intended to replace advice given to you by your health care provider. Make sure you discuss any questions you have with your health care provider. Document Revised: 04/05/2018 Document Reviewed: 03/07/2016 Elsevier Patient Education  2020 [employer].

## 2019-11-28 DIAGNOSIS — M62838 Other muscle spasm: Secondary | ICD-10-CM | POA: Diagnosis not present

## 2019-11-28 DIAGNOSIS — Z79899 Other long term (current) drug therapy: Secondary | ICD-10-CM | POA: Diagnosis not present

## 2019-11-28 DIAGNOSIS — M545 Low back pain, unspecified: Secondary | ICD-10-CM | POA: Diagnosis not present

## 2019-11-29 NOTE — Assessment & Plan Note (Addendum)
-  Followed by Neurosurgery, Dr. Atilano Ina.

## 2019-11-29 NOTE — Assessment & Plan Note (Signed)
-  Last Vitamin D 3 years ago, low at 24.36 -Plan to repeat with CPE

## 2019-11-29 NOTE — Assessment & Plan Note (Signed)
-  Continue current medication regimen. -Will continue to monitor. -Recommend to reduce/avoid headache triggers.

## 2019-11-29 NOTE — Assessment & Plan Note (Signed)
-  Stable -Continue current medication as needed. -Recommend to avoid/reduce provocative foods. -Will continue to monitor.

## 2019-11-29 NOTE — Assessment & Plan Note (Addendum)
-  Reviewed Dr. Alver Fisher note and advised to follow up prn. -Last echo 08/01/2007 revealed LVEF of 55%

## 2019-11-29 NOTE — Assessment & Plan Note (Addendum)
-  Per patient's albuterol use indicates mild persistent asthma. Discussed with patient starting Singulair to help reduce asthma exacerbations related to environmental and seasonal allergies. Patient is agreeable. -Continue albuterol as needed.  -Will continue to monitor.

## 2019-11-29 NOTE — Assessment & Plan Note (Signed)
-  Quit 02/2019 -Continue with tobacco cessation.

## 2019-11-29 NOTE — Assessment & Plan Note (Signed)
-  Followed by Neurosurgery.

## 2019-12-18 ENCOUNTER — Other Ambulatory Visit: Payer: Medicare HMO

## 2019-12-24 ENCOUNTER — Other Ambulatory Visit: Payer: Self-pay | Admitting: Physician Assistant

## 2019-12-24 DIAGNOSIS — E785 Hyperlipidemia, unspecified: Secondary | ICD-10-CM

## 2019-12-24 DIAGNOSIS — E559 Vitamin D deficiency, unspecified: Secondary | ICD-10-CM

## 2019-12-24 DIAGNOSIS — Z0000 Encounter for general adult medical examination without abnormal findings: Secondary | ICD-10-CM

## 2019-12-25 ENCOUNTER — Other Ambulatory Visit: Payer: Medicare HMO

## 2019-12-26 DIAGNOSIS — Z79899 Other long term (current) drug therapy: Secondary | ICD-10-CM | POA: Diagnosis not present

## 2019-12-26 DIAGNOSIS — M79604 Pain in right leg: Secondary | ICD-10-CM | POA: Diagnosis not present

## 2019-12-26 DIAGNOSIS — M62838 Other muscle spasm: Secondary | ICD-10-CM | POA: Diagnosis not present

## 2019-12-26 DIAGNOSIS — M545 Low back pain, unspecified: Secondary | ICD-10-CM | POA: Diagnosis not present

## 2019-12-27 ENCOUNTER — Other Ambulatory Visit: Payer: Medicare HMO

## 2019-12-31 ENCOUNTER — Telehealth: Payer: Self-pay

## 2019-12-31 ENCOUNTER — Encounter: Payer: Medicare HMO | Admitting: Physician Assistant

## 2019-12-31 DIAGNOSIS — J4540 Moderate persistent asthma, uncomplicated: Secondary | ICD-10-CM

## 2019-12-31 DIAGNOSIS — J302 Other seasonal allergic rhinitis: Secondary | ICD-10-CM

## 2019-12-31 NOTE — Telephone Encounter (Signed)
Received fax refill request for montelukast.  Refill denied as refill has been requested too soon.  Charyl Bigger, CMA

## 2020-01-06 ENCOUNTER — Other Ambulatory Visit: Payer: Self-pay

## 2020-01-06 DIAGNOSIS — J302 Other seasonal allergic rhinitis: Secondary | ICD-10-CM

## 2020-01-06 DIAGNOSIS — J4540 Moderate persistent asthma, uncomplicated: Secondary | ICD-10-CM

## 2020-01-23 DIAGNOSIS — M62838 Other muscle spasm: Secondary | ICD-10-CM | POA: Diagnosis not present

## 2020-01-23 DIAGNOSIS — M545 Low back pain, unspecified: Secondary | ICD-10-CM | POA: Diagnosis not present

## 2020-01-23 DIAGNOSIS — Z79899 Other long term (current) drug therapy: Secondary | ICD-10-CM | POA: Diagnosis not present

## 2020-02-17 ENCOUNTER — Other Ambulatory Visit: Payer: Self-pay | Admitting: Physician Assistant

## 2020-02-17 DIAGNOSIS — Z0000 Encounter for general adult medical examination without abnormal findings: Secondary | ICD-10-CM

## 2020-02-17 DIAGNOSIS — E785 Hyperlipidemia, unspecified: Secondary | ICD-10-CM

## 2020-02-17 DIAGNOSIS — E559 Vitamin D deficiency, unspecified: Secondary | ICD-10-CM

## 2020-02-20 ENCOUNTER — Other Ambulatory Visit: Payer: Medicare HMO

## 2020-02-20 DIAGNOSIS — M62838 Other muscle spasm: Secondary | ICD-10-CM | POA: Diagnosis not present

## 2020-02-20 DIAGNOSIS — Z79899 Other long term (current) drug therapy: Secondary | ICD-10-CM | POA: Diagnosis not present

## 2020-02-20 DIAGNOSIS — M545 Low back pain, unspecified: Secondary | ICD-10-CM | POA: Diagnosis not present

## 2020-02-26 ENCOUNTER — Encounter: Payer: Medicare HMO | Admitting: Physician Assistant

## 2020-03-11 ENCOUNTER — Other Ambulatory Visit: Payer: Self-pay | Admitting: Family

## 2020-03-12 DIAGNOSIS — M47813 Spondylosis without myelopathy or radiculopathy, cervicothoracic region: Secondary | ICD-10-CM | POA: Diagnosis not present

## 2020-03-12 DIAGNOSIS — Z9889 Other specified postprocedural states: Secondary | ICD-10-CM | POA: Diagnosis not present

## 2020-03-12 DIAGNOSIS — M47812 Spondylosis without myelopathy or radiculopathy, cervical region: Secondary | ICD-10-CM | POA: Diagnosis not present

## 2020-03-12 DIAGNOSIS — M47814 Spondylosis without myelopathy or radiculopathy, thoracic region: Secondary | ICD-10-CM | POA: Diagnosis not present

## 2020-03-12 DIAGNOSIS — M546 Pain in thoracic spine: Secondary | ICD-10-CM | POA: Diagnosis not present

## 2020-03-19 DIAGNOSIS — Z79899 Other long term (current) drug therapy: Secondary | ICD-10-CM | POA: Diagnosis not present

## 2020-03-19 DIAGNOSIS — M62838 Other muscle spasm: Secondary | ICD-10-CM | POA: Diagnosis not present

## 2020-03-19 DIAGNOSIS — M545 Low back pain, unspecified: Secondary | ICD-10-CM | POA: Diagnosis not present

## 2020-04-15 DIAGNOSIS — F17200 Nicotine dependence, unspecified, uncomplicated: Secondary | ICD-10-CM | POA: Diagnosis not present

## 2020-04-15 DIAGNOSIS — M545 Low back pain, unspecified: Secondary | ICD-10-CM | POA: Diagnosis not present

## 2020-04-15 DIAGNOSIS — M62838 Other muscle spasm: Secondary | ICD-10-CM | POA: Diagnosis not present

## 2020-04-15 DIAGNOSIS — F17210 Nicotine dependence, cigarettes, uncomplicated: Secondary | ICD-10-CM | POA: Diagnosis not present

## 2020-04-15 DIAGNOSIS — Z79899 Other long term (current) drug therapy: Secondary | ICD-10-CM | POA: Diagnosis not present

## 2020-05-14 DIAGNOSIS — M62838 Other muscle spasm: Secondary | ICD-10-CM | POA: Diagnosis not present

## 2020-05-14 DIAGNOSIS — Z79899 Other long term (current) drug therapy: Secondary | ICD-10-CM | POA: Diagnosis not present

## 2020-05-14 DIAGNOSIS — F17200 Nicotine dependence, unspecified, uncomplicated: Secondary | ICD-10-CM | POA: Diagnosis not present

## 2020-05-14 DIAGNOSIS — F17210 Nicotine dependence, cigarettes, uncomplicated: Secondary | ICD-10-CM | POA: Diagnosis not present

## 2020-05-14 DIAGNOSIS — M545 Low back pain, unspecified: Secondary | ICD-10-CM | POA: Diagnosis not present

## 2020-06-11 DIAGNOSIS — M62838 Other muscle spasm: Secondary | ICD-10-CM | POA: Diagnosis not present

## 2020-06-11 DIAGNOSIS — M545 Low back pain, unspecified: Secondary | ICD-10-CM | POA: Diagnosis not present

## 2020-06-11 DIAGNOSIS — Z79899 Other long term (current) drug therapy: Secondary | ICD-10-CM | POA: Diagnosis not present

## 2020-06-11 DIAGNOSIS — F17210 Nicotine dependence, cigarettes, uncomplicated: Secondary | ICD-10-CM | POA: Diagnosis not present

## 2020-06-11 DIAGNOSIS — F17200 Nicotine dependence, unspecified, uncomplicated: Secondary | ICD-10-CM | POA: Diagnosis not present

## 2020-07-06 ENCOUNTER — Other Ambulatory Visit: Payer: Self-pay | Admitting: Family

## 2020-10-28 ENCOUNTER — Other Ambulatory Visit: Payer: Self-pay | Admitting: Physician Assistant

## 2020-11-05 ENCOUNTER — Other Ambulatory Visit: Payer: Self-pay

## 2020-11-05 ENCOUNTER — Ambulatory Visit (INDEPENDENT_AMBULATORY_CARE_PROVIDER_SITE_OTHER): Payer: Medicare HMO | Admitting: Physician Assistant

## 2020-11-05 ENCOUNTER — Encounter: Payer: Self-pay | Admitting: Physician Assistant

## 2020-11-05 VITALS — BP 108/69 | HR 80 | Temp 98.3°F | Ht 62.5 in | Wt 168.2 lb

## 2020-11-05 DIAGNOSIS — Z23 Encounter for immunization: Secondary | ICD-10-CM

## 2020-11-05 DIAGNOSIS — K219 Gastro-esophageal reflux disease without esophagitis: Secondary | ICD-10-CM

## 2020-11-05 DIAGNOSIS — E785 Hyperlipidemia, unspecified: Secondary | ICD-10-CM

## 2020-11-05 DIAGNOSIS — Z0000 Encounter for general adult medical examination without abnormal findings: Secondary | ICD-10-CM

## 2020-11-05 DIAGNOSIS — Z1159 Encounter for screening for other viral diseases: Secondary | ICD-10-CM

## 2020-11-05 DIAGNOSIS — E559 Vitamin D deficiency, unspecified: Secondary | ICD-10-CM

## 2020-11-05 DIAGNOSIS — B351 Tinea unguium: Secondary | ICD-10-CM

## 2020-11-05 DIAGNOSIS — Z1231 Encounter for screening mammogram for malignant neoplasm of breast: Secondary | ICD-10-CM

## 2020-11-05 MED ORDER — CICLOPIROX 8 % EX SOLN: Freq: Every day | CUTANEOUS | 0 refills | Status: DC

## 2020-11-05 MED ORDER — OMEPRAZOLE 40 MG PO CPDR: 40.0000 mg | DELAYED_RELEASE_CAPSULE | Freq: Every day | ORAL | 1 refills | Status: DC

## 2020-11-05 NOTE — Progress Notes (Signed)
Subjective:   [name] is a 40 y.o. female who presents for an Initial Medicare Annual Wellness Visit.  Review of Systems    Review of Systems: General:   No F/C, wt loss Pulm:   No DIB, SOB, pleuritic chest pain Card:  No CP, palpitations Abd:  No n/v/d or pain Ext:  No inc edema from baseline    Objective:    Today's Vitals   11/05/20 1320  BP: 108/69  Pulse: 80  Temp: 98.3 F (36.8 C)  SpO2: 100%  Weight: 168 lb 3.2 oz (76.3 kg)  Height: 5' 2.5" (1.588 m)   Body mass index is 30.27 kg/m.   General Appearance:    Alert, cooperative, in no acute distress, appears stated age   Head:    Normocephalic, without obvious abnormality, atraumatic  Eyes:    PERRL, conjunctiva/corneas clear, EOM's intact, fundi    benign, both eyes  Ears:    Normal TM's and external ear canals, both ears  Nose:   Nares normal, septum midline, mucosa normal, no drainage    or sinus tenderness  Throat:   Lips, mucosa, and tongue normal; teeth and gums normal  Neck:   Supple, symmetrical, trachea midline, no adenopathy;    thyroid:  no enlargement/tenderness/nodules; no JVD  Back:     Symmetric, lumbar surgical scar noted, ROM normal, no CVA tenderness  Lungs:     Clear to auscultation bilaterally, respirations unlabored  Chest Wall:    No tenderness or deformity   Heart:    Normal heart rate. Normal rhythm. No murmurs, rubs, or gallops.    Breast Exam:    deferred to OB/GYN  Abdomen:     Soft, non-tender, bowel sounds active all four quadrants,    no masses, no organomegaly  Pelvic:    deferred to OB/GYN  Extremities:   All extremities are intact. No cyanosis or edema, thick and discoloration of nails noted  Pulses:   2+ and symmetric all extremities  Skin:   Skin color, texture, turgor normal, no rashes or lesions  Lymph nodes:   Cervical, supraclavicular, and axillary nodes normal  Neurologic:   CNII-XII intact, normal strength, sensation and reflexes    throughout      Advanced Directives 07/12/2016 01/25/2016 10/17/2014 10/10/2014 12/12/2013 11/06/2013 11/05/2013  Does Patient Have a Medical Advance Directive? _0  No No  Would patient like information on creating a medical advance directive? - No - Patient declined No - patient declined information No - patient declined information No - patient declined information No - patient declined information No - patient declined information  Pre-existing out of facility DNR order (yellow form or pink MOST form) - - - - - - -    Current Medications (verified) Outpatient Encounter Medications as of 11/05/2020  Medication Sig   budesonide-formoterol (SYMBICORT) 80-4.5 MCG/ACT inhaler Inhale 2 puffs into the lungs 2 (two) times daily.   Cholecalciferol (VITAMIN D PO) Take 500 mg by mouth 2 (two) times daily.   ciclopirox (PENLAC) 8 % solution Apply topically at bedtime. Apply over nail and surrounding skin. Apply daily over previous coat. After seven (7) days, may remove with alcohol and continue cycle.   DIAZEPAM PO Take 10 mg by mouth daily as needed (for muscle pain---patient reported).   loratadine (CLARITIN) 10 MG tablet TAKE 1 TABLET BY MOUTH ONCE DAILY   ondansetron (ZOFRAN) 4 MG tablet Take 1 tablet (4 mg total) by mouth every 8 (eight)  hours as needed for nausea or vomiting.   oxyCODONE (ROXICODONE) 15 MG immediate release tablet Take 15 mg by mouth every 4 (four) hours as needed for pain.   SUMAtriptan (IMITREX) 50 MG tablet Take 1 tablet (50 mg total) by mouth every 2 (two) hours as needed for migraine. May repeat in 2 hours if headache persists or recurs.   VENTOLIN HFA 108 (90 Base) MCG/ACT inhaler INHALE 2 PUFFS INTO THE LUNGS EVERY 6 HOURS AS NEEDED FOR WHEEZING OR SHORTNESS OF BREATH.   [DISCONTINUED] AMBULATORY NON FORMULARY MEDICATION Rolling Knee Scooter, please take Rx to medical supply store.   [DISCONTINUED] misoprostol (CYTOTEC) 200 MCG tablet 400 mcg ( 2 tablets ) vaginally 3-4 hours  prior to scheduled procedure.   [DISCONTINUED] montelukast (SINGULAIR) 10 MG tablet Take 1 tablet (10 mg total) by mouth at bedtime.   [DISCONTINUED] omeprazole (PRILOSEC) 40 MG capsule Take 1 capsule (40 mg total) by mouth daily.   [DISCONTINUED] tinidazole (TINDAMAX) 500 MG tablet Take 2 tablets (1,000 mg total) by mouth daily with breakfast.   omeprazole (PRILOSEC) 40 MG capsule Take 1 capsule (40 mg total) by mouth daily.   No facility-administered encounter medications on file as of 11/05/2020.    Allergies (verified) Iodine and Other   History: Past Medical History:  Diagnosis Date   Anxiety    no meds   Asthma    "very badly" (01/17/2012)   Cervical cancer ([place]) 1999; 2000   CHF (congestive heart failure) ([place]) 2006   Resolved -History"with 2006 pregnancy, toxcemia "  not seeing a cardiologist now.   Chronic back pain    Chronic bronchitis ([place])    "@ least once/yr" (01/17/2012) - none since 45/4098   Complication of anesthesia    back surgeries x 6 - fusions T8-S2- rods   Degenerative disk disease    Difficult intubation    "needs small tube"   Family history of anesthesia complication    Nausea   GERD (gastroesophageal reflux disease)    Migraines    last migraine 09/2013   Neuromuscular disorder ([place])    sciatica  - affecting left leg numb/pain   Pneumonia    "~ once/yr" (01/17/2012) none since 01/2012   PONV (postoperative nausea and vomiting)    Respiratory failure ([place]) 11/2010; 09/2011   Resolved  - History pneumonia   Past Surgical History:  Procedure Laterality Date   ADENOIDECTOMY, TONSILLECTOMY AND [place]?   ANTERIOR LUMBAR FUSION  01/17/2012   Procedure: ANTERIOR LUMBAR FUSION 2 LEVELS;  Surgeon: Lowella Grip, MD;  Location: [facility];  Service: Orthopedics;  Laterality: Bilateral;  Anterior Lumbar 3-4, Lumbar 4-5 fusion with bone graft harvest   ANTERIOR LUMBAR FUSION     BACK SURGERY  02/19/2014   CERVICAL CONE BIOPSY   1999; 2000   cervical cancer   CESAREAN SECTION N/A 11/08/2013   Procedure: REPEAT CESAREAN SECTION;  Surgeon: Shelly Bombard, MD;  Location: [facility];  Service: Obstetrics;  Laterality: N/A;   KNEE ARTHROSCOPY  2004 X 2   "left X 2" (01/17/2012)   LUMBAR DISC SURGERY  06/1998   "L4-5" (01/17/2012)   LUMBAR FUSION  08/1998; 2010; 2012; 06/2011, 02/19/14   "fusion cage; put rods in; reanchored screws; rod broke" (01/17/2012); Had son in 10/2013 and rods in back broke.    SPINAL FUSION  10/04/2011   Procedure: FUSION POSTERIOR SPINAL MULTILEVEL;  Surgeon: Lowella Grip, MD;  Location: [facility];  Service: Orthopedics;  Laterality: Bilateral;  repair lumbar pseudoarthritis, revision segmental fixation, L3-L4, L4-L5   SPINAL FUSION  07/06/2015   T7 - S2   TONSILLECTOMY     Family History  Problem Relation Age of Onset   COPD Mother    Breast cancer Mother    Heart disease Father 87   Hypertension Father    Diabetes Father        ESRD on dialysis   Deep vein thrombosis Father    Lung cancer Father    Cancer Other    Heart attack Sister 72       Died with MI   Drug abuse Brother    Depression Brother    Hyperlipidemia Brother    Hypertension Brother    Social History   Socioeconomic History   Marital status: Married    Spouse name: Not on file   Number of children: 1   Years of education: Not on file   Highest education level: Not on file  Occupational History   Occupation: Disabled  Tobacco Use   Smoking status: Former    Years: 15.00    Types: Cigarettes    Quit date: 02/15/2019    Years since quitting: 1.7   Smokeless tobacco: Never   Tobacco comments:    1/4-1/2 PPD  Vaping Use   Vaping Use: Never used  Substance and Sexual Activity   Alcohol use: No   Drug use: No   Sexual activity: Yes    Partners: Male    Birth control/protection: I.U.D.  Other Topics Concern   Not on file  Social History Narrative   Married, 2 children   disabled   Social Determinants of  Health   Financial Resource Strain: Not on file  Food Insecurity: Not on file  Transportation Needs: Not on file  Physical Activity: Not on file  Stress: Not on file  Social Connections: Not on file    Tobacco Counseling Counseling given: Not Answered Tobacco comments: 1/4-1/2 PPD    Diabetic? no         Activities of Daily Living In your present state of health, do you have any difficulty performing the following activities: 11/05/2020 11/27/2019  Hearing? Y N  Vision? Y N  Difficulty concentrating or making decisions? N N  Walking or climbing stairs? Y N  Dressing or bathing? N N  Doing errands, shopping? N N  Some recent data might be hidden    Patient Care Team: Lorrene Reid, PA-C as PCP - General (Physician Assistant) Shelly Bombard, MD as Consulting Physician (Obstetrics and Gynecology) Atilano Ina, MD as Consulting Physician (Neurosurgery) [facility] (Pain Medicine)  Indicate any recent Medical Services you may have received from other than [facility] providers in the past year (date may be approximate).     Assessment:   This is a routine wellness examination for Shailene.  Hearing/Vision screen No results found.  Dietary issues and exercise activities discussed: -Continue weight loss efforts with dietary changes. Follow a heart healthy diet. Continue to stay as active as possible (limited by chronic back pain).   Goals Addressed   None   Depression Screen PHQ 2/9 Scores 11/05/2020 11/27/2019 07/26/2017 03/04/2016 01/25/2016 10/10/2014  PHQ - 2 Score 0 0 2 0 1 4  PHQ- 9 Score 3 0 11 - - 14    Fall Risk Fall Risk  11/05/2020 11/27/2019 03/04/2016 01/25/2016 10/10/2014  Falls in the past year? 0 0 Yes Yes No  Number falls in past yr:  0 - 1 1 -  Comment - - - Fell in bathtub -  Injury with Fall? 0 - No No -  Risk for fall due to : No Fall Risks - - - Impaired mobility;Medication side effect  Risk for fall due to: Comment - - - -  educated and given handout  Follow up Falls evaluation completed Falls evaluation completed - - -    FALL RISK PREVENTION PERTAINING TO THE HOME:  Any stairs in or around the home? Yes  If so, are there any without handrails? No  Home free of loose throw rugs in walkways, pet beds, electrical cords, etc? Yes  Adequate lighting in your home to reduce risk of falls? Yes   ASSISTIVE DEVICES UTILIZED TO PREVENT FALLS:  Life alert? No  Use of a cane, walker or w/c? Yes  Grab bars in the bathroom? Yes  Shower chair or bench in shower? No  Elevated toilet seat or a handicapped toilet? Yes   TIMED UP AND GO:  Was the test performed? Yes .  Length of time to ambulate 10 feet: 12 sec.   Gait slow and steady without use of assistive device  Cognitive Function: wnl's MMSE - Mini Mental State Exam 01/25/2016  Orientation to time 5  Orientation to Place 5  Registration 3  Attention/ Calculation 5  Recall 3  Language- name 2 objects 2  Language- repeat 1  Language- follow 3 step command 3  Language- read & follow direction 1  Write a sentence 1  Copy design 1  Total score 30     6CIT Screen 11/05/2020  What Year? 0 points  What month? 0 points  What time? 0 points  Count back from 20 0 points  Months in reverse 0 points  Repeat phrase 0 points  Total Score 0    Immunizations Immunization History  Administered Date(s) Administered   Influenza,inj,Quad PF,6+ Mos 11/05/2014, 10/26/2015, 01/13/2017, 01/25/2018, 11/07/2018, 11/05/2020   Influenza-Unspecified 10/15/2013   MMR 11/10/2013   Pneumococcal-Unspecified 11/15/2010   Tdap 10/08/2013    TDAP status: Up to date  Flu Vaccine status: Completed at today's visit  Pneumococcal vaccine status: Up to date Not necessary for patient's age.   Covid-19 vaccine status: Declined, Education has been provided regarding the importance of this vaccine but patient still declined. Advised may receive this vaccine at local pharmacy  or Health Dept.or vaccine clinic. Aware to provide a copy of the vaccination record if obtained from local pharmacy or Health Dept. Verbalized acceptance and understanding.  Qualifies for Shingles Vaccine? No   Zostavax completed No   Shingrix Completed?: No.    Education has been provided regarding the importance of this vaccine. Patient has been advised to call insurance company to determine out of pocket expense if they have not yet received this vaccine. Advised may also receive vaccine at local pharmacy or Health Dept. Verbalized acceptance and understanding. Not due until age of 21.  Screening Tests Health Maintenance  Topic Date Due   COVID-19 Vaccine (1) Never done   Hepatitis C Screening  Never done   MAMMOGRAM  09/06/2016   PAP SMEAR-Modifier  08/31/2019   TETANUS/TDAP  10/09/2023   INFLUENZA VACCINE  Completed   HIV Screening  Completed   HPV VACCINES  Aged Out    Health Maintenance  Health Maintenance Due  Topic Date Due   COVID-19 Vaccine (1) Never done   Hepatitis C Screening  Never done   MAMMOGRAM  09/06/2016  PAP SMEAR-Modifier  08/31/2019    Colonoscopy: Not required due to patient age, no family hx of first degree relative with colorectal cancer. Will start screening at age of 13.  Mammogram status: Ordered 11/05/2020. Pt provided with contact info and advised to call to schedule appt.   Bone density: Not required due to patient age.   Lung Cancer Screening: (Low Dose CT Chest recommended if Age 80-80 years, 30 pack-year currently smoking OR have quit w/in 15years.) does not qualify.   Lung Cancer Screening Referral: n/a  Additional Screening:  Hepatitis C Screening: does qualify; Completed 11/05/20  Vision Screening: Recommended annual ophthalmology exams for early detection of glaucoma and other disorders of the eye. Is the patient up to date with their annual eye exam?  No  Who is the provider or what is the name of the office in which the patient  attends annual eye exams? no If pt is not established with a provider, would they like to be referred to a provider to establish care? No .   Dental Screening: Recommended annual dental exams for proper oral hygiene  Community Resource Referral / Chronic Care Management: CRR required this visit?  No   CCM required this visit?  No      Plan:  -Will obtain labs. -Continue to follow up with pain management. -Patient has samples of Ubrelvy for migraines from pain management. -Follow up in 3 months for migraine, GERD  I have personally reviewed and noted the following in the patient's chart:   Medical and social history Use of alcohol, tobacco or illicit drugs  Current medications and supplements including opioid prescriptions. Patient is currently taking opioid prescriptions. Information provided to patient regarding non-opioid alternatives. Patient advised to discuss non-opioid treatment plan with their provider. Functional ability and status Nutritional status Physical activity Advanced directives List of other physicians Hospitalizations, surgeries, and ER visits in previous 12 months Vitals Screenings to include cognitive, depression, and falls Referrals and appointments  In addition, I have reviewed and discussed with patient certain preventive protocols, quality metrics, and best practice recommendations. A written personalized care plan for preventive services as well as general preventive health recommendations were provided to patient.     Lorrene Reid, PA-C   11/05/2020

## 2020-11-05 NOTE — Patient Instructions (Signed)
Preventive Care 40-42 Years Old, Female Preventive care refers to lifestyle choices and visits with your health care provider that can promote health and wellness. This includes: A yearly physical exam. This is also called an annual wellness visit. Regular dental and eye exams. Immunizations. Screening for certain conditions. Healthy lifestyle choices, such as: Eating a healthy diet. Getting regular exercise. Not using drugs or products that contain nicotine and tobacco. Limiting alcohol use. What can I expect for my preventive care visit? Physical exam Your health care provider will check your: Height and weight. These may be used to calculate your BMI (body mass index). BMI is a measurement that tells if you are at a healthy weight. Heart rate and blood pressure. Body temperature. Skin for abnormal spots. Counseling Your health care provider may ask you questions about your: Past medical problems. Family's medical history. Alcohol, tobacco, and drug use. Emotional well-being. Home life and relationship well-being. Sexual activity. Diet, exercise, and sleep habits. Work and work environment. Access to firearms. Method of birth control. Menstrual cycle. Pregnancy history. What immunizations do I need? Vaccines are usually given at various ages, according to a schedule. Your health care provider will recommend vaccines for you based on your age, medical history, and lifestyle or other factors, such as travel or where you work. What tests do I need? Blood tests Lipid and cholesterol levels. These may be checked every 5 years, or more often if you are over 50 years old. Hepatitis C test. Hepatitis B test. Screening Lung cancer screening. You may have this screening every year starting at age 55 if you have a 30-pack-year history of smoking and currently smoke or have quit within the past 15 years. Colorectal cancer screening. All adults should have this screening starting at  age 50 and continuing until age 75. Your health care provider may recommend screening at age 45 if you are at increased risk. You will have tests every 1-10 years, depending on your results and the type of screening test. Diabetes screening. This is done by checking your blood sugar (glucose) after you have not eaten for a while (fasting). You may have this done every 1-3 years. Mammogram. This may be done every 1-2 years. Talk with your health care provider about when you should start having regular mammograms. This may depend on whether you have a family history of breast cancer. BRCA-related cancer screening. This may be done if you have a family history of breast, ovarian, tubal, or peritoneal cancers. Pelvic exam and Pap test. This may be done every 3 years starting at age 21. Starting at age 30, this may be done every 5 years if you have a Pap test in combination with an HPV test. Other tests STD (sexually transmitted disease) testing, if you are at risk. Bone density scan. This is done to screen for osteoporosis. You may have this scan if you are at high risk for osteoporosis. Talk with your health care provider about your test results, treatment options, and if necessary, the need for more tests. Follow these instructions at home: Eating and drinking  Eat a diet that includes fresh fruits and vegetables, whole grains, lean protein, and low-fat dairy products. Take vitamin and mineral supplements as recommended by your health care provider. Do not drink alcohol if: Your health care provider tells you not to drink. You are pregnant, may be pregnant, or are planning to become pregnant. If you drink alcohol: Limit how much you have to 0-1 drink a day. Be   aware of how much alcohol is in your drink. In the [place] one drink equals one 12 oz bottle of beer (355 mL), one 5 oz glass of wine (148 mL), or one 1 oz glass of hard liquor (44 mL). Lifestyle Take daily care of your teeth and  gums. Brush your teeth every morning and night with fluoride toothpaste. Floss one time each day. Stay active. Exercise for at least 30 minutes 5 or more days each week. Do not use any products that contain nicotine or tobacco, such as cigarettes, e-cigarettes, and chewing tobacco. If you need help quitting, ask your health care provider. Do not use drugs. If you are sexually active, practice safe sex. Use a condom or other form of protection to prevent STIs (sexually transmitted infections). If you do not wish to become pregnant, use a form of birth control. If you plan to become pregnant, see your health care provider for a prepregnancy visit. If told by your health care provider, take low-dose aspirin daily starting at age 63. Find healthy ways to cope with stress, such as: Meditation, yoga, or listening to music. Journaling. Talking to a trusted person. Spending time with friends and family. Safety Always wear your seat belt while driving or riding in a vehicle. Do not drive: If you have been drinking alcohol. Do not ride with someone who has been drinking. When you are tired or distracted. While texting. Wear a helmet and other protective equipment during sports activities. If you have firearms in your house, make sure you follow all gun safety procedures. What's next? Visit your health care provider once a year for an annual wellness visit. Ask your health care provider how often you should have your eyes and teeth checked. Stay up to date on all vaccines. This information is not intended to replace advice given to you by your health care provider. Make sure you discuss any questions you have with your health care provider. Document Revised: 04/10/2020 Document Reviewed: 10/12/2017 Elsevier Patient Education  2022 [employer].

## 2020-11-06 LAB — CBC WITH DIFFERENTIAL/PLATELET
Basophils Absolute: 0 10*3/uL (ref 0.0–0.2)
Basos: 0 %
EOS (ABSOLUTE): 0.3 10*3/uL (ref 0.0–0.4)
Eos: 6 %
Hematocrit: 43.3 % (ref 34.0–46.6)
Hemoglobin: 14.8 g/dL (ref 11.1–15.9)
Immature Grans (Abs): 0 10*3/uL (ref 0.0–0.1)
Immature Granulocytes: 0 %
Lymphocytes Absolute: 1.4 10*3/uL (ref 0.7–3.1)
Lymphs: 25 %
MCH: 31.9 pg (ref 26.6–33.0)
MCHC: 34.2 g/dL (ref 31.5–35.7)
MCV: 93 fL (ref 79–97)
Monocytes Absolute: 0.3 10*3/uL (ref 0.1–0.9)
Monocytes: 5 %
Neutrophils Absolute: 3.4 10*3/uL (ref 1.4–7.0)
Neutrophils: 64 %
Platelets: 242 10*3/uL (ref 150–450)
RBC: 4.64 x10E6/uL (ref 3.77–5.28)
RDW: 11.7 % (ref 11.7–15.4)
WBC: 5.4 10*3/uL (ref 3.4–10.8)

## 2020-11-06 LAB — COMPREHENSIVE METABOLIC PANEL
ALT: 21 IU/L (ref 0–32)
AST: 21 IU/L (ref 0–40)
Albumin/Globulin Ratio: 2.5 — ABNORMAL HIGH (ref 1.2–2.2)
Albumin: 4.7 g/dL (ref 3.8–4.8)
Alkaline Phosphatase: 57 IU/L (ref 44–121)
BUN/Creatinine Ratio: 13 (ref 9–23)
BUN: 8 mg/dL (ref 6–24)
Bilirubin Total: 0.5 mg/dL (ref 0.0–1.2)
CO2: 22 mmol/L (ref 20–29)
Calcium: 9.6 mg/dL (ref 8.7–10.2)
Chloride: 103 mmol/L (ref 96–106)
Creatinine, Ser: 0.64 mg/dL (ref 0.57–1.00)
Globulin, Total: 1.9 g/dL (ref 1.5–4.5)
Glucose: 107 mg/dL — ABNORMAL HIGH (ref 65–99)
Potassium: 3.9 mmol/L (ref 3.5–5.2)
Sodium: 142 mmol/L (ref 134–144)
Total Protein: 6.6 g/dL (ref 6.0–8.5)
eGFR: 113 mL/min/{1.73_m2} (ref >59)

## 2020-11-06 LAB — HEMOGLOBIN A1C
Est. average glucose Bld gHb Est-mCnc: 111 mg/dL
Hgb A1c MFr Bld: 5.5 % (ref 4.8–5.6)

## 2020-11-06 LAB — TSH: TSH: 0.818 u[IU]/mL (ref 0.450–4.500)

## 2020-11-06 LAB — VITAMIN D 25 HYDROXY (VIT D DEFICIENCY, FRACTURES): Vit D, 25-Hydroxy: 26.6 ng/mL — ABNORMAL LOW (ref 30.0–100.0)

## 2020-11-06 LAB — HEPATITIS C ANTIBODY: Hep C Virus Ab: <0.1 s/co ratio (ref 0.0–0.9)

## 2020-11-06 LAB — LDL CHOLESTEROL, DIRECT: LDL Direct: 131 mg/dL — ABNORMAL HIGH (ref 0–99)

## 2021-01-14 ENCOUNTER — Inpatient Hospital Stay: Admission: RE | Admit: 2021-01-14 | Payer: Medicare HMO | Source: Ambulatory Visit

## 2021-02-01 ENCOUNTER — Ambulatory Visit: Payer: Medicare HMO | Admitting: Physician Assistant

## 2021-02-09 ENCOUNTER — Ambulatory Visit: Payer: Medicare HMO | Admitting: Physician Assistant

## 2021-02-10 ENCOUNTER — Encounter: Payer: Self-pay | Admitting: Physician Assistant

## 2021-02-10 ENCOUNTER — Ambulatory Visit (INDEPENDENT_AMBULATORY_CARE_PROVIDER_SITE_OTHER): Payer: Medicare HMO | Admitting: Physician Assistant

## 2021-02-10 ENCOUNTER — Other Ambulatory Visit: Payer: Self-pay

## 2021-02-10 VITALS — BP 118/80 | HR 82 | Temp 98.0°F | Ht 63.0 in | Wt 171.6 lb

## 2021-02-10 DIAGNOSIS — G43809 Other migraine, not intractable, without status migrainosus: Secondary | ICD-10-CM | POA: Diagnosis not present

## 2021-02-10 DIAGNOSIS — J4540 Moderate persistent asthma, uncomplicated: Secondary | ICD-10-CM | POA: Diagnosis not present

## 2021-02-10 DIAGNOSIS — E785 Hyperlipidemia, unspecified: Secondary | ICD-10-CM

## 2021-02-10 DIAGNOSIS — K219 Gastro-esophageal reflux disease without esophagitis: Secondary | ICD-10-CM

## 2021-02-10 DIAGNOSIS — B351 Tinea unguium: Secondary | ICD-10-CM

## 2021-02-10 MED ORDER — CICLOPIROX 8 % EX SOLN: Freq: Every day | CUTANEOUS | 0 refills | Status: DC

## 2021-02-10 MED ORDER — MOMETASONE FURO-FORMOTEROL FUM 100-5 MCG/ACT IN AERO: 2.0000 | INHALATION_SPRAY | Freq: Two times a day (BID) | RESPIRATORY_TRACT | 1 refills | Status: DC

## 2021-02-10 MED ORDER — VENTOLIN HFA 108 (90 BASE) MCG/ACT IN AERS: 2.0000 | INHALATION_SPRAY | Freq: Four times a day (QID) | RESPIRATORY_TRACT | 2 refills | Status: DC | PRN

## 2021-02-10 NOTE — Assessment & Plan Note (Signed)
-  Stable. -Continue current medication regimen. -Will monitor.

## 2021-02-10 NOTE — Patient Instructions (Signed)
Asthma, Adult Asthma is a long-term (chronic) condition that causes recurrent episodes in which the airways become tight and narrow. The airways are the passages that lead from the nose and mouth down into the lungs. Asthma episodes, also called asthma attacks, can cause coughing, wheezing, shortness of breath, and chest pain. The airways can also fill with mucus. During an attack, it can be difficult to breathe. Asthma attacks can range from minor to life threatening. Asthma cannot be cured, but medicines and lifestyle changes can help control it and treat acute attacks. What are the causes? This condition is believed to be caused by inherited (genetic) and environmental factors, but its exact cause is not known. There are many things that can bring on an asthma attack or make asthma symptoms worse (triggers). Asthma triggers are different for each person. Common triggers include: Mold. Dust. Cigarette smoke. Cockroaches. Things that can cause allergy symptoms (allergens), such as animal dander or pollen from trees or grass. Air pollutants such as household cleaners, wood smoke, smog, or Advertising account planner. Cold air, weather changes, and winds (which increase molds and pollen in the air). Strong emotional expressions such as crying or laughing hard. Stress. Certain medicines (such as aspirin) or types of medicines (such as beta-blockers). Sulfites in foods and drinks. Foods and drinks that may contain sulfites include dried fruit, potato chips, and sparkling grape juice. Infections or inflammatory conditions such as the flu, a cold, or inflammation of the nasal membranes (rhinitis). Gastroesophageal reflux disease (GERD). Exercise or strenuous activity. What are the signs or symptoms? Symptoms of this condition may occur right after asthma is triggered or many hours later. Symptoms include: Wheezing. This can sound like whistling when you breathe. Excessive nighttime or early morning  coughing. Frequent or severe coughing with a common cold. Chest tightness. Shortness of breath. Tiredness (fatigue) with minimal activity. How is this diagnosed? This condition is diagnosed based on: Your medical history. A physical exam. Tests, which may include: Lung function studies and pulmonary studies (spirometry). These tests can evaluate the flow of air in your lungs. Allergy tests. Imaging tests, such as X-rays. How is this treated? There is no cure for this condition, but treatment can help control your symptoms. Treatment for asthma usually involves: Identifying and avoiding your asthma triggers. Using medicines to control your symptoms. Generally, two types of medicines are used to treat asthma: Controller medicines. These help prevent asthma symptoms from occurring. They are usually taken every day. Fast-acting reliever or rescue medicines. These quickly relieve asthma symptoms by widening the narrow and tight airways. They are used as needed and provide short-term relief. Using supplemental oxygen. This may be needed during a severe episode. Using other medicines, such as: Allergy medicines, such as antihistamines, if your asthma attacks are triggered by allergens. Immune medicines (immunomodulators). These are medicines that help control the immune system. Creating an asthma action plan. An asthma action plan is a written plan for managing and treating your asthma attacks. This plan includes: A list of your asthma triggers and how to avoid them. Information about when medicines should be taken and when their dosage should be changed. Instructions about using a device called a peak flow meter. A peak flow meter measures how well the lungs are working and the severity of your asthma. It helps you monitor your condition. Follow these instructions at home: Controlling your home environment Control your home environment in the following ways to help avoid triggers and prevent  asthma attacks: Change your heating  and air conditioning filter regularly. °Limit your use of fireplaces and wood stoves. °Get rid of pests (such as roaches and mice) and their droppings. °Throw away plants if you see mold on them. °Clean floors and dust surfaces regularly. Use unscented cleaning products. °Try to have someone else vacuum for you regularly. Stay out of rooms while they are being vacuumed and for a short while afterward. If you vacuum, use a dust mask from a hardware store, a double-layered or microfilter vacuum cleaner bag, or a vacuum cleaner with a HEPA filter. °Replace carpet with wood, tile, or vinyl flooring. Carpet can trap dander and dust. °Use allergy-proof pillows, mattress covers, and box spring covers. °Keep your bedroom a trigger-free room. °Avoid pets and keep windows closed when allergens are in the air. °Wash beddings every week in hot water and dry them in a dryer. °Use blankets that are made of polyester or cotton. °Clean bathrooms and kitchens with bleach. If possible, have someone repaint the walls in these rooms with mold-resistant paint. Stay out of the rooms that are being cleaned and painted. °Wash your hands often with soap and water. If soap and water are not available, use hand sanitizer. °Do not allow anyone to smoke in your home. °General instructions °Take over-the-counter and prescription medicines only as told by your health care provider. °Speak with your health care provider if you have questions about how or when to take the medicines. °Make note if you are requiring more frequent dosages. °Do not use any products that contain nicotine or tobacco, such as cigarettes and e-cigarettes. If you need help quitting, ask your health care provider. Also, avoid being exposed to secondhand smoke. °Use a peak flow meter as told by your health care provider. Record and keep track of the readings. °Understand and use the asthma action plan to help minimize, or stop an asthma  attack, without needing to seek medical care. °Make sure you stay up to date on your yearly vaccinations as told by your health care provider. This may include vaccines for the flu and pneumonia. °Avoid outdoor activities when allergen counts are high and when air quality is low. °Wear a ski mask that covers your nose and mouth during outdoor winter activities. Exercise indoors on cold days if you can. °Warm up before exercising, and take time for a cool-down period after exercise. °Keep all follow-up visits as told by your health care provider. This is important. °Where to find more information °For information about asthma, turn to the Centers for Disease Control and Prevention at www.cdc.gov/asthma/faqs °For air quality information, turn to AirNow at airnow.gov °Contact a health care provider if: °You have wheezing, shortness of breath, or a cough even while you are taking medicine to prevent attacks. °The mucus you cough up (sputum) is thicker than usual. °Your sputum changes from clear or white to yellow, green, gray, or bloody. °Your medicines are causing side effects, such as a rash, itching, swelling, or trouble breathing. °You need to use a reliever medicine more than 2-3 times a week. °Your peak flow reading is still at 50-79% of your personal best after following your action plan for 1 hour. °You have a fever. °Get help right away if: °You are getting worse and do not respond to treatment during an asthma attack. °You are short of breath when at rest or when doing very little physical activity. °You have difficulty eating, drinking, or talking. °You have chest pain or tightness. °You develop a fast heartbeat or   palpitations. You have a bluish color to your lips or fingernails. You are light-headed or dizzy, or you faint. Your peak flow reading is less than 50% of your personal best. You feel too tired to breathe normally. Summary Asthma is a long-term (chronic) condition that causes recurrent  episodes in which the airways become tight and narrow. These episodes can cause coughing, wheezing, shortness of breath, and chest pain. Asthma cannot be cured, but medicines and lifestyle changes can help control it and treat acute attacks. Make sure you understand how to avoid triggers and how and when to use your medicines. Asthma attacks can range from minor to life threatening. Get help right away if you have an asthma attack and do not respond to treatment with your usual rescue medicines. This information is not intended to replace advice given to you by your health care provider. Make sure you discuss any questions you have with your health care provider. Document Revised: 11/01/2019 Document Reviewed: 06/05/2019 Elsevier Patient Education  2022 [employer].

## 2021-02-10 NOTE — Assessment & Plan Note (Signed)
-  Patient tried and failed Ubrelvy. Discussed alternative abortive therapies and wants to trial Nurtec 75 mg. Provided sample. Advised to let me know if medication effective and will send rx. Recommend to avoid potential provocative headache triggers. -Will continue to monitor.

## 2021-02-10 NOTE — Progress Notes (Signed)
Established Patient Office Visit  Subjective:  Patient ID: [name], female    DOB: 02-Oct-1980  Age: 40 y.o. MRN: [id number]  CC:  Chief Complaint  Patient presents with   Follow-up   Asthma    HPI [name] presents for follow up on asthma, GERD and hyperlipidemia.  Asthma: Patient reports using ventolin inhaler three times per day almost everyday. Reports has used inhaler at nighttime once or twice per month. Does not use Symbicort inhaler daily because it does not help much with her breathing. Does report wheezing and some shortness of breath. Reports taking Claritin daily which she has been on for several years.  GERD: Taking medication daily. States symptoms have been stable. No recent heartburn exacerbation.   Migraine: Patient reports Roselyn Meier was ineffective. States pain management increased the dose but she was not able to tolerate higher dose due to dizziness. Patient reports Imitrex is not very effective with acute headaches. States she has one headache a week and has to lay down in a quiet dark room to help resolve headache. Ice packs sometimes help.   HLD: Pt trying to manage with diet. Patient's husband is trying to lose weight so has made diet changes by eating more greens and vegetables.    Past Medical History:  Diagnosis Date   Anxiety    no meds   Asthma    "very badly" (01/17/2012)   Cervical cancer ([place]) 1999; 2000   CHF (congestive heart failure) ([place]) 2006   Resolved -History"with 2006 pregnancy, toxcemia "  not seeing a cardiologist now.   Chronic back pain    Chronic bronchitis ([place])    "@ least once/yr" (01/17/2012) - none since 01/4579   Complication of anesthesia    back surgeries x 6 - fusions T8-S2- rods   Degenerative disk disease    Difficult intubation    "needs small tube"   Family history of anesthesia complication    Nausea   GERD (gastroesophageal reflux disease)    Migraines    last migraine 09/2013    Neuromuscular disorder ([place])    sciatica  - affecting left leg numb/pain   Pneumonia    "~ once/yr" (01/17/2012) none since 01/2012   PONV (postoperative nausea and vomiting)    Respiratory failure ([place]) 11/2010; 09/2011   Resolved  - History pneumonia    Past Surgical History:  Procedure Laterality Date   ADENOIDECTOMY, TONSILLECTOMY AND [place]?   ANTERIOR LUMBAR FUSION  01/17/2012   Procedure: ANTERIOR LUMBAR FUSION 2 LEVELS;  Surgeon: Lowella Grip, MD;  Location: [facility];  Service: Orthopedics;  Laterality: Bilateral;  Anterior Lumbar 3-4, Lumbar 4-5 fusion with bone graft harvest   ANTERIOR LUMBAR FUSION     BACK SURGERY  02/19/2014   CERVICAL CONE BIOPSY  1999; 2000   cervical cancer   CESAREAN SECTION N/A 11/08/2013   Procedure: REPEAT CESAREAN SECTION;  Surgeon: Shelly Bombard, MD;  Location: [facility];  Service: Obstetrics;  Laterality: N/A;   KNEE ARTHROSCOPY  2004 X 2   "left X 2" (01/17/2012)   LUMBAR DISC SURGERY  06/1998   "L4-5" (01/17/2012)   LUMBAR FUSION  08/1998; 2010; 2012; 06/2011, 02/19/14   "fusion cage; put rods in; reanchored screws; rod broke" (01/17/2012); Had son in 10/2013 and rods in back broke.    SPINAL FUSION  10/04/2011   Procedure: FUSION POSTERIOR SPINAL MULTILEVEL;  Surgeon: Lowella Grip, MD;  Location: [facility];  Service: Orthopedics;  Laterality: Bilateral;  repair lumbar pseudoarthritis, revision segmental fixation, L3-L4, L4-L5   SPINAL FUSION  07/06/2015   T7 - S2   TONSILLECTOMY      Family History  Problem Relation Age of Onset   COPD Mother    Breast cancer Mother    Heart disease Father 43   Hypertension Father    Diabetes Father        ESRD on dialysis   Deep vein thrombosis Father    Lung cancer Father    Cancer Other    Heart attack Sister 4       Died with MI   Drug abuse Brother    Depression Brother    Hyperlipidemia Brother    Hypertension Brother     Social History   Socioeconomic  History   Marital status: Married    Spouse name: Not on file   Number of children: 1   Years of education: Not on file   Highest education level: Not on file  Occupational History   Occupation: Disabled  Tobacco Use   Smoking status: Former    Years: 15.00    Types: Cigarettes    Quit date: 02/15/2019    Years since quitting: 1.9   Smokeless tobacco: Never   Tobacco comments:    1/4-1/2 PPD  Vaping Use   Vaping Use: Never used  Substance and Sexual Activity   Alcohol use: No   Drug use: No   Sexual activity: Yes    Partners: Male    Birth control/protection: I.U.D.  Other Topics Concern   Not on file  Social History Narrative   Married, 2 children   disabled   Social Determinants of Health   Financial Resource Strain: Not on file  Food Insecurity: Not on file  Transportation Needs: Not on file  Physical Activity: Not on file  Stress: Not on file  Social Connections: Not on file  Intimate Partner Violence: Not on file    Outpatient Medications Prior to Visit  Medication Sig Dispense Refill   budesonide-formoterol (SYMBICORT) 80-4.5 MCG/ACT inhaler Inhale 2 puffs into the lungs 2 (two) times daily. 1 Inhaler 3   Cholecalciferol (VITAMIN D PO) Take 500 mg by mouth 2 (two) times daily.     DIAZEPAM PO Take 10 mg by mouth daily as needed (for muscle pain---patient reported).     loratadine (CLARITIN) 10 MG tablet TAKE 1 TABLET BY MOUTH ONCE DAILY 30 tablet 10   omeprazole (PRILOSEC) 40 MG capsule Take 1 capsule (40 mg total) by mouth daily. 90 capsule 1   ondansetron (ZOFRAN) 4 MG tablet Take 1 tablet (4 mg total) by mouth every 8 (eight) hours as needed for nausea or vomiting. 20 tablet 0   oxyCODONE (ROXICODONE) 15 MG immediate release tablet Take 15 mg by mouth every 4 (four) hours as needed for pain.     SUMAtriptan (IMITREX) 50 MG tablet Take 1 tablet (50 mg total) by mouth every 2 (two) hours as needed for migraine. May repeat in 2 hours if headache persists or  recurs. 10 tablet 5   ciclopirox (PENLAC) 8 % solution Apply topically at bedtime. Apply over nail and surrounding skin. Apply daily over previous coat. After seven (7) days, may remove with alcohol and continue cycle. 6.6 mL 0   VENTOLIN HFA 108 (90 Base) MCG/ACT inhaler INHALE 2 PUFFS INTO THE LUNGS EVERY 6 HOURS AS NEEDED FOR WHEEZING OR SHORTNESS OF BREATH. 18 g 5   No  facility-administered medications prior to visit.    Allergies  Allergen Reactions   Iodine Itching and Rash    Pt states she has used iodine and has had no reaction, she is allergic to steri strips. 08-30-16   Other Rash    Compound Tincture of Benzoin     ROS Review of Systems Review of Systems:  A fourteen system review of systems was performed and found to be positive as per HPI.   Objective:    Physical Exam General:  Well Developed, well nourished, appropriate for stated age.  Neuro:  Alert and oriented,  extra-ocular muscles intact  HEENT:  Normocephalic, atraumatic, neck supple Skin:  no gross rash, warm, pink. Cardiac:  RRR, S1 S2 Respiratory:  +wheezing and rhonchi, no crackles or rales. No respiratory distress.  Vascular:  Ext warm, no cyanosis apprec.; cap RF less 2 sec. Psych:  No HI/SI, judgement and insight good, Euthymic mood. Full Affect.  BP 118/80    Pulse 82    Temp 98 F (36.7 C)    Ht 5' 3"  (1.6 m)    Wt 171 lb 9.3 oz (77.8 kg)    SpO2 98%    BMI 30.39 kg/m  Wt Readings from Last 3 Encounters:  02/10/21 171 lb 9.3 oz (77.8 kg)  11/05/20 168 lb 3.2 oz (76.3 kg)  11/27/19 181 lb 14.4 oz (82.5 kg)     Health Maintenance Due  Topic Date Due   COVID-19 Vaccine (1) Never done   Pneumococcal Vaccine 31-39 Years old (1 - PCV) 02/26/1984   MAMMOGRAM  09/06/2016   PAP SMEAR-Modifier  08/31/2019    There are no preventive care reminders to display for this patient.  Lab Results  Component Value Date   TSH 0.818 11/05/2020   Lab Results  Component Value Date   WBC 5.4 11/05/2020    HGB 14.8 11/05/2020   HCT 43.3 11/05/2020   MCV 93 11/05/2020   PLT 242 11/05/2020   Lab Results  Component Value Date   NA 142 11/05/2020   K 3.9 11/05/2020   CO2 22 11/05/2020   GLUCOSE 107 (H) 11/05/2020   BUN 8 11/05/2020   CREATININE 0.64 11/05/2020   BILITOT 0.5 11/05/2020   ALKPHOS 57 11/05/2020   AST 21 11/05/2020   ALT 21 11/05/2020   PROT 6.6 11/05/2020   ALBUMIN 4.7 11/05/2020   CALCIUM 9.6 11/05/2020   ANIONGAP 11 07/12/2016   EGFR 113 11/05/2020   GFR 73.00 05/21/2018   Lab Results  Component Value Date   CHOL 184 11/05/2014   Lab Results  Component Value Date   HDL 41.00 11/05/2014   Lab Results  Component Value Date   LDLCALC 125 (H) 11/05/2014   Lab Results  Component Value Date   TRIG 89.0 11/05/2014   Lab Results  Component Value Date   CHOLHDL 4 11/05/2014   Lab Results  Component Value Date   HGBA1C 5.5 11/05/2020      Assessment & Plan:   Problem List Items Addressed This Visit       Cardiovascular and Mediastinum   Migraines    -Patient tried and failed Ubrelvy. Discussed alternative abortive therapies and wants to trial Nurtec 75 mg. Provided sample. Advised to let me know if medication effective and will send rx. Recommend to avoid potential provocative headache triggers. -Will continue to monitor.         Respiratory   Asthma, chronic - Primary    -Patient has mild adventitious  sounds on exam with history of frequent use of SABA so discussed treatment adjustments by starting a different ICS-LABA such as Dulera. Patient is agreeable.  Oxygen saturation 98% on RA.  Recommend changing oral antihistamine to Zyrtec, Allegra or Xyzal.  Will continue to monitor.      Relevant Medications   VENTOLIN HFA 108 (90 Base) MCG/ACT inhaler   mometasone-formoterol (DULERA) 100-5 MCG/ACT AERO     Digestive   GERD without esophagitis    -Stable. -Continue current medication regimen. -Will monitor.      Other Visit Diagnoses      Hyperlipidemia, unspecified hyperlipidemia type       Onychomycosis       Relevant Medications   ciclopirox (PENLAC) 8 % solution      Hyperlipidemia: -Direct LDL 11/05/2020 elevated at 131. Recommend to continue with dietary changes including reduction of saturated fats.  Will continue to monitor and recommend obtaining a fasting lipid panel at follow-up visit.  Meds ordered this encounter  Medications   VENTOLIN HFA 108 (90 Base) MCG/ACT inhaler    Sig: Inhale 2 puffs into the lungs every 6 (six) hours as needed for wheezing or shortness of breath.    Dispense:  18 g    Refill:  2   mometasone-formoterol (DULERA) 100-5 MCG/ACT AERO    Sig: Inhale 2 puffs into the lungs 2 (two) times daily.    Dispense:  13 g    Refill:  1    Order Specific Question:   Supervising Provider    Answer:   Beatrice Lecher D [[id number]]   ciclopirox (PENLAC) 8 % solution    Sig: Apply topically at bedtime. Apply over nail and surrounding skin. Apply daily over previous coat. After seven (7) days, may remove with alcohol and continue cycle.    Dispense:  6.6 mL    Refill:  0    Order Specific Question:   Supervising Provider    Answer:   Beatrice Lecher D [[id number]]    Follow-up: Return in about 4 months (around 06/11/2021) for asthma, migraine.   Note:  This note was prepared with assistance of Dragon voice recognition software. Occasional wrong-word or sound-a-like substitutions may have occurred due to the inherent limitations of voice recognition software.  Lorrene Reid, PA-C

## 2021-02-10 NOTE — Assessment & Plan Note (Signed)
-  Patient has mild adventitious sounds on exam with history of frequent use of SABA so discussed treatment adjustments by starting a different ICS-LABA such as Dulera. Patient is agreeable.  Oxygen saturation 98% on RA.  Recommend changing oral antihistamine to Zyrtec, Allegra or Xyzal.  Will continue to monitor.

## 2021-06-10 ENCOUNTER — Other Ambulatory Visit: Payer: Self-pay | Admitting: Physician Assistant

## 2021-06-10 ENCOUNTER — Encounter: Payer: Self-pay | Admitting: Physician Assistant

## 2021-06-10 ENCOUNTER — Telehealth (INDEPENDENT_AMBULATORY_CARE_PROVIDER_SITE_OTHER): Payer: Medicare HMO | Admitting: Physician Assistant

## 2021-06-10 VITALS — Ht 63.0 in | Wt 165.0 lb

## 2021-06-10 DIAGNOSIS — J4540 Moderate persistent asthma, uncomplicated: Secondary | ICD-10-CM | POA: Diagnosis not present

## 2021-06-10 DIAGNOSIS — K219 Gastro-esophageal reflux disease without esophagitis: Secondary | ICD-10-CM

## 2021-06-10 DIAGNOSIS — G43809 Other migraine, not intractable, without status migrainosus: Secondary | ICD-10-CM

## 2021-06-10 MED ORDER — MOMETASONE FURO-FORMOTEROL FUM 100-5 MCG/ACT IN AERO: 2.0000 | INHALATION_SPRAY | Freq: Two times a day (BID) | RESPIRATORY_TRACT | 1 refills | Status: DC

## 2021-06-10 MED ORDER — VENTOLIN HFA 108 (90 BASE) MCG/ACT IN AERS: 2.0000 | INHALATION_SPRAY | Freq: Four times a day (QID) | RESPIRATORY_TRACT | 2 refills | Status: DC | PRN

## 2021-06-10 MED ORDER — OMEPRAZOLE 40 MG PO CPDR: 40.0000 mg | DELAYED_RELEASE_CAPSULE | Freq: Every day | ORAL | 1 refills | Status: DC

## 2021-06-10 MED ORDER — NURTEC 75 MG PO TBDP: ORAL_TABLET | ORAL | 1 refills | Status: DC

## 2021-06-10 NOTE — Progress Notes (Signed)
? ?  ? ? ?Telehealth office visit note for Crystal Reid, PA-C- at Primary Care at [facility] ?  ?I connected with current patient today by telephone and verified that I am speaking with the correct person   ? Location of the patient: Home ? Location of the provider: Office ?- This visit type was conducted due to national recommendations for restrictions regarding the COVID-19 Pandemic (e.g. social distancing) in an effort to limit this patient's exposure and mitigate transmission in our community.    ?- No physical exam could be performed with this format, beyond that communicated to [place] by the patient/ family members as noted.   ?- Additionally my office staff/ schedulers were to discuss with the patient that there may be a monetary charge related to this service, depending on their medical insurance.  My understanding is that patient understood and consented to proceed.   ?  ?_________________________________________________________________________________ ? ? ?History of Present Illness: ?Patient calls in to follow-up on asthma and migraine.  Patient reports was not able to start [employer] because of a pharmacy issue.  Has been using only Ventolin inhaler daily and currently due to allergy season using the inhaler 2-3 times per day.  Overall her migraines have been okay.  Currently has 2 to 3 per month and are not as severe.  Currently has a migraine that started yesterday.  Took Imitrex 50 mg which did not help.  Was previously given Nurtec samples which she took for an acute headache and worked better than Imitrex.  Previously has also tried [place] which was ineffective.  Continues to take omeprazole 40 mg daily.  Will have a flareup if he eats something spicy and otherwise has no issues. ? ? ? ? ? ?  02/10/2021  ?  9:58 AM 11/05/2020  ?  1:26 PM  ?GAD 7 : Generalized Anxiety Score  ?Nervous, Anxious, on Edge 0 0  ?Control/stop worrying 0 0  ?Worry too much - different things 0 1  ?Trouble relaxing 1 0   ?Restless 0 0  ?Easily annoyed or irritable 1 0  ?Afraid - awful might happen 0 0  ?Total GAD 7 Score 2 1  ?Anxiety Difficulty Not difficult at all   ? ? ? ?  06/10/2021  ?  9:38 AM 02/10/2021  ?  9:57 AM 11/05/2020  ?  1:25 PM 11/27/2019  ?  3:52 PM 07/26/2017  ?  9:57 AM  ?Depression screen PHQ 2/9  ?Decreased Interest 0 0 0 0 1  ?Down, Depressed, Hopeless 0 0 0 0 1  ?PHQ - 2 Score 0 0 0 0 2  ?Altered sleeping 0 0 1 0 2  ?Tired, decreased energy 0 1 1 0 2  ?Change in appetite 0 1 1 0 0  ?Feeling bad or failure about yourself  0 0 0 0 0  ?Trouble concentrating 0 0 0 0 2  ?Moving slowly or fidgety/restless 0 0 0 0 2  ?Suicidal thoughts 0 0 0 0 1  ?PHQ-9 Score 0 2 3 0 11  ?Difficult doing work/chores  Not difficult at all Somewhat difficult    ? ? ? ? ?Impression and Recommendations:   ? ? ?1. Moderate persistent chronic asthma without complication   ?2. GERD without esophagitis   ?3. Other migraine without status migrainosus, not intractable   ?  ?Moderate persistent chronic asthma without complication: ?-Patient previously was unable to tolerate Symbicort and was agreeable to trying [facility].  Will resend prescription.  Continue Proventil  inhaler as needed.  ? ?GERD without esophagitis: ?-Stable. Recommend to avoid provocative foods. Continue omeprazole 40 mg daily.  ? ?Other migraine without status migrainosus, not intractable: ?-Patient has tried and failed Ubrelvy, Imitrex not working well to resolve acute headache. Will send rx for Nurtec 75 mg to take as needed for acute headache. Having less than 10 headaches per month.  ? ? ?- As part of my medical decision making, I reviewed the following data within the [place] History obtained from pt /family, CMA notes reviewed and incorporated if applicable, Labs reviewed, Radiograph/ tests reviewed if applicable and OV notes from prior OV's with me, as well as any other specialists she/he has seen since seeing me last, were all reviewed and used in my  medical decision making process today.   ? ?- Additionally, when appropriate, discussion had with patient regarding our treatment plan, and their biases/concerns about that plan were used in my medical decision making today.   ? ?- The patient agreed with the plan and demonstrated an understanding of the instructions.   No barriers to understanding were identified.  ?   ?- The patient was advised to call back or seek an in-person evaluation if the symptoms worsen or if the condition fails to improve as anticipated. ? ? ?Return in about 5 months (around 11/10/2021) for [place].  ? ? ?No orders of the defined types were placed in this encounter. ? ? ?Meds ordered this encounter  ?Medications  ? Rimegepant Sulfate (NURTEC) 75 MG TBDP  ?  Sig: Dissolve 1 tablet on tongue as needed for acute headache. Max dose 75 mg/day.  ?  Dispense:  15 tablet  ?  Refill:  1  ? mometasone-formoterol (DULERA) 100-5 MCG/ACT AERO  ?  Sig: Inhale 2 puffs into the lungs 2 (two) times daily.  ?  Dispense:  13 g  ?  Refill:  1  ?  Order Specific Question:   Supervising Provider  ?  Answer:   Beatrice Lecher D [[id number]]  ? omeprazole (PRILOSEC) 40 MG capsule  ?  Sig: Take 1 capsule (40 mg total) by mouth daily.  ?  Dispense:  90 capsule  ?  Refill:  1  ?  Order Specific Question:   Supervising Provider  ?  Answer:   Beatrice Lecher D [[id number]]  ? VENTOLIN HFA 108 (90 Base) MCG/ACT inhaler  ?  Sig: Inhale 2 puffs into the lungs every 6 (six) hours as needed for wheezing or shortness of breath.  ?  Dispense:  18 g  ?  Refill:  2  ?  Order Specific Question:   Supervising Provider  ?  Answer:   Beatrice Lecher D [[id number]]  ? ? ?Medications Discontinued During This Encounter  ?Medication Reason  ? omeprazole (PRILOSEC) 40 MG capsule Reorder  ? VENTOLIN HFA 108 (90 Base) MCG/ACT inhaler Reorder  ? mometasone-formoterol (DULERA) 100-5 MCG/ACT AERO Reorder  ?  ? ? ? ?Time spent on telephone encounter was 8 minutes. ? ?Note:  This note was  prepared with assistance of Dragon voice recognition software. Occasional wrong-word or sound-a-like substitutions may have occurred due to the inherent limitations of voice recognition software. ? ? ? ? ?The [place] was signed into law in 2016 which includes the topic of electronic health records.  This provides immediate access to information in MyChart.  This includes consultation notes, operative notes, office notes, lab results and pathology reports.  If you have  any questions about what you read please let us know at your next visit or call us at the office.  We are right here with you. ? ? ?__________________________________________________________________________________ ? ? ? ? ?Patient Care Team  ?  Relationship Specialty Notifications Start End  ?Crystal Reid, PA-C PCP - General Physician Assistant  11/27/19   ?Shelly Bombard, MD Consulting Physician Obstetrics and Gynecology  04/14/14   ?Atilano Ina, MD Consulting Physician Neurosurgery  01/25/16   ?[facility]  Pain Medicine  01/25/16   ? ? ? ?-Vitals obtained; medications/ allergies reconciled;  personal medical, social, Sx etc.histories were updated by CMA, reviewed by me and are reflected in chart ? ? ?Patient Active Problem List  ? Diagnosis Date Noted  ? Primary osteoarthritis of left knee 09/24/2019  ? Bilateral carpal tunnel syndrome 11/20/2018  ? Abdominal aortic aneurysm (AAA) without rupture ([place]) 08/20/2016  ? Chest pain in adult 08/20/2016  ? Plantar wart 06/17/2016  ? Pain of right hip joint 08/11/2015  ? Preventative health care 11/06/2014  ? Vitamin D deficiency 11/06/2014  ? Acquired leg length discrepancy 10/10/2014  ? Thoracic postlaminectomy syndrome 10/10/2014  ? Postlaminectomy syndrome, lumbar region 10/10/2014  ? Disorder of sacroiliac joint 10/10/2014  ? Depression 04/16/2014  ? Asthma, chronic 04/16/2014  ? History of cervical cancer 04/16/2014  ? Migraines 04/16/2014  ? CHF (congestive  heart failure) ([place]) 02/10/2014  ? Tobacco use 02/10/2014  ? Sagittal plane imbalance 12/29/2013  ? Scoliosis 03/27/2013  ? GERD without esophagitis 06/12/2012  ? Degeneration of intervertebral disc, site unspecif

## 2021-08-26 ENCOUNTER — Ambulatory Visit: Payer: Medicare HMO | Admitting: Nurse Practitioner

## 2021-08-31 ENCOUNTER — Ambulatory Visit: Payer: Medicare HMO | Admitting: Physician Assistant

## 2021-09-08 ENCOUNTER — Encounter: Payer: Self-pay | Admitting: Physician Assistant

## 2021-09-08 ENCOUNTER — Ambulatory Visit (INDEPENDENT_AMBULATORY_CARE_PROVIDER_SITE_OTHER): Payer: Medicare HMO | Admitting: Physician Assistant

## 2021-09-08 VITALS — BP 102/68 | HR 91 | Ht 63.0 in | Wt 164.0 lb

## 2021-09-08 DIAGNOSIS — M79672 Pain in left foot: Secondary | ICD-10-CM | POA: Diagnosis not present

## 2021-09-08 DIAGNOSIS — B351 Tinea unguium: Secondary | ICD-10-CM

## 2021-09-08 DIAGNOSIS — M79671 Pain in right foot: Secondary | ICD-10-CM | POA: Diagnosis not present

## 2021-09-08 MED ORDER — TERBINAFINE HCL 250 MG PO TABS: 250.0000 mg | ORAL_TABLET | Freq: Every day | ORAL | 0 refills | Status: DC

## 2021-09-08 NOTE — Patient Instructions (Signed)
Plantar Fasciitis Rehab Ask your health care provider which exercises are safe for you. Do exercises exactly as told by your health care provider and adjust them as directed. It is normal to feel mild stretching, pulling, tightness, or discomfort as you do these exercises. Stop right away if you feel sudden pain or your pain gets worse. Do not begin these exercises until told by your health care provider. Stretching and range-of-motion exercises These exercises warm up your muscles and joints and improve the movement and flexibility of your foot. These exercises also help to relieve pain. Plantar fascia stretch  Sit with your left / right leg crossed over your opposite knee. Hold your heel with one hand with that thumb near your arch. With your other hand, hold your toes and gently pull them back toward the top of your foot. You should feel a stretch on the base (bottom) of your toes, or the bottom of your foot (plantar fascia), or both. Hold this stretch for__________ seconds. Slowly release your toes and return to the starting position. Repeat __________ times. Complete this exercise __________ times a day. Gastrocnemius stretch, standing This exercise is also called a calf (gastroc) stretch. It stretches the muscles in the back of the upper calf. Stand with your hands against a wall. Extend your left / right leg behind you, and bend your front knee slightly. Keeping your heels on the floor, your toes facing forward, and your back knee straight, shift your weight toward the wall. Do not arch your back. You should feel a gentle stretch in your upper calf. Hold this position for __________ seconds. Repeat __________ times. Complete this exercise __________ times a day. Soleus stretch, standing This exercise is also called a calf (soleus) stretch. It stretches the muscles in the back of the lower calf. Stand with your hands against a wall. Extend your left / right leg behind you, and bend your  front knee slightly. Keeping your heels on the floor and your toes facing forward, bend your back knee and shift your weight slightly over your back leg. You should feel a gentle stretch deep in your lower calf. Hold this position for __________ seconds. Repeat __________ times. Complete this exercise __________ times a day. Gastroc and soleus stretch, standing step This exercise stretches the muscles in the back of the lower leg. These muscles are in the upper calf (gastrocnemius) and the lower calf (soleus). Stand with the ball of your left / right foot on the front of a step. The ball of your foot is on the walking surface, right under your toes. Keep your other foot firmly on the same step. Hold on to the wall or a railing for balance. Slowly lift your other foot, allowing your body weight to press your heel down over the edge of the front of the step. Keep knee straight and unbent. You should feel a stretch in your calf. Hold this position for __________ seconds. Return both feet to the step. Repeat this exercise with a slight bend in your left / right knee. Repeat __________ times with your left / right knee straight and __________ times with your left / right knee bent. Complete this exercise __________ times a day. Balance exercise This exercise builds your balance and strength control of your arch to help take pressure off your plantar fascia. Single leg stand If this exercise is too easy, you can try it with your eyes closed or while standing on a pillow. Without shoes, stand near a   railing or in a doorway. You may hold on to the railing or door frame as needed. Stand on your left / right foot. Keep your big toe down on the floor and lift the arch of your foot. You should feel a stretch across the bottom of your foot and your arch. Do not let your foot roll inward. Hold this position for __________ seconds. Repeat __________ times. Complete this exercise __________ times a day. This  information is not intended to replace advice given to you by your health care provider. Make sure you discuss any questions you have with your health care provider. Document Revised: 11/14/2019 Document Reviewed: 11/14/2019 Elsevier Patient Education  [place] Neuralgia  [place] Lathe neuralgia is foot pain that affects the ball of the foot and the area near the toes. Morton neuralgia occurs when part of a nerve in the foot (digital nerve) is under too much pressure (compressed). When this happens over a long period of time, the nerve can thicken (neuroma) and cause pain. Pain usually occurs between the third and fourth toes.  Morton neuralgia can come and go but may get worse over time. What are the causes? This condition is caused by doing the same things over and over with your foot, such as: Activities such as running or jumping. Wearing shoes that are too tight. What increases the risk? You may be at higher risk for Morton neuralgia if you: Are female. Wear high heels. Wear shoes that are narrow or tight. Do activities that repeatedly stretch your toes, such as: Running. Ballet. Long-distance walking. What are the signs or symptoms? The first symptom of Morton neuralgia is pain that spreads from the ball of the foot to the toes. It may feel like you are walking on a marble. Pain usually gets worse with walking and goes away at night. Other symptoms may include numbness and cramping of your toes. Both feet are equally affected, but rarely at the same time. How is this diagnosed? This condition is diagnosed based on your symptoms, your medical history, and a physical exam. Your health care provider may: Squeeze your foot just behind your toe. Ask you to move your toes to check for pain. Ask about your physical activity level. You also may have imaging tests, such as an X-ray, ultrasound, or MRI. How is this treated? Treatment depends on how severe your condition is and  what causes it. Treatment may involve: Wearing different shoes that are not too tight, are low-heeled, and provide good support. For some people, this is the only treatment needed. Wearing an over-the-counter or custom supportive pad (orthotic) under the front of your foot. Getting injections of numbing medicine and anti-inflammatory medicine (steroid) in the nerve. Having surgery to remove part of the thickened nerve. Follow these instructions at home: Managing pain, stiffness, and swelling  Massage your foot as needed. Wear orthotics as told by your health care provider. If directed, put ice on the painful area. To do this: Put ice in a plastic bag. Place a towel between your skin and the bag. Leave the ice on for 20 minutes, 2-3 times a day. Remove the ice if your skin turns bright red. This is very important. If you cannot feel pain, heat, or cold, you have a greater risk of damage to the area. Raise (elevate) the injured area above the level of your heart while you are sitting or lying down. Avoid activities that cause pain or make pain worse. If you  play sports, ask your health care provider when it is safe for you to return to sports. General instructions Take over-the-counter and prescription medicines only as told by your health care provider. For the time period you were told by your health care provider, do not drive or use machinery. Wear shoes that: Have soft soles. Have a wide toe area. Provide arch support. Do not pinch or squeeze your feet. Have room for your orthotics, if this applies. Keep all follow-up visits. This is important. Contact a health care provider if: Your symptoms get worse or do not get better with treatment and home care. Summary Morton neuralgia is foot pain that affects the ball of the foot and the area near the toes. Pain usually occurs between the third and fourth toes, gets worse with walking, and goes away at night. Morton neuralgia occurs when  part of a nerve in the foot (digital nerve) is under too much pressure. When this happens over a long period of time, the nerve can thicken (neuroma) and cause pain. This condition is caused by doing the same things over and over with your foot, such as running or jumping, wearing shoes that are too tight, or wearing high heels. Treatment may involve wearing low-heeled shoes that are not too tight, wearing a supportive pad (orthotic) under the front of your foot, getting injections in the nerve, or having surgery to remove part of the thickened nerve. This information is not intended to replace advice given to you by your health care provider. Make sure you discuss any questions you have with your health care provider. Document Revised: 07/10/2020 Document Reviewed: 07/10/2020 Elsevier Patient Education  [place].

## 2021-09-08 NOTE — Progress Notes (Signed)
Established patient acute visit   Patient: [name]   DOB: 11-22-1980   41 y.o. Female  MRN: [id number] Visit Date: 09/08/2021  Chief Complaint  Patient presents with   Foot Pain   Subjective    HPI  Patient presents with c/o bilateral foot pain. Left foot- nails have become thick and 4th toe is ingrown, painful to walk. Right foot- big toe joint to arch and across toes at the bottom are painful, thought is was her boots but it also hurts when laying in bed.       Medications: Outpatient Medications Prior to Visit  Medication Sig   Cholecalciferol (VITAMIN D PO) Take 500 mg by mouth 2 (two) times daily.   DIAZEPAM PO Take 10 mg by mouth daily as needed (for muscle pain---patient reported).   loratadine (CLARITIN) 10 MG tablet TAKE 1 TABLET BY MOUTH ONCE DAILY   omeprazole (PRILOSEC) 40 MG capsule Take 1 capsule (40 mg total) by mouth daily.   ondansetron (ZOFRAN) 4 MG tablet Take 1 tablet (4 mg total) by mouth every 8 (eight) hours as needed for nausea or vomiting.   oxyCODONE (ROXICODONE) 15 MG immediate release tablet Take 15 mg by mouth every 4 (four) hours as needed for pain.   Rimegepant Sulfate (NURTEC) 75 MG TBDP Dissolve 1 tablet on tongue as needed for acute headache. Max dose 75 mg/day.   SUMAtriptan (IMITREX) 50 MG tablet Take 1 tablet (50 mg total) by mouth every 2 (two) hours as needed for migraine. May repeat in 2 hours if headache persists or recurs.   VENTOLIN HFA 108 (90 Base) MCG/ACT inhaler Inhale 2 puffs into the lungs every 6 (six) hours as needed for wheezing or shortness of breath.   [DISCONTINUED] ciclopirox (PENLAC) 8 % solution Apply topically at bedtime. Apply over nail and surrounding skin. Apply daily over previous coat. After seven (7) days, may remove with alcohol and continue cycle.   budesonide-formoterol (SYMBICORT) 80-4.5 MCG/ACT inhaler Inhale 2 puffs into the lungs 2 (two) times daily. (Patient not taking: Reported on 09/08/2021)    mometasone-formoterol (DULERA) 100-5 MCG/ACT AERO Inhale 2 puffs into the lungs 2 (two) times daily. (Patient not taking: Reported on 09/08/2021)   No facility-administered medications prior to visit.    Review of Systems Review of Systems:  A fourteen system review of systems was performed and found to be positive as per HPI.      Objective    BP 102/68   Pulse 91   Ht '5\' 3"'$  (1.6 m)   Wt 164 lb (74.4 kg)   SpO2 97%   BMI 29.05 kg/m    Physical Exam  General:  Pleasant and cooperative, appropriate for stated age.  Neuro:  Alert and oriented,  extra-ocular muscles intact  HEENT:  Normocephalic, atraumatic, neck supple  Skin:  no gross rash, warm, pink. Cardiac:  RRR Respiratory: CTA B/L  Vascular:  Ext warm, no cyanosis apprec.;  Extremities: all toes of left foot thick with yellow discoloration, tenderness of right great toe and along plantar fascia, good ROM of both feet Psych:  No HI/SI, judgement and insight good, Euthymic mood. Full Affect.   No results found for any visits on 09/08/21.  Assessment & Plan     Patient presenting with multiple foot complaints. Discussed onychomycosis of the feet can be challenging to treat. Will start oral terbinafine 250 mg daily x 12 weeks. Patient recently had labs with pain management, will request records. Advised to return in  6 weeks for lab visit to repeat liver function. Advised to discontinue topical ciclopirox. Discussed with patient has s/sx suggestive of plantar fasciitis, provided handout with information. Recommend referral to podiatry as well and patient is agreeable.    Return for lab visit in 6 weeks for liver function .        Lorrene Reid, PA-C  [facility] [phone] (phone) [phone] (fax)  [place]

## 2021-09-16 ENCOUNTER — Other Ambulatory Visit: Payer: Self-pay | Admitting: Physician Assistant

## 2021-09-16 DIAGNOSIS — J4540 Moderate persistent asthma, uncomplicated: Secondary | ICD-10-CM

## 2021-10-05 ENCOUNTER — Ambulatory Visit (INDEPENDENT_AMBULATORY_CARE_PROVIDER_SITE_OTHER): Payer: Medicare HMO | Admitting: Podiatry

## 2021-10-05 ENCOUNTER — Ambulatory Visit (INDEPENDENT_AMBULATORY_CARE_PROVIDER_SITE_OTHER): Payer: Medicare HMO

## 2021-10-05 DIAGNOSIS — B351 Tinea unguium: Secondary | ICD-10-CM | POA: Diagnosis not present

## 2021-10-05 DIAGNOSIS — M79671 Pain in right foot: Secondary | ICD-10-CM

## 2021-10-05 DIAGNOSIS — M722 Plantar fascial fibromatosis: Secondary | ICD-10-CM | POA: Diagnosis not present

## 2021-10-05 NOTE — Patient Instructions (Signed)
You can also use UREA NAIL GEL on the toenails to help with the thickening  For inserts I like POWERSTEPS, [place], [employer].   Plantar Fasciitis (Heel Spur Syndrome) with Rehab The plantar fascia is a fibrous, ligament-like, soft-tissue structure that spans the bottom of the foot. Plantar fasciitis is a condition that causes pain in the foot due to inflammation of the tissue. SYMPTOMS  Pain and tenderness on the underneath side of the foot. Pain that worsens with standing or walking. CAUSES  Plantar fasciitis is caused by irritation and injury to the plantar fascia on the underneath side of the foot. Common mechanisms of injury include: Direct trauma to bottom of the foot. Damage to a small nerve that runs under the foot where the main fascia attaches to the heel bone. Stress placed on the plantar fascia due to bone spurs. RISK INCREASES WITH:  Activities that place stress on the plantar fascia (running, jumping, pivoting, or cutting). Poor strength and flexibility. Improperly fitted shoes. Tight calf muscles. Flat feet. Failure to warm-up properly before activity. Obesity. PREVENTION Warm up and stretch properly before activity. Allow for adequate recovery between workouts. Maintain physical fitness: Strength, flexibility, and endurance. Cardiovascular fitness. Maintain a health body weight. Avoid stress on the plantar fascia. Wear properly fitted shoes, including arch supports for individuals who have flat feet.  PROGNOSIS  If treated properly, then the symptoms of plantar fasciitis usually resolve without surgery. However, occasionally surgery is necessary.  RELATED COMPLICATIONS  Recurrent symptoms that may result in a chronic condition. Problems of the lower back that are caused by compensating for the injury, such as limping. Pain or weakness of the foot during push-off following surgery. Chronic inflammation, scarring, and partial or complete fascia tear, occurring  more often from repeated injections.  TREATMENT  Treatment initially involves the use of ice and medication to help reduce pain and inflammation. The use of strengthening and stretching exercises may help reduce pain with activity, especially stretches of the Achilles tendon. These exercises may be performed at home or with a therapist. Your caregiver may recommend that you use heel cups of arch supports to help reduce stress on the plantar fascia. Occasionally, corticosteroid injections are given to reduce inflammation. If symptoms persist for greater than 6 months despite non-surgical (conservative), then surgery may be recommended.   MEDICATION  If pain medication is necessary, then nonsteroidal anti-inflammatory medications, such as aspirin and ibuprofen, or other minor pain relievers, such as acetaminophen, are often recommended. Do not take pain medication within 7 days before surgery. Prescription pain relievers may be given if deemed necessary by your caregiver. Use only as directed and only as much as you need. Corticosteroid injections may be given by your caregiver. These injections should be reserved for the most serious cases, because they may only be given a certain number of times.  HEAT AND COLD Cold treatment (icing) relieves pain and reduces inflammation. Cold treatment should be applied for 10 to 15 minutes every 2 to 3 hours for inflammation and pain and immediately after any activity that aggravates your symptoms. Use ice packs or massage the area with a piece of ice (ice massage). Heat treatment may be used prior to performing the stretching and strengthening activities prescribed by your caregiver, physical therapist, or athletic trainer. Use a heat pack or soak the injury in warm water.  SEEK IMMEDIATE MEDICAL CARE IF: Treatment seems to offer no benefit, or the condition worsens. Any medications produce adverse side effects.  EXERCISES- RANGE  OF MOTION (ROM) AND  STRETCHING EXERCISES - Plantar Fasciitis (Heel Spur Syndrome) These exercises may help you when beginning to rehabilitate your injury. Your symptoms may resolve with or without further involvement from your physician, physical therapist or athletic trainer. While completing these exercises, remember:  Restoring tissue flexibility helps normal motion to return to the joints. This allows healthier, less painful movement and activity. An effective stretch should be held for at least 30 seconds. A stretch should never be painful. You should only feel a gentle lengthening or release in the stretched tissue.  RANGE OF MOTION - Toe Extension, Flexion Sit with your right / left leg crossed over your opposite knee. Grasp your toes and gently pull them back toward the top of your foot. You should feel a stretch on the bottom of your toes and/or foot. Hold this stretch for 10 seconds. Now, gently pull your toes toward the bottom of your foot. You should feel a stretch on the top of your toes and or foot. Hold this stretch for 10 seconds. Repeat  times. Complete this stretch 3 times per day.   RANGE OF MOTION - Ankle Dorsiflexion, Active Assisted Remove shoes and sit on a chair that is preferably not on a carpeted surface. Place right / left foot under knee. Extend your opposite leg for support. Keeping your heel down, slide your right / left foot back toward the chair until you feel a stretch at your ankle or calf. If you do not feel a stretch, slide your bottom forward to the edge of the chair, while still keeping your heel down. Hold this stretch for 10 seconds. Repeat 3 times. Complete this stretch 2 times per day.   STRETCH  Gastroc, Standing Place hands on wall. Extend right / left leg, keeping the front knee somewhat bent. Slightly point your toes inward on your back foot. Keeping your right / left heel on the floor and your knee straight, shift your weight toward the wall, not allowing your back  to arch. You should feel a gentle stretch in the right / left calf. Hold this position for 10 seconds. Repeat 3 times. Complete this stretch 2 times per day.  STRETCH  Soleus, Standing Place hands on wall. Extend right / left leg, keeping the other knee somewhat bent. Slightly point your toes inward on your back foot. Keep your right / left heel on the floor, bend your back knee, and slightly shift your weight over the back leg so that you feel a gentle stretch deep in your back calf. Hold this position for 10 seconds. Repeat 3 times. Complete this stretch 2 times per day.  STRETCH  Gastrocsoleus, Standing  Note: This exercise can place a lot of stress on your foot and ankle. Please complete this exercise only if specifically instructed by your caregiver.  Place the ball of your right / left foot on a step, keeping your other foot firmly on the same step. Hold on to the wall or a rail for balance. Slowly lift your other foot, allowing your body weight to press your heel down over the edge of the step. You should feel a stretch in your right / left calf. Hold this position for 10 seconds. Repeat this exercise with a slight bend in your right / left knee. Repeat 3 times. Complete this stretch 2 times per day.   STRENGTHENING EXERCISES - Plantar Fasciitis (Heel Spur Syndrome)  These exercises may help you when beginning to rehabilitate your injury. They  may resolve your symptoms with or without further involvement from your physician, physical therapist or athletic trainer. While completing these exercises, remember:  Muscles can gain both the endurance and the strength needed for everyday activities through controlled exercises. Complete these exercises as instructed by your physician, physical therapist or athletic trainer. Progress the resistance and repetitions only as guided.  STRENGTH - Towel Curls Sit in a chair positioned on a non-carpeted surface. Place your foot on a towel,  keeping your heel on the floor. Pull the towel toward your heel by only curling your toes. Keep your heel on the floor. Repeat 3 times. Complete this exercise 2 times per day.  STRENGTH - Ankle Inversion Secure one end of a rubber exercise band/tubing to a fixed object (table, pole). Loop the other end around your foot just before your toes. Place your fists between your knees. This will focus your strengthening at your ankle. Slowly, pull your big toe up and in, making sure the band/tubing is positioned to resist the entire motion. Hold this position for 10 seconds. Have your muscles resist the band/tubing as it slowly pulls your foot back to the starting position. Repeat 3 times. Complete this exercises 2 times per day.  Document Released: 01/31/2005 Document Revised: 04/25/2011 Document Reviewed: 05/15/2008 [facility] Patient Information 2014 [place], [place].

## 2021-10-05 NOTE — Progress Notes (Signed)
Subjective:   Patient ID: [name], female   DOB: 41 y.o.   MRN: [id number]   HPI Chief Complaint  Patient presents with   Foot Problem    Onychomycosis, foot pain, pain located in the arch and radiates to the toes     41 year old female presents with above concerns.  She states that she is having some foot discomfort about 1 month ago.  She said that she did on her foot but she is not sure that is what started this or not.  She said it feels worse when she stands on it.  Also hurts when doing a lot of walking.  Some intermittent swelling.  No redness or warmth.  Also consider about nail fungus.  She tried topical and she is currently on Lamisil which she recently started by her primary care doctor.   Review of Systems  All other systems reviewed and are negative.  Past Medical History:  Diagnosis Date   Anxiety    no meds   Asthma    "very badly" (01/17/2012)   Cervical cancer ([place]) 1999; 2000   CHF (congestive heart failure) ([place]) 2006   Resolved -History"with 2006 pregnancy, toxcemia "  not seeing a cardiologist now.   Chronic back pain    Chronic bronchitis ([place])    "@ least once/yr" (01/17/2012) - none since 11/2583   Complication of anesthesia    back surgeries x 6 - fusions T8-S2- rods   Degenerative disk disease    Difficult intubation    "needs small tube"   Family history of anesthesia complication    Nausea   GERD (gastroesophageal reflux disease)    Migraines    last migraine 09/2013   Neuromuscular disorder ([place])    sciatica  - affecting left leg numb/pain   Pneumonia    "~ once/yr" (01/17/2012) none since 01/2012   PONV (postoperative nausea and vomiting)    Respiratory failure ([place]) 11/2010; 09/2011   Resolved  - History pneumonia    Past Surgical History:  Procedure Laterality Date   ADENOIDECTOMY, TONSILLECTOMY AND [place]?   ANTERIOR LUMBAR FUSION  01/17/2012   Procedure: ANTERIOR LUMBAR FUSION 2 LEVELS;   Surgeon: Lowella Grip, MD;  Location: [facility];  Service: Orthopedics;  Laterality: Bilateral;  Anterior Lumbar 3-4, Lumbar 4-5 fusion with bone graft harvest   ANTERIOR LUMBAR FUSION     BACK SURGERY  02/19/2014   CERVICAL CONE BIOPSY  1999; 2000   cervical cancer   CESAREAN SECTION N/A 11/08/2013   Procedure: REPEAT CESAREAN SECTION;  Surgeon: Shelly Bombard, MD;  Location: [facility];  Service: Obstetrics;  Laterality: N/A;   KNEE ARTHROSCOPY  2004 X 2   "left X 2" (01/17/2012)   LUMBAR DISC SURGERY  06/1998   "L4-5" (01/17/2012)   LUMBAR FUSION  08/1998; 2010; 2012; 06/2011, 02/19/14   "fusion cage; put rods in; reanchored screws; rod broke" (01/17/2012); Had son in 10/2013 and rods in back broke.    SPINAL FUSION  10/04/2011   Procedure: FUSION POSTERIOR SPINAL MULTILEVEL;  Surgeon: Lowella Grip, MD;  Location: [facility];  Service: Orthopedics;  Laterality: Bilateral;  repair lumbar pseudoarthritis, revision segmental fixation, L3-L4, L4-L5   SPINAL FUSION  07/06/2015   T7 - S2   TONSILLECTOMY       Current Outpatient Medications:    albuterol (VENTOLIN HFA) 108 (90 Base) MCG/ACT inhaler, TAKE 2 PUFFS BY MOUTH EVERY 6 HOURS AS NEEDED FOR WHEEZE  OR SHORTNESS OF BREATH, Disp: 18 each, Rfl: 0   budesonide-formoterol (SYMBICORT) 80-4.5 MCG/ACT inhaler, Inhale 2 puffs into the lungs 2 (two) times daily., Disp: 1 Inhaler, Rfl: 3   Cholecalciferol (VITAMIN D PO), Take 500 mg by mouth 2 (two) times daily., Disp: , Rfl:    DIAZEPAM PO, Take 10 mg by mouth daily as needed (for muscle pain---patient reported)., Disp: , Rfl:    loratadine (CLARITIN) 10 MG tablet, TAKE 1 TABLET BY MOUTH ONCE DAILY, Disp: 30 tablet, Rfl: 10   mometasone-formoterol (DULERA) 100-5 MCG/ACT AERO, Inhale 2 puffs into the lungs 2 (two) times daily., Disp: 13 g, Rfl: 1   omeprazole (PRILOSEC) 40 MG capsule, Take 1 capsule (40 mg total) by mouth daily., Disp: 90 capsule, Rfl: 1   ondansetron (ZOFRAN) 4 MG tablet, Take 1 tablet  (4 mg total) by mouth every 8 (eight) hours as needed for nausea or vomiting., Disp: 20 tablet, Rfl: 0   oxyCODONE (ROXICODONE) 15 MG immediate release tablet, Take 15 mg by mouth every 4 (four) hours as needed for pain., Disp: , Rfl:    Rimegepant Sulfate (NURTEC) 75 MG TBDP, Dissolve 1 tablet on tongue as needed for acute headache. Max dose 75 mg/day., Disp: 15 tablet, Rfl: 1   SUMAtriptan (IMITREX) 50 MG tablet, Take 1 tablet (50 mg total) by mouth every 2 (two) hours as needed for migraine. May repeat in 2 hours if headache persists or recurs., Disp: 10 tablet, Rfl: 5   terbinafine (LAMISIL) 250 MG tablet, Take 1 tablet (250 mg total) by mouth daily., Disp: 90 tablet, Rfl: 0  Allergies  Allergen Reactions   Iodine Itching and Rash    Pt states she has used iodine and has had no reaction, she is allergic to steri strips. 08-30-16   Other Rash    Compound Tincture of Benzoin           Objective:  Physical Exam  General: AAO x3, NAD  Dermatological: Nails appear to be hypertrophic, dystrophic with yellow, brown discoloration.  There is no edema, erythema or signs of infection.  No open lesions.  Vascular: Dorsalis Pedis artery and Posterior Tibial artery pedal pulses are 2/4 bilateral with immedate capillary fill time. There is no pain with calf compression, swelling, warmth, erythema.   Neruologic: Grossly intact via light touch bilateral.   Musculoskeletal: Palpation of dorsal aspect of the foot however there is no specific area pinpoint tenderness.  No edema, erythema.  There is mild discomfort on the plantar aspect of the plantar fascia.  Clinically tender appears to be intact.  No pain Achilles tendon.  <uscular strength 5/5 in all groups tested bilateral.  Gait: Unassisted, Nonantalgic.       Assessment:   41 year old female with onychomycosis, tendinitis     Plan:  -Treatment options discussed including all alternatives, risks, and complications. -Etiology of symptoms  were discussed -X-rays were obtained and reviewed with the patient.  3 views bilateral feet were obtained.  No evidence of acute fracture. -Regards to her foot there were discussed traction, icing daily.  Discussed shoe modifications and adding inserts inside of her shoes.  Discussed topical medication such as topical Voltaren gel to be helpful as well. -Continue Lamisil for nail fungus.  Discussed adding urea nail gel to help with the thickening of the nails.  Trula Slade DPM

## 2021-10-13 ENCOUNTER — Other Ambulatory Visit: Payer: Self-pay | Admitting: Podiatry

## 2021-10-13 DIAGNOSIS — M722 Plantar fascial fibromatosis: Secondary | ICD-10-CM

## 2021-10-16 ENCOUNTER — Other Ambulatory Visit: Payer: Self-pay | Admitting: Physician Assistant

## 2021-10-16 DIAGNOSIS — J4540 Moderate persistent asthma, uncomplicated: Secondary | ICD-10-CM

## 2021-10-21 ENCOUNTER — Other Ambulatory Visit: Payer: Medicare HMO

## 2021-11-01 ENCOUNTER — Other Ambulatory Visit: Payer: Self-pay

## 2021-11-01 DIAGNOSIS — E785 Hyperlipidemia, unspecified: Secondary | ICD-10-CM

## 2021-11-02 ENCOUNTER — Other Ambulatory Visit: Payer: Medicare HMO

## 2021-11-26 ENCOUNTER — Other Ambulatory Visit: Payer: Medicare HMO

## 2021-11-27 LAB — HEPATIC FUNCTION PANEL
ALT: 21 IU/L (ref 0–32)
AST: 25 IU/L (ref 0–40)
Albumin: 4.5 g/dL (ref 3.9–4.9)
Alkaline Phosphatase: 57 IU/L (ref 44–121)
Bilirubin Total: <0.2 mg/dL (ref 0.0–1.2)
Bilirubin, Direct: <0.1 mg/dL (ref 0.00–0.40)
Total Protein: 6.6 g/dL (ref 6.0–8.5)

## 2021-12-06 ENCOUNTER — Ambulatory Visit: Payer: Medicare HMO | Admitting: Podiatry

## 2022-02-09 ENCOUNTER — Ambulatory Visit: Payer: Medicare HMO | Admitting: Obstetrics

## 2022-02-23 ENCOUNTER — Encounter: Payer: Self-pay | Admitting: Obstetrics

## 2022-02-23 ENCOUNTER — Other Ambulatory Visit (HOSPITAL_COMMUNITY)
Admission: RE | Admit: 2022-02-23 | Discharge: 2022-02-23 | Disposition: A | Discharge status: Home / Self Care | Payer: Medicare HMO | Source: Ambulatory Visit | Attending: Obstetrics | Admitting: Obstetrics

## 2022-02-23 ENCOUNTER — Ambulatory Visit (INDEPENDENT_AMBULATORY_CARE_PROVIDER_SITE_OTHER): Payer: Medicare HMO | Admitting: Obstetrics

## 2022-02-23 VITALS — BP 123/84 | HR 83 | Ht 61.5 in | Wt 172.0 lb

## 2022-02-23 DIAGNOSIS — J4520 Mild intermittent asthma, uncomplicated: Secondary | ICD-10-CM

## 2022-02-23 DIAGNOSIS — Z1151 Encounter for screening for human papillomavirus (HPV): Secondary | ICD-10-CM | POA: Insufficient documentation

## 2022-02-23 DIAGNOSIS — N946 Dysmenorrhea, unspecified: Secondary | ICD-10-CM

## 2022-02-23 DIAGNOSIS — Z01419 Encounter for gynecological examination (general) (routine) without abnormal findings: Secondary | ICD-10-CM | POA: Insufficient documentation

## 2022-02-23 DIAGNOSIS — N898 Other specified noninflammatory disorders of vagina: Secondary | ICD-10-CM

## 2022-02-23 DIAGNOSIS — Z0000 Encounter for general adult medical examination without abnormal findings: Secondary | ICD-10-CM | POA: Diagnosis not present

## 2022-02-23 DIAGNOSIS — Z1239 Encounter for other screening for malignant neoplasm of breast: Secondary | ICD-10-CM

## 2022-02-23 DIAGNOSIS — T8332XD Displacement of intrauterine contraceptive device, subsequent encounter: Secondary | ICD-10-CM

## 2022-02-23 DIAGNOSIS — K029 Dental caries, unspecified: Secondary | ICD-10-CM

## 2022-02-23 MED ORDER — ALBUTEROL SULFATE HFA 108 (90 BASE) MCG/ACT IN AERS: INHALATION_SPRAY | RESPIRATORY_TRACT | 5 refills | Status: DC

## 2022-02-23 MED ORDER — LORATADINE 10 MG PO TABS: 10.0000 mg | ORAL_TABLET | Freq: Every day | ORAL | 11 refills | Status: DC

## 2022-02-23 MED ORDER — IBUPROFEN 800 MG PO TABS: 800.0000 mg | ORAL_TABLET | Freq: Three times a day (TID) | ORAL | 5 refills | Status: AC | PRN

## 2022-02-23 NOTE — Progress Notes (Signed)
42 y.o GYN presents for AEX/PAP.  C/o night sweats, feeling cold a lot,

## 2022-02-23 NOTE — Progress Notes (Signed)
Subjective:        [name] is a 42 y.o. female here for a routine exam.  Current complaints: Vaginal discharge.  Cannot feel IUD string..    Personal health questionnaire:  Is patient [name], have a family history of breast and/or ovarian cancer: no Is there a family history of uterine cancer diagnosed at age < 56, gastrointestinal cancer, urinary tract cancer, family member who is a Field seismologist syndrome-associated carrier: no Is the patient overweight and hypertensive, family history of diabetes, personal history of gestational diabetes, preeclampsia or PCOS: no Is patient over 16, have PCOS,  family history of premature CHD under age 6, diabetes, smoke, have hypertension or peripheral artery disease:  no At any time, has a partner hit, kicked or otherwise hurt or frightened you?: no Over the past 2 weeks, have you felt down, depressed or hopeless?: no Over the past 2 weeks, have you felt little interest or pleasure in doing things?:no   Gynecologic History No LMP recorded (lmp unknown). (Menstrual status: IUD). Contraception: IUD Last Pap: 2020. Results were: normal Last mammogram: 2017. Results were: normal  Obstetric History OB History  Gravida Para Term Preterm AB Living  '3 2 2   1 2  '$ SAB IAB Ectopic Multiple Live Births  1       2    # Outcome Date GA Lbr Len/2nd Weight Sex Delivery Anes PTL Lv  3 Term 11/08/13 [redacted]w[redacted]d 6 lb 4.9 oz (2.86 kg) M CS-LTranv Gen  LIV  2 Term 09/19/04 [id number] lb 6 oz (2.892 kg) M CS-LTranv EPI  LIV  1 SAB 1996 5w53w0d        Past Medical History:  Diagnosis Date   Anxiety    no meds   Asthma    "very badly" (01/17/2012)   Cervical cancer ([place]; 2000   CHF (congestive heart failure) ([place]CC[place]006   Resolved -History"with 2006 pregnancy, toxcemia "  not seeing a cardiologist now.   Chronic back pain    Chronic bronchitis ([facility]  "@ least once/yr" (01/17/2012) - none since 12/27/0350Complication of anesthesia     back surgeries x 6 - fusions T8-S2- rods   Degenerative disk disease    Difficult intubation    "needs small tube"   Family history of anesthesia complication    Nausea   GERD (gastroesophageal reflux disease)    Migraines    last migraine 09/2013   Neuromuscular disorder ([facility]  sciatica  - affecting left leg numb/pain   Pneumonia    "~ once/yr" (01/17/2012) none since 01/2012   PONV (postoperative nausea and vomiting)    Respiratory failure ([place]CC[place]0/2012; 09/2011   Resolved  - History pneumonia    Past Surgical History:  Procedure Laterality Date   ADENOIDECTOMY, [place]ILLECTOMY AND [place] ANTERIOR LUMBAR FUSION  01/17/2012   Procedure: ANTERIOR LUMBAR FUSION 2 LEVELS;  Surgeon: SydLowella GripD;  [facility];  Laterality: Bilateral;  Anterior Lumbar 3-4, Lumbar 4-5 fusion with bone graft harvest   ANTERIOR LUMBAR FUSION     BACK SURGERY  02/19/2014   CERVICAL CONE BIOPSY  1999; 2000   cervical cancer   CESAREAN SECTION N/A 11/08/2013   Procedure: REPEAT CESAREAN SECTION;  Surgeon: ChaShelly BombardD;  [facility];  Service: Obstetrics;  Laterality: N/A;   KNEE ARTHROSCOPY  2004 X 2   "  left X 2" (01/17/2012)   LUMBAR DISC SURGERY  06/1998   "L4-5" (01/17/2012)   LUMBAR FUSION  08/1998; 2010; 2012; 06/2011, 02/19/14   "fusion cage; put rods in; reanchored screws; rod broke" (01/17/2012); Had son in 10/2013 and rods in back broke.    SPINAL FUSION  10/04/2011   Procedure: FUSION POSTERIOR SPINAL MULTILEVEL;  Surgeon: Lowella Grip, MD;  Location: [place];  Service: Orthopedics;  Laterality: Bilateral;  repair lumbar pseudoarthritis, revision segmental fixation, L3-L4, L4-L5   SPINAL FUSION  07/06/2015   T7 - S2   TONSILLECTOMY       Current Outpatient Medications:    DIAZEPAM PO, Take 10 mg by mouth daily as needed (for muscle pain---patient reported)., Disp: , Rfl:    ibuprofen (ADVIL) 800 MG tablet, Take 1  tablet (800 mg total) by mouth every 8 (eight) hours as needed., Disp: 30 tablet, Rfl: 5   omeprazole (PRILOSEC) 40 MG capsule, Take 1 capsule (40 mg total) by mouth daily., Disp: 90 capsule, Rfl: 1   oxyCODONE (ROXICODONE) 15 MG immediate release tablet, Take 15 mg by mouth every 4 (four) hours as needed for pain., Disp: , Rfl:    albuterol (VENTOLIN HFA) 108 (90 Base) MCG/ACT inhaler, TAKE 2 PUFFS BY MOUTH EVERY 6 HOURS AS NEEDED FOR WHEEZE OR SHORTNESS OF BREATH, Disp: 18 each, Rfl: 5   budesonide-formoterol (SYMBICORT) 80-4.5 MCG/ACT inhaler, Inhale 2 puffs into the lungs 2 (two) times daily. (Patient not taking: Reported on 02/23/2022), Disp: 1 Inhaler, Rfl: 3   Cholecalciferol (VITAMIN D PO), Take 500 mg by mouth 2 (two) times daily. (Patient not taking: Reported on 02/23/2022), Disp: , Rfl:    loratadine (CLARITIN) 10 MG tablet, Take 1 tablet (10 mg total) by mouth daily., Disp: 30 tablet, Rfl: 11   mometasone-formoterol (DULERA) 100-5 MCG/ACT AERO, Inhale 2 puffs into the lungs 2 (two) times daily., Disp: 13 g, Rfl: 1   ondansetron (ZOFRAN) 4 MG tablet, Take 1 tablet (4 mg total) by mouth every 8 (eight) hours as needed for nausea or vomiting. (Patient not taking: Reported on 02/23/2022), Disp: 20 tablet, Rfl: 0   Rimegepant Sulfate (NURTEC) 75 MG TBDP, Dissolve 1 tablet on tongue as needed for acute headache. Max dose 75 mg/day., Disp: 15 tablet, Rfl: 1   SUMAtriptan (IMITREX) 50 MG tablet, Take 1 tablet (50 mg total) by mouth every 2 (two) hours as needed for migraine. May repeat in 2 hours if headache persists or recurs. (Patient not taking: Reported on 02/23/2022), Disp: 10 tablet, Rfl: 5   terbinafine (LAMISIL) 250 MG tablet, Take 1 tablet (250 mg total) by mouth daily. (Patient not taking: Reported on 02/23/2022), Disp: 90 tablet, Rfl: 0 Allergies  Allergen Reactions   Iodine Itching and Rash    Pt states she has used iodine and has had no reaction, she is allergic to steri strips. 08-30-16    Other Rash    Compound Tincture of Benzoin     Social History   Tobacco Use   Smoking status: Former    Years: 15.00    Types: Cigarettes    Quit date: 02/15/2019    Years since quitting: 3.0   Smokeless tobacco: Current   Tobacco comments:    1/4-1/2 PPD  Substance Use Topics   Alcohol use: No    Family History  Problem Relation Age of Onset   COPD Mother    Breast cancer Mother    Heart disease Father 66   Hypertension Father  Diabetes Father        ESRD on dialysis   Deep vein thrombosis Father    Lung cancer Father    Cancer Other    Heart attack Sister 78       Died with MI   Drug abuse Brother    Depression Brother    Hyperlipidemia Brother    Hypertension Brother       Review of Systems  Constitutional: negative for fatigue and weight loss Respiratory: negative for cough and wheezing Cardiovascular: negative for chest pain, fatigue and palpitations Gastrointestinal: negative for abdominal pain and change in bowel habits Musculoskeletal:negative for myalgias Neurological: negative for gait problems and tremors Behavioral/Psych: negative for abusive relationship, depression Endocrine: negative for temperature intolerance    Genitourinary: positive for vbaginal discharge.  negative for abnormal menstrual periods, genital lesions, hot flashes, sexual problems and vaginal discharge Integument/breast: negative for breast lump, breast tenderness, nipple discharge and skin lesion(s)    Objective:       BP 123/84   Pulse 83   Ht 5' 1.5" (1.562 m)   Wt 172 lb (78 kg)   LMP  (LMP Unknown)   BMI 31.97 kg/m  General:   alert  Skin:   no rash or abnormalities  Lungs:   clear to auscultation bilaterally  Heart:   regular rate and rhythm, S1, S2 normal, no murmur, click, rub or gallop  Breasts:   normal without suspicious masses, skin or nipple changes or axillary nodes  Abdomen:  normal findings: no organomegaly, soft, non-tender and no hernia  Pelvis:   External genitalia: normal general appearance Urinary system: urethral meatus normal and bladder without fullness, nontender Vaginal: normal without tenderness, induration or masses Cervix: normal appearance.  IUD string not visible Adnexa: normal bimanual exam Uterus: anteverted and non-tender, normal size   Lab Review Urine pregnancy test Labs reviewed yes Radiologic studies reviewed yes  I have spent a total of 20 minutes of face-to-face time, excluding clinical staff time, reviewing notes and preparing to see patient, ordering tests and/or medications, and counseling the patient.   Assessment:    1. Encounter for gynecological examination with Papanicolaou smear of cervix Rx: - Cytology - PAP( [facility])  2. Intrauterine contraceptive device threads lost, subsequent encounter Rx: - US PELVIC COMPLETE WITH TRANSVAGINAL; Future  3. Dysmenorrhea Rx: - ibuprofen (ADVIL) 800 MG tablet; Take 1 tablet (800 mg total) by mouth every 8 (eight) hours as needed.  Dispense: 30 tablet; Refill: 5  4. Vaginal discharge Rx: - Cervicovaginal ancillary only( [facility])  5. Screening breast examination Rx: - MM 3D SCREEN BREAST BILATERAL; Future  6. Routine adult health maintenance Rx: - Ambulatory referral to Internal Medicine  7. Mild intermittent asthma without complication Rx: - loratadine (CLARITIN) 10 MG tablet; Take 1 tablet (10 mg total) by mouth daily.  Dispense: 30 tablet; Refill: 11 - albuterol (VENTOLIN HFA) 108 (90 Base) MCG/ACT inhaler; TAKE 2 PUFFS BY MOUTH EVERY 6 HOURS AS NEEDED FOR WHEEZE OR SHORTNESS OF BREATH  Dispense: 18 each; Refill: 5  8. Dental caries Rx: - Ambulatory referral to Dentistry    Plan:    Education reviewed: calcium supplements, depression evaluation, low fat, low cholesterol diet, safe sex/STD prevention, self breast exams, skin cancer screening, and weight bearing exercise. Contraception: IUD. Mammogram ordered. Follow up in: 2 weeks.    Meds ordered this encounter  Medications   loratadine (CLARITIN) 10 MG tablet    Sig: Take 1 tablet (10 mg total)  by mouth daily.    Dispense:  30 tablet    Refill:  11   albuterol (VENTOLIN HFA) 108 (90 Base) MCG/ACT inhaler    Sig: TAKE 2 PUFFS BY MOUTH EVERY 6 HOURS AS NEEDED FOR WHEEZE OR SHORTNESS OF BREATH    Dispense:  18 each    Refill:  5   ibuprofen (ADVIL) 800 MG tablet    Sig: Take 1 tablet (800 mg total) by mouth every 8 (eight) hours as needed.    Dispense:  30 tablet    Refill:  5   Orders Placed This Encounter  Procedures   MM 3D SCREEN BREAST BILATERAL    Standing Status:   Future    Standing Expiration Date:   02/24/2023    Order Specific Question:   Reason for Exam (SYMPTOM  OR DIAGNOSIS REQUIRED)    Answer:   Screening    Order Specific Question:   Is the patient pregnant?    Answer:   No    Order Specific Question:   Preferred imaging location?    Answer:   [facility]   US PELVIC COMPLETE WITH TRANSVAGINAL    Standing Status:   Future    Standing Expiration Date:   02/24/2023    Order Specific Question:   Reason for Exam (SYMPTOM  OR DIAGNOSIS REQUIRED)    Answer:   IUD strings.  Pelvic pain.    Order Specific Question:   Preferred imaging location?    Answer:   [facility]   Ambulatory referral to Internal Medicine    Referral Priority:   Routine    Referral Type:   Consultation    Referral Reason:   Specialty Services Required    Requested Specialty:   Internal Medicine    Number of Visits Requested:   1   Ambulatory referral to Dentistry    Referral Priority:   Routine    Referral Type:   Consultation    Referral Reason:   Specialty Services Required    Requested Specialty:   Dental General Practice    Number of Visits Requested:   1    Madyx Delfin A. Jodi Mourning MD 02/23/2022

## 2022-02-24 LAB — CERVICOVAGINAL ANCILLARY ONLY
Bacterial Vaginitis (gardnerella): POSITIVE — AB
Candida Glabrata: NEGATIVE
Candida Vaginitis: NEGATIVE
Chlamydia: NEGATIVE
Comment: NEGATIVE
Comment: NEGATIVE
Comment: NEGATIVE
Comment: NEGATIVE
Comment: NEGATIVE
Comment: NORMAL
Neisseria Gonorrhea: NEGATIVE
Trichomonas: NEGATIVE

## 2022-02-24 LAB — CYTOLOGY - PAP
Comment: NEGATIVE
Diagnosis: NEGATIVE
High risk HPV: NEGATIVE

## 2022-02-25 ENCOUNTER — Other Ambulatory Visit: Payer: Self-pay | Admitting: Obstetrics

## 2022-02-25 ENCOUNTER — Telehealth: Payer: Self-pay

## 2022-02-25 DIAGNOSIS — B9689 Other specified bacterial agents as the cause of diseases classified elsewhere: Secondary | ICD-10-CM

## 2022-02-25 MED ORDER — METRONIDAZOLE 500 MG PO TABS: 500.0000 mg | ORAL_TABLET | Freq: Two times a day (BID) | ORAL | 2 refills | Status: DC

## 2022-02-25 NOTE — Telephone Encounter (Signed)
-----  Message from Shelly Bombard, MD sent at 02/25/2022  8:34 AM EST ----- Flagyl Rx for BV.

## 2022-02-25 NOTE — Telephone Encounter (Signed)
I connected with  Callan Yontz Mauney on 02/25/22 by telephone and verified that I am speaking with the correct person using two identifiers.   I informed Vida of her cervicovaginal ancillary swab results and let Ajani know Dr. Jodi Mourning had prescribed Flagyl to take.   Kumiko had no further questions at this time. Phone call disconnected.

## 2022-03-03 ENCOUNTER — Other Ambulatory Visit: Payer: Self-pay

## 2022-03-04 ENCOUNTER — Ambulatory Visit (HOSPITAL_COMMUNITY): Admission: RE | Admit: 2022-03-04 | Payer: Medicare HMO | Source: Ambulatory Visit

## 2022-03-07 ENCOUNTER — Ambulatory Visit: Payer: Medicare HMO | Admitting: Nurse Practitioner

## 2022-03-09 ENCOUNTER — Telehealth (INDEPENDENT_AMBULATORY_CARE_PROVIDER_SITE_OTHER): Payer: Medicare HMO | Admitting: Obstetrics

## 2022-03-09 ENCOUNTER — Encounter: Payer: Self-pay | Admitting: Obstetrics

## 2022-03-09 DIAGNOSIS — T8332XD Displacement of intrauterine contraceptive device, subsequent encounter: Secondary | ICD-10-CM

## 2022-03-09 NOTE — Progress Notes (Signed)
TELEHEALTH GYNECOLOGY VISIT ENCOUNTER NOTE  Provider location: [facility]   Patient location: Home  I connected with [name] on 03/09/22 at  2:30 PM EST by telephone and verified that I am speaking with the correct person using two identifiers. Patient was unable to do MyChart audiovisual encounter due to technical difficulties, she tried several times.    I discussed the limitations, risks, security and privacy concerns of performing an evaluation and management service by telephone and the availability of in person appointments. I also discussed with the patient that there may be a patient responsible charge related to this service. The patient expressed understanding and agreed to proceed.   History:  [name] is a 42 y.o. [phone] female being evaluated today for ultrasound results, but she did not get ultrasound for IUD location. She denies any abnormal vaginal discharge, bleeding, pelvic pain or other concerns.       Past Medical History:  Diagnosis Date   Anxiety    no meds   Asthma    "very badly" (01/17/2012)   Cervical cancer ([place]) 1999; 2000   CHF (congestive heart failure) ([place]) 2006   Resolved -History"with 2006 pregnancy, toxcemia "  not seeing a cardiologist now.   Chronic back pain    Chronic bronchitis ([place])    "@ least once/yr" (01/17/2012) - none since 99/8338   Complication of anesthesia    back surgeries x 6 - fusions T8-S2- rods   Degenerative disk disease    Difficult intubation    "needs small tube"   Family history of anesthesia complication    Nausea   GERD (gastroesophageal reflux disease)    Migraines    last migraine 09/2013   Neuromuscular disorder ([place])    sciatica  - affecting left leg numb/pain   Pneumonia    "~ once/yr" (01/17/2012) none since 01/2012   PONV (postoperative nausea and vomiting)    Respiratory failure ([place]) 11/2010; 09/2011   Resolved  - History pneumonia   Past Surgical  History:  Procedure Laterality Date   ADENOIDECTOMY, TONSILLECTOMY AND [place]?   ANTERIOR LUMBAR FUSION  01/17/2012   Procedure: ANTERIOR LUMBAR FUSION 2 LEVELS;  Surgeon: Lowella Grip, MD;  Location: [place];  Service: Orthopedics;  Laterality: Bilateral;  Anterior Lumbar 3-4, Lumbar 4-5 fusion with bone graft harvest   ANTERIOR LUMBAR FUSION     BACK SURGERY  02/19/2014   CERVICAL CONE BIOPSY  1999; 2000   cervical cancer   CESAREAN SECTION N/A 11/08/2013   Procedure: REPEAT CESAREAN SECTION;  Surgeon: Shelly Bombard, MD;  Location: [facility];  Service: Obstetrics;  Laterality: N/A;   KNEE ARTHROSCOPY  2004 X 2   "left X 2" (01/17/2012)   LUMBAR DISC SURGERY  06/1998   "L4-5" (01/17/2012)   LUMBAR FUSION  08/1998; 2010; 2012; 06/2011, 02/19/14   "fusion cage; put rods in; reanchored screws; rod broke" (01/17/2012); Had son in 10/2013 and rods in back broke.    SPINAL FUSION  10/04/2011   Procedure: FUSION POSTERIOR SPINAL MULTILEVEL;  Surgeon: Lowella Grip, MD;  Location: [place];  Service: Orthopedics;  Laterality: Bilateral;  repair lumbar pseudoarthritis, revision segmental fixation, L3-L4, L4-L5   SPINAL FUSION  07/06/2015   T7 - S2   TONSILLECTOMY     The following portions of the patient's history were reviewed and updated as appropriate: allergies, current medications, past family history, past medical history, past social  history, past surgical history and problem list.   Health Maintenance:  Normal pap and negative HRHPV on 1/10/ 2024.  Normal mammogram on 2017.   Review of Systems:  Pertinent items noted in HPI and remainder of comprehensive ROS otherwise negative.  Physical Exam:   General:  Alert, oriented and cooperative.   Mental Status: Normal mood and affect perceived. Normal judgment and thought content.  Physical exam deferred due to nature of the encounter  Labs and Imaging Results for orders placed or performed in visit on  02/23/22 (from the past 336 hour(s))  Cytology - PAP( [facility])   Collection Time: 02/23/22  4:11 PM  Result Value Ref Range   High risk HPV Negative    Adequacy      Satisfactory for evaluation; transformation zone component PRESENT.   Diagnosis      - Negative for intraepithelial lesion or malignancy (NILM)   Comment Normal Reference Range HPV - Negative   Cervicovaginal ancillary only( [place])   Collection Time: 02/23/22  4:11 PM  Result Value Ref Range   Bacterial Vaginitis (gardnerella) Positive (A)    Candida Vaginitis Negative    Candida Glabrata Negative    Trichomonas Negative    Chlamydia Negative    Neisseria Gonorrhea Negative    Comment      Normal Reference Range Bacterial Vaginosis - Negative   Comment Normal Reference Range Candida Species - Negative    Comment Normal Reference Range Candida Galbrata - Negative    Comment Normal Reference Range Trichomonas - Negative    Comment Normal Reference [place] Chlamydia - Negative    Comment      Normal Reference Range Neisseria Gonorrhea - Negative   No results found.    Assessment and Plan:     1. Intrauterine contraceptive device threads lost, subsequent encounter - ultrasound rescheduled       I discussed the assessment and treatment plan with the patient. The patient was provided an opportunity to ask questions and all were answered. The patient agreed with the plan and demonstrated an understanding of the instructions.   The patient was advised to call back or seek an in-person evaluation/go to the ED if the symptoms worsen or if the condition fails to improve as anticipated.  I have spent a total of 10 minutes of non-face-to-face time, excluding clinical staff time, reviewing notes and preparing to see patient, ordering tests and/or medications, and counseling the patient.    Baltazar Najjar, MD Center for [facility], [place], [facility] 03/09/2022

## 2022-03-14 ENCOUNTER — Ambulatory Visit: Payer: Medicare HMO | Admitting: Podiatry

## 2022-03-15 ENCOUNTER — Other Ambulatory Visit (HOSPITAL_COMMUNITY): Payer: Medicare HMO

## 2022-03-16 ENCOUNTER — Ambulatory Visit (HOSPITAL_COMMUNITY)
Admission: RE | Admit: 2022-03-16 | Discharge: 2022-03-16 | Disposition: A | Discharge status: Home / Self Care | Payer: Medicare HMO | Source: Ambulatory Visit | Attending: Obstetrics | Admitting: Obstetrics

## 2022-03-16 DIAGNOSIS — T8332XD Displacement of intrauterine contraceptive device, subsequent encounter: Secondary | ICD-10-CM

## 2022-03-17 ENCOUNTER — Ambulatory Visit: Payer: Medicare HMO

## 2022-03-22 ENCOUNTER — Encounter: Payer: Self-pay | Admitting: Obstetrics

## 2022-03-22 ENCOUNTER — Telehealth (INDEPENDENT_AMBULATORY_CARE_PROVIDER_SITE_OTHER): Payer: Medicare HMO | Admitting: Obstetrics

## 2022-03-22 DIAGNOSIS — J069 Acute upper respiratory infection, unspecified: Secondary | ICD-10-CM | POA: Diagnosis not present

## 2022-03-22 DIAGNOSIS — Z975 Presence of (intrauterine) contraceptive device: Secondary | ICD-10-CM | POA: Diagnosis not present

## 2022-03-22 MED ORDER — AZITHROMYCIN 250 MG PO TABS: ORAL_TABLET | ORAL | 0 refills | Status: DC

## 2022-03-22 NOTE — Progress Notes (Addendum)
Virtual Visit via Telephone Note  I connected with [name] on 03/22/22 at  4:10 PM EST by telephone and verified that I am speaking with the correct person using two identifiers.  Location: Patient: work Provider: Haskel Khan   F/u after IUD check u/s - no IUD identified     TELEHEALTH GYNECOLOGY VISIT ENCOUNTER NOTE  Provider location: [facility] [employer] [facility]   Patient location: Home  I connected with [name] on 03/22/22 at  4:10 PM EST by telephone and verified that I am speaking with the correct person using two identifiers. Patient was unable to do MyChart audiovisual encounter due to technical difficulties, she tried several times.    I discussed the limitations, risks, security and privacy concerns of performing an evaluation and management service by telephone and the availability of in person appointments. I also discussed with the patient that there may be a patient responsible charge related to this service. The patient expressed understanding and agreed to proceed.   History:  [name] is a 42 y.o. [phone] female being evaluated today for results of ultrasound for IUD position. She denies any abnormal vaginal discharge, bleeding, pelvic pain or other concerns.       Past Medical History:  Diagnosis Date   Anxiety    no meds   Asthma    "very badly" (01/17/2012)   Cervical cancer (HCC) 1999; 2000   CHF (congestive heart failure) (HCC) 2006   Resolved -History"with 2006 pregnancy, toxcemia "  not seeing a cardiologist now.   Chronic back pain    Chronic bronchitis (HCC)    "@ least once/yr" (01/17/2012) - none since 01/2012   Complication of anesthesia    back surgeries x 6 - fusions T8-S2- rods   Degenerative disk disease    Difficult intubation    "needs small tube"   Family history of anesthesia complication    Nausea   GERD (gastroesophageal reflux disease)    Migraines    last migraine 09/2013   Neuromuscular  disorder (HCC)    sciatica  - affecting left leg numb/pain   Pneumonia    "~ once/yr" (01/17/2012) none since 01/2012   PONV (postoperative nausea and vomiting)    Respiratory failure (HCC) 11/2010; 09/2011   Resolved  - History pneumonia   Past Surgical History:  Procedure Laterality Date   ADENOIDECTOMY, TONSILLECTOMY AND MYRINGOTOMY WITH TUBE PLACEMENT  1983?   ANTERIOR LUMBAR FUSION  01/17/2012   Procedure: ANTERIOR LUMBAR FUSION 2 LEVELS;  Surgeon: Mat Carne, MD;  Location: [facility] OR;  Service: Orthopedics;  Laterality: Bilateral;  Anterior Lumbar 3-4, Lumbar 4-5 fusion with bone graft harvest   ANTERIOR LUMBAR FUSION     BACK SURGERY  02/19/2014   CERVICAL CONE BIOPSY  1999; 2000   cervical cancer   CESAREAN SECTION N/A 11/08/2013   Procedure: REPEAT CESAREAN SECTION;  Surgeon: Brock Bad, MD;  Location: [facility] ORS;  Service: Obstetrics;  Laterality: N/A;   KNEE ARTHROSCOPY  2004 X 2   "left X 2" (01/17/2012)   LUMBAR DISC SURGERY  06/1998   "L4-5" (01/17/2012)   LUMBAR FUSION  08/1998; 2010; 2012; 06/2011, 02/19/14   "fusion cage; put rods in; reanchored screws; rod broke" (01/17/2012); Had son in 10/2013 and rods in back broke.    SPINAL FUSION  10/04/2011   Procedure: FUSION POSTERIOR SPINAL MULTILEVEL;  Surgeon: Mat Carne, MD;  Location: [facility] OR;  Service: Orthopedics;  Laterality: Bilateral;  repair  lumbar pseudoarthritis, revision segmental fixation, L3-L4, L4-L5   SPINAL FUSION  07/06/2015   T7 - S2   TONSILLECTOMY     The following portions of the patient's history were reviewed and updated as appropriate: allergies, current medications, past family history, past medical history, past social history, past surgical history and problem list.   Health Maintenance:  Normal pap and negative HRHPV on 02-23-2022.    Review of Systems:  Pertinent items noted in HPI and remainder of comprehensive ROS otherwise negative.  Physical Exam:   General:  Alert, oriented and  cooperative.   Mental Status: Normal mood and affect perceived. Normal judgment and thought content.  Physical exam deferred due to nature of the encounter  Labs and Imaging No results found for this or any previous visit (from the past 336 hour(s)). US PELVIC COMPLETE WITH TRANSVAGINAL  Result Date: 03/16/2022 CLINICAL DATA:  Evaluate IUD string location EXAM: TRANSABDOMINAL AND TRANSVAGINAL ULTRASOUND OF PELVIS TECHNIQUE: Both transabdominal and transvaginal ultrasound examinations of the pelvis were performed. Transabdominal technique was performed for global imaging of the pelvis including uterus, ovaries, adnexal regions, and pelvic cul-de-sac. It was necessary to proceed with endovaginal exam following the transabdominal exam to visualize the endometrium and ovaries. COMPARISON:  None Available. FINDINGS: Uterus Measurements: 8.4 x 4.1 x 4.8 cm = volume: 86 mL. Nabothian cysts are identified, of no significance. Endometrium Thickness: 7 mm.  No focal abnormality visualized. Right ovary Measurements: 1.5 x 1.9 x 2.0 cm = volume: 2.9 mL. Normal appearance/no adnexal mass. Left ovary Measurements: 1.9 x 2.8 x 1.8 cm = volume: 5.1 mL. Normal appearance/no adnexal mass. Other findings Trace fluid in the cul-de-sac is likely physiologic. IMPRESSION: 1. The endometrium is normal in thickness. No IUD is identified on today's study. 2. The uterus and ovaries are normal in appearance. 3. Trace fluid in the cul-de-sac is likely physiologic. Electronically Signed   By: Gerome Sam III M.D.   On: 03/16/2022 18:43        US PELVIC COMPLETE WITH TRANSVAGINAL (Accession [id number]) (Order [id number]) Imaging Date: 03/16/2022 Department: [facility] Released By: Zada Finders Authorizing: Brock Bad, MD   Exam Status  Status  Final [99]   PACS Intelerad Image Link   Show images for US PELVIC COMPLETE WITH TRANSVAGINAL Addendum  ADDENDUM REPORT: 03/23/2022 12:15    ADDENDUM: This is an addendum to the previous report. An IUD is clearly identified within the endometrial canal, in appropriate position. The superior aspect of the IUD is within the fundal endometrium and the remainder of the IUD extends inferiorly into the body.     Electronically Signed   By: Gerome Sam III M.D.   On: 03/23/2022 12:15    Addended by Waldo Laine, MD on 03/23/2022 12:18 PM     Assessment and Plan:     1. IUD (intrauterine device) in place  2. Upper respiratory tract infection, unspecified type Rx: - azithromycin (ZITHROMAX Z-PAK) 250 MG tablet; Take 2 tabs po load, then 1 tab po daily.  Dispense: 6 each; Refill: 0       I discussed the assessment and treatment plan with the patient. The patient was provided an opportunity to ask questions and all were answered. The patient agreed with the plan and demonstrated an understanding of the instructions.   The patient was advised to call back or seek an in-person evaluation/go to the ED if the symptoms worsen or if the condition fails to improve  as anticipated.  I have spent a total of 10 minutes of non-face-to-face time, excluding clinical staff time, reviewing notes and preparing to see patient, ordering tests and/or medications, and counseling the patient.    Coral Ceo, MD [facility], [facility], [place] 03/22/2022

## 2022-03-23 ENCOUNTER — Encounter: Payer: Self-pay | Admitting: Obstetrics

## 2022-03-28 ENCOUNTER — Ambulatory Visit (INDEPENDENT_AMBULATORY_CARE_PROVIDER_SITE_OTHER): Payer: Medicare HMO | Admitting: Podiatry

## 2022-03-28 ENCOUNTER — Ambulatory Visit (INDEPENDENT_AMBULATORY_CARE_PROVIDER_SITE_OTHER): Payer: Medicare HMO

## 2022-03-28 DIAGNOSIS — Z79899 Other long term (current) drug therapy: Secondary | ICD-10-CM

## 2022-03-28 DIAGNOSIS — B351 Tinea unguium: Secondary | ICD-10-CM

## 2022-03-28 DIAGNOSIS — S92402B Displaced unspecified fracture of left great toe, initial encounter for open fracture: Secondary | ICD-10-CM

## 2022-03-28 DIAGNOSIS — M722 Plantar fascial fibromatosis: Secondary | ICD-10-CM

## 2022-03-28 DIAGNOSIS — M2170 Unequal limb length (acquired), unspecified site: Secondary | ICD-10-CM | POA: Diagnosis not present

## 2022-03-28 DIAGNOSIS — S92535A Nondisplaced fracture of distal phalanx of left lesser toe(s), initial encounter for closed fracture: Secondary | ICD-10-CM | POA: Diagnosis not present

## 2022-03-28 MED ORDER — AMMONIUM LACTATE 12 % EX CREA: 1.0000 | TOPICAL_CREAM | CUTANEOUS | 0 refills | Status: DC | PRN

## 2022-03-28 NOTE — Progress Notes (Signed)
Subjective: Chief Complaint  Patient presents with   Foot Problem    Left foot, 4th digit, patient dropped something on her foot , 2 weeks ago     DOI 03/16/2022- Dropped a propane tank on the toe  She does report pain to the left fourth toe.  No other areas of tenderness.  Left leg is shorter and asking for inserts to help with back pain.  She also has toenail fungus and she is interested in treatment for this.  He has no drainage or pus.  No open lesions.  Objective: AAO x3, NAD DP/PT pulses palpable bilaterally, CRT less than 3 seconds Tenderness palpation of the left fourth toe.  There is slight edema with no erythema or warmth.  There is no drainage or pus.  No open lesions.  Toe is rectus. The nails mildly hypertrophic and dystrophic with yellow, brown discoloration.  No edema, erythema or signs of infection.  No open lesions. Mild limited discrepancy left leg shorter than right.  Left side about half centimeter shorter. Dry skin present any skin fissures or open sores. No pain with calf compression, swelling, warmth, erythema  Assessment: 42 year old female with contusion left fourth toe, onychomycosis, vomiting discrepancy  Plan: -All treatment options discussed with the patient including all alternatives, risks, complications.  -X-rays were obtained and reviewed.  3 views of the foot were obtained.  [employer] noted distal phalanx likely from edema.  No obvious fracture noted otherwise. -Discussed placement in the toes to help.  Discussed differential shoes -AmLactin for dry skin -Discussed treatment options for nail fungus including oral, topical as well as alternative treatments.  At this time the patient wants to proceed with oral Lamisil.  We discussed side effects of the medication and success rates.  We will check a CBC and LFT prior to starting the medication.  Once I receive the results of this I will then call the medication in.  -Prescription for inserts given for  [employer].  -Patient encouraged to call the office with any questions, concerns, change in symptoms.   Trula Slade DPM

## 2022-03-29 LAB — COMPLETE METABOLIC PANEL WITH GFR
AG Ratio: 1.7 (calc) (ref 1.0–2.5)
ALT: 14 U/L (ref 6–29)
AST: 18 U/L (ref 10–30)
Albumin: 4.3 g/dL (ref 3.6–5.1)
Alkaline phosphatase (APISO): 43 U/L (ref 31–125)
BUN: 8 mg/dL (ref 7–25)
CO2: 27 mmol/L (ref 20–32)
Calcium: 9.4 mg/dL (ref 8.6–10.2)
Chloride: 104 mmol/L (ref 98–110)
Creat: 0.86 mg/dL (ref 0.50–0.99)
Globulin: 2.6 g/dL (calc) (ref 1.9–3.7)
Glucose, Bld: 63 mg/dL — ABNORMAL LOW (ref 65–99)
Potassium: 4.1 mmol/L (ref 3.5–5.3)
Sodium: 139 mmol/L (ref 135–146)
Total Bilirubin: 0.3 mg/dL (ref 0.2–1.2)
Total Protein: 6.9 g/dL (ref 6.1–8.1)
eGFR: 85 mL/min/{1.73_m2} (ref >=60)

## 2022-03-29 LAB — CBC WITH DIFFERENTIAL/PLATELET
Absolute Monocytes: 407 cells/uL (ref 200–950)
Basophils Absolute: 21 cells/uL (ref 0–200)
Basophils Relative: 0.3 %
Eosinophils Absolute: 380 cells/uL (ref 15–500)
Eosinophils Relative: 5.5 %
HCT: 42 % (ref 35.0–45.0)
Hemoglobin: 14.3 g/dL (ref 11.7–15.5)
Lymphs Abs: 1822 cells/uL (ref 850–3900)
MCH: 32.2 pg (ref 27.0–33.0)
MCHC: 34 g/dL (ref 32.0–36.0)
MCV: 94.6 fL (ref 80.0–100.0)
MPV: 9.6 fL (ref 7.5–12.5)
Monocytes Relative: 5.9 %
Neutro Abs: 4271 cells/uL (ref 1500–7800)
Neutrophils Relative %: 61.9 %
Platelets: 285 10*3/uL (ref 140–400)
RBC: 4.44 10*6/uL (ref 3.80–5.10)
RDW: 11.7 % (ref 11.0–15.0)
Total Lymphocyte: 26.4 %
WBC: 6.9 10*3/uL (ref 3.8–10.8)

## 2022-04-01 ENCOUNTER — Other Ambulatory Visit: Payer: Self-pay | Admitting: Podiatry

## 2022-04-01 DIAGNOSIS — Z79899 Other long term (current) drug therapy: Secondary | ICD-10-CM

## 2022-04-01 MED ORDER — TERBINAFINE HCL 250 MG PO TABS: 250.0000 mg | ORAL_TABLET | Freq: Every day | ORAL | 0 refills | Status: AC

## 2022-04-19 DIAGNOSIS — Z981 Arthrodesis status: Secondary | ICD-10-CM | POA: Diagnosis not present

## 2022-04-19 DIAGNOSIS — M419 Scoliosis, unspecified: Secondary | ICD-10-CM | POA: Diagnosis not present

## 2022-04-21 ENCOUNTER — Ambulatory Visit: Payer: Medicare HMO

## 2022-05-11 ENCOUNTER — Other Ambulatory Visit: Payer: Self-pay | Admitting: Obstetrics

## 2022-05-11 DIAGNOSIS — J069 Acute upper respiratory infection, unspecified: Secondary | ICD-10-CM

## 2022-05-12 ENCOUNTER — Telehealth: Payer: Self-pay

## 2022-05-12 NOTE — Telephone Encounter (Signed)
Returned call no answer, vm full

## 2022-05-31 ENCOUNTER — Encounter: Payer: Self-pay | Admitting: *Deleted

## 2022-06-10 LAB — LAB REPORT - SCANNED
A1c: 5.4
EGFR (Non-African Amer.): 103

## 2022-06-15 NOTE — Progress Notes (Unsigned)
   Established Patient Office Visit  Subjective   Patient ID: [name], female    DOB: 1980/03/20  Age: 42 y.o. MRN: [id number]  No chief complaint on file.   HPI [name] is a 42 y.o. female presenting today for follow up of ***.  ROS Negative unless otherwise noted in HPI   Objective:     There were no vitals taken for this visit.  Physical Exam   No results found for any visits on 06/16/22.  {Labs (Optional):23779}  The ASCVD Risk score (Arnett DK, et al., 2019) failed to calculate for the following reasons:   Cannot find a previous HDL lab   Cannot find a previous total cholesterol lab    Assessment & Plan:  There are no diagnoses linked to this encounter.  No follow-ups on file.    [name], PA

## 2022-06-16 ENCOUNTER — Encounter: Payer: Self-pay | Admitting: Family Medicine

## 2022-06-16 ENCOUNTER — Ambulatory Visit (INDEPENDENT_AMBULATORY_CARE_PROVIDER_SITE_OTHER): Payer: Medicare HMO | Admitting: Family Medicine

## 2022-06-16 ENCOUNTER — Other Ambulatory Visit: Payer: Self-pay | Admitting: Family Medicine

## 2022-06-16 VITALS — BP 119/75 | HR 77 | Resp 18 | Ht 61.5 in | Wt 173.0 lb

## 2022-06-16 DIAGNOSIS — J4540 Moderate persistent asthma, uncomplicated: Secondary | ICD-10-CM

## 2022-06-16 DIAGNOSIS — I714 Abdominal aortic aneurysm, without rupture, unspecified: Secondary | ICD-10-CM | POA: Diagnosis not present

## 2022-06-16 DIAGNOSIS — J4520 Mild intermittent asthma, uncomplicated: Secondary | ICD-10-CM

## 2022-06-16 DIAGNOSIS — E559 Vitamin D deficiency, unspecified: Secondary | ICD-10-CM

## 2022-06-16 DIAGNOSIS — K219 Gastro-esophageal reflux disease without esophagitis: Secondary | ICD-10-CM

## 2022-06-16 DIAGNOSIS — G5603 Carpal tunnel syndrome, bilateral upper limbs: Secondary | ICD-10-CM

## 2022-06-16 DIAGNOSIS — G43809 Other migraine, not intractable, without status migrainosus: Secondary | ICD-10-CM

## 2022-06-16 DIAGNOSIS — I509 Heart failure, unspecified: Secondary | ICD-10-CM

## 2022-06-16 DIAGNOSIS — M533 Sacrococcygeal disorders, not elsewhere classified: Secondary | ICD-10-CM

## 2022-06-16 MED ORDER — VITAMIN D (ERGOCALCIFEROL) 1.25 MG (50000 UNIT) PO CAPS
50000.0000 [IU] | ORAL_CAPSULE | ORAL | 0 refills | Status: DC
Start: 2022-06-16 — End: 2023-04-04

## 2022-06-16 MED ORDER — OMEPRAZOLE 40 MG PO CPDR: 40.0000 mg | DELAYED_RELEASE_CAPSULE | Freq: Every day | ORAL | 1 refills | Status: AC

## 2022-06-16 MED ORDER — ONDANSETRON HCL 4 MG PO TABS: 4.0000 mg | ORAL_TABLET | Freq: Three times a day (TID) | ORAL | 0 refills | Status: AC | PRN

## 2022-06-16 MED ORDER — NURTEC 75 MG PO TBDP
ORAL_TABLET | ORAL | 1 refills | Status: DC
Start: 2022-06-16 — End: 2023-06-09

## 2022-06-16 MED ORDER — LORATADINE 10 MG PO TABS
10.0000 mg | ORAL_TABLET | Freq: Every day | ORAL | 11 refills | Status: AC
Start: 2022-06-16 — End: ?

## 2022-06-16 MED ORDER — ALBUTEROL SULFATE HFA 108 (90 BASE) MCG/ACT IN AERS
INHALATION_SPRAY | RESPIRATORY_TRACT | 5 refills | Status: DC
Start: 2022-06-16 — End: 2023-01-03

## 2022-06-16 NOTE — Assessment & Plan Note (Signed)
Stable, lungs clear to auscultation bilaterally today.  Continue albuterol inhaler as needed for rescue.  If asthma is not well-controlled, may consider switching treatment to ICS-LABA such as Dulera.

## 2022-06-16 NOTE — Assessment & Plan Note (Signed)
Patient has tried and failed Ubrelvy and Imitrex.  Trial Nurtec 75 mg sample was successful.  Sending prescription for Nurtec 75 mg for migraines.  Will continue to monitor.

## 2022-06-16 NOTE — Assessment & Plan Note (Signed)
Patient has not seen cardiology in several years.  Blood pressure stable today 119/75, but recommend reestablishing with cardiology.

## 2022-06-16 NOTE — Assessment & Plan Note (Signed)
Has not seen cardiology in several years.

## 2022-06-16 NOTE — Assessment & Plan Note (Signed)
Starting prescription of vitamin D 50,000 units weekly for 12 weeks.  Will recheck vitamin D levels at her annual physical, switch to over-the-counter vitamin D 2000 units daily if back within normal limits for maintenance.

## 2022-06-23 ENCOUNTER — Telehealth: Payer: Self-pay | Admitting: *Deleted

## 2022-06-23 NOTE — Telephone Encounter (Signed)
Tried to contact pt to let her know that the referral that was placed for her carpal tunnel has tried to contact her unsucessfully.  I tried to call and was unable to LVM due to being full.  If she calls back please provide her with the below information and she can call and schedule.     Aldean Baker    if an appointment is still needed please have the patient contact our office at [phone].

## 2022-06-27 ENCOUNTER — Ambulatory Visit: Payer: Medicare HMO | Admitting: Podiatry

## 2022-06-28 ENCOUNTER — Ambulatory Visit: Payer: Medicare HMO | Admitting: Podiatry

## 2022-08-31 ENCOUNTER — Other Ambulatory Visit: Payer: Self-pay

## 2022-08-31 DIAGNOSIS — Z130 Encounter for screening for diseases of the blood and blood-forming organs and certain disorders involving the immune mechanism: Secondary | ICD-10-CM

## 2022-08-31 DIAGNOSIS — E559 Vitamin D deficiency, unspecified: Secondary | ICD-10-CM

## 2022-08-31 DIAGNOSIS — Z0000 Encounter for general adult medical examination without abnormal findings: Secondary | ICD-10-CM

## 2022-09-14 ENCOUNTER — Other Ambulatory Visit: Payer: Medicare HMO

## 2022-09-22 ENCOUNTER — Encounter: Payer: Medicare HMO | Admitting: Family Medicine

## 2022-12-29 ENCOUNTER — Other Ambulatory Visit: Payer: Medicare HMO

## 2023-01-03 ENCOUNTER — Encounter: Payer: Medicare HMO | Admitting: Family Medicine

## 2023-01-03 ENCOUNTER — Other Ambulatory Visit: Payer: Self-pay | Admitting: Family Medicine

## 2023-01-03 DIAGNOSIS — J4520 Mild intermittent asthma, uncomplicated: Secondary | ICD-10-CM

## 2023-01-03 MED ORDER — ALBUTEROL SULFATE HFA 108 (90 BASE) MCG/ACT IN AERS
INHALATION_SPRAY | RESPIRATORY_TRACT | 5 refills | Status: AC
Start: 2023-01-03 — End: ?

## 2023-01-09 ENCOUNTER — Telehealth: Payer: Self-pay

## 2023-01-09 NOTE — Telephone Encounter (Signed)
Copied from CRM [id number]. Topic: Appointments - Appointment Scheduling >> Jan 09, 2023  2:29 PM Fonda Kinder J wrote: Patient/patient representative is calling to schedule an appointment. Refer to attachments for appointment information.

## 2023-02-09 ENCOUNTER — Telehealth: Payer: Self-pay

## 2023-02-09 NOTE — Telephone Encounter (Signed)
Called to reschedule appt provider will not be in the office the afternoon of Tuesday or Thursday  Reschedule appointment 02/23/23 4:10

## 2023-02-09 NOTE — Telephone Encounter (Signed)
Spoke with pt when trying to reschedule her son appt. Pt requested to come in at the same time as her son. I offered a couple of appt times but Pt states that she will start looking for a different provider bc every time she needs to be seen she has to wait or the times available is just not convenient for her.   I proceeded to schedule the son appt but she just canceled hers for now.

## 2023-02-23 ENCOUNTER — Ambulatory Visit: Payer: Medicare HMO

## 2023-02-23 ENCOUNTER — Encounter: Payer: Medicare HMO | Admitting: Family Medicine

## 2023-02-23 DIAGNOSIS — Z1231 Encounter for screening mammogram for malignant neoplasm of breast: Secondary | ICD-10-CM

## 2023-02-23 DIAGNOSIS — Z0000 Encounter for general adult medical examination without abnormal findings: Secondary | ICD-10-CM | POA: Diagnosis not present

## 2023-02-23 NOTE — Progress Notes (Signed)
 Subjective:   [name] is a 43 y.o. female who presents for Medicare Annual (Subsequent) preventive examination.  Visit Complete: Virtual I connected with  [name] on 02/23/23 by a audio enabled telemedicine application and verified that I am speaking with the correct person using two identifiers.  Interactive audio and video telecommunications were attempted between this provider and patient, however failed, due to patient having technical difficulties OR patient did not have access to video capability.  We continued and completed visit with audio only.  Patient Location: Home  Provider Location: Office/Clinic  I discussed the limitations of evaluation and management by telemedicine. The patient expressed understanding and agreed to proceed.  Vital Signs: Because this visit was a virtual/telehealth visit, some criteria may be missing or patient reported. Any vitals not documented were not able to be obtained and vitals that have been documented are patient reported.    Cardiac Risk Factors include: none     Objective:    Today's Vitals   02/23/23 1456  PainSc: 6    There is no height or weight on file to calculate BMI.     02/23/2023    3:08 PM 07/12/2016    4:27 PM 01/25/2016    2:05 PM 10/17/2014    3:36 PM 10/10/2014   11:28 AM 12/12/2013   11:17 AM 11/06/2013   10:29 AM  Advanced Directives  Does Patient Have a Medical Advance Directive? No No No No No No No  Would patient like information on creating a medical advance directive? No - Patient declined  No - Patient declined No - patient declined information No - patient declined information No - patient declined information No - patient declined information    Current Medications (verified) Outpatient Encounter Medications as of 02/23/2023  Medication Sig   albuterol  (VENTOLIN  HFA) 108 (90 Base) MCG/ACT inhaler TAKE 2 PUFFS BY MOUTH EVERY 6 HOURS AS NEEDED FOR WHEEZE OR SHORTNESS OF BREATH    DIAZEPAM  PO Take 10 mg by mouth daily as needed (for muscle pain---patient reported).   ibuprofen  (ADVIL ) 800 MG tablet Take 1 tablet (800 mg total) by mouth every 8 (eight) hours as needed.   loratadine  (CLARITIN ) 10 MG tablet Take 1 tablet (10 mg total) by mouth daily.   omeprazole  (PRILOSEC) 40 MG capsule Take 1 capsule (40 mg total) by mouth daily.   ondansetron  (ZOFRAN ) 4 MG tablet Take 1 tablet (4 mg total) by mouth every 8 (eight) hours as needed for nausea or vomiting.   oxyCODONE  (ROXICODONE ) 15 MG immediate release tablet Take 15 mg by mouth every 4 (four) hours as needed for pain.   Rimegepant Sulfate (NURTEC) 75 MG TBDP Dissolve 1 tablet on tongue as needed for acute headache. Max dose 75 mg/day.   Vitamin D , Ergocalciferol , (DRISDOL ) 1.25 MG (50000 UNIT) CAPS capsule Take 1 capsule (50,000 Units total) by mouth every 7 (seven) days.   terbinafine  (LAMISIL ) 250 MG tablet Take 1 tablet (250 mg total) by mouth daily. (Patient not taking: Reported on 02/23/2023)   No facility-administered encounter medications on file as of 02/23/2023.    Allergies (verified) Iodine and Other   History: Past Medical History:  Diagnosis Date   Anxiety    no meds   Asthma    very badly (01/17/2012)   Cervical cancer (HCC) 1999; 2000   CHF (congestive heart failure) (HCC) 2006   Resolved -Historywith 2006 pregnancy, toxcemia   not seeing a cardiologist now.   Chronic back pain  Chronic bronchitis (HCC)    @ least once/yr (01/17/2012) - none since 01/2012   Complication of anesthesia    back surgeries x 6 - fusions T8-S2- rods   Degenerative disk disease    Difficult intubation    needs small tube   Family history of anesthesia complication    Nausea   GERD (gastroesophageal reflux disease)    Migraines    last migraine 09/2013   Neuromuscular disorder (HCC)    sciatica  - affecting left leg numb/pain   Pneumonia    ~ once/yr (01/17/2012) none since 01/2012   PONV (postoperative  nausea and vomiting)    Respiratory failure (HCC) 11/2010; 09/2011   Resolved  - History pneumonia   Past Surgical History:  Procedure Laterality Date   ADENOIDECTOMY, TONSILLECTOMY AND MYRINGOTOMY WITH TUBE PLACEMENT  1983?   ANTERIOR LUMBAR FUSION  01/17/2012   Procedure: ANTERIOR LUMBAR FUSION 2 LEVELS;  Surgeon: Lacinda Ozell Mans, MD;  Location[facility] OR;  Service: Orthopedics;  Laterality: Bilateral;  Anterior Lumbar 3-4, Lumbar 4-5 fusion with bone graft harvest   ANTERIOR LUMBAR FUSION     BACK SURGERY  02/19/2014   CERVICAL CONE BIOPSY  1999; 2000   cervical cancer   CESAREAN SECTION N/A 11/08/2013   Procedure: REPEAT CESAREAN SECTION;  Surgeon: Carlin DELENA Centers, MD;  Location: [facility] ORS;  Service: Obstetrics;  Laterality: N/A;   KNEE ARTHROSCOPY  2004 X 2   left X 2 (01/17/2012)   LUMBAR DISC SURGERY  06/1998   L4-5 (01/17/2012)   LUMBAR FUSION  08/1998; 2010; 2012; 06/2011, 02/19/14   fusion cage; put rods in; reanchored screws; rod broke (01/17/2012); Had son in 10/2013 and rods in back broke.    SPINAL FUSION  10/04/2011   Procedure: FUSION POSTERIOR SPINAL MULTILEVEL;  Surgeon: Lacinda Ozell Mans, MD;  Location: [facility] OR;  Service: Orthopedics;  Laterality: Bilateral;  repair lumbar pseudoarthritis, revision segmental fixation, L3-L4, L4-L5   SPINAL FUSION  07/06/2015   T7 - S2   TONSILLECTOMY     Family History  Problem Relation Age of Onset   COPD Mother    Breast cancer Mother    Heart disease Father 53   Hypertension Father    Diabetes Father        ESRD on dialysis   Deep vein thrombosis Father    Lung cancer Father    Cancer Other    Heart attack Sister 76       Died with MI   Drug abuse Brother    Depression Brother    Hyperlipidemia Brother    Hypertension Brother    Social History   Socioeconomic History   Marital status: Married    Spouse name: Not on file   Number of children: 1   Years of education: Not on file   Highest education level: Not on file   Occupational History   Occupation: Disabled  Tobacco Use   Smoking status: Former    Current packs/day: 0.00    Types: Cigarettes    Start date: 02/15/2004    Quit date: 02/15/2019    Years since quitting: 4.0    Passive exposure: Never   Smokeless tobacco: Current   Tobacco comments:    1/4-1/2 PPD  Vaping Use   Vaping status: Never Used  Substance and Sexual Activity   Alcohol  use: No   Drug use: Yes    Types: Oxycodone    Sexual activity: Yes    Partners: Male  Birth control/protection: I.U.D.  Other Topics Concern   Not on file  Social History Narrative   Married, 2 children   disabled   Social Drivers of Corporate Investment Banker Strain: Low Risk  (02/23/2023)   Overall Financial Resource Strain (CARDIA)    Difficulty of Paying Living Expenses: Not hard at all  Food Insecurity: Food Insecurity Present (02/23/2023)   Hunger Vital Sign    Worried About Running Out of Food in the Last Year: Sometimes true    Ran Out of Food in the Last Year: Sometimes true  Transportation Needs: No Transportation Needs (02/23/2023)   PRAPARE - Administrator, Civil Service (Medical): No    Lack of Transportation (Non-Medical): No  Physical Activity: Inactive (02/23/2023)   Exercise Vital Sign    Days of Exercise per Week: 0 days    Minutes of Exercise per Session: 0 min  Stress: No Stress Concern Present (02/23/2023)   [employer] of Occupational Health - Occupational Stress Questionnaire    Feeling of Stress : Not at all  Social Connections: Moderately Integrated (02/23/2023)   Social Connection and Isolation Panel [NHANES]    Frequency of Communication with Friends and Family: More than three times a week    Frequency of Social Gatherings with Friends and Family: Once a week    Attends Religious Services: More than 4 times per year    Active Member of [employer] or Organizations: No    Attends Engineer, Structural: Never    Marital Status: Married    Tobacco  Counseling Ready to quit: Not Answered Counseling given: Not Answered Tobacco comments: 1/4-1/2 PPD   Clinical Intake:  Pre-visit preparation completed: Yes  Pain : 0-10 Pain Score: 6  Pain Type: Chronic pain Pain Location: Back Pain Orientation: Mid Pain Radiating Towards: down left leg Pain Descriptors / Indicators: Aching Pain Onset: More than a month ago Pain Frequency: Constant     Nutritional Risks: None Diabetes: No  How often do you need to have someone help you when you read instructions, pamphlets, or other written materials from your doctor or pharmacy?: 1 - Never  Interpreter Needed?: No  Information entered by :: NAllen LPN   Activities of Daily Living    02/23/2023    2:59 PM  In your present state of health, do you have any difficulty performing the following activities:  Hearing? 0  Vision? 1  Comment blurry with glasses  Difficulty concentrating or making decisions? 0  Walking or climbing stairs? 1  Comment due to back  Dressing or bathing? 0  Doing errands, shopping? 0  Preparing Food and eating ? N  Using the Toilet? N  In the past six months, have you accidently leaked urine? N  Do you have problems with loss of bowel control? N  Managing your Medications? N  Managing your Finances? N  Housekeeping or managing your Housekeeping? N    Patient Care Team: Chandra Toribio POUR, MD as PCP - General (Family Medicine) Rudy Carlin LABOR, MD as Consulting Physician (Obstetrics and Gynecology) Powers, Marsa POUR, MD as Consulting Physician (Neurosurgery) [facility] (Pain Medicine)  Indicate any recent Medical Services you may have received from other than [facility] providers in the past year (date may be approximate).     Assessment:   This is a routine wellness examination for [facility].  Hearing/Vision screen Hearing Screening - Comments:: Slight decrease in left ear Vision Screening - Comments:: Regular eye exams, [facility]   Goals Addressed             This Visit's Progress    Patient Stated       02/23/2023, wants to lose 10 pounds and eat healthier       Depression Screen    02/23/2023    3:12 PM 06/16/2022   11:17 AM 02/23/2022    4:01 PM 09/08/2021    3:23 PM 06/10/2021    9:38 AM 02/10/2021    9:57 AM 11/05/2020    1:25 PM  PHQ 2/9 Scores  PHQ - 2 Score 0 1 0 0 0 0 0  PHQ- 9 Score  3 1 3  0 2 3    Fall Risk    02/23/2023    3:09 PM 06/16/2022   11:18 AM 06/10/2021    9:38 AM 02/10/2021    9:56 AM 11/05/2020    1:25 PM  Fall Risk   Falls in the past year? 1 1 0 1 0  Comment leg gave out      Number falls in past yr: 0 0 0 0 0  Injury with Fall? 0 1 0 0 0  Risk for fall due to : Medication side effect History of fall(s) No Fall Risks No Fall Risks No Fall Risks  Follow up Falls prevention discussed;Falls evaluation completed Falls evaluation completed Falls evaluation completed Falls evaluation completed Falls evaluation completed    MEDICARE RISK AT HOME: Medicare Risk at Home Any stairs in or around the home?: Yes If so, are there any without handrails?: No Home free of loose throw rugs in walkways, pet beds, electrical cords, etc?: Yes Adequate lighting in your home to reduce risk of falls?: Yes Use of a cane, walker or w/c?: Yes (walking stick) Grab bars in the bathroom?: Yes Shower chair or bench in shower?: No Elevated toilet seat or a handicapped toilet?: Yes  TIMED UP AND GO:  Was the test performed?  No    Cognitive Function:    01/25/2016    2:08 PM  MMSE - Mini Mental State Exam  Orientation to time 5  Orientation to Place 5  Registration 3  Attention/ Calculation 5  Recall 3  Language- name 2 objects 2  Language- repeat 1  Language- follow 3 step command 3  Language- read & follow direction 1  Write a sentence 1  Copy design 1  Total score 30        02/23/2023    3:12 PM 11/05/2020    1:14 PM  6CIT Screen  What Year? 0 points 0 points  What month? 0  points 0 points  What time? 0 points 0 points  Count back from 20 0 points 0 points  Months in reverse 0 points 0 points  Repeat phrase 0 points 0 points  Total Score 0 points 0 points    Immunizations Immunization History  Administered Date(s) Administered   Influenza,inj,Quad PF,6+ Mos 11/05/2014, 10/26/2015, 01/13/2017, 01/25/2018, 11/07/2018, 11/05/2020   Influenza-Unspecified 10/15/2013   MMR 11/10/2013   Pneumococcal-Unspecified 11/15/2010   Tdap 10/08/2013    TDAP status: Up to date  Flu Vaccine status: Due, Education has been provided regarding the importance of this vaccine. Advised may receive this vaccine at local pharmacy or Health Dept. Aware to provide a copy of the vaccination record if obtained from local pharmacy or Health Dept. Verbalized acceptance and understanding.  Pneumococcal vaccine status: Up to date  Covid-19 vaccine status: Declined, Education has been provided  regarding the importance of this vaccine but patient still declined. Advised may receive this vaccine at local pharmacy or Health Dept.or vaccine clinic. Aware to provide a copy of the vaccination record if obtained from local pharmacy or Health Dept. Verbalized acceptance and understanding.  Qualifies for Shingles Vaccine? No   Zostavax completed  n/a   Shingrix Completed?: n/a  Screening Tests Health Maintenance  Topic Date Due   MAMMOGRAM  09/06/2016   COVID-19 Vaccine (1) 03/11/2023 (Originally 02/26/1985)   INFLUENZA VACCINE  05/15/2023 (Originally 09/15/2022)   DTaP/Tdap/Td (2 - Td or Tdap) 10/09/2023   Medicare Annual Wellness (AWV)  02/23/2024   Cervical Cancer Screening (HPV/Pap Cotest)  02/24/2027   Hepatitis C Screening  Completed   HIV Screening  Completed   HPV VACCINES  Aged Out    Health Maintenance  Health Maintenance Due  Topic Date Due   MAMMOGRAM  09/06/2016    Colorectal cancer screening: n/a  Mammogram status: Ordered today. Pt provided with contact info and  advised to call to schedule appt.   Bone Density status: n/a  Lung Cancer Screening: (Low Dose CT Chest recommended if Age 39-80 years, 20 pack-year currently smoking OR have quit w/in 15years.) does not qualify.   Lung Cancer Screening Referral: no  Additional Screening:  Hepatitis C Screening: does qualify; Completed 11/05/2020  Vision Screening: Recommended annual ophthalmology exams for early detection of glaucoma and other disorders of the eye. Is the patient up to date with their annual eye exam?  Yes  Who is the provider or what is the name of the office in which the patient attends annual eye exams? [facility] If pt is not established with a provider, would they like to be referred to a provider to establish care? No .   Dental Screening: Recommended annual dental exams for proper oral hygiene  Diabetic Foot Exam: n/a  Community Resource Referral / Chronic Care Management: CRR required this visit?  Yes   CCM required this visit?  No     Plan:     I have personally reviewed and noted the following in the patient's chart:   Medical and social history Use of alcohol , tobacco or illicit drugs  Current medications and supplements including opioid prescriptions. Patient is currently taking opioid prescriptions. Information provided to patient regarding non-opioid alternatives. Patient advised to discuss non-opioid treatment plan with their provider. Functional ability and status Nutritional status Physical activity Advanced directives List of other physicians Hospitalizations, surgeries, and ER visits in previous 12 months Vitals Screenings to include cognitive, depression, and falls Referrals and appointments  In addition, I have reviewed and discussed with patient certain preventive protocols, quality metrics, and best practice recommendations. A written personalized care plan for preventive services as well as general preventive health recommendations were  provided to patient.     Ardella FORBES Dawn, LPN   [id number]   After Visit Summary: (Pick Up) Due to this being a telephonic visit, with patients personalized plan was offered to patient and patient has requested to Pick up at office.  Nurse Notes: none

## 2023-02-23 NOTE — Patient Instructions (Signed)
 [name] , Thank you for taking time to come for your Medicare Wellness Visit. I appreciate your ongoing commitment to your health goals. Please review the following plan we discussed and let me know if I can assist you in the future.   Managing Pain Without Opioids Opioids are strong medicines used to treat moderate to severe pain. For some people, especially those who have long-term (chronic) pain, opioids may not be the best choice for pain management due to: Side effects like nausea, constipation, and sleepiness. The risk of addiction (opioid use disorder). The longer you take opioids, the greater your risk of addiction. Pain that lasts for more than 3 months is called chronic pain. Managing chronic pain usually requires more than one approach and is often provided by a team of health care providers working together (multidisciplinary approach). Pain management may be done at a pain management center or pain clinic. How to manage pain without the use of opioids Use non-opioid medicines Non-opioid medicines for pain may include: Over-the-counter or prescription non-steroidal anti-inflammatory drugs (NSAIDs). These may be the first medicines used for pain. They work well for muscle and bone pain, and they reduce swelling. Acetaminophen . This over-the-counter medicine may work well for milder pain but not swelling. Antidepressants. These may be used to treat chronic pain. A certain type of antidepressant (tricyclics) is often used. These medicines are given in lower doses for pain than when used for depression. Anticonvulsants. These are usually used to treat seizures but may also reduce nerve (neuropathic) pain. Muscle relaxants. These relieve pain caused by sudden muscle tightening (spasms). You may also use a pain medicine that is applied to the skin as a patch, cream, or gel (topical analgesic), such as a numbing medicine. These may cause fewer side effects than medicines taken by  mouth. Do certain therapies as directed Some therapies can help with pain management. They include: Physical therapy. You will do exercises to gain strength and flexibility. A physical therapist may teach you exercises to move and stretch parts of your body that are weak, stiff, or painful. You can learn these exercises at physical therapy visits and practice them at home. Physical therapy may also involve: Massage. Heat wraps or applying heat or cold to affected areas. Electrical signals that interrupt pain signals (transcutaneous electrical nerve stimulation, TENS). Weak lasers that reduce pain and swelling (low-level laser therapy). Signals from your body that help you learn to regulate pain (biofeedback). Occupational therapy. This helps you to learn ways to function at home and work with less pain. Recreational therapy. This involves trying new activities or hobbies, such as a physical activity or drawing. Mental health therapy, including: Cognitive behavioral therapy (CBT). This helps you learn coping skills for dealing with pain. Acceptance and commitment therapy (ACT) to change the way you think and react to pain. Relaxation therapies, including muscle relaxation exercises and mindfulness-based stress reduction. Pain management counseling. This may be individual, family, or group counseling.  Receive medical treatments Medical treatments for pain management include: Nerve block injections. These may include a pain blocker and anti-inflammatory medicines. You may have injections: Near the spine to relieve chronic back or neck pain. Into joints to relieve back or joint pain. Into nerve areas that supply a painful area to relieve body pain. Into muscles (trigger point injections) to relieve some painful muscle conditions. A medical device placed near your spine to help block pain signals and relieve nerve pain or chronic back pain (spinal cord stimulation device). Acupuncture. Follow  these instructions at home Medicines Take over-the-counter and prescription medicines only as told by your health care provider. If you are taking pain medicine, ask your health care providers about possible side effects to watch out for. Do not drive or use heavy machinery while taking prescription opioid pain medicine. Lifestyle  Do not use drugs or alcohol  to reduce pain. If you drink alcohol , limit how much you have to: 0-1 drink a day for women who are not pregnant. 0-2 drinks a day for men. Know how much alcohol  is in a drink. In the [place] one drink equals one 12 oz bottle of beer (355 mL), one 5 oz glass of wine (148 mL), or one 1 oz glass of hard liquor (44 mL). Do not use any products that contain nicotine or tobacco. These products include cigarettes, chewing tobacco, and vaping devices, such as e-cigarettes. If you need help quitting, ask your health care provider. Eat a healthy diet and maintain a healthy weight. Poor diet and excess weight may make pain worse. Eat foods that are high in fiber. These include fresh fruits and vegetables, whole grains, and beans. Limit foods that are high in fat and processed sugars, such as fried and sweet foods. Exercise regularly. Exercise lowers stress and may help relieve pain. Ask your health care provider what activities and exercises are safe for you. If your health care provider approves, join an exercise class that combines movement and stress reduction. Examples include yoga and tai chi. Get enough sleep. Lack of sleep may make pain worse. Lower stress as much as possible. Practice stress reduction techniques as told by your therapist. General instructions Work with all your pain management providers to find the treatments that work best for you. You are an important member of your pain management team. There are many things you can do to reduce pain on your own. Consider joining an online or in-person support group for people who have  chronic pain. Keep all follow-up visits. This is important. Where to find more information You can find more information about managing pain without opioids from: American Academy of Pain Medicine: painmed.org Institute for Chronic Pain: instituteforchronicpain.org American Chronic Pain Association: theacpa.org Contact a health care provider if: You have side effects from pain medicine. Your pain gets worse or does not get better with treatments or home therapy. You are struggling with anxiety or depression. Summary Many types of pain can be managed without opioids. Chronic pain may respond better to pain management without opioids. Pain is best managed when you and a team of health care providers work together. Pain management without opioids may include non-opioid medicines, medical treatments, physical therapy, mental health therapy, and lifestyle changes. Tell your health care providers if your pain gets worse or is not being managed well enough. This information is not intended to replace advice given to you by your health care provider. Make sure you discuss any questions you have with your health care provider. Document Revised: 05/13/2020 Document Reviewed: 05/13/2020 Elsevier Patient Education  2024 Elsevier Inc.   Referrals/Orders/Follow-Ups/Clinician Recommendations: mammogram  You have an order for:  []   2D Mammogram  [x]   3D Mammogram  []   Bone Density     Please call for appointment:  [facility] [address] [place], [place] [address] [phone] [facility] [address] [place], [place] [address] [phone] [facility] [address] [place], [place] [address] [phone] [facility] [place]  Bone Density [address] [place], [place] [address] [phone] [facility] [address] [place], [place] [address] [phone]   Make sure to wear  two-piece clothing.  No lotions, powders, or deodorants the day of the appointment. Make sure to bring picture ID and insurance card.  Bring list of medications you are currently taking including any supplements.   Schedule your [place] screening mammogram through MyChart!   Log into your MyChart account.  Go to 'Visit' (or 'Appointments' if on mobile App) --> Schedule an Appointment  Under 'Select a Reason for Visit' choose the Mammogram Screening option.  Complete the pre-visit questions and select the time and place that best fits your schedule.   This is a list of the screening recommended for you and due dates:  Health Maintenance  Topic Date Due   Mammogram  09/06/2016   COVID-19 Vaccine (1) 03/11/2023*   Flu Shot  05/15/2023*   DTaP/Tdap/Td vaccine (2 - Td or Tdap) 10/09/2023   Medicare Annual Wellness Visit  02/23/2024   Pap with HPV screening  02/24/2027   Hepatitis C Screening  Completed   HIV Screening  Completed   HPV Vaccine  Aged Out  *Topic was postponed. The date shown is not the original due date.    Advanced directives: (ACP Link)Information on Advanced Care Planning can be found at [place]  Secretary of [facility] Advance Health Care Directives Advance Health Care Directives ([email or web address]   Next Medicare Annual Wellness Visit scheduled for next year: Yes  insert Preventive Care attachment Insert FALL PREVENTION attachment if needed

## 2023-02-24 ENCOUNTER — Telehealth: Payer: Self-pay | Admitting: *Deleted

## 2023-02-24 NOTE — Progress Notes (Signed)
 Complex Care Management Note  Care Guide Note 02/24/2023 Name: [name] MRN: [id number] DOB: Jul 03, 1980  [name] is a 43 y.o. year old female who sees Chandra Toribio POUR, MD for primary care. I reached out to [employer] by phone today to offer complex care management services.  [name] was given information about Complex Care Management services today including:   The Complex Care Management services include support from the care team which includes your Nurse Coordinator, Clinical Social Worker, or Pharmacist.  The Complex Care Management team is here to help remove barriers to the health concerns and goals most important to you. Complex Care Management services are voluntary, and the patient may decline or stop services at any time by request to their care team member.   Complex Care Management Consent Status: Patient agreed to services and verbal consent obtained.   Follow up plan:  Telephone appointment with complex care management team member scheduled for:  03/02/23  Encounter Outcome:  Patient Scheduled   [facility] Coordination Care Guide  Direct Dial: [phone]

## 2023-03-02 ENCOUNTER — Ambulatory Visit: Payer: Self-pay | Admitting: Licensed Clinical Social Worker

## 2023-03-02 NOTE — Patient Outreach (Signed)
  Care Coordination   Initial Visit Note   03/02/2023 Name: JAIDIN ASCENCION MRN: [id number] DOB: 1980/08/22  Barbee Cough Ronning is a 43 y.o. year old female who sees [name], MD for primary care. I spoke with  Barbee Cough Geter by phone today.  What matters to the patients health and wellness today?  Food Insecurities    Goals Addressed             This Visit's Progress    Care Coordination Activities       Care Coordination Interventions: Patient stated that she is currently on social security ($856.00 per month) and works part time to where she brings home around $400 every two weeks and her money is stretched. She use to get SS for  her son but now he is 61 y/o  and she no longer gets that and that  her mortgage is $1000 per month.  Patient has applied for SNAP benefits but was told that she make to much with her SS and her part-time job.  Patient has applied for Medicaid and it is pending approval.  SW Educated the patient on the GGFF (Greater Dietitian APP) and provided instructions on how to download the app.  SW educated the patient on the food pantry's in the community and the patient on receiving the list. The SW will mail the Food pantry list to the patient. The patient stated that she has used the Out of the [employer] for food. SW completed the SDOH questions and no other concerns at this time. SW will follow up with patient on 03/17/2023 at 11:30 am        SDOH assessments and interventions completed:  Yes  SDOH Interventions Today    Flowsheet Row Most Recent Value  SDOH Interventions   Food Insecurity Interventions Other (Comment), Community Resources Provided  [GGFF APP amd mailing of Food Pantry list]  Housing Interventions Intervention Not Indicated  Transportation Interventions Intervention Not Indicated  Utilities Interventions Intervention Not Indicated        Care Coordination Interventions:  Yes, provided   Interventions Today    Flowsheet Row Most Recent Value  General Interventions   General Interventions Discussed/Reviewed General Interventions Reviewed, Community Resources  [GGFF App and SW will mail out Food Pantry]        Follow up plan: Follow up call scheduled for 03/17/2023 at 11:30 am     Encounter Outcome:  Patient Visit Completed   Jeanie Cooks, PhD [facility], [facility] Social Worker Direct Dial: [phone]  Fax: [phone]

## 2023-03-02 NOTE — Patient Instructions (Signed)
Visit Information  Thank you for taking time to visit with me today. Please don't hesitate to contact me if I can be of assistance to you.   Following are the goals we discussed today:   Goals Addressed             This Visit's Progress    Care Coordination Activities       Care Coordination Interventions: Patient stated that she is currently on social security ($856.00 per month) and works part time to where she brings home around $400 every two weeks and her money is stretched. She use to get SS for  her son but now he is 15 y/o  and she no longer gets that and that  her mortgage is $1000 per month.  Patient has applied for SNAP benefits but was told that she make to much with her SS and her part-time job.  Patient has applied for Medicaid and it is pending approval.  SW Educated the patient on the GGFF (Greater Dietitian APP) and provided instructions on how to download the app.  SW educated the patient on the food pantry's in the community and the patient on receiving the list. The SW will mail the Food pantry list to the patient. The patient stated that she has used the Out [employer] the [employer] for food. SW completed the SDOH questions and no other concerns at this time. SW will follow up with patient on 03/17/2023 at 11:30 am        Our next appointment is by telephone on 03/17/2023 at 11:30 am  Please call the care guide team at [phone] if you need to cancel or reschedule your appointment.   If you are experiencing a Mental Health or Behavioral Health Crisis or need someone to talk to, please call the Suicide and Crisis Lifeline: 988 go to [facility] [address], [place] [phone]) call 911  The patient verbalized understanding of instructions, educational materials, and care plan provided today and DECLINED offer to receive copy of patient instructions, educational materials, and care plan.   Jeanie Cooks, PhD [facility], [facility] Social Worker Direct Dial: [phone]  Fax: [phone]

## 2023-03-17 ENCOUNTER — Encounter: Payer: Self-pay | Admitting: Licensed Clinical Social Worker

## 2023-03-17 ENCOUNTER — Telehealth: Payer: Self-pay | Admitting: *Deleted

## 2023-03-17 NOTE — Progress Notes (Signed)
Complex Care Management Care Guide Note  03/17/2023 Name: [name] MRN: [id number] DOB: 1980/06/04  [name] is a 43 y.o. year old female who is a primary care patient of [name], MD and is actively engaged with the care management team. I reached out to [employer] by phone today to assist with re-scheduling  with the BSW.  Follow up plan: pt declined to reschedule at this time   Burman Nieves, CMA, Care Guide [facility]  [facility], [facility] Guide Direct Dial: [phone]  Fax: [phone] Website: [email or web address]

## 2023-03-23 ENCOUNTER — Encounter: Payer: Self-pay | Admitting: Family Medicine

## 2023-03-25 ENCOUNTER — Encounter: Payer: Self-pay | Admitting: Family Medicine

## 2023-03-27 ENCOUNTER — Encounter: Payer: Self-pay | Admitting: Family Medicine

## 2023-03-27 ENCOUNTER — Ambulatory Visit (INDEPENDENT_AMBULATORY_CARE_PROVIDER_SITE_OTHER): Payer: Medicare HMO | Admitting: Family Medicine

## 2023-03-27 VITALS — BP 97/66 | HR 82 | Ht 61.5 in | Wt 172.0 lb

## 2023-03-27 DIAGNOSIS — E7800 Pure hypercholesterolemia, unspecified: Secondary | ICD-10-CM

## 2023-03-27 DIAGNOSIS — J4540 Moderate persistent asthma, uncomplicated: Secondary | ICD-10-CM

## 2023-03-27 DIAGNOSIS — Z0000 Encounter for general adult medical examination without abnormal findings: Secondary | ICD-10-CM | POA: Diagnosis not present

## 2023-03-27 DIAGNOSIS — M79644 Pain in right finger(s): Secondary | ICD-10-CM

## 2023-03-27 DIAGNOSIS — L659 Nonscarring hair loss, unspecified: Secondary | ICD-10-CM

## 2023-03-27 DIAGNOSIS — H9191 Unspecified hearing loss, right ear: Secondary | ICD-10-CM

## 2023-03-27 DIAGNOSIS — Z1211 Encounter for screening for malignant neoplasm of colon: Secondary | ICD-10-CM | POA: Diagnosis not present

## 2023-03-27 DIAGNOSIS — Z1212 Encounter for screening for malignant neoplasm of rectum: Secondary | ICD-10-CM

## 2023-03-27 DIAGNOSIS — E559 Vitamin D deficiency, unspecified: Secondary | ICD-10-CM

## 2023-03-27 MED ORDER — AIRSUPRA 90-80 MCG/ACT IN AERO: 2.0000 | INHALATION_SPRAY | RESPIRATORY_TRACT | 3 refills | Status: AC | PRN

## 2023-03-27 MED ORDER — AMMONIUM LACTATE 12 % EX CREA: 1.0000 | TOPICAL_CREAM | CUTANEOUS | 0 refills | Status: DC | PRN

## 2023-03-27 NOTE — Patient Instructions (Addendum)
 It was nice to see you today,  We addressed the following topics today: -I am sending in your AmLactin, and Airsupra  instead of albuterol  for asthma, - I am also sending in a referral for colonoscopy and the mammogram is already in. - I am sending in a referral for audiology test.  Some will call you to schedule this - I do not believe your thumb is broken.  You can use topical cold therapy like ice to help with swelling - Continue with her Imitrex  - For hair loss you can try taking biotin or B vitamin complex supplements.  You can also use Rogaine.  This is used twice a day. Yes I will let you know the results of your lab test when I get them.  Have a great day,  Etha Henle, MD

## 2023-03-27 NOTE — Progress Notes (Signed)
Annual physical  Subjective    Patient ID: [name], female    DOB: [birth year]  Age: 43 y.o. MRN: [id number]  Chief Complaint  Patient presents with   Annual Exam   HPI [name] is a 43 y.o. old female here  for annual exam.   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  Has 2 children ages 84 and 66.  Patient vapes nicotine flavored devices.  Denies recreational drug use.  Has rare alcohol use.  Has a regular diet.  Stretches but exercise is limited due to back pain.  Patient states she recently hurt her right hand at work.  Was putting up a "fixture" and a metal rod hit her on the right thumb.  It is sore and tender.  No numbness.  Has been taking ibuprofen for swelling.  Patient in the past has used ammonium lactate on her hands feet and knees for dry skin associated with eczema.  Does not use steroid cream for this.  Has clobetasol that she occasionally uses for her scalp.  Patient complaining of hair loss going over the past year.  Has tried tea tree oil shampoos.  Has not tried B vitamin supplementation.  Has not tried minoxidil topical.  Patient uses albuterol as needed for asthma.  Has tried Symbicort in the past but it leaves a residue in her mouth that she notices even after rinsing afterwards.  Feels like it is partially beneficial.  Patient sees Dr. Malen Gauze for pain management.  She has had 9 back surgeries in the past.  Also has a leg length discrepancy that exacerbates her pain.  Patient is a history of cervical cancer.  Goes to OB/GYN yearly for Pap smears.  Patient has not had a mammogram recently.  Has not had a colonoscopy ever.  Patient complains of difficulty hearing, feeling words are "jumbled", with tinnitus mostly in the right ear.  Patient has history of migraines and is currently taking Nurtec.  Imitrex did not help in the past.  Has migraines once or twice a week.  Usually  Nurtec is helpful if she catches it early.   The ASCVD Risk score (Arnett DK, et al., 2019) failed to calculate for the following reasons:   Cannot find a previous HDL lab   Cannot find a previous total cholesterol lab  Health Maintenance Due  Topic Date Due   COVID-19 Vaccine (1) Never done   Pneumococcal Vaccine 47-65 Years old (1 of 2 - PCV) 02/26/1984   MAMMOGRAM  09/06/2016   Colonoscopy  Never done      Objective:     BP 97/66   Pulse 82   Ht 5' 1.5" (1.562 m)   Wt 172 lb (78 kg)   SpO2 100%   BMI 31.97 kg/m    Physical Exam General: Alert, oriented HEENT: PERRLA, EOMI CV: Regular Pulmonary: Lungs clear bilaterally MSK: Vertical surgical scar going from mid thorax down to sacrum.  Asymmetry in the length of her legs.  Mild swelling and bruising of the right thumb MCP.  No deformity noted.  Tender to palpation. Psych: Pleasant affect    No results found for any visits on 03/27/23.      Assessment & Plan:   Physical exam, annual  Hearing difficulty of right ear Assessment & Plan: Ear exam normal today.  Will send in audiology evaluation referral.  Orders: -     Ambulatory referral  to Audiology  Encounter for colorectal cancer screening -     Ambulatory referral to Gastroenterology  Hair loss Assessment & Plan: Patient complaining of worsening hair loss over the past year.  Discussed treatment options including topical minoxidil and over-the-counter B vitamin supplementation.  Will check TSH  Orders: -     TSH; Future  Vitamin D deficiency -     VITAMIN D 25 Hydroxy (Vit-D Deficiency, Fractures); Future  Elevated LDL cholesterol level Assessment & Plan: Mildly elevated in the past.  Will check lipid panel today.  Orders: -     Lipid panel; Future  Moderate persistent asthma without complication Assessment & Plan: Patient does not use Symbicort because of issues with tolerance.  Has used albuterol as needed but feels like it is only  partially controlling her symptoms.  Discussed Airsupra.  Patient agreeable to trying this for rescue   Pain of right thumb Assessment & Plan: Pain after being struck by a metal rod on the right metacarpal.  On exam appears bruised but not fractured.  Recommended continued conservative pain management.   Other orders -     Airsupra; Inhale 2 puffs into the lungs every 4 (four) hours as needed.  Dispense: 10.7 g; Refill: 3 -     Ammonium Lactate; Apply 1 Application topically as needed for dry skin.  Dispense: 385 g; Refill: 0     Return in about 1 year (around 03/26/2024) for physical.    Crystal Kitty, MD

## 2023-03-28 DIAGNOSIS — L659 Nonscarring hair loss, unspecified: Secondary | ICD-10-CM | POA: Insufficient documentation

## 2023-03-28 DIAGNOSIS — E7800 Pure hypercholesterolemia, unspecified: Secondary | ICD-10-CM | POA: Insufficient documentation

## 2023-03-28 DIAGNOSIS — M79644 Pain in right finger(s): Secondary | ICD-10-CM | POA: Insufficient documentation

## 2023-03-28 DIAGNOSIS — H9191 Unspecified hearing loss, right ear: Secondary | ICD-10-CM | POA: Insufficient documentation

## 2023-03-28 NOTE — Assessment & Plan Note (Signed)
Pain after being struck by a metal rod on the right metacarpal.  On exam appears bruised but not fractured.  Recommended continued conservative pain management.

## 2023-03-28 NOTE — Assessment & Plan Note (Signed)
Patient complaining of worsening hair loss over the past year.  Discussed treatment options including topical minoxidil and over-the-counter B vitamin supplementation.  Will check TSH

## 2023-03-28 NOTE — Assessment & Plan Note (Signed)
Patient does not use Symbicort because of issues with tolerance.  Has used albuterol as needed but feels like it is only partially controlling her symptoms.  Discussed Airsupra.  Patient agreeable to trying this for rescue

## 2023-03-28 NOTE — Assessment & Plan Note (Signed)
Mildly elevated in the past.  Will check lipid panel today.

## 2023-03-28 NOTE — Assessment & Plan Note (Signed)
Ear exam normal today.  Will send in audiology evaluation referral.

## 2023-04-03 ENCOUNTER — Other Ambulatory Visit: Payer: Medicare HMO

## 2023-04-03 DIAGNOSIS — E7800 Pure hypercholesterolemia, unspecified: Secondary | ICD-10-CM

## 2023-04-03 DIAGNOSIS — L659 Nonscarring hair loss, unspecified: Secondary | ICD-10-CM

## 2023-04-03 DIAGNOSIS — E559 Vitamin D deficiency, unspecified: Secondary | ICD-10-CM

## 2023-04-04 ENCOUNTER — Other Ambulatory Visit: Payer: Self-pay | Admitting: Family Medicine

## 2023-04-04 ENCOUNTER — Encounter: Payer: Self-pay | Admitting: Family Medicine

## 2023-04-04 DIAGNOSIS — E559 Vitamin D deficiency, unspecified: Secondary | ICD-10-CM

## 2023-04-04 LAB — LIPID PANEL
Chol/HDL Ratio: 3.1 {ratio} (ref 0.0–4.4)
Cholesterol, Total: 244 mg/dL — ABNORMAL HIGH (ref 100–199)
HDL: 79 mg/dL (ref >39)
LDL Chol Calc (NIH): 151 mg/dL — ABNORMAL HIGH (ref 0–99)
Triglycerides: 81 mg/dL (ref 0–149)
VLDL Cholesterol Cal: 14 mg/dL (ref 5–40)

## 2023-04-04 LAB — VITAMIN D 25 HYDROXY (VIT D DEFICIENCY, FRACTURES): Vit D, 25-Hydroxy: 17.1 ng/mL — ABNORMAL LOW (ref 30.0–100.0)

## 2023-04-04 LAB — TSH: TSH: 1.26 u[IU]/mL (ref 0.450–4.500)

## 2023-04-04 MED ORDER — VITAMIN D (ERGOCALCIFEROL) 1.25 MG (50000 UNIT) PO CAPS
50000.0000 [IU] | ORAL_CAPSULE | ORAL | 0 refills | Status: DC
Start: 2023-04-04 — End: 2023-11-16

## 2023-04-06 ENCOUNTER — Encounter: Payer: Self-pay | Admitting: Family Medicine

## 2023-04-06 ENCOUNTER — Telehealth: Payer: Medicare HMO | Admitting: Family Medicine

## 2023-04-06 DIAGNOSIS — J111 Influenza due to unidentified influenza virus with other respiratory manifestations: Secondary | ICD-10-CM | POA: Diagnosis not present

## 2023-04-06 MED ORDER — FLUTICASONE PROPIONATE 50 MCG/ACT NA SUSP: 2.0000 | Freq: Every day | NASAL | 6 refills | Status: AC

## 2023-04-06 MED ORDER — BENZONATATE 200 MG PO CAPS
200.0000 mg | ORAL_CAPSULE | Freq: Two times a day (BID) | ORAL | 0 refills | Status: DC | PRN
Start: 2023-04-06 — End: 2023-11-16

## 2023-04-06 MED ORDER — GUAIFENESIN 200 MG PO TABS: 200.0000 mg | ORAL_TABLET | ORAL | 0 refills | Status: AC | PRN

## 2023-04-06 MED ORDER — OSELTAMIVIR PHOSPHATE 75 MG PO CAPS: 75.0000 mg | ORAL_CAPSULE | Freq: Two times a day (BID) | ORAL | 0 refills | Status: DC

## 2023-04-06 NOTE — Progress Notes (Signed)
Virtual Visit via Video Note  I connected with [name] Back on 04/06/23 at  2:10 PM EST by a video enabled telemedicine application and verified that I am speaking with the correct person using two identifiers.  Patient Location: Home Provider Location: Office/clinic  I discussed the limitations, risks, security, and privacy concerns of performing an evaluation and management service by video and the availability of in person appointments. I also discussed with the patient that there may be a patient responsible charge related to this service. The patient expressed understanding and agreed to proceed.    Subjective   Patient ID: [name], female    DOB: 03-11-1980  Age: 43 y.o. MRN: [id number]  Chief Complaint  Patient presents with   Flu Like Symptoms    HPI [name] is a 43 y.o. female presenting today for flu-like symptoms. Symptoms include headache, congestion, cough, fever, and sore throat. Onset of symptoms was 1 day ago, her son and husband have both had similar symptoms and tested positive for influenza within the past week.  She is drinking plenty of fluids. Evaluation to date: none. Treatment to date:  Tylenol for fever/headache, fever 104F improved to 101F .  ROS Negative unless otherwise noted in HPI  Outpatient Medications Prior to Visit  Medication Sig   albuterol (VENTOLIN HFA) 108 (90 Base) MCG/ACT inhaler TAKE 2 PUFFS BY MOUTH EVERY 6 HOURS AS NEEDED FOR WHEEZE OR SHORTNESS OF BREATH   Albuterol-Budesonide (AIRSUPRA) 90-80 MCG/ACT AERO Inhale 2 puffs into the lungs every 4 (four) hours as needed.   ammonium lactate (AMLACTIN) 12 % cream Apply 1 Application topically as needed for dry skin.   diazepam (VALIUM) 10 MG tablet Take by mouth.   DIAZEPAM PO Take 10 mg by mouth daily as needed (for muscle pain---patient reported).   ibuprofen (ADVIL) 800 MG tablet Take 1 tablet (800 mg total) by mouth every 8 (eight) hours as needed.    loratadine (CLARITIN) 10 MG tablet Take 1 tablet (10 mg total) by mouth daily.   omeprazole (PRILOSEC) 40 MG capsule Take 1 capsule (40 mg total) by mouth daily.   oxyCODONE (ROXICODONE) 15 MG immediate release tablet Take 15 mg by mouth every 4 (four) hours as needed for pain.   Rimegepant Sulfate (NURTEC) 75 MG TBDP Dissolve 1 tablet on tongue as needed for acute headache. Max dose 75 mg/day.   ondansetron (ZOFRAN) 4 MG tablet Take 1 tablet (4 mg total) by mouth every 8 (eight) hours as needed for nausea or vomiting. (Patient not taking: Reported on 04/06/2023)   terbinafine (LAMISIL) 250 MG tablet Take 1 tablet (250 mg total) by mouth daily. (Patient not taking: Reported on 02/23/2023)   Vitamin D, Ergocalciferol, (DRISDOL) 1.25 MG (50000 UNIT) CAPS capsule Take 1 capsule (50,000 Units total) by mouth every 7 (seven) days. (Patient not taking: Reported on 04/06/2023)   No facility-administered medications prior to visit.     Objective:     Physical Exam General: Speaking clearly in complete sentences without any shortness of breath.  Alert and oriented x3.  Normal judgment. No apparent acute distress. Patient does appear lethargic and sounds congested.   Assessment & Plan:  Influenza -     Oseltamivir Phosphate; Take 1 capsule (75 mg total) by mouth 2 (two) times daily.  Dispense: 10 capsule; Refill: 0 -     Fluticasone Propionate; Place 2 sprays into both nostrils daily.  Dispense: 16 g; Refill: 6 -  Benzonatate; Take 1 capsule (200 mg total) by mouth 2 (two) times daily as needed for cough.  Dispense: 20 capsule; Refill: 0 -     guaiFENesin; Take 1 tablet (200 mg total) by mouth every 4 (four) hours as needed.  Dispense: 30 tablet; Refill: 0  Continue symptomatic management with Tylenol and OTC medications. Increase hydration compared to baseline. Sending Tamiflu to reduce symptom duration given <24 hours since start. Sending benzonatate, guaifenesin, and flonase for symptomatic relief.  Patient verbalized understanding and is agreeable to this plan.  Return if symptoms worsen or fail to improve.   I discussed the assessment and treatment plan with the patient. The patient was provided an opportunity to ask questions, and all were answered. The patient agreed with the plan and demonstrated an understanding of the instructions.   The patient was advised to call back or seek an in-person evaluation if the symptoms worsen or if the condition fails to improve as anticipated.  The above assessment and management plan was discussed with the patient. The patient verbalized understanding of and has agreed to the management plan.   Crystal Quitter, PA

## 2023-06-01 ENCOUNTER — Other Ambulatory Visit: Payer: Self-pay | Admitting: Podiatry

## 2023-06-09 ENCOUNTER — Other Ambulatory Visit: Payer: Self-pay | Admitting: *Deleted

## 2023-06-09 DIAGNOSIS — G43809 Other migraine, not intractable, without status migrainosus: Secondary | ICD-10-CM

## 2023-06-09 MED ORDER — NURTEC 75 MG PO TBDP: ORAL_TABLET | ORAL | 1 refills | Status: AC

## 2023-10-04 ENCOUNTER — Ambulatory Visit: Admitting: Obstetrics

## 2023-10-19 LAB — CBC: RBC: 4.33 (ref 3.87–5.11)

## 2023-10-19 LAB — CBC AND DIFFERENTIAL
HCT: 44 (ref 36–46)
Hemoglobin: 14.2 (ref 12.0–16.0)
Platelets: 225 K/uL (ref 150–400)

## 2023-11-16 ENCOUNTER — Encounter: Payer: Self-pay | Admitting: Family Medicine

## 2023-11-16 ENCOUNTER — Ambulatory Visit (INDEPENDENT_AMBULATORY_CARE_PROVIDER_SITE_OTHER): Admitting: Family Medicine

## 2023-11-16 VITALS — BP 118/80 | HR 97 | Ht 61.5 in | Wt 172.8 lb

## 2023-11-16 DIAGNOSIS — D72819 Decreased white blood cell count, unspecified: Secondary | ICD-10-CM | POA: Diagnosis not present

## 2023-11-16 DIAGNOSIS — Z23 Encounter for immunization: Secondary | ICD-10-CM | POA: Diagnosis not present

## 2023-11-16 DIAGNOSIS — Z0000 Encounter for general adult medical examination without abnormal findings: Secondary | ICD-10-CM | POA: Insufficient documentation

## 2023-11-16 DIAGNOSIS — S83206A Unspecified tear of unspecified meniscus, current injury, right knee, initial encounter: Secondary | ICD-10-CM | POA: Insufficient documentation

## 2023-11-16 DIAGNOSIS — S83206D Unspecified tear of unspecified meniscus, current injury, right knee, subsequent encounter: Secondary | ICD-10-CM | POA: Diagnosis not present

## 2023-11-16 DIAGNOSIS — J4540 Moderate persistent asthma, uncomplicated: Secondary | ICD-10-CM

## 2023-11-16 NOTE — Progress Notes (Signed)
 Established Patient Office Visit  Subjective   Patient ID: [name], female    DOB: 11/28/1980  Age: 43 y.o. MRN: [id number]  Chief Complaint  Patient presents with   Medical Management of Chronic Issues    HPI  Subjective - Follow up on leukopenia. Reports WBC was 5.9 in March and dropped to 2.3 on labs from 10/19/2023. Previous provider expressed concern for a dramatic drop. Other labs including vitamin D  were reportedly normal. Expresses significant anxiety about the possibility of malignancy. Reports feeling a little bit off lately but attributes this to stress. - Chronic knee pain. Reports an MRI showed torn cartilage and bone spurs. An orthopedic surgeon suggested a partial or total knee replacement due to insufficient cartilage for arthroscopic surgery. Reports significant swelling, especially with activity. Pain and swelling are not relieved by ice, elevation, or a compression sleeve. Has been doing home physical therapy exercises. Reports balance is off, which is exacerbated by the knee pain. Uses a crutch at home for support which improves pain. Also notes a leg length discrepancy of approximately 1.5 inches, previously managed with a shoe lift, but finds current orthotics uncomfortable and expensive. - Onychomycosis. Reports previous treatment with an oral pill (Lamisil ) was ineffective. - Asthma. Reports it is well-controlled.  Medications AirSupra , Ventolin . Reports using Ventolin  more frequently than AirSupra  for acute symptoms. Previously took oral Lamisil  for nail fungus without improvement.  PMH, PSH, FH, Social Hx PMHx: Asthma, onychomycosis, history of cervical cancer (pre-cancerous changes) status post two cone biopsies, leg length discrepancy. PSH: Two cone biopsies, back surgery. Social Hx: Reports significant stress related to husbands(Tim) health issues (hernia).  ROS MSK: Positive for knee pain and swelling. Negative for other joint pain. Psych:  Reports increased stress and anxiety. Denies other mood symptoms, reports being a happy person normally.    The 10-year ASCVD risk score (Arnett DK, et al., 2019) is: 0.5%  Health Maintenance Due  Topic Date Due   COVID-19 Vaccine (1) Never done   Pneumococcal Vaccine (1 of 2 - PCV) 02/25/1997   Hepatitis B Vaccines 19-59 Average Risk (1 of 3 - 19+ 3-dose series) Never done   HPV VACCINES (1 - 3-dose SCDM series) Never done   Mammogram  09/06/2016   Colonoscopy  Never done   DTaP/Tdap/Td (2 - Td or Tdap) 10/09/2023      Objective:     BP 118/80   Pulse 97   Ht 5' 1.5 (1.562 m)   Wt 172 lb 12.8 oz (78.4 kg)   SpO2 98%   BMI 32.12 kg/m    Physical Exam Gen: alert, oriented Pulm: no respiratory distress Psych: pleasant affect   No results found for any visits on 11/16/23.      Assessment & Plan:   Leukopenia, unspecified type Assessment & Plan: Reports recent labs from 10/19/2023 showed WBC of 2.3, a significant decrease from 5.9 in March. Patient is asymptomatic but anxious about the finding. - Repeat CBC to confirm finding and evaluate cell lines. - Counseled that this is most likely a lab error or transient dip and to avoid worry pending further information.  Orders: -     CBC with Differential/Platelet; Future  Tear of meniscus of right knee as current injury, unspecified meniscus, unspecified tear type, subsequent encounter Assessment & Plan: History of torn cartilage and bone spurs on MRI with chronic pain and swelling, worse with ambulation. Prior orthopedic evaluation recommended knee replacement. Home PT and conservative measures provide minimal relief. -  Referral to [facility] for second orthopedic opinion. - Discussed non-surgical options including physical therapy and pain management. - Discussed challenges with orthotics for leg length discrepancy. Advised exploring different shoe types (straps, no laces) with thicker insoles as a lower-cost  alternative to custom shoe lifts. Advised discussing orthotic options with a podiatrist.  Orders: -     Ambulatory referral to Orthopedic Surgery  Encounter for immunization -     Flu vaccine trivalent PF, 6mos and older(Flulaval,Afluria,Fluarix,Fluzone)  Moderate persistent asthma without complication Assessment & Plan: Condition is reportedly stable. Using Ventolin  more than AirSupra . - Continue current medications.   Healthcare maintenance Assessment & Plan: Patient reports upcoming appointments for mammogram, Pap smear, and IUD removal with her gynecologist, Dr. Rudy. - Continue routine health maintenance as planned.      Return for already on file.    Toribio MARLA Slain, MD

## 2023-11-16 NOTE — Assessment & Plan Note (Signed)
 Condition is reportedly stable. Using Ventolin  more than AirSupra . - Continue current medications.

## 2023-11-16 NOTE — Assessment & Plan Note (Signed)
 Patient reports upcoming appointments for mammogram, Pap smear, and IUD removal with her gynecologist, Dr. Rudy. - Continue routine health maintenance as planned.

## 2023-11-16 NOTE — Progress Notes (Signed)
 Pt received flu shot today, and tolerated well.  VIS given

## 2023-11-16 NOTE — Assessment & Plan Note (Signed)
 Reports recent labs from 10/19/2023 showed WBC of 2.3, a significant decrease from 5.9 in March. Patient is asymptomatic but anxious about the finding. - Repeat CBC to confirm finding and evaluate cell lines. - Counseled that this is most likely a lab error or transient dip and to avoid worry pending further information.

## 2023-11-16 NOTE — Patient Instructions (Signed)
 It was nice to see you today,  We addressed the following topics today: -I am ordering a repeat of your CBC.  You can schedule that appointment upfront - I am sending a referral for second opinion to [facility].  If they have not called you in 2 weeks let me know. - I would recommend talking to your foot doctor the next time you see them to see if they will do orthotic fittings.  Have a great day,  Rolan Slain, MD

## 2023-11-16 NOTE — Assessment & Plan Note (Signed)
 History of torn cartilage and bone spurs on MRI with chronic pain and swelling, worse with ambulation. Prior orthopedic evaluation recommended knee replacement. Home PT and conservative measures provide minimal relief. - Referral to [facility] for second orthopedic opinion. - Discussed non-surgical options including physical therapy and pain management. - Discussed challenges with orthotics for leg length discrepancy. Advised exploring different shoe types (straps, no laces) with thicker insoles as a lower-cost alternative to custom shoe lifts. Advised discussing orthotic options with a podiatrist.

## 2023-11-17 ENCOUNTER — Encounter (HOSPITAL_BASED_OUTPATIENT_CLINIC_OR_DEPARTMENT_OTHER): Payer: Self-pay

## 2023-11-20 ENCOUNTER — Ambulatory Visit: Payer: Self-pay

## 2023-11-20 ENCOUNTER — Other Ambulatory Visit

## 2023-11-20 NOTE — Telephone Encounter (Signed)
  Message from [place] [facility] sent at 11/20/2023  9:09 AM EDT    **Sending over to NT because patient sounded a bit distressed on the phone**  Patient stated she is having issues due to previously acknowledged ailments causing her to have issues walking, giving her conflict with the Lab appointment she has today.  Due to this, the patient wanted to know if she would/could schedule for later today?

## 2023-11-20 NOTE — Telephone Encounter (Signed)
 Unable to reach patient for triage x 3 attempts.

## 2023-11-21 ENCOUNTER — Encounter: Payer: Self-pay | Admitting: Family Medicine

## 2023-11-21 ENCOUNTER — Ambulatory Visit: Payer: Self-pay

## 2023-11-21 LAB — CBC WITH DIFFERENTIAL/PLATELET
Basophils Absolute: 0 x10E3/uL (ref 0.0–0.2)
Basos: 0 %
EOS (ABSOLUTE): 0.3 x10E3/uL (ref 0.0–0.4)
Eos: 4 %
Hematocrit: 41.9 % (ref 34.0–46.6)
Hemoglobin: 14.1 g/dL (ref 11.1–15.9)
Immature Grans (Abs): 0 x10E3/uL (ref 0.0–0.1)
Immature Granulocytes: 0 %
Lymphocytes Absolute: 1.1 x10E3/uL (ref 0.7–3.1)
Lymphs: 15 %
MCH: 32.3 pg (ref 26.6–33.0)
MCHC: 33.7 g/dL (ref 31.5–35.7)
MCV: 96 fL (ref 79–97)
Monocytes Absolute: 0.4 x10E3/uL (ref 0.1–0.9)
Monocytes: 6 %
Neutrophils Absolute: 5.3 x10E3/uL (ref 1.4–7.0)
Neutrophils: 75 %
Platelets: 271 x10E3/uL (ref 150–450)
RBC: 4.36 x10E6/uL (ref 3.77–5.28)
RDW: 11.7 % (ref 11.7–15.4)
WBC: 7.1 x10E3/uL (ref 3.4–10.8)

## 2023-11-22 ENCOUNTER — Other Ambulatory Visit

## 2023-12-07 ENCOUNTER — Ambulatory Visit: Admitting: Obstetrics

## 2023-12-12 NOTE — Progress Notes (Signed)
 Appointment cancelled

## 2023-12-12 NOTE — Telephone Encounter (Signed)
 Attempted to reach patient to schedule US  prior to appointment with Dr. Abigail on 10/30. Someone answered the phone and stated that the number on file is an [employer] work phone. Sent patient a mychart message.,

## 2023-12-14 ENCOUNTER — Ambulatory Visit: Admitting: Obstetrics and Gynecology

## 2023-12-18 ENCOUNTER — Telehealth: Payer: Self-pay

## 2023-12-19 ENCOUNTER — Other Ambulatory Visit: Payer: Self-pay

## 2023-12-19 ENCOUNTER — Telehealth: Payer: Self-pay

## 2023-12-19 DIAGNOSIS — Z1211 Encounter for screening for malignant neoplasm of colon: Secondary | ICD-10-CM

## 2023-12-19 DIAGNOSIS — M8580 Other specified disorders of bone density and structure, unspecified site: Secondary | ICD-10-CM

## 2023-12-19 NOTE — Telephone Encounter (Signed)
 I have placed the order for a bone density but she just needs to be aware that insurance may not cover it. The imaging center will call her to get the appointment set up

## 2023-12-19 NOTE — Telephone Encounter (Signed)
 I will place order for Cologuard.   She is not of age yet to qualify for a bone density scan. Insurance won't cover until she is 78.

## 2023-12-19 NOTE — Telephone Encounter (Signed)
 Copied from CRM [id number]. Topic: Clinical - Request for Lab/Test Order >> Dec 18, 2023  2:21 PM Fonda T wrote: Reason for CRM: Patient calling, requesting a bone density scan order, along with a colorguard, due to negative history for colon cancer.   Patient is requesting  return call once orders has been placed, can be reached at [phone].

## 2023-12-20 NOTE — Telephone Encounter (Signed)
 Error

## 2024-02-05 ENCOUNTER — Telehealth: Payer: Self-pay | Admitting: Family Medicine

## 2024-02-05 ENCOUNTER — Other Ambulatory Visit: Payer: Self-pay | Admitting: Family Medicine

## 2024-02-05 MED ORDER — MUPIROCIN 2 % EX OINT: 1.0000 | TOPICAL_OINTMENT | Freq: Two times a day (BID) | CUTANEOUS | 1 refills | Status: AC | PRN

## 2024-02-05 NOTE — Telephone Encounter (Signed)
 Unable to pend

## 2024-02-05 NOTE — Telephone Encounter (Unsigned)
 Copied from CRM [id number]. Topic: Clinical - Medication Refill >> Feb 05, 2024  2:40 PM Lauren C wrote: Medication: mupirocin  ointment (BACTROBAN ) 2 %  Has the patient contacted their pharmacy? No  Pt says this was sent in years ago but she has finally just ran out. She needs this for a rash that she gets in the winter. She says she has mentioned it to Dr. Chandra before.   This is the patient's preferred pharmacy:  [facility] - [place], [place] - [address] [place] [address] Phone: [phone] Fax: [phone]  Is this the correct pharmacy for this prescription? Yes If no, delete pharmacy and type the correct one.   Has the prescription been filled recently? No  Is the patient out of the medication? Yes  Has the patient been seen for an appointment in the last year OR does the patient have an upcoming appointment? Yes  Can we respond through MyChart? Please call [phone]  Agent: Please be advised that Rx refills may take up to 3 business days. We ask that you follow-up with your pharmacy.

## 2024-02-06 ENCOUNTER — Other Ambulatory Visit (HOSPITAL_COMMUNITY): Payer: Self-pay

## 2024-02-06 ENCOUNTER — Telehealth: Payer: Self-pay

## 2024-02-06 NOTE — Telephone Encounter (Signed)
 Pharmacy Patient Advocate Encounter   Received notification from Onbase that prior authorization for NURTEC 75MG  DISPERSIBLE TABLETS is due for renewal.   Insurance verification completed.   The patient is insured through [facility].  Action: The current 30 day co-pay is, $0.00.  No PA needed at this time. This test claim was processed through [facility]- copay amounts may vary at other pharmacies due to pharmacy/plan contracts, or as the patient moves through the different stages of their insurance plan.     SEE TEST CLAIM BELOW

## 2024-03-07 LAB — COLOGUARD: COLOGUARD: NEGATIVE

## 2024-03-08 ENCOUNTER — Ambulatory Visit: Payer: Self-pay

## 2024-03-21 ENCOUNTER — Ambulatory Visit: Payer: Medicare HMO

## 2024-03-27 ENCOUNTER — Encounter: Payer: Medicare HMO | Admitting: Family Medicine
